# Patient Record
Sex: Male | Born: 1985 | Race: White | Hispanic: No | Marital: Married | Smoking: Current every day smoker
Health system: Southern US, Community
[De-identification: ages and names within clinical notes are randomized; demographics above are authoritative.]

## PROBLEM LIST (undated history)

## (undated) DIAGNOSIS — K219 Gastro-esophageal reflux disease without esophagitis: Secondary | ICD-10-CM

## (undated) DIAGNOSIS — F259 Schizoaffective disorder, unspecified: Secondary | ICD-10-CM

## (undated) DIAGNOSIS — F319 Bipolar disorder, unspecified: Secondary | ICD-10-CM

## (undated) DIAGNOSIS — I10 Essential (primary) hypertension: Secondary | ICD-10-CM

## (undated) DIAGNOSIS — Z950 Presence of cardiac pacemaker: Secondary | ICD-10-CM

## (undated) DIAGNOSIS — R001 Bradycardia, unspecified: Secondary | ICD-10-CM

## (undated) HISTORY — PX: INSERT / REPLACE / REMOVE PACEMAKER: SUR710

## (undated) HISTORY — PX: KNEE ARTHROSCOPY: SUR90

---
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
## 2006-07-08 ENCOUNTER — Emergency Department: Payer: Self-pay | Admitting: General Practice

## 2007-01-21 ENCOUNTER — Emergency Department: Payer: Self-pay | Admitting: Emergency Medicine

## 2007-01-23 ENCOUNTER — Emergency Department: Payer: Self-pay | Admitting: Emergency Medicine

## 2008-02-14 ENCOUNTER — Emergency Department: Payer: Self-pay | Admitting: Emergency Medicine

## 2009-10-30 ENCOUNTER — Emergency Department: Payer: Self-pay | Admitting: Emergency Medicine

## 2010-05-15 ENCOUNTER — Emergency Department: Payer: Self-pay | Admitting: Emergency Medicine

## 2010-07-05 ENCOUNTER — Emergency Department: Payer: Self-pay | Admitting: Emergency Medicine

## 2010-07-31 ENCOUNTER — Emergency Department: Payer: Self-pay | Admitting: Emergency Medicine

## 2010-08-10 ENCOUNTER — Emergency Department: Payer: Self-pay | Admitting: Emergency Medicine

## 2010-08-11 ENCOUNTER — Emergency Department: Payer: Self-pay | Admitting: Emergency Medicine

## 2010-08-14 ENCOUNTER — Observation Stay: Payer: Self-pay | Admitting: Internal Medicine

## 2010-11-11 ENCOUNTER — Ambulatory Visit: Payer: Self-pay | Admitting: Internal Medicine

## 2010-12-18 ENCOUNTER — Emergency Department: Payer: Self-pay | Admitting: Emergency Medicine

## 2010-12-21 ENCOUNTER — Emergency Department: Payer: Self-pay | Admitting: Emergency Medicine

## 2011-02-04 ENCOUNTER — Emergency Department: Payer: Self-pay | Admitting: Emergency Medicine

## 2011-02-24 ENCOUNTER — Emergency Department: Payer: Self-pay | Admitting: Internal Medicine

## 2011-02-26 ENCOUNTER — Emergency Department: Payer: Self-pay | Admitting: Emergency Medicine

## 2011-03-03 ENCOUNTER — Emergency Department: Payer: Self-pay | Admitting: Emergency Medicine

## 2011-06-06 ENCOUNTER — Emergency Department: Payer: Self-pay | Admitting: Emergency Medicine

## 2011-06-12 ENCOUNTER — Emergency Department: Payer: Self-pay | Admitting: Emergency Medicine

## 2011-10-04 ENCOUNTER — Emergency Department: Payer: Self-pay | Admitting: Emergency Medicine

## 2011-10-04 LAB — URINALYSIS, COMPLETE
Bacteria: NONE SEEN
Bilirubin,UR: NEGATIVE
Blood: NEGATIVE
Glucose,UR: NEGATIVE mg/dL (ref 0–75)
Ketone: NEGATIVE
Leukocyte Esterase: NEGATIVE
Nitrite: NEGATIVE
Ph: 6 (ref 4.5–8.0)
Protein: NEGATIVE
RBC,UR: NONE SEEN /HPF (ref 0–5)
Specific Gravity: 1.023 (ref 1.003–1.030)
Squamous Epithelial: NONE SEEN
WBC UR: <1 /HPF (ref 0–5)

## 2011-10-07 ENCOUNTER — Emergency Department: Payer: Self-pay | Admitting: Emergency Medicine

## 2011-10-07 LAB — BASIC METABOLIC PANEL
Anion Gap: 12 (ref 7–16)
Calcium, Total: 8.9 mg/dL (ref 8.5–10.1)
Creatinine: 0.92 mg/dL (ref 0.60–1.30)
EGFR (African American): >60
EGFR (Non-African Amer.): >60
Glucose: 93 mg/dL (ref 65–99)
Potassium: 4 mmol/L (ref 3.5–5.1)
Sodium: 144 mmol/L (ref 136–145)

## 2011-10-07 LAB — CBC
HCT: 45.7 % (ref 40.0–52.0)
HGB: 15.5 g/dL (ref 13.0–18.0)
MCHC: 34 g/dL (ref 32.0–36.0)
Platelet: 214 10*3/uL (ref 150–440)
RBC: 5.07 10*6/uL (ref 4.40–5.90)
RDW: 13.4 % (ref 11.5–14.5)
WBC: 12.6 10*3/uL — ABNORMAL HIGH (ref 3.8–10.6)

## 2012-10-05 ENCOUNTER — Emergency Department: Payer: Self-pay | Admitting: Emergency Medicine

## 2012-10-05 LAB — CBC WITH DIFFERENTIAL/PLATELET
Basophil #: 0.1 10*3/uL (ref 0.0–0.1)
Basophil %: 0.5 %
Eosinophil #: 0.5 10*3/uL (ref 0.0–0.7)
HCT: 44.6 % (ref 40.0–52.0)
Lymphocyte #: 2.2 10*3/uL (ref 1.0–3.6)
Lymphocyte %: 17.9 %
MCH: 30.1 pg (ref 26.0–34.0)
MCHC: 34 g/dL (ref 32.0–36.0)
Monocyte %: 3.6 %
RBC: 5.03 10*6/uL (ref 4.40–5.90)
WBC: 12.3 10*3/uL — ABNORMAL HIGH (ref 3.8–10.6)

## 2012-10-05 LAB — BASIC METABOLIC PANEL
Anion Gap: 6 — ABNORMAL LOW (ref 7–16)
Calcium, Total: 8.8 mg/dL (ref 8.5–10.1)
Co2: 26 mmol/L (ref 21–32)
Creatinine: 0.97 mg/dL (ref 0.60–1.30)
EGFR (African American): >60
EGFR (Non-African Amer.): >60
Glucose: 107 mg/dL — ABNORMAL HIGH (ref 65–99)
Osmolality: 279 (ref 275–301)
Potassium: 3.4 mmol/L — ABNORMAL LOW (ref 3.5–5.1)
Sodium: 139 mmol/L (ref 136–145)

## 2013-04-01 ENCOUNTER — Emergency Department: Payer: Self-pay | Admitting: Emergency Medicine

## 2013-04-01 LAB — BASIC METABOLIC PANEL
Anion Gap: 8 (ref 7–16)
BUN: 11 mg/dL (ref 7–18)
Chloride: 107 mmol/L (ref 98–107)
Co2: 24 mmol/L (ref 21–32)
EGFR (African American): >60
Glucose: 105 mg/dL — ABNORMAL HIGH (ref 65–99)
Osmolality: 277 (ref 275–301)
Potassium: 3.7 mmol/L (ref 3.5–5.1)

## 2013-04-01 LAB — CBC
HGB: 15.1 g/dL (ref 13.0–18.0)
MCH: 30.2 pg (ref 26.0–34.0)
MCHC: 34.4 g/dL (ref 32.0–36.0)
MCV: 88 fL (ref 80–100)
Platelet: 251 10*3/uL (ref 150–440)
RBC: 4.99 10*6/uL (ref 4.40–5.90)
WBC: 11.7 10*3/uL — ABNORMAL HIGH (ref 3.8–10.6)

## 2013-04-01 LAB — TROPONIN I: Troponin-I: <0.02 ng/mL

## 2013-10-09 ENCOUNTER — Emergency Department: Payer: Self-pay | Admitting: Emergency Medicine

## 2014-04-30 ENCOUNTER — Emergency Department: Payer: Self-pay | Admitting: Emergency Medicine

## 2014-07-02 ENCOUNTER — Emergency Department: Payer: Self-pay | Admitting: Emergency Medicine

## 2014-12-29 DIAGNOSIS — H109 Unspecified conjunctivitis: Secondary | ICD-10-CM | POA: Insufficient documentation

## 2014-12-29 DIAGNOSIS — Z880 Allergy status to penicillin: Secondary | ICD-10-CM | POA: Insufficient documentation

## 2014-12-29 NOTE — ED Notes (Signed)
Pt presents to er stating right eye pain, denies injury. Pt has redness and tearing noted, denies blurred vision.

## 2014-12-30 ENCOUNTER — Emergency Department
Admission: EM | Admit: 2014-12-30 | Discharge: 2014-12-30 | Disposition: A | Discharge status: Home / Self Care | Payer: Self-pay | Attending: Emergency Medicine | Admitting: Emergency Medicine

## 2014-12-30 DIAGNOSIS — H109 Unspecified conjunctivitis: Secondary | ICD-10-CM

## 2014-12-30 MED ORDER — IBUPROFEN 800 MG PO TABS
800.0000 mg | ORAL_TABLET | Freq: Once | ORAL | Status: AC
  Administered 2014-12-30: 800 mg via ORAL

## 2014-12-30 MED ORDER — TETRACAINE HCL 0.5 % OP SOLN
OPHTHALMIC | Status: AC
  Administered 2014-12-30: 2 [drp] via OPHTHALMIC
  Filled 2014-12-30: qty 2

## 2014-12-30 MED ORDER — IBUPROFEN 800 MG PO TABS
ORAL_TABLET | ORAL | Status: AC
  Administered 2014-12-30: 800 mg via ORAL
  Filled 2014-12-30: qty 1

## 2014-12-30 MED ORDER — OXYCODONE-ACETAMINOPHEN 5-325 MG PO TABS
ORAL_TABLET | ORAL | Status: AC
  Administered 2014-12-30: 1 via ORAL
  Filled 2014-12-30: qty 1

## 2014-12-30 MED ORDER — FLUORESCEIN SODIUM 1 MG OP STRP
1.0000 | ORAL_STRIP | Freq: Once | OPHTHALMIC | Status: AC
  Administered 2014-12-30: 1 via OPHTHALMIC

## 2014-12-30 MED ORDER — IBUPROFEN 800 MG PO TABS: 800.0000 mg | ORAL_TABLET | Freq: Three times a day (TID) | ORAL | Status: DC | PRN

## 2014-12-30 MED ORDER — TOBRAMYCIN 0.3 % OP SOLN
2.0000 [drp] | OPHTHALMIC | Status: DC
  Administered 2014-12-30: 2 [drp] via OPHTHALMIC
  Filled 2014-12-30: qty 5

## 2014-12-30 MED ORDER — TETRACAINE HCL 0.5 % OP SOLN
2.0000 [drp] | Freq: Once | OPHTHALMIC | Status: AC
  Administered 2014-12-30: 2 [drp] via OPHTHALMIC

## 2014-12-30 MED ORDER — FLUORESCEIN SODIUM 1 MG OP STRP
ORAL_STRIP | OPHTHALMIC | Status: AC
  Administered 2014-12-30: 1 via OPHTHALMIC
  Filled 2014-12-30: qty 2

## 2014-12-30 MED ORDER — OXYCODONE-ACETAMINOPHEN 5-325 MG PO TABS: 1.0000 | ORAL_TABLET | ORAL | Status: DC | PRN

## 2014-12-30 MED ORDER — OXYCODONE-ACETAMINOPHEN 5-325 MG PO TABS
1.0000 | ORAL_TABLET | Freq: Once | ORAL | Status: AC
  Administered 2014-12-30: 1 via ORAL

## 2014-12-30 NOTE — Discharge Instructions (Signed)
1. Apply antibiotic eye drops to right eye 2 drops every 4 hours while awake 7 days. 2. Take pain medicines as needed (Motrin/Percocet). 3. Return to the ER for worsening symptoms, persistent vomiting, fever, increased eye drainage or other concerns.  Conjunctivitis Conjunctivitis is commonly called "pink eye." Conjunctivitis can be caused by bacterial or viral infection, allergies, or injuries. There is usually redness of the lining of the eye, itching, discomfort, and sometimes discharge. There may be deposits of matter along the eyelids. A viral infection usually causes a watery discharge, while a bacterial infection causes a yellowish, thick discharge. Pink eye is very contagious and spreads by direct contact. You may be given antibiotic eyedrops as part of your treatment. Before using your eye medicine, remove all drainage from the eye by washing gently with warm water and cotton balls. Continue to use the medication until you have awakened 2 mornings in a row without discharge from the eye. Do not rub your eye. This increases the irritation and helps spread infection. Use separate towels from other household members. Wash your hands with soap and water before and after touching your eyes. Use cold compresses to reduce pain and sunglasses to relieve irritation from light. Do not wear contact lenses or wear eye makeup until the infection is gone. SEEK MEDICAL CARE IF:   Your symptoms are not better after 3 days of treatment.  You have increased pain or trouble seeing.  The outer eyelids become very red or swollen. Document Released: 08/26/2004 Document Revised: 10/11/2011 Document Reviewed: 07/19/2005 [facility] Patient Information 2015 [place] [employer]. This information is not intended to replace advice given to you by your health care provider. Make sure you discuss any questions you have with your health care provider.  Eye Drops Use eye drops as directed. It may be easier to have someone help  you put the drops in your eye. If you are alone, use the following instructions to help you.  Wash your hands before putting drops in your eyes.  Read the label and look at your medication. Check for any expiration date that may appear on the bottle or tube. Changes of color may be a warning that the medication is old or ineffective. This is especially true if the medication has become brown in color. If you have questions or concerns, call your caregiver. DROPS  Tilt your head back with the affected eye uppermost. Gently pull down on your lower lid. Do not pull up on the upper lid.  Look up. Place the dropper or bottle just over the edge of the lower lid near the white portion at the bottom of the eye. The goal is to have the drop go into the little sac formed by the lower lid and the bottom of the eye itself. Do not release the drop from a height of several inches over the eye. That will only serve to startle the person receiving the medicine when it lands and forces a blink.  Steady your hand in a comfortable manner. An example would be to hold the dropper or bottle between your thumb and index (pointing) finger. Lean your index finger against the brow.  Then, slowly and gently squeeze one drop of medication into your eye.  Once the medication has been applied, place your finger between the lower eyelid and the nose, pressing firmly against the nose for 5-10 seconds. This will slow the process of the eye drop entering the small canal that normally drains tears into the nose, and  therefore increases the exposure of the medicine to the eye for a few extra seconds. OINTMENTS  Look up. Place the tip of the tube just over the edge of the lower lid near the white portion at the bottom of the eye. The goal is to create a line of ointment along the inner surface of the eyelid in the little sac formed by the lower lid and the bottom of the eye itself.  Avoid touching the tube tip to your eyeball or  eyelid. This avoids contamination of the tube or the medicine in the tube.  Once a line of medicine has been created, hold the upper lid up and look down before releasing the upper lid. This will force the ointment to spread over the surface of the eye.  Your vision will be very blurry for a few minutes after applying an ointment properly. This is normal and will clear as you continue to blink. For this reason, it is best to apply ointments just before going to sleep, or at a time when you can rest your eyes for 5-10 minutes after applying the medication. GENERAL  Store your medicine in a cool, dry place after each use.  If you need a second medication, wait at least two minutes. This helps the first medication to be taken up (absorbed) by the eye.  If you have been instructed to use both an eye drop and an eye ointment, always apply the drop first and then the ointment 3-4 minutes afterward. Never put medications into the eye unless the label reads, "For Ophthalmic Use," "For Use In Eyes" or "Eye Drops." If you have questions, call your caregiver. Document Released: 10/25/2000 Document Revised: 12/03/2013 Document Reviewed: 12/31/2008 [facility] Patient Information 2015 [place], [place]. This information is not intended to replace advice given to you by your health care provider. Make sure you discuss any questions you have with your health care provider.

## 2014-12-30 NOTE — ED Provider Notes (Signed)
[facility] Emergency Department Provider Note  ____________________________________________  Time seen: Approximately 5:36 AM  I have reviewed the triage vital signs and the nursing notes.   HISTORY  Chief Complaint Eye Pain    HPI Corey [name] is a 29 y.o. male who presents with a 3 day history of right eye pain. Patient describes 8/10 right eye redness, matting and irritation. Patient denies trauma/injury. Patient is not a Psychologist, occupationalwelder. Patient does not wear corrective lenses. No sick contacts. Nothing makes the pain better or worse.   Past medical history No history of diabetes   There are no active problems to display for this patient.   No past surgical history on file.  Medications None  Allergies Penicillins and Sulfa antibiotics  No family history on file.  Social History History  Substance Use Topics  . Smoking status: Not on file  . Smokeless tobacco: Not on file  . Alcohol Use: Not on file   Smoker  Review of Systems Constitutional: No fever/chills Eyes: Positive for right eye irritation and matting. ENT: No sore throat. Cardiovascular: Denies chest pain. Respiratory: Denies shortness of breath. Gastrointestinal: No abdominal pain.  No nausea, no vomiting.  No diarrhea.  No constipation. Genitourinary: Negative for dysuria. Musculoskeletal: Negative for back pain. Skin: Negative for rash. Neurological: Negative for headaches, focal weakness or numbness.  10-point ROS otherwise negative.  ____________________________________________   PHYSICAL EXAM:  VITAL SIGNS: ED Triage Vitals  Enc Vitals Group     BP 12/29/14 2249 120/74 mmHg     Pulse Rate 12/29/14 2249 83     Resp 12/29/14 2249 18     Temp 12/29/14 2249 98 F (36.7 C)     Temp Source 12/29/14 2249 Oral     SpO2 12/29/14 2249 98 %     Weight 12/29/14 2249 250 lb (113.399 kg)     Height 12/29/14 2249 5\' 11"  (1.803 m)     Head Cir --      Peak Flow --    Pain Score 12/29/14 2254 4     Pain Loc --      Pain Edu? --      Excl. in GC? --     Constitutional: Alert and oriented. Well appearing and in no acute distress. Eyes: Right conjunctiva redness with tearing. PERRL. EOMI. Visual acuity 20/40 both eyes. No globe trauma. Right eyelid everted for exam without foreign body noted. Right cornea examined with fluorescein after tetracaine applied: No abrasion or fluorescein dye uptake noted. Anterior chambers within normal limits. Normal funduscopic exam. Head: Atraumatic. Nose: No congestion/rhinnorhea. Mouth/Throat: Mucous membranes are moist.  Oropharynx non-erythematous. Neck: No stridor.   Cardiovascular: Normal rate, regular rhythm. Grossly normal heart sounds.  Good peripheral circulation. Respiratory: Normal respiratory effort.  No retractions. Lungs CTAB. Gastrointestinal: Soft and nontender. No distention. No abdominal bruits. No CVA tenderness. Musculoskeletal: No lower extremity tenderness nor edema.  No joint effusions. Neurologic:  Normal speech and language. No gross focal neurologic deficits are appreciated. Speech is normal. No gait instability. Skin:  Skin is warm, dry and intact. No rash noted. Psychiatric: Mood and affect are normal. Speech and behavior are normal.  ____________________________________________   LABS (all labs ordered are listed, but only abnormal results are displayed)  Labs Reviewed - No data to display ____________________________________________  EKG  None ____________________________________________  RADIOLOGY  None ____________________________________________   PROCEDURES  Procedure(s) performed: None  Critical Care performed: No  ____________________________________________   INITIAL IMPRESSION / ASSESSMENT AND  PLAN / ED COURSE  Pertinent labs & imaging results that were available during my care of the patient were reviewed by me and considered in my medical decision making (see  chart for details).  29 year old male with right conjunctivitis. Plan for Tobrex antibiotic eyedrops and analgesia. Close follow up with ophthalmology. Strict return precautions given. Patient verbalizes understanding and agrees with plan of care. ____________________________________________   FINAL CLINICAL IMPRESSION(S) / ED DIAGNOSES  Final diagnoses:  Conjunctivitis of right eye      Irean Hong, MD 12/30/14 [phone]

## 2015-04-16 ENCOUNTER — Encounter (INDEPENDENT_AMBULATORY_CARE_PROVIDER_SITE_OTHER): Payer: Self-pay

## 2015-04-16 ENCOUNTER — Ambulatory Visit: Payer: Self-pay

## 2015-04-21 ENCOUNTER — Ambulatory Visit: Payer: Self-pay

## 2015-12-23 ENCOUNTER — Encounter: Payer: Self-pay | Admitting: Emergency Medicine

## 2015-12-23 ENCOUNTER — Emergency Department
Admission: EM | Admit: 2015-12-23 | Discharge: 2015-12-23 | Disposition: A | Discharge status: Home / Self Care | Payer: Self-pay | Attending: Emergency Medicine | Admitting: Emergency Medicine

## 2015-12-23 ENCOUNTER — Emergency Department: Payer: Self-pay

## 2015-12-23 DIAGNOSIS — Y939 Activity, unspecified: Secondary | ICD-10-CM | POA: Insufficient documentation

## 2015-12-23 DIAGNOSIS — Z87891 Personal history of nicotine dependence: Secondary | ICD-10-CM | POA: Insufficient documentation

## 2015-12-23 DIAGNOSIS — Y999 Unspecified external cause status: Secondary | ICD-10-CM | POA: Insufficient documentation

## 2015-12-23 DIAGNOSIS — W010XXA Fall on same level from slipping, tripping and stumbling without subsequent striking against object, initial encounter: Secondary | ICD-10-CM | POA: Insufficient documentation

## 2015-12-23 DIAGNOSIS — Y9289 Other specified places as the place of occurrence of the external cause: Secondary | ICD-10-CM | POA: Insufficient documentation

## 2015-12-23 DIAGNOSIS — F319 Bipolar disorder, unspecified: Secondary | ICD-10-CM | POA: Insufficient documentation

## 2015-12-23 DIAGNOSIS — S300XXA Contusion of lower back and pelvis, initial encounter: Secondary | ICD-10-CM | POA: Insufficient documentation

## 2015-12-23 HISTORY — DX: Bipolar disorder, unspecified: F31.9

## 2015-12-23 NOTE — ED Provider Notes (Signed)
[facility] Emergency Department Provider Note  ____________________________________________  Time seen: Approximately 5:50 AM  I have reviewed the triage vital signs and the nursing notes.   HISTORY  Chief Complaint Tailbone Pain    HPI Corey [name] is a 30 y.o. male with a prior history of a tailbone fracture that occurred about 2 years ago.  He presents by private vehicle to the emergency department for evaluation of tailbone pain after having a mechanical fall tonight on his porch.  He reports that his foot just went out from under him and he landed on his tailbone and is concerned he may have reinjured it.  He describes the pain as moderate at rest and severe with any amount of movement or pressure.  He did not hit his head or sustain any other injuries.The injury occurred acutely.   Past Medical History  Diagnosis Date  . Bipolar 1 disorder (HCC)     There are no active problems to display for this patient.   Past Surgical History  Procedure Laterality Date  . Knee arthroscopy      Current Outpatient Rx  Name  Route  Sig  Dispense  Refill  . ibuprofen (ADVIL,MOTRIN) 800 MG tablet   Oral   Take 1 tablet (800 mg total) by mouth every 8 (eight) hours as needed for moderate pain.   15 tablet   0   . oxyCODONE-acetaminophen (ROXICET) 5-325 MG per tablet   Oral   Take 1 tablet by mouth every 4 (four) hours as needed for severe pain.   10 tablet   0     Allergies Penicillins and Sulfa antibiotics  No family history on file.  Social History Social History  Substance Use Topics  . Smoking status: Former Games developer  . Smokeless tobacco: None  . Alcohol Use: None    Review of Systems Constitutional: No fever/chills Cardiovascular: Denies chest pain. Respiratory: Denies shortness of breath. Gastrointestinal: No abdominal pain.  No nausea, no vomiting.  No diarrhea.  No constipation. Genitourinary: Negative for  dysuria. Musculoskeletal: Pain in tailbone. Skin: Negative for rash. Neurological: Negative for headaches, focal weakness or numbness.  10-point ROS otherwise negative.  ____________________________________________   PHYSICAL EXAM:  VITAL SIGNS: ED Triage Vitals  Enc Vitals Group     BP 12/23/15 0135 173/100 mmHg     Pulse Rate 12/23/15 0135 100     Resp 12/23/15 0135 20     Temp 12/23/15 0135 97.9 F (36.6 C)     Temp Source 12/23/15 0135 Oral     SpO2 12/23/15 0135 98 %     Weight 12/23/15 0135 264 lb (119.75 kg)     Height 12/23/15 0135  (1.803 m)     Head Cir --      Peak Flow --      Pain Score 12/23/15 0134 8     Pain Loc --      Pain Edu? --      Excl. in GC? --     Constitutional: Alert and oriented. Well appearing and in no acute distress. Eyes: Conjunctivae are normal. PERRL. EOMI. Head: Atraumatic. Neck: No stridor.  No meningeal signs.  No cervical spine tenderness to palpation. Cardiovascular: Normal rate, regular rhythm. Good peripheral circulation.  Respiratory: Normal respiratory effort.  No retractions.  Gastrointestinal: Soft and nontender. No distention.  Musculoskeletal: No lower extremity tenderness nor edema. No gross deformities of extremities. Tender to palpation of the sacrum but without obvious deformity  including no hematoma nor ecchymosis Neurologic:  Normal speech and language. No gross focal neurologic deficits are appreciated.  Skin:  Skin is warm, dry and intact. No rash noted. Psychiatric: Mood and affect are normal. Speech and behavior are normal.  ____________________________________________   LABS (all labs ordered are listed, but only abnormal results are displayed)  Labs Reviewed - No data to display ____________________________________________  EKG  None ____________________________________________  RADIOLOGY   Dg Sacrum/coccyx  12/23/2015  CLINICAL DATA:  Acute onset of coccygeal pain after fall. Initial  encounter. EXAM: SACRUM AND COCCYX - 2+ VIEW COMPARISON:  Sacrococcygeal radiographs performed 04/30/2014 FINDINGS: There is no evidence of acute fracture or dislocation. Slight deformity at the coccyx reflects prior injury. There is partial sacralization of vertebral body L5 on the right. The sacroiliac joints are grossly unremarkable. The visualized portion of the hips are within normal limits. IMPRESSION: No evidence of acute fracture or dislocation. Slight deformity of the coccyx reflects prior injury. Electronically Signed   By: Roanna RaiderJeffery  Chang M.D.   On: 12/23/2015 04:02    ____________________________________________   PROCEDURES  Procedure(s) performed: None  Critical Care performed: No ____________________________________________   INITIAL IMPRESSION / ASSESSMENT AND PLAN / ED COURSE  Pertinent labs & imaging results that were available during my care of the patient were reviewed by me and considered in my medical decision making (see chart for details).  Reassuring radiographs, and patient was relieved to learn no acute fracture.  NSAIDs & Tylenol, outpatient f/u.  Patient agrees with plan.   ____________________________________________  FINAL CLINICAL IMPRESSION(S) / ED DIAGNOSES  Final diagnoses:  Contusion of coccyx, initial encounter     MEDICATIONS GIVEN DURING THIS VISIT:  Medications - No data to display   NEW OUTPATIENT MEDICATIONS STARTED DURING THIS VISIT:  New Prescriptions   No medications on file      Note:  This document was prepared using Dragon voice recognition software and may include unintentional dictation errors.   Loleta Roseory Zenaida Tesar, MD 12/23/15 [phone]

## 2015-12-23 NOTE — Discharge Instructions (Signed)
Tailbone Injury °The tailbone (coccyx) is the small bone at the lower end of the spine. A tailbone injury may involve stretched ligaments, bruising, or a broken bone (fracture). Tailbone injuries can be painful, and some may take a long time to heal. °CAUSES °This condition is often caused by falling and landing on the tailbone. Other causes include: °· Repeated strain or friction from actions such as rowing and bicycling. °· Childbirth. °In some cases, the cause may not be known. °RISK FACTORS °This condition is more common in women than in men. °SYMPTOMS °Symptoms of this condition include: °· Pain in the lower back, especially when sitting. °· Pain or difficulty when standing up from a sitting position. °· Bruising in the tailbone area. °· Painful bowel movements. °· In women, pain during intercourse. °DIAGNOSIS °This condition may be diagnosed based on your symptoms and a physical exam. X-rays may be taken if a fracture is suspected. You may also have other tests, such as a CT scan or MRI. °TREATMENT °This condition may be treated with medicines to help relieve your pain. Most tailbone injuries heal on their own in 4-6 weeks. However, recovery time may be longer if the injury involves a fracture. °HOME CARE INSTRUCTIONS °· Take medicines only as directed by your health care provider. °· If directed, apply ice to the injured area: °¨ Put ice in a plastic bag. °¨ Place a towel between your skin and the bag. °¨ Leave the ice on for 20 minutes, 2-3 times per day for the first 1-2 days. °· Sit on a large, rubber or inflated ring or cushion to ease your pain. Lean forward when you are sitting to help decrease discomfort. °· Avoid sitting for long periods of time. °· Increase your activity as the pain allows. Perform any exercises that are recommended by your health care provider or physical therapist. °· If you have pain during bowel movements, use stool softeners as directed by your health care provider. °· Eat a  diet that includes plenty of fiber to help prevent constipation. °· Keep all follow-up visits as directed by your health care provider. This is important. °PREVENTION °Wear appropriate padding and sports gear when bicycling and rowing. This can help to prevent developing an injury that is caused by repeated strain or friction. °SEEK MEDICAL CARE IF: °· Your pain becomes worse. °· Your bowel movements cause a great deal of discomfort. °· You are unable to have a bowel movement. °· You have uncontrolled urine loss (urinary incontinence). °· You have a fever. °  °This information is not intended to replace advice given to you by your health care provider. Make sure you discuss any questions you have with your health care provider. °  °Document Released: 07/16/2000 Document Revised: 12/03/2014 Document Reviewed: 07/15/2014 °[employer] Interactive Patient Education ©2016 [employer] ° °

## 2015-12-23 NOTE — ED Notes (Signed)
Patient ambulatory to triage with steady gait, without difficulty or distress noted; pt reports "slipped" and fell at 1030pm landing on buttocks; st "just want to make sure I didn't rebreak my tailbone"

## 2015-12-23 NOTE — ED Notes (Signed)
Pt states he slipped on his porch and landed on his bottom, wants to see if he broke his tailbone again. Broke it Oct 2015.

## 2017-02-01 ENCOUNTER — Encounter: Payer: Self-pay | Admitting: *Deleted

## 2017-02-01 ENCOUNTER — Emergency Department
Admission: EM | Admit: 2017-02-01 | Discharge: 2017-02-01 | Disposition: A | Discharge status: Home / Self Care | Payer: Self-pay | Attending: Emergency Medicine | Admitting: Emergency Medicine

## 2017-02-01 ENCOUNTER — Emergency Department: Payer: Self-pay

## 2017-02-01 DIAGNOSIS — Z87891 Personal history of nicotine dependence: Secondary | ICD-10-CM | POA: Insufficient documentation

## 2017-02-01 DIAGNOSIS — I889 Nonspecific lymphadenitis, unspecified: Secondary | ICD-10-CM | POA: Insufficient documentation

## 2017-02-01 LAB — COMPREHENSIVE METABOLIC PANEL
ALT: 22 U/L (ref 17–63)
AST: 23 U/L (ref 15–41)
Albumin: 3.9 g/dL (ref 3.5–5.0)
Alkaline Phosphatase: 72 U/L (ref 38–126)
Anion gap: 5 (ref 5–15)
BUN: 12 mg/dL (ref 6–20)
CO2: 26 mmol/L (ref 22–32)
Calcium: 8.6 mg/dL — ABNORMAL LOW (ref 8.9–10.3)
Chloride: 107 mmol/L (ref 101–111)
Creatinine, Ser: 1.02 mg/dL (ref 0.61–1.24)
GFR calc Af Amer: >60 mL/min (ref >60)
GFR calc non Af Amer: >60 mL/min (ref >60)
Glucose, Bld: 101 mg/dL — ABNORMAL HIGH (ref 65–99)
Potassium: 3.4 mmol/L — ABNORMAL LOW (ref 3.5–5.1)
Sodium: 138 mmol/L (ref 135–145)
Total Bilirubin: 0.5 mg/dL (ref 0.3–1.2)
Total Protein: 7.2 g/dL (ref 6.5–8.1)

## 2017-02-01 LAB — CBC WITH DIFFERENTIAL/PLATELET
Basophils Absolute: 0.1 10*3/uL (ref 0–0.1)
Basophils Relative: 1 %
Eosinophils Absolute: 1 10*3/uL — ABNORMAL HIGH (ref 0–0.7)
Eosinophils Relative: 8 %
HCT: 44.1 % (ref 40.0–52.0)
Hemoglobin: 15.1 g/dL (ref 13.0–18.0)
Lymphocytes Relative: 21 %
Lymphs Abs: 2.6 10*3/uL (ref 1.0–3.6)
MCH: 29.8 pg (ref 26.0–34.0)
MCHC: 34.2 g/dL (ref 32.0–36.0)
MCV: 87.1 fL (ref 80.0–100.0)
Monocytes Absolute: 0.6 10*3/uL (ref 0.2–1.0)
Monocytes Relative: 5 %
Neutro Abs: 8 10*3/uL — ABNORMAL HIGH (ref 1.4–6.5)
Neutrophils Relative %: 65 %
Platelets: 261 10*3/uL (ref 150–440)
RBC: 5.06 MIL/uL (ref 4.40–5.90)
RDW: 13.4 % (ref 11.5–14.5)
WBC: 12.3 10*3/uL — ABNORMAL HIGH (ref 3.8–10.6)

## 2017-02-01 MED ORDER — CLINDAMYCIN HCL 300 MG PO CAPS: 300.0000 mg | ORAL_CAPSULE | Freq: Three times a day (TID) | ORAL | 0 refills | Status: AC

## 2017-02-01 MED ORDER — IOPAMIDOL (ISOVUE-300) INJECTION 61%
75.0000 mL | Freq: Once | INTRAVENOUS | Status: AC | PRN
  Administered 2017-02-01: 75 mL via INTRAVENOUS
  Filled 2017-02-01: qty 75

## 2017-02-01 NOTE — ED Provider Notes (Signed)
[facility] Emergency Department Provider Note  ____________________________________________  Time seen: Approximately 8:17 PM  I have reviewed the triage vital signs and the nursing notes.   HISTORY  Chief Complaint Abscess    HPI Corey [name] is a 31 y.o. male presenting to the emergency department with a 1 cm x 1 cm right postauricular mass that patient has noticed gradually enlarging over the past 6 months. Patient denies fever, chills, recent illness, weight loss or weight gain or personal history of malignancy. He has been afebrile. Patient states that mass is nontender to palpation. 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 No alleviating measures have been attempted.    Past Medical History:  Diagnosis Date  . Bipolar 1 disorder (HCC)     There are no active problems to display for this patient.   Past Surgical History:  Procedure Laterality Date  . KNEE ARTHROSCOPY      Prior to Admission medications   Medication Sig Start Date End Date Taking? Authorizing Provider  clindamycin (CLEOCIN) 300 MG capsule Take 1 capsule (300 mg total) by mouth 3 (three) times daily. 02/01/17 02/11/17  Orvil Feil, PA-C  ibuprofen (ADVIL,MOTRIN) 800 MG tablet Take 1 tablet (800 mg total) by mouth every 8 (eight) hours as needed for moderate pain. 12/30/14   Irean Hong, MD  oxyCODONE-acetaminophen (ROXICET) 5-325 MG per tablet Take 1 tablet by mouth every 4 (four) hours as needed for severe pain. 12/30/14   Irean Hong, MD    Allergies Penicillins and Sulfa antibiotics  No family history on file.  Social History Social History  Substance Use Topics  . Smoking status: Former Games developer  . Smokeless tobacco: Current User  . Alcohol use No     Review of Systems  Constitutional: No fever/chills Eyes: No visual changes. No discharge ENT: No upper respiratory complaints. Cardiovascular: no chest pain. Respiratory: no cough. No  SOB. Gastrointestinal: No abdominal pain.  No nausea, no vomiting.  No diarrhea.  No constipation. Musculoskeletal: Negative for musculoskeletal pain. Skin:Patient has a 1cm x 1cm right post auricular cyst.  Neurological: Negative for headaches, focal weakness or numbness.   ____________________________________________   PHYSICAL EXAM:  VITAL SIGNS: ED Triage Vitals [02/01/17 1929]  Enc Vitals Group     BP 135/78     Pulse Rate 89     Resp 20     Temp 98.1 F (36.7 C)     Temp Source Oral     SpO2 97 %     Weight 250 lb (113.4 kg)     Height 5\' 11"  (1.803 m)     Head Circumference      Peak Flow      Pain Score      Pain Loc      Pain Edu?      Excl. in GC?    Constitutional: Alert and oriented. Well appearing and in no acute distress. Eyes: Conjunctivae are normal. PERRL. EOMI. Head: Atraumatic. ENT:      Ears: Tympanic membranes are pearly bilaterally.      Nose: No congestion/rhinnorhea.      Mouth/Throat: Mucous membranes are moist.  Neck: Full range of motion. Cardiovascular: Normal rate, regular rhythm. Normal S1 and S2.  Good peripheral circulation. Respiratory: Normal respiratory effort without tachypnea or retractions. Lungs CTAB. Good air entry to the bases with no decreased or absent breath sounds. Musculoskeletal: Full range of motion to all extremities. No gross deformities appreciated. Neurologic:  Normal speech and language. No gross focal neurologic deficits are appreciated.  Skin: Patient has a 1 cm x 1 cm right postauricular mass that is soft and movable  Psychiatric: Mood and affect are normal. Speech and behavior are normal. Patient exhibits appropriate insight and judgement.   ____________________________________________   LABS (all labs ordered are listed, but only abnormal results are displayed)  Labs Reviewed  CBC WITH DIFFERENTIAL/PLATELET - Abnormal; Notable for the following:       Result Value   WBC 12.3 (*)    Neutro Abs 8.0 (*)     Eosinophils Absolute 1.0 (*)    All other components within normal limits  COMPREHENSIVE METABOLIC PANEL - Abnormal; Notable for the following:    Potassium 3.4 (*)    Glucose, Bld 101 (*)    Calcium 8.6 (*)    All other components within normal limits   ____________________________________________  EKG   ____________________________________________  RADIOLOGY Geraldo Pitter, personally viewed and evaluated these images  as part of my medical decision making, as well as reviewing the written report by the radiologist.    Ct Maxillofacial W Contrast  Result Date: 02/01/2017 CLINICAL DATA:  RIGHT retroauricular mass for 6 months, slowly enlarging. EXAM: CT MAXILLOFACIAL WITH CONTRAST TECHNIQUE: Multidetector CT imaging of the maxillofacial structures was performed. Multiplanar CT image reconstructions were also generated. A small metallic BB was placed on the right temple in order to reliably differentiate right from left. COMPARISON:  MRI of the head August 14, 2010 FINDINGS: OSSEOUS: The mandible is intact, the condyles are located. No acute facial fracture. No destructive bony lesions. ORBITS: Ocular globes and orbital contents are normal. SINUSES: Paranasal sinuses are well aerated. Nasal septum is midline. Included mastoid aircells are well aerated. SOFT TISSUES: 10 x 4 mm reniform lymph node RIGHT posterior auricular superficial subcutaneous fat corresponding to palpable abnormality. Borderline upper cervical reactive lymphadenopathy. LIMITED INTRACRANIAL: Normal. IMPRESSION: 10 x 4 mm benign-appearing RIGHT posterior auricular lymph node corresponding to palpable abnormality. Electronically Signed   By: Awilda Metro M.D.   On: 02/01/2017 21:06    ____________________________________________    PROCEDURES  Procedure(s) performed:    Procedures    Medications  iopamidol (ISOVUE-300) 61 % injection 75 mL (75 mLs Intravenous Contrast Given 02/01/17 2051)      ____________________________________________   INITIAL IMPRESSION / ASSESSMENT AND PLAN / ED COURSE  Pertinent labs & imaging results that were available during my care of the patient were reviewed by me and considered in my medical decision making (see chart for details).  Review of the [place] CSRS was performed in accordance of the [facility] prior to dispensing any controlled drugs.    Assessment and plan: Lymphadenitis Patient presents to the emergency department with a 2 cm x 2 cm right postauricular mass. CT maxillofacial reveals a benign-appearing 10 mm x 4 mm right post auricular lymph node. Patient was discharged with Clindamycin for lymphadenitis. Patient was advised to follow-up with primary care as needed. Vital signs were reassuring prior to discharge. All patient questions were answered. ____________________________________________  FINAL CLINICAL IMPRESSION(S) / ED DIAGNOSES  Final diagnoses:  Lymphadenitis      NEW MEDICATIONS STARTED DURING THIS VISIT:  Discharge Medication List as of 02/01/2017  9:22 PM    START taking these medications   Details  clindamycin (CLEOCIN) 300 MG capsule Take 1 capsule (300 mg total) by mouth 3 (three) times daily., Starting Tue 02/01/2017, Until Fri 02/11/2017, Print  This chart was dictated using voice recognition software/Dragon. Despite best efforts to proofread, errors can occur which can change the meaning. Any change was purely unintentional.    Orvil FeilWoods, Orpah Hausner M, PA-C 02/01/17 2342    Jeanmarie PlantMcShane, James A, MD 02/01/17 [phone]

## 2017-02-01 NOTE — ED Notes (Signed)

## 2017-02-01 NOTE — ED Triage Notes (Signed)
Pt complains of a nontender bump behind right ear for the last 6 months, pt reports area has gotten larger, pt denies fever or any other symptoms

## 2017-11-08 ENCOUNTER — Other Ambulatory Visit: Payer: Self-pay

## 2017-11-08 ENCOUNTER — Emergency Department
Admission: EM | Admit: 2017-11-08 | Discharge: 2017-11-08 | Disposition: A | Discharge status: Home / Self Care | Payer: Self-pay | Attending: Emergency Medicine | Admitting: Emergency Medicine

## 2017-11-08 ENCOUNTER — Encounter: Payer: Self-pay | Admitting: Emergency Medicine

## 2017-11-08 DIAGNOSIS — W540XXA Bitten by dog, initial encounter: Secondary | ICD-10-CM | POA: Insufficient documentation

## 2017-11-08 DIAGNOSIS — Y9389 Activity, other specified: Secondary | ICD-10-CM | POA: Insufficient documentation

## 2017-11-08 DIAGNOSIS — Y999 Unspecified external cause status: Secondary | ICD-10-CM | POA: Insufficient documentation

## 2017-11-08 DIAGNOSIS — Z23 Encounter for immunization: Secondary | ICD-10-CM | POA: Insufficient documentation

## 2017-11-08 DIAGNOSIS — S41152A Open bite of left upper arm, initial encounter: Secondary | ICD-10-CM | POA: Insufficient documentation

## 2017-11-08 DIAGNOSIS — Z87891 Personal history of nicotine dependence: Secondary | ICD-10-CM | POA: Insufficient documentation

## 2017-11-08 DIAGNOSIS — Y929 Unspecified place or not applicable: Secondary | ICD-10-CM | POA: Insufficient documentation

## 2017-11-08 MED ORDER — DOXYCYCLINE HYCLATE 100 MG PO TABS: 100.0000 mg | ORAL_TABLET | Freq: Two times a day (BID) | ORAL | 0 refills | Status: DC

## 2017-11-08 MED ORDER — TETANUS-DIPHTH-ACELL PERTUSSIS 5-2.5-18.5 LF-MCG/0.5 IM SUSP
0.5000 mL | Freq: Once | INTRAMUSCULAR | Status: AC
  Administered 2017-11-08: 0.5 mL via INTRAMUSCULAR
  Filled 2017-11-08: qty 0.5

## 2017-11-08 NOTE — ED Notes (Signed)

## 2017-11-08 NOTE — ED Notes (Signed)
See triage note  Presents with dog bite/scratch to left upper arm  States dog was new to them  Bleeding controlled  [employer] at bedside

## 2017-11-08 NOTE — ED Triage Notes (Addendum)
PT to ED via POV with dog bite noted to left upper arm, no bleeding or broken skin noted. Pt has scratch noted, no teeth marks or indentions . Dog was a bassett hound and new to pt home. PT unaware if animal has received shots.  [employer] awre

## 2017-11-08 NOTE — ED Provider Notes (Signed)
[facility] Emergency Department Provider Note  ____________________________________________  Time seen: Approximately 6:45 PM  I have reviewed the triage vital signs and the nursing notes.   HISTORY  Chief Complaint Animal Bite    HPI Corey [name] is a 32 y.o. male who presents the emergency department status post dog bite to the left upper arm.  Patient reports that he had just gotten the dog today.  He was told that the animal was up to date on shots but did not receive any paperwork.  Patient reports that when he had gone outside, the dog slipped out.  He had to chase him down and when he did the dog bit him until realizing who it was.  Patient reports that he has superficial scratch marks to the left upper arm.  He reports that the dog is acting normal.  He does have some scratches from climbing over a barbed wire fence to get to the animal.  He is unsure of his last tetanus shot.  No other injury or complaint.  Past Medical History:  Diagnosis Date  . Bipolar 1 disorder (HCC)     There are no active problems to display for this patient.   Past Surgical History:  Procedure Laterality Date  . KNEE ARTHROSCOPY      Prior to Admission medications   Medication Sig Start Date End Date Taking? Authorizing Provider  doxycycline (VIBRA-TABS) 100 MG tablet Take 1 tablet (100 mg total) by mouth 2 (two) times daily. 11/08/17   Cuthriell, Delorise RoyalsJonathan D, PA-C  ibuprofen (ADVIL,MOTRIN) 800 MG tablet Take 1 tablet (800 mg total) by mouth every 8 (eight) hours as needed for moderate pain. 12/30/14   Irean HongSung, Jade J, MD  oxyCODONE-acetaminophen (ROXICET) 5-325 MG per tablet Take 1 tablet by mouth every 4 (four) hours as needed for severe pain. 12/30/14   Irean HongSung, Jade J, MD    Allergies Penicillins and Sulfa antibiotics  No family history on file.  Social History Social History   Tobacco Use  . Smoking status: Former Games developermoker  . Smokeless tobacco: Current User  Substance  Use Topics  . Alcohol use: No  . Drug use: Never     Review of Systems  Constitutional: No fever/chills Eyes: No visual changes.  Cardiovascular: no chest pain. Respiratory: no cough. No SOB. Gastrointestinal: No abdominal pain.  No nausea, no vomiting.  Musculoskeletal: Negative for musculoskeletal pain. Skin: Superficial dog bite to the left upper arm, superficial scratches to the lower legs. Neurological: Negative for headaches, focal weakness or numbness. 10-point ROS otherwise negative.  ____________________________________________   PHYSICAL EXAM:  VITAL SIGNS: ED Triage Vitals  Enc Vitals Group     BP 11/08/17 1811 129/81     Pulse Rate 11/08/17 1811 85     Resp 11/08/17 1811 16     Temp 11/08/17 1811 99 F (37.2 C)     Temp Source 11/08/17 1811 Oral     SpO2 11/08/17 1811 97 %     Weight 11/08/17 1811 275 lb (124.7 kg)     Height 11/08/17 1811 5\' 11"  (1.803 m)     Head Circumference --      Peak Flow --      Pain Score 11/08/17 1817 0     Pain Loc --      Pain Edu? --      Excl. in GC? --      Constitutional: Alert and oriented. Well appearing and in no acute distress. Eyes: Conjunctivae  are normal. PERRL. EOMI. Head: Atraumatic. Neck: No stridor.    Cardiovascular: Normal rate, regular rhythm. Normal S1 and S2.  Good peripheral circulation. Respiratory: Normal respiratory effort without tachypnea or retractions. Lungs CTAB. Good air entry to the bases with no decreased or absent breath sounds. Musculoskeletal: Full range of motion to all extremities. No gross deformities appreciated. Neurologic:  Normal speech and language. No gross focal neurologic deficits are appreciated.  Skin:  Skin is warm, dry and intact. No rash noted.  Patient has minor, superficial abrasions to the left biceps region.  No puncture wounds.  No deep lacerations.  No bleeding.  No foreign body.  Superficial scratches to bilateral lower extremities.  No bleeding.  No foreign  body. Psychiatric: Mood and affect are normal. Speech and behavior are normal. Patient exhibits appropriate insight and judgement.   ____________________________________________   LABS (all labs ordered are listed, but only abnormal results are displayed)  Labs Reviewed - No data to display ____________________________________________  EKG   ____________________________________________  RADIOLOGY   No results found.  ____________________________________________    PROCEDURES  Procedure(s) performed:    Procedures    Medications  Tdap (BOOSTRIX) injection 0.5 mL (has no administration in time range)     ____________________________________________   INITIAL IMPRESSION / ASSESSMENT AND PLAN / ED COURSE  Pertinent labs & imaging results that were available during my care of the patient were reviewed by me and considered in my medical decision making (see chart for details).  Review of the [place] CSRS was performed in accordance of the [facility] prior to dispensing any controlled drugs.     Patient's diagnosis is consistent with dog bite.  Patient sustained a superficial dog bite to the left upper arm.  Patient reports that the animal did have rabies vaccinations according to the previous owner but he has not seen paperwork for same.  Animals been acting normal.  After lengthy discussion about rabies, rabies vaccination, monitoring the animal, the patient opts to have the animal monitored for 1 week.  If the animal should develop any signs of rabies he will return for rabies vaccination.  Patient is advised that he may return at any time regardless of the status of the animal to receive the rabies series should he desire.  He verbalizes understanding of same.  Tetanus shot is updated at this time.  Patient will be placed on antibiotics prophylactically.  Patient will follow primary care as needed..  Patient is given ED precautions to return to the ED for any worsening or new  symptoms.     ____________________________________________  FINAL CLINICAL IMPRESSION(S) / ED DIAGNOSES  Final diagnoses:  Dog bite, initial encounter      NEW MEDICATIONS STARTED DURING THIS VISIT:  ED Discharge Orders        Ordered    doxycycline (VIBRA-TABS) 100 MG tablet  2 times daily     11/08/17 1914          This chart was dictated using voice recognition software/Dragon. Despite best efforts to proofread, errors can occur which can change the meaning. Any change was purely unintentional.    Racheal Patches, PA-C 11/08/17 1914    Dionne Bucy, MD 11/08/17 2224

## 2018-01-22 ENCOUNTER — Emergency Department: Payer: BLUE CROSS/BLUE SHIELD

## 2018-01-22 ENCOUNTER — Emergency Department
Admission: EM | Admit: 2018-01-22 | Discharge: 2018-01-22 | Disposition: A | Discharge status: Home / Self Care | Payer: BLUE CROSS/BLUE SHIELD | Attending: Emergency Medicine | Admitting: Emergency Medicine

## 2018-01-22 DIAGNOSIS — I10 Essential (primary) hypertension: Secondary | ICD-10-CM | POA: Insufficient documentation

## 2018-01-22 DIAGNOSIS — R079 Chest pain, unspecified: Secondary | ICD-10-CM

## 2018-01-22 DIAGNOSIS — Z87891 Personal history of nicotine dependence: Secondary | ICD-10-CM | POA: Diagnosis not present

## 2018-01-22 LAB — BASIC METABOLIC PANEL
Anion gap: 7 (ref 5–15)
BUN: 12 mg/dL (ref 6–20)
CHLORIDE: 109 mmol/L (ref 101–111)
CO2: 24 mmol/L (ref 22–32)
CREATININE: 0.83 mg/dL (ref 0.61–1.24)
Calcium: 8.7 mg/dL — ABNORMAL LOW (ref 8.9–10.3)
GFR calc Af Amer: >60 mL/min (ref >60)
GFR calc non Af Amer: >60 mL/min (ref >60)
Glucose, Bld: 104 mg/dL — ABNORMAL HIGH (ref 65–99)
POTASSIUM: 3.8 mmol/L (ref 3.5–5.1)
Sodium: 140 mmol/L (ref 135–145)

## 2018-01-22 LAB — CBC
HCT: 44 % (ref 40.0–52.0)
Hemoglobin: 15.4 g/dL (ref 13.0–18.0)
MCH: 30.2 pg (ref 26.0–34.0)
MCHC: 35.1 g/dL (ref 32.0–36.0)
MCV: 86.1 fL (ref 80.0–100.0)
PLATELETS: 291 10*3/uL (ref 150–440)
RBC: 5.12 MIL/uL (ref 4.40–5.90)
RDW: 13.3 % (ref 11.5–14.5)
WBC: 9.4 10*3/uL (ref 3.8–10.6)

## 2018-01-22 LAB — TROPONIN I: Troponin I: <0.03 ng/mL (ref <0.03)

## 2018-01-22 MED ORDER — HYDROCHLOROTHIAZIDE 25 MG PO TABS: 25.0000 mg | ORAL_TABLET | Freq: Every day | ORAL | 2 refills | Status: DC

## 2018-01-22 NOTE — ED Provider Notes (Signed)
[facility] Emergency Department Provider Note  Time seen: 6:48 PM  I have reviewed the triage vital signs and the nursing notes.   HISTORY  Chief Complaint Hypertension and Chest Pain    HPI Corey [name] is a 32 y.o. male with a past medical history of bipolar, takes no medications, presents to the emergency department for high blood pressure and chest discomfort.  According to the patient for the past 2 to 3 weeks he has been measuring his blood pressure at home and it has been ranging between 140 180 systolic.  He states today it was 180/120.  States he was feeling some chest tightness he became concerned so he came to the emergency department for evaluation.  States approximately 5 to 6 years ago he was placed on a blood pressure medication but stopped it after approximately 1 year.  Does not have a primary care doctor at this time but just got health insurance and was hoping to get a primary care doctor.  Patient states very mild chest tightness currently, denies any "pain."  Denies any nausea, shortness of breath or diaphoresis.   Past Medical History:  Diagnosis Date  . Bipolar 1 disorder (HCC)     There are no active problems to display for this patient.   Past Surgical History:  Procedure Laterality Date  . KNEE ARTHROSCOPY      Prior to Admission medications   Medication Sig Start Date End Date Taking? Authorizing Provider  doxycycline (VIBRA-TABS) 100 MG tablet Take 1 tablet (100 mg total) by mouth 2 (two) times daily. 11/08/17   Cuthriell, Delorise Royals, PA-C  ibuprofen (ADVIL,MOTRIN) 800 MG tablet Take 1 tablet (800 mg total) by mouth every 8 (eight) hours as needed for moderate pain. 12/30/14   Irean Hong, MD  oxyCODONE-acetaminophen (ROXICET) 5-325 MG per tablet Take 1 tablet by mouth every 4 (four) hours as needed for severe pain. 12/30/14   Irean Hong, MD    Allergies  Allergen Reactions  . Penicillins Hives  . Sulfa Antibiotics Hives     No family history on file.  Social History Social History   Tobacco Use  . Smoking status: Former Games developer  . Smokeless tobacco: Current User  Substance Use Topics  . Alcohol use: No  . Drug use: Never    Review of Systems Constitutional: Negative for fever. Cardiovascular: Mild chest tightness Respiratory: Negative for shortness of breath. Gastrointestinal: Negative for abdominal pain, vomiting and diarrhea. Musculoskeletal: States mild intermittent swelling in his feet Skin: Negative for skin complaints  Neurological: Negative for headache All other ROS negative  ____________________________________________   PHYSICAL EXAM:  VITAL SIGNS: ED Triage Vitals  Enc Vitals Group     BP 01/22/18 1748 (!) 147/86     Pulse Rate 01/22/18 1746 85     Resp 01/22/18 1746 18     Temp 01/22/18 1746 99.1 F (37.3 C)     Temp Source 01/22/18 1746 Oral     SpO2 01/22/18 1746 98 %     Weight 01/22/18 1749 275 lb (124.7 kg)     Height --      Head Circumference --      Peak Flow --      Pain Score 01/22/18 1749 6     Pain Loc --      Pain Edu? --      Excl. in GC? --     Constitutional: Alert and oriented. Well appearing and in no distress.  Eyes: Normal exam ENT   Head: Normocephalic and atraumatic.   Mouth/Throat: Mucous membranes are moist. Cardiovascular: Normal rate, regular rhythm. No murmur Respiratory: Normal respiratory effort without tachypnea nor retractions. Breath sounds are clear  Gastrointestinal: Soft and nontender. No distention.   Musculoskeletal: Nontender with normal range of motion in all extremities. No lower extremity tenderness or edema. Neurologic:  Normal speech and language. No gross focal neurologic deficits Skin:  Skin is warm, dry and intact.  Psychiatric: Mood and affect are normal.   ____________________________________________    EKG  EKG reviewed and interpreted by myself shows normal sinus rhythm 84 bpm with a narrow QRS,  normal axis, normal intervals, no concerning ST changes.  ____________________________________________    RADIOLOGY  Chest x-ray negative  ____________________________________________   INITIAL IMPRESSION / ASSESSMENT AND PLAN / ED COURSE  Pertinent labs & imaging results that were available during my care of the patient were reviewed by me and considered in my medical decision making (see chart for details).  Patient presents to the emergency department for intermittent chest tightness and blood pressure 180/120.  Patient has been keeping a log of his blood pressures for the past 3 weeks they have remained elevated over 140.  Reassuringly patient's work-up including labs are normal, troponin negative, chest x-ray and EKG are reassuring.  Given the patient's hypertension over time we will prescribe hydrochlorothiazide have the patient follow-up with primary care doctor for further evaluation.  Patient agreeable to this plan of care.  Provided my normal chest pain return precautions.  ____________________________________________   FINAL CLINICAL IMPRESSION(S) / ED DIAGNOSES  Hypertension Chest pain    Minna AntisPaduchowski, Sherill Mangen, MD 01/22/18 1851

## 2018-01-22 NOTE — ED Notes (Signed)
AAOx3.  Skin warm and dry.  NAD 

## 2018-01-22 NOTE — ED Triage Notes (Signed)
Pt reports having increasingly high blood pressure over past week.  Pt denies taking any medication for this.  Pt states he has been keeping track of BP at home.  Pt is A&Ox4, in NAD.

## 2018-03-20 ENCOUNTER — Emergency Department
Admission: EM | Admit: 2018-03-20 | Discharge: 2018-03-20 | Disposition: A | Discharge status: Home / Self Care | Payer: BLUE CROSS/BLUE SHIELD | Attending: Emergency Medicine | Admitting: Emergency Medicine

## 2018-03-20 ENCOUNTER — Encounter: Payer: Self-pay | Admitting: Emergency Medicine

## 2018-03-20 DIAGNOSIS — I10 Essential (primary) hypertension: Secondary | ICD-10-CM | POA: Insufficient documentation

## 2018-03-20 DIAGNOSIS — K921 Melena: Secondary | ICD-10-CM | POA: Diagnosis present

## 2018-03-20 DIAGNOSIS — Z87891 Personal history of nicotine dependence: Secondary | ICD-10-CM | POA: Insufficient documentation

## 2018-03-20 DIAGNOSIS — Z79899 Other long term (current) drug therapy: Secondary | ICD-10-CM | POA: Insufficient documentation

## 2018-03-20 DIAGNOSIS — F319 Bipolar disorder, unspecified: Secondary | ICD-10-CM | POA: Insufficient documentation

## 2018-03-20 HISTORY — DX: Essential (primary) hypertension: I10

## 2018-03-20 LAB — CBC
HCT: 45.5 % (ref 40.0–52.0)
Hemoglobin: 15.8 g/dL (ref 13.0–18.0)
MCH: 30.1 pg (ref 26.0–34.0)
MCHC: 34.6 g/dL (ref 32.0–36.0)
MCV: 86.9 fL (ref 80.0–100.0)
Platelets: 258 10*3/uL (ref 150–440)
RBC: 5.24 MIL/uL (ref 4.40–5.90)
RDW: 13.5 % (ref 11.5–14.5)
WBC: 10.2 10*3/uL (ref 3.8–10.6)

## 2018-03-20 LAB — COMPREHENSIVE METABOLIC PANEL WITH GFR
ALT: 20 U/L (ref 0–44)
AST: 25 U/L (ref 15–41)
Albumin: 4.1 g/dL (ref 3.5–5.0)
Alkaline Phosphatase: 73 U/L (ref 38–126)
Anion gap: 8 (ref 5–15)
BUN: 10 mg/dL (ref 6–20)
CO2: 24 mmol/L (ref 22–32)
Calcium: 9 mg/dL (ref 8.9–10.3)
Chloride: 106 mmol/L (ref 98–111)
Creatinine, Ser: 1.1 mg/dL (ref 0.61–1.24)
GFR calc Af Amer: >60 mL/min
GFR calc non Af Amer: >60 mL/min
Glucose, Bld: 115 mg/dL — ABNORMAL HIGH (ref 70–99)
Potassium: 3 mmol/L — ABNORMAL LOW (ref 3.5–5.1)
Sodium: 138 mmol/L (ref 135–145)
Total Bilirubin: 0.6 mg/dL (ref 0.3–1.2)
Total Protein: 7.4 g/dL (ref 6.5–8.1)

## 2018-03-20 LAB — TYPE AND SCREEN
ABO/RH(D): B POS
Antibody Screen: NEGATIVE

## 2018-03-20 MED ORDER — SUCRALFATE 1 G PO TABS: 1.0000 g | ORAL_TABLET | Freq: Four times a day (QID) | ORAL | 0 refills | Status: DC

## 2018-03-20 MED ORDER — FAMOTIDINE 40 MG PO TABS: 40.0000 mg | ORAL_TABLET | Freq: Every evening | ORAL | 1 refills | Status: DC

## 2018-03-20 NOTE — ED Triage Notes (Signed)
Patient presents to the ED with black tarry stools x 3 days.  Patient started on several new medications approx. 3 weeks ago including fluoxetine which he read has risk of GI bleeding.  Patient also states, "I take BCs like they are candy."  Patient is in no obvious distress at this time.

## 2018-03-20 NOTE — ED Provider Notes (Signed)
[facility] Emergency Department Provider Note   ____________________________________________   I have reviewed the triage vital signs and the nursing notes.   HISTORY  Chief Complaint Melena and Fatigue   History limited by: Not Limited   HPI Corey [name] is a 32 y.o. male who presents to the emergency department today with concerns for dark stool.  The patient states that the symptoms have been present for 3 days.  He has had some abdominal discomfort with this.  States he is concerned for GI bleed.  He states he takes a lot of aspirin as well as other anti-inflammatory over-the-counter medications.  Furthermore he states that 1 of his new medications fluoxetine it can have a side effect of GI bleed.  Patient denies any significant shortness of breath although states he has had some fatigue which again he thinks could be related to the medication.  Patient denies similar symptoms in the past.   Per medical record review patient has a history of htn, bipolar.  Past Medical History:  Diagnosis Date  . Bipolar 1 disorder (HCC)   . HTN (hypertension)     There are no active problems to display for this patient.   Past Surgical History:  Procedure Laterality Date  . KNEE ARTHROSCOPY      Prior to Admission medications   Medication Sig Start Date End Date Taking? Authorizing Provider  doxycycline (VIBRA-TABS) 100 MG tablet Take 1 tablet (100 mg total) by mouth 2 (two) times daily. 11/08/17   Cuthriell, Delorise RoyalsJonathan D, PA-C  hydrochlorothiazide (HYDRODIURIL) 25 MG tablet Take 1 tablet (25 mg total) by mouth daily. 01/22/18   Minna AntisPaduchowski, Kevin, MD  ibuprofen (ADVIL,MOTRIN) 800 MG tablet Take 1 tablet (800 mg total) by mouth every 8 (eight) hours as needed for moderate pain. 12/30/14   Irean HongSung, Jade J, MD  oxyCODONE-acetaminophen (ROXICET) 5-325 MG per tablet Take 1 tablet by mouth every 4 (four) hours as needed for severe pain. 12/30/14   Irean HongSung, Jade J, MD     Allergies Penicillins and Sulfa antibiotics  No family history on file.  Social History Social History   Tobacco Use  . Smoking status: Former Games developermoker  . Smokeless tobacco: Current User  Substance Use Topics  . Alcohol use: No  . Drug use: Never    Review of Systems Constitutional: No fever/chills Eyes: No visual changes. ENT: No sore throat. Cardiovascular: Denies chest pain. Respiratory: Denies shortness of breath. Gastrointestinal: Positive for abdominal pain, dar stools. Genitourinary: Negative for dysuria. Musculoskeletal: Negative for back pain. Skin: Negative for rash. Neurological: Negative for headaches, focal weakness or numbness.  ____________________________________________   PHYSICAL EXAM:  VITAL SIGNS: ED Triage Vitals [03/20/18 1851]  Enc Vitals Group     BP 136/86     Pulse Rate 66     Resp 18     Temp 99.6 F (37.6 C)     Temp Source Oral     SpO2 98 %     Weight 285 lb (129.3 kg)     Height 5\' 11"  (1.803 m)     Head Circumference      Peak Flow      Pain Score 1   Constitutional: Alert and oriented.  Eyes: Conjunctivae are normal.  ENT      Head: Normocephalic and atraumatic.      Nose: No congestion/rhinnorhea.      Mouth/Throat: Mucous membranes are moist.      Neck: No stridor. Hematological/Lymphatic/Immunilogical: No cervical lymphadenopathy. Cardiovascular:  Normal rate, regular rhythm.  No murmurs, rubs, or gallops.  Respiratory: Normal respiratory effort without tachypnea nor retractions. Breath sounds are clear and equal bilaterally. No wheezes/rales/rhonchi. Gastrointestinal: Soft and non tender. No rebound. No guarding.  Genitourinary: Deferred Musculoskeletal: Normal range of motion in all extremities. No lower extremity edema. Neurologic:  Normal speech and language. No gross focal neurologic deficits are appreciated.  Skin:  Skin is warm, dry and intact. No rash noted. Psychiatric: Mood and affect are normal. Speech  and behavior are normal. Patient exhibits appropriate insight and judgment.  ____________________________________________    LABS (pertinent positives/negatives)  CBC wbc 10.2, hgb 15.8, plt 258 CMP wnl except k 3.0, glu 115  ____________________________________________   EKG  None  ____________________________________________    RADIOLOGY  None  ____________________________________________   PROCEDURES  Procedures  ____________________________________________   INITIAL IMPRESSION / ASSESSMENT AND PLAN / ED COURSE  Pertinent labs & imaging results that were available during my care of the patient were reviewed by me and considered in my medical decision making (see chart for details).   Patient presented to the emergency department today because of dark stools.  This point I think likely upper GI bleed secondary to medication.  Patient is not anemic on blood work.  At this point do feel reasonable to be treated as an outpatient.  Will start on sucralfate and antiacid.  Will give patient dietary information.   ____________________________________________   FINAL CLINICAL IMPRESSION(S) / ED DIAGNOSES  Final diagnoses:  Melena     Note: This dictation was prepared with Dragon dictation. Any transcriptional errors that result from this process are unintentional     Phineas SemenGoodman, [place], MD 03/20/18 2348

## 2018-03-20 NOTE — ED Notes (Signed)
Pt states that he started some new meds about 3 weeks ago. He has noticed black, tarry stools, bloating, and heart burn. Pt noticed that bottle read that stomach bleeding could occur if he takes the meds with tylenol and [facility]. Pt suspects stomach bleeding. Pt also takes an antinflammatory.

## 2018-03-20 NOTE — Discharge Instructions (Addendum)
Please seek medical attention for any high fevers, chest pain, shortness of breath, change in behavior, persistent vomiting, bloody stool or any other new or concerning symptoms.  

## 2018-03-26 ENCOUNTER — Observation Stay
Admission: EM | Admit: 2018-03-26 | Discharge: 2018-03-27 | Disposition: A | Discharge status: Home / Self Care | Payer: BLUE CROSS/BLUE SHIELD | Attending: Specialist | Admitting: Specialist

## 2018-03-26 ENCOUNTER — Emergency Department: Payer: BLUE CROSS/BLUE SHIELD

## 2018-03-26 ENCOUNTER — Other Ambulatory Visit: Payer: Self-pay

## 2018-03-26 DIAGNOSIS — Z79899 Other long term (current) drug therapy: Secondary | ICD-10-CM | POA: Diagnosis not present

## 2018-03-26 DIAGNOSIS — Z882 Allergy status to sulfonamides status: Secondary | ICD-10-CM | POA: Insufficient documentation

## 2018-03-26 DIAGNOSIS — K920 Hematemesis: Secondary | ICD-10-CM | POA: Diagnosis not present

## 2018-03-26 DIAGNOSIS — N200 Calculus of kidney: Secondary | ICD-10-CM

## 2018-03-26 DIAGNOSIS — I10 Essential (primary) hypertension: Secondary | ICD-10-CM | POA: Insufficient documentation

## 2018-03-26 DIAGNOSIS — Z87891 Personal history of nicotine dependence: Secondary | ICD-10-CM | POA: Insufficient documentation

## 2018-03-26 DIAGNOSIS — F319 Bipolar disorder, unspecified: Secondary | ICD-10-CM | POA: Insufficient documentation

## 2018-03-26 DIAGNOSIS — N132 Hydronephrosis with renal and ureteral calculous obstruction: Principal | ICD-10-CM | POA: Insufficient documentation

## 2018-03-26 DIAGNOSIS — Z880 Allergy status to penicillin: Secondary | ICD-10-CM | POA: Diagnosis not present

## 2018-03-26 LAB — CBC WITH DIFFERENTIAL/PLATELET
BAND NEUTROPHILS: 0 %
BASOS PCT: 0 %
Basophils Absolute: 0 10*3/uL (ref 0–0.1)
Blasts: 0 %
EOS PCT: 3 %
Eosinophils Absolute: 0.3 10*3/uL (ref 0–0.7)
HEMATOCRIT: 43.9 % (ref 40.0–52.0)
Hemoglobin: 15.7 g/dL (ref 13.0–18.0)
LYMPHS ABS: 1.6 10*3/uL (ref 1.0–3.6)
LYMPHS PCT: 18 %
MCH: 30.4 pg (ref 26.0–34.0)
MCHC: 35.7 g/dL (ref 32.0–36.0)
MCV: 85.2 fL (ref 80.0–100.0)
MONO ABS: 0.6 10*3/uL (ref 0.2–1.0)
Metamyelocytes Relative: 0 %
Monocytes Relative: 7 %
Myelocytes: 0 %
NEUTROS ABS: 6.4 10*3/uL (ref 1.4–6.5)
NRBC: 0 /100{WBCs}
Neutrophils Relative %: 72 %
OTHER: 0 %
PLATELETS: 274 10*3/uL (ref 150–440)
PROMYELOCYTES RELATIVE: 0 %
RBC: 5.15 MIL/uL (ref 4.40–5.90)
RDW: 13.1 % (ref 11.5–14.5)
WBC: 8.9 10*3/uL (ref 3.8–10.6)

## 2018-03-26 LAB — COMPREHENSIVE METABOLIC PANEL
ALBUMIN: 4.3 g/dL (ref 3.5–5.0)
ALT: 32 U/L (ref 0–44)
ANION GAP: 11 (ref 5–15)
AST: 26 U/L (ref 15–41)
Alkaline Phosphatase: 77 U/L (ref 38–126)
BUN: 13 mg/dL (ref 6–20)
CHLORIDE: 106 mmol/L (ref 98–111)
CO2: 24 mmol/L (ref 22–32)
Calcium: 9.3 mg/dL (ref 8.9–10.3)
Creatinine, Ser: 1.26 mg/dL — ABNORMAL HIGH (ref 0.61–1.24)
GFR calc Af Amer: >60 mL/min (ref >60)
GFR calc non Af Amer: >60 mL/min (ref >60)
GLUCOSE: 118 mg/dL — AB (ref 70–99)
Potassium: 3.5 mmol/L (ref 3.5–5.1)
SODIUM: 141 mmol/L (ref 135–145)
Total Bilirubin: 1.1 mg/dL (ref 0.3–1.2)
Total Protein: 7.6 g/dL (ref 6.5–8.1)

## 2018-03-26 LAB — URINALYSIS, COMPLETE (UACMP) WITH MICROSCOPIC
BACTERIA UA: NONE SEEN
Glucose, UA: NEGATIVE mg/dL
KETONES UR: 5 mg/dL — AB
LEUKOCYTES UA: NEGATIVE
NITRITE: NEGATIVE
PH: 5 (ref 5.0–8.0)
Protein, ur: 100 mg/dL — AB
SQUAMOUS EPITHELIAL / LPF: NONE SEEN (ref 0–5)
Specific Gravity, Urine: 1.033 — ABNORMAL HIGH (ref 1.005–1.030)

## 2018-03-26 LAB — LIPASE, BLOOD: Lipase: 22 U/L (ref 11–51)

## 2018-03-26 LAB — HEMOGLOBIN
HEMOGLOBIN: 13.6 g/dL (ref 13.0–18.0)
Hemoglobin: 15.1 g/dL (ref 13.0–18.0)

## 2018-03-26 MED ORDER — FLUOXETINE HCL 20 MG PO CAPS
40.0000 mg | ORAL_CAPSULE | Freq: Every day | ORAL | Status: DC
  Administered 2018-03-26 – 2018-03-27 (×2): 40 mg via ORAL
  Filled 2018-03-26 (×2): qty 2

## 2018-03-26 MED ORDER — CLONIDINE HCL 0.1 MG PO TABS
0.1000 mg | ORAL_TABLET | Freq: Three times a day (TID) | ORAL | Status: DC
  Administered 2018-03-26: 0.1 mg via ORAL
  Filled 2018-03-26 (×2): qty 1

## 2018-03-26 MED ORDER — ACETAMINOPHEN 325 MG PO TABS: 650.0000 mg | ORAL_TABLET | Freq: Four times a day (QID) | ORAL | Status: DC | PRN

## 2018-03-26 MED ORDER — SODIUM CHLORIDE 0.9 % IV SOLN
8.0000 mg/h | INTRAVENOUS | Status: DC
  Administered 2018-03-26 – 2018-03-27 (×2): 8 mg/h via INTRAVENOUS
  Filled 2018-03-26 (×2): qty 80

## 2018-03-26 MED ORDER — SODIUM CHLORIDE 0.9 % IV SOLN: INTRAVENOUS | Status: DC

## 2018-03-26 MED ORDER — ONDANSETRON HCL 4 MG/2ML IJ SOLN
4.0000 mg | Freq: Once | INTRAMUSCULAR | Status: AC
Start: 2018-03-26 — End: 2018-03-26
  Administered 2018-03-26: 4 mg via INTRAVENOUS
  Filled 2018-03-26: qty 2

## 2018-03-26 MED ORDER — SODIUM CHLORIDE 0.9 % IV BOLUS
1000.0000 mL | Freq: Once | INTRAVENOUS | Status: AC
  Administered 2018-03-26: 1000 mL via INTRAVENOUS

## 2018-03-26 MED ORDER — SODIUM CHLORIDE 0.9 % IV SOLN
INTRAVENOUS | Status: DC
  Administered 2018-03-26 – 2018-03-27 (×2): via INTRAVENOUS

## 2018-03-26 MED ORDER — SUCRALFATE 1 G PO TABS
1.0000 g | ORAL_TABLET | Freq: Four times a day (QID) | ORAL | Status: DC
  Administered 2018-03-26 – 2018-03-27 (×4): 1 g via ORAL
  Filled 2018-03-26 (×4): qty 1

## 2018-03-26 MED ORDER — PANTOPRAZOLE SODIUM 40 MG IV SOLR: 40.0000 mg | Freq: Two times a day (BID) | INTRAVENOUS | Status: DC

## 2018-03-26 MED ORDER — ONDANSETRON HCL 4 MG/2ML IJ SOLN
4.0000 mg | Freq: Four times a day (QID) | INTRAMUSCULAR | Status: DC | PRN
  Administered 2018-03-26: 4 mg via INTRAVENOUS
  Filled 2018-03-26: qty 2

## 2018-03-26 MED ORDER — HYDROCHLOROTHIAZIDE 25 MG PO TABS
25.0000 mg | ORAL_TABLET | Freq: Every day | ORAL | Status: DC
  Administered 2018-03-26: 25 mg via ORAL
  Filled 2018-03-26: qty 1

## 2018-03-26 MED ORDER — CLONAZEPAM 1 MG PO TABS: 1.0000 mg | ORAL_TABLET | Freq: Two times a day (BID) | ORAL | Status: DC | PRN

## 2018-03-26 MED ORDER — KETOROLAC TROMETHAMINE 30 MG/ML IJ SOLN
30.0000 mg | Freq: Once | INTRAMUSCULAR | Status: DC
  Filled 2018-03-26: qty 1

## 2018-03-26 MED ORDER — MORPHINE SULFATE (PF) 4 MG/ML IV SOLN
6.0000 mg | Freq: Once | INTRAVENOUS | Status: AC
  Administered 2018-03-26: 6 mg via INTRAVENOUS
  Filled 2018-03-26: qty 2

## 2018-03-26 MED ORDER — MORPHINE SULFATE (PF) 2 MG/ML IV SOLN
2.0000 mg | INTRAVENOUS | Status: DC | PRN
  Administered 2018-03-26: 2 mg via INTRAVENOUS
  Filled 2018-03-26: qty 1

## 2018-03-26 MED ORDER — OXYCODONE HCL 5 MG PO TABS: 5.0000 mg | ORAL_TABLET | ORAL | Status: DC | PRN

## 2018-03-26 MED ORDER — SODIUM CHLORIDE 0.9 % IV SOLN
80.0000 mg | Freq: Once | INTRAVENOUS | Status: AC
  Administered 2018-03-26: 80 mg via INTRAVENOUS
  Filled 2018-03-26: qty 40

## 2018-03-26 MED ORDER — ONDANSETRON HCL 4 MG PO TABS: 4.0000 mg | ORAL_TABLET | Freq: Four times a day (QID) | ORAL | Status: DC | PRN

## 2018-03-26 MED ORDER — ACETAMINOPHEN 650 MG RE SUPP: 650.0000 mg | Freq: Four times a day (QID) | RECTAL | Status: DC | PRN

## 2018-03-26 NOTE — ED Provider Notes (Signed)
[facility] Emergency Department Provider Note ____________________________________________   First MD Initiated Contact with Patient 03/26/18 1109     (approximate)  I have reviewed the triage vital signs and the nursing notes.   HISTORY  Chief Complaint Flank Pain   HPI Corey [name] is a 32 y.o. male Strid bipolar disorder as well as hypertension and family history of kidney stones was presented to the emergency department the right lower flank pain radiating into his right lower quadrant and testicles.  The patient says the pain is a 9 out of 10 and sharp at this time.  Started at 5 AM and has progressed.  Given 50 mcg of fentanyl in route with EMS as well as IM Zofran without relief.  Patient says that he has vomited and continues to be nauseous.  Also difficulty urinating this morning.   Past Medical History:  Diagnosis Date  . Bipolar 1 disorder (HCC)   . HTN (hypertension)     There are no active problems to display for this patient.   Past Surgical History:  Procedure Laterality Date  . KNEE ARTHROSCOPY      Prior to Admission medications   Medication Sig Start Date End Date Taking? Authorizing Provider  clonazePAM (KLONOPIN) 1 MG tablet Take 1 mg by mouth 2 (two) times daily as needed for anxiety. 02/28/18  Yes [provider]  cloNIDine (CATAPRES) 0.1 MG tablet Take 0.1 mg by mouth 3 (three) times daily. 02/28/18  Yes [provider]  FLUoxetine (PROZAC) 40 MG capsule Take 40 mg by mouth daily. 02/28/18  Yes [provider]  hydrochlorothiazide (HYDRODIURIL) 25 MG tablet Take 1 tablet (25 mg total) by mouth daily. 01/22/18  Yes Minna Antis, MD  oxyCODONE-acetaminophen (ROXICET) 5-325 MG per tablet Take 1 tablet by mouth every 4 (four) hours as needed for severe pain. 12/30/14  Yes Irean Hong, MD  sucralfate (CARAFATE) 1 g tablet Take 1 tablet (1 g total) by mouth 4 (four) times daily. 03/20/18  Yes Phineas Semen, MD  famotidine (PEPCID) 40 MG tablet Take 1 tablet (40 mg total) by mouth every evening. Patient not taking: Reported on 03/26/2018 03/20/18 03/20/19  Phineas Semen, MD  ibuprofen (ADVIL,MOTRIN) 800 MG tablet Take 1 tablet (800 mg total) by mouth every 8 (eight) hours as needed for moderate pain. Patient not taking: Reported on 03/26/2018 12/30/14   Irean Hong, MD    Allergies Penicillins and Sulfa antibiotics  History reviewed. No pertinent family history.  Social History Social History   Tobacco Use  . Smoking status: Former Games developer  . Smokeless tobacco: Current User  Substance Use Topics  . Alcohol use: No  . Drug use: Never    Review of Systems  Constitutional: No fever/chills Eyes: No visual changes. ENT: No sore throat. Cardiovascular: Denies chest pain. Respiratory: Denies shortness of breath. Gastrointestinal:  No diarrhea.  No constipation. Genitourinary: Negative for dysuria. Musculoskeletal: As above Skin: Negative for rash. Neurological: Negative for headaches, focal weakness or numbness.   ____________________________________________   PHYSICAL EXAM:  VITAL SIGNS: ED Triage Vitals [03/26/18 1106]  Enc Vitals Group     BP 129/74     Pulse      Resp      Temp      Temp src      SpO2      Weight      Height      Head Circumference      Peak Flow  Pain Score      Pain Loc      Pain Edu?      Excl. in GC?     Constitutional: Alert and oriented. Well appearing and in no acute distress. Eyes: Conjunctivae are normal.  Head: Atraumatic. Nose: No congestion/rhinnorhea. Mouth/Throat: Mucous membranes are moist.  Neck: No stridor.   Cardiovascular: Normal rate, regular rhythm. Grossly normal heart sounds.   Respiratory: Normal respiratory effort.  No retractions. Lungs CTAB. Gastrointestinal: Soft with moderate right lower quadrant abdominal tenderness to palpation without rebound or guarding. No distention.  Right CVA tenderness to  palpation. Musculoskeletal: No lower extremity tenderness nor edema.  No joint effusions. Neurologic:  Normal speech and language. No gross focal neurologic deficits are appreciated. Skin:  Skin is warm, dry and intact. No rash noted. Psychiatric: Mood and affect are normal. Speech and behavior are normal.  ____________________________________________   LABS (all labs ordered are listed, but only abnormal results are displayed)  Labs Reviewed  COMPREHENSIVE METABOLIC PANEL - Abnormal; Notable for the following components:      Result Value   Glucose, Bld 118 (*)    Creatinine, Ser 1.26 (*)    All other components within normal limits  URINALYSIS, COMPLETE (UACMP) WITH MICROSCOPIC - Abnormal; Notable for the following components:   Color, Urine AMBER (*)    APPearance TURBID (*)    Specific Gravity, Urine 1.033 (*)    Hgb urine dipstick LARGE (*)    Bilirubin Urine SMALL (*)    Ketones, ur 5 (*)    Protein, ur 100 (*)    All other components within normal limits  CBC WITH DIFFERENTIAL/PLATELET  LIPASE, BLOOD   ____________________________________________  EKG   ____________________________________________  RADIOLOGY  Punctate right UVJ stone. ____________________________________________   PROCEDURES  Procedure(s) performed:   Procedures  Critical Care performed:   ____________________________________________   INITIAL IMPRESSION / ASSESSMENT AND PLAN / ED COURSE  Pertinent labs & imaging results that were available during my care of the patient were reviewed by me and considered in my medical decision making (see chart for details).  Differential diagnosis includes, but is not limited to, acute appendicitis, renal colic, testicular torsion, urinary tract infection/pyelonephritis, prostatitis,  epididymitis, diverticulitis, small bowel obstruction or ileus, colitis, abdominal aortic aneurysm, gastroenteritis, hernia, etc. As part of my medical decision making,  I reviewed the following data within the electronic MEDICAL RECORD NUMBER Notes from prior ED visits  ----------------------------------------- 1:43 PM on 03/26/2018 -----------------------------------------  Patient with persistent pain despite fentanyl and morphine.  Now with vomiting coffee grounds.  Toradol withheld.  Patient will be given a Protonix drip and will be admitted to the hospital.  Signed out to Dr. Allena KatzPatel.  Patient understand the diagnosis as well as treatment plan willing to comply. ____________________________________________   FINAL CLINICAL IMPRESSION(S) / ED DIAGNOSES  Kidney stone.  Coffee-ground emesis.  NEW MEDICATIONS STARTED DURING THIS VISIT:  New Prescriptions   No medications on file     Note:  This document was prepared using Dragon voice recognition software and may include unintentional dictation errors.     Myrna BlazerSchaevitz, Kaylaann Mountz Matthew, MD 03/26/18 1344

## 2018-03-26 NOTE — Consult Note (Signed)
Melodie Bouillon, MD [address], [address], [place], [place], [address] [address], [address], [place], [place], [address] Phone: [phone]  Fax: [phone]  Consultation  Referring Provider:     Dr. Allena Katz Primary Care Physician:  Patient, No Pcp Per Reason for Consultation:     Hematemesis  Date of Admission:  03/26/2018 Date of Consultation:  03/26/2018         HPI:   Corey [name] is a 32 y.o. male with history of NSAID use at home, presented with right-sided flank pain started 1 day ago.  GI is being consulted due to coffee-ground emesis in the ER x1.  No hemodynamic instability.  No bright red blood in emesis.  Right-sided flank pain is dull, 8/10, nonradiating.  Improved with pain medications.  Patient also presented to the ER last week due to dark stool and abdominal discomfort, but hemoglobin was stable, and patient was discharged with sucralfate and antacid.  CT on admission shows punctate calculus at the right UVJ, and associated mild right hydroureteronephrosis.  No dysphagia.  No prior EGD or colonoscopy.  Past Medical History:  Diagnosis Date  . Bipolar 1 disorder (HCC)   . HTN (hypertension)     Past Surgical History:  Procedure Laterality Date  . KNEE ARTHROSCOPY      Prior to Admission medications   Medication Sig Start Date End Date Taking? Authorizing Provider  clonazePAM (KLONOPIN) 1 MG tablet Take 1 mg by mouth 2 (two) times daily as needed for anxiety. 02/28/18  Yes [provider]  cloNIDine (CATAPRES) 0.1 MG tablet Take 0.1 mg by mouth 3 (three) times daily. 02/28/18  Yes [provider]  FLUoxetine (PROZAC) 40 MG capsule Take 40 mg by mouth daily. 02/28/18  Yes [provider]  hydrochlorothiazide (HYDRODIURIL) 25 MG tablet Take 1 tablet (25 mg total) by mouth daily. 01/22/18  Yes Minna Antis, MD  oxyCODONE-acetaminophen (ROXICET) 5-325 MG per tablet Take 1 tablet by mouth every 4 (four) hours as needed for  severe pain. 12/30/14  Yes Irean Hong, MD  sucralfate (CARAFATE) 1 g tablet Take 1 tablet (1 g total) by mouth 4 (four) times daily. 03/20/18  Yes Phineas Semen, MD  famotidine (PEPCID) 40 MG tablet Take 1 tablet (40 mg total) by mouth every evening. Patient not taking: Reported on 03/26/2018 03/20/18 03/20/19  Phineas Semen, MD  ibuprofen (ADVIL,MOTRIN) 800 MG tablet Take 1 tablet (800 mg total) by mouth every 8 (eight) hours as needed for moderate pain. Patient not taking: Reported on 03/26/2018 12/30/14   Irean Hong, MD    History reviewed. No pertinent family history.   Social History   Tobacco Use  . Smoking status: Former Games developer  . Smokeless tobacco: Current User  Substance Use Topics  . Alcohol use: No  . Drug use: Never    Allergies as of 03/26/2018 - Review Complete 03/26/2018  Allergen Reaction Noted  . Penicillins Hives 12/29/2014  . Sulfa antibiotics Hives 12/29/2014    Review of Systems:    All systems reviewed and negative except where noted in HPI.   Physical Exam:  Vital signs in last 24 hours: Vitals:   03/26/18 1106 03/26/18 1142 03/26/18 1143  BP: 129/74    Temp:  97.8 F (36.6 C)   TempSrc:  Oral   Weight:   129 kg  Height:   5\' 11"  (1.803 m)     General:   Pleasant, cooperative in NAD Head:  Normocephalic and  atraumatic. Eyes:   No icterus.   Conjunctiva pink. PERRLA. Ears:  Normal auditory acuity. Neck:  Supple; no masses or thyroidomegaly Lungs: Respirations even and unlabored. Lungs clear to auscultation bilaterally.   No wheezes, crackles, or rhonchi.  Abdomen:  Soft, nondistended, nontender. Normal bowel sounds. No appreciable masses or hepatomegaly.  No rebound or guarding.  Neurologic:  Alert and oriented x3;  grossly normal neurologically. Skin:  Intact without significant lesions or rashes. Cervical Nodes:  No significant cervical adenopathy. Psych:  Alert and cooperative. Normal affect.  LAB RESULTS: Recent Labs     03/26/18 1112  WBC 8.9  HGB 15.7  HCT 43.9  PLT 274   BMET Recent Labs    03/26/18 1112  NA 141  K 3.5  CL 106  CO2 24  GLUCOSE 118*  BUN 13  CREATININE 1.26*  CALCIUM 9.3   LFT Recent Labs    03/26/18 1112  PROT 7.6  ALBUMIN 4.3  AST 26  ALT 32  ALKPHOS 77  BILITOT 1.1   PT/INR No results for input(s): LABPROT, INR in the last 72 hours.  STUDIES: Ct Renal Stone Study  Result Date: 03/26/2018 CLINICAL DATA:  Right flank/groin pain, difficulty urinating EXAM: CT ABDOMEN AND PELVIS WITHOUT CONTRAST TECHNIQUE: Multidetector CT imaging of the abdomen and pelvis was performed following the standard protocol without IV contrast. COMPARISON:  None. FINDINGS: Lower chest: Lung bases are clear. Hepatobiliary: Unenhanced liver is unremarkable. Gallbladder is unremarkable. No intrahepatic or extrahepatic ductal dilatation. Pancreas: Within normal limits. Spleen: Within normal limits. Adrenals/Urinary Tract: Adrenal glands are within normal limits. Left kidney is within normal limits. Punctate calculus at the right UVJ (coronal image 88). Associated mild right hydroureteronephrosis. Bladder is underdistended. Stomach/Bowel: Stomach is within normal limits. No evidence of bowel obstruction. Normal appendix (series 2/image 54). Colon is decompressed. Vascular/Lymphatic: No evidence of abdominal aortic aneurysm. No suspicious abdominopelvic lymphadenopathy. Reproductive: Prostate is unremarkable. Other: No abdominopelvic ascites. Musculoskeletal: Visualized osseous structures are within normal limits. IMPRESSION: Punctate calculus at the right UVJ. Associated mild right hydroureteronephrosis. Electronically Signed   By: Charline Bills M.D.   On: 03/26/2018 11:41      Impression / Plan:   Corey [name] is a 32 y.o. y/o male with history of NSAID use at home, presents with right flank pain and diagnosed with right-sided kidney stones with associated hydroureteronephrosis, and GI  consulted for coffee-ground emesis in the ER  Hemoglobin normal at 15 Patient is hemodynamically stable Coffee-ground emesis is not specific for GI bleed Coffee-ground emesis could represent food in the stomach  He is currently hemodynamically stable and there is no indication for emergent EGD at this time Would recommend further urology work-up at this time  Can consider EGD depending on improvement of clinical symptoms  If no signs of active GI bleeding, and hemoglobin stays normal, would recommend conservative management with PPI twice daily  If hemoglobin drops, or patient has signs of active GI bleeding, can consider EGD at that time  PPI IV twice daily today and change to p.o. once daily dosing after today if no signs of active GI bleeding and hemoglobin normal.  Continue serial CBCs and transfuse PRN Avoid NSAIDs Maintain 2 large-bore IV lines Please page GI with any acute hemodynamic changes, or signs of active GI bleeding  Clear liquid diet okay   Thank you for involving me in the care of this patient.      LOS: 0 days   Pasty Spillers, MD  03/26/2018, 2:29 PM

## 2018-03-26 NOTE — H&P (Signed)
[facility] - [place] at [facility]   PATIENT NAME: Corey ClinesDevin [name]    MR#:  [id number]  DATE OF BIRTH:  1986-04-15  DATE OF ADMISSION:  03/26/2018  PRIMARY CARE PHYSICIAN: Patient, No Pcp Per   REQUESTING/REFERRING PHYSICIAN: Myrna BlazerSchaevitz, David Matthew, MD  CHIEF COMPLAINT:   Chief Complaint  Patient presents with  . Flank Pain    HISTORY OF PRESENT ILLNESS: Corey [name]  is a 32 y.o. male with a known history of bipolar 1 disorder and hypertension who is taking bunch NSAIDs presented initially to the emergency room with complaint of right-sided flank pain since earlier today.  Patient was evaluated in the ER and had a CT of per renal protocol which showed a right sided renal stone.  He also had some hydronephrosis.  While in the ER he started also throwing up some blood.  Patient had presented to the ED recently with complaint of dark-colored stools and at that time was discharged home on PPI and Carafate.  Patient's hemoglobin is currently stable.  He is complains of severe pain.  Also some nausea vomiting.  Denies any chest pain or palpitations. PAST MEDICAL HISTORY:   Past Medical History:  Diagnosis Date  . Bipolar 1 disorder (HCC)   . HTN (hypertension)     PAST SURGICAL HISTORY:  Past Surgical History:  Procedure Laterality Date  . KNEE ARTHROSCOPY      SOCIAL HISTORY:  Social History   Tobacco Use  . Smoking status: Former Games developermoker  . Smokeless tobacco: Current User  Substance Use Topics  . Alcohol use: No    FAMILY HISTORY: History reviewed. No pertinent family history.  DRUG ALLERGIES:  Allergies  Allergen Reactions  . Penicillins Hives    Has patient had a PCN reaction causing immediate rash, facial/tongue/throat swelling, SOB or lightheadedness with hypotension: Yes Has patient had a PCN reaction causing severe rash involving mucus membranes or skin necrosis: No Has patient had a PCN reaction that required hospitalization: No Has patient had a PCN  reaction occurring within the last 10 years: No If all of the above answers are "NO", then may proceed with Cephalosporin use.   . Sulfa Antibiotics Hives    REVIEW OF SYSTEMS:   CONSTITUTIONAL: No fever, fatigue or weakness.  EYES: No blurred or double vision.  EARS, NOSE, AND THROAT: No tinnitus or ear pain.  RESPIRATORY: No cough, shortness of breath, wheezing or hemoptysis.  CARDIOVASCULAR: No chest pain, orthopnea, edema.  GASTROINTESTINAL: No nausea, vomiting, diarrhea or positive abdominal pain.  Dark tarry stools GENITOURINARY: No dysuria, hematuria.  ENDOCRINE: No polyuria, nocturia,  HEMATOLOGY: No anemia, easy bruising or bleeding SKIN: No rash or lesion. MUSCULOSKELETAL: No joint pain or arthritis.   NEUROLOGIC: No tingling, numbness, weakness.  PSYCHIATRY: No anxiety or depression.   MEDICATIONS AT HOME:  Prior to Admission medications   Medication Sig Start Date End Date Taking? Authorizing Provider  clonazePAM (KLONOPIN) 1 MG tablet Take 1 mg by mouth 2 (two) times daily as needed for anxiety. 02/28/18  Yes [provider]  cloNIDine (CATAPRES) 0.1 MG tablet Take 0.1 mg by mouth 3 (three) times daily. 02/28/18  Yes [provider]  FLUoxetine (PROZAC) 40 MG capsule Take 40 mg by mouth daily. 02/28/18  Yes [provider]  hydrochlorothiazide (HYDRODIURIL) 25 MG tablet Take 1 tablet (25 mg total) by mouth daily. 01/22/18  Yes Minna AntisPaduchowski, Kevin, MD  oxyCODONE-acetaminophen (ROXICET) 5-325 MG per tablet Take 1 tablet by mouth every 4 (four)  hours as needed for severe pain. 12/30/14  Yes Irean Hong, MD  sucralfate (CARAFATE) 1 g tablet Take 1 tablet (1 g total) by mouth 4 (four) times daily. 03/20/18  Yes Phineas Semen, MD  famotidine (PEPCID) 40 MG tablet Take 1 tablet (40 mg total) by mouth every evening. Patient not taking: Reported on 03/26/2018 03/20/18 03/20/19  Phineas Semen, MD  ibuprofen (ADVIL,MOTRIN) 800 MG tablet Take 1 tablet (800 mg  total) by mouth every 8 (eight) hours as needed for moderate pain. Patient not taking: Reported on 03/26/2018 12/30/14   Irean Hong, MD      PHYSICAL EXAMINATION:   VITAL SIGNS: Blood pressure 129/74, temperature 97.8 F (36.6 C), temperature source Oral, height 5\' 11"  (1.803 m), weight 129 kg.  GENERAL:  32 y.o.-year-old patient lying in the bed with no acute distress.  EYES: Pupils equal, round, reactive to light and accommodation. No scleral icterus. Extraocular muscles intact.  HEENT: Head atraumatic, normocephalic. Oropharynx and nasopharynx clear.  NECK:  Supple, no jugular venous distention. No thyroid enlargement, no tenderness.  LUNGS: Normal breath sounds bilaterally, no wheezing, rales,rhonchi or crepitation. No use of accessory muscles of respiration.  CARDIOVASCULAR: S1, S2 normal. No murmurs, rubs, or gallops.  ABDOMEN: Soft, nontender, nondistended. Bowel sounds present. No organomegaly or mass.  EXTREMITIES: No pedal edema, cyanosis, or clubbing.  NEUROLOGIC: Cranial nerves II through XII are intact. Muscle strength 5/5 in all extremities. Sensation intact. Gait not checked.  PSYCHIATRIC: The patient is alert and oriented x 3.  SKIN: No obvious rash, lesion, or ulcer.   LABORATORY PANEL:   CBC Recent Labs  Lab 03/20/18 1857 03/26/18 1112  WBC 10.2 8.9  HGB 15.8 15.7  HCT 45.5 43.9  PLT 258 274  MCV 86.9 85.2  MCH 30.1 30.4  MCHC 34.6 35.7  RDW 13.5 13.1  LYMPHSABS  --  1.6  MONOABS  --  0.6  EOSABS  --  0.3  BASOSABS  --  0.0   ------------------------------------------------------------------------------------------------------------------  Chemistries  Recent Labs  Lab 03/20/18 1857 03/26/18 1112  NA 138 141  K 3.0* 3.5  CL 106 106  CO2 24 24  GLUCOSE 115* 118*  BUN 10 13  CREATININE 1.10 1.26*  CALCIUM 9.0 9.3  AST 25 26  ALT 20 32  ALKPHOS 73 77  BILITOT 0.6 1.1    ------------------------------------------------------------------------------------------------------------------ estimated creatinine clearance is 117.4 mL/min (A) (by C-G formula based on SCr of 1.26 mg/dL (H)). ------------------------------------------------------------------------------------------------------------------ No results for input(s): TSH, T4TOTAL, T3FREE, THYROIDAB in the last 72 hours.  Invalid input(s): FREET3   Coagulation profile No results for input(s): INR, PROTIME in the last 168 hours. ------------------------------------------------------------------------------------------------------------------- No results for input(s): DDIMER in the last 72 hours. -------------------------------------------------------------------------------------------------------------------  Cardiac Enzymes No results for input(s): CKMB, TROPONINI, MYOGLOBIN in the last 168 hours.  Invalid input(s): CK ------------------------------------------------------------------------------------------------------------------ Invalid input(s): POCBNP  ---------------------------------------------------------------------------------------------------------------  Urinalysis    Component Value Date/Time   COLORURINE AMBER (A) 03/26/2018 1112   APPEARANCEUR TURBID (A) 03/26/2018 1112   APPEARANCEUR Clear 10/04/2011 1553   LABSPEC 1.033 (H) 03/26/2018 1112   LABSPEC 1.023 10/04/2011 1553   PHURINE 5.0 03/26/2018 1112   GLUCOSEU NEGATIVE 03/26/2018 1112   GLUCOSEU Negative 10/04/2011 1553   HGBUR LARGE (A) 03/26/2018 1112   BILIRUBINUR SMALL (A) 03/26/2018 1112   BILIRUBINUR Negative 10/04/2011 1553   KETONESUR 5 (A) 03/26/2018 1112   PROTEINUR 100 (A) 03/26/2018 1112   NITRITE NEGATIVE 03/26/2018 1112   LEUKOCYTESUR NEGATIVE 03/26/2018 1112  LEUKOCYTESUR Negative 10/04/2011 1553     RADIOLOGY: Ct Renal Stone Study  Result Date: 03/26/2018 CLINICAL DATA:  Right flank/groin  pain, difficulty urinating EXAM: CT ABDOMEN AND PELVIS WITHOUT CONTRAST TECHNIQUE: Multidetector CT imaging of the abdomen and pelvis was performed following the standard protocol without IV contrast. COMPARISON:  None. FINDINGS: Lower chest: Lung bases are clear. Hepatobiliary: Unenhanced liver is unremarkable. Gallbladder is unremarkable. No intrahepatic or extrahepatic ductal dilatation. Pancreas: Within normal limits. Spleen: Within normal limits. Adrenals/Urinary Tract: Adrenal glands are within normal limits. Left kidney is within normal limits. Punctate calculus at the right UVJ (coronal image 88). Associated mild right hydroureteronephrosis. Bladder is underdistended. Stomach/Bowel: Stomach is within normal limits. No evidence of bowel obstruction. Normal appendix (series 2/image 54). Colon is decompressed. Vascular/Lymphatic: No evidence of abdominal aortic aneurysm. No suspicious abdominopelvic lymphadenopathy. Reproductive: Prostate is unremarkable. Other: No abdominopelvic ascites. Musculoskeletal: Visualized osseous structures are within normal limits. IMPRESSION: Punctate calculus at the right UVJ. Associated mild right hydroureteronephrosis. Electronically Signed   By: Charline Bills M.D.   On: 03/26/2018 11:41    EKG: Orders placed or performed during the hospital encounter of 01/22/18  . EKG 12-Lead  . EKG 12-Lead  . ED EKG within 10 minutes  . ED EKG within 10 minutes  . EKG    IMPRESSION AND PLAN: Patient is 32 year old presenting with severe right flank pain  1.  Right-sided flank pain due to renal stone We will give aggressive IV fluids.  Pain control Urology evaluation  2.  Possible upper GI bleed continue Protonix drip started in the ED I have spoken to the GI physician who will see the patient To follow hemoglobin transfuse as needed  3.  Bipolar disorder continue his home regimen  4.  Acute kidney injury give IV fluids monitor renal function  5.  SCDs for DVT  prophylaxis    All the records are reviewed and case discussed with ED provider. Management plans discussed with the patient, family and they are in agreement.  CODE STATUS:    TOTAL TIME TAKING CARE OF THIS PATIENT: 55 minutes.    Auburn Bilberry M.D on 03/26/2018 at 1:39 PM  Between 7am to 6pm - Pager - [phone]  After 6pm go to [email or web address] - password [email or web address] [facility]  [facility] Office  [phone]  CC: Primary care physician; Patient, No Pcp Per

## 2018-03-27 DIAGNOSIS — N200 Calculus of kidney: Secondary | ICD-10-CM | POA: Diagnosis not present

## 2018-03-27 LAB — CBC
HEMATOCRIT: 39 % — AB (ref 40.0–52.0)
Hemoglobin: 13.7 g/dL (ref 13.0–18.0)
MCH: 30.4 pg (ref 26.0–34.0)
MCHC: 35.2 g/dL (ref 32.0–36.0)
MCV: 86.1 fL (ref 80.0–100.0)
PLATELETS: 213 10*3/uL (ref 150–440)
RBC: 4.53 MIL/uL (ref 4.40–5.90)
RDW: 13.5 % (ref 11.5–14.5)
WBC: 7.9 10*3/uL (ref 3.8–10.6)

## 2018-03-27 LAB — BASIC METABOLIC PANEL
Anion gap: 4 — ABNORMAL LOW (ref 5–15)
BUN: 9 mg/dL (ref 6–20)
CALCIUM: 7.6 mg/dL — AB (ref 8.9–10.3)
CO2: 27 mmol/L (ref 22–32)
CREATININE: 1.15 mg/dL (ref 0.61–1.24)
Chloride: 109 mmol/L (ref 98–111)
Glucose, Bld: 99 mg/dL (ref 70–99)
Potassium: 3.2 mmol/L — ABNORMAL LOW (ref 3.5–5.1)
Sodium: 140 mmol/L (ref 135–145)

## 2018-03-27 LAB — HEMOGLOBIN: Hemoglobin: 14.4 g/dL (ref 13.0–18.0)

## 2018-03-27 MED ORDER — PANTOPRAZOLE SODIUM 40 MG PO TBEC: 40.0000 mg | DELAYED_RELEASE_TABLET | Freq: Every day | ORAL | 1 refills | Status: DC

## 2018-03-27 MED ORDER — TAMSULOSIN HCL 0.4 MG PO CAPS
0.4000 mg | ORAL_CAPSULE | Freq: Every day | ORAL | Status: DC
  Administered 2018-03-27: 0.4 mg via ORAL
  Filled 2018-03-27: qty 1

## 2018-03-27 MED ORDER — TAMSULOSIN HCL 0.4 MG PO CAPS: 0.4000 mg | ORAL_CAPSULE | Freq: Every day | ORAL | 0 refills | Status: AC

## 2018-03-27 NOTE — Progress Notes (Signed)
03/27/2018 2:26 PM  Corey [name] to be D/C'd Home per MD order.  Discussed prescriptions and follow up appointments with the patient. Prescriptions given to patient, medication list explained in detail. Pt verbalized understanding.  Allergies as of 03/27/2018      Reactions   Penicillins Hives   Has patient had a PCN reaction causing immediate rash, facial/tongue/throat swelling, SOB or lightheadedness with hypotension: Yes Has patient had a PCN reaction causing severe rash involving mucus membranes or skin necrosis: No Has patient had a PCN reaction that required hospitalization: No Has patient had a PCN reaction occurring within the last 10 years: No If all of the above answers are "NO", then may proceed with Cephalosporin use.   Sulfa Antibiotics Hives      Medication List    STOP taking these medications   famotidine 40 MG tablet Commonly known as:  PEPCID   ibuprofen 800 MG tablet Commonly known as:  ADVIL,MOTRIN     TAKE these medications   clonazePAM 1 MG tablet Commonly known as:  KLONOPIN Take 1 mg by mouth 2 (two) times daily as needed for anxiety.   cloNIDine 0.1 MG tablet Commonly known as:  CATAPRES Take 0.1 mg by mouth 3 (three) times daily.   FLUoxetine 40 MG capsule Commonly known as:  PROZAC Take 40 mg by mouth daily.   hydrochlorothiazide 25 MG tablet Commonly known as:  HYDRODIURIL Take 1 tablet (25 mg total) by mouth daily.   oxyCODONE-acetaminophen 5-325 MG tablet Commonly known as:  PERCOCET/ROXICET Take 1 tablet by mouth every 4 (four) hours as needed for severe pain.   pantoprazole 40 MG tablet Commonly known as:  PROTONIX Take 1 tablet (40 mg total) by mouth daily.   sucralfate 1 g tablet Commonly known as:  CARAFATE Take 1 tablet (1 g total) by mouth 4 (four) times daily.   tamsulosin 0.4 MG Caps capsule Commonly known as:  FLOMAX Take 1 capsule (0.4 mg total) by mouth daily.       Vitals:   03/26/18 2113 03/27/18 0436  BP:  102/75 (!) 96/57  Pulse: (!) 54 (!) 48  Resp: 16 16  Temp: (!) 97.5 F (36.4 C) 97.8 F (36.6 C)  SpO2: 99% 96%    Skin clean, dry and intact without evidence of skin break down, no evidence of skin tears noted. IV catheter discontinued intact. Site without signs and symptoms of complications. Dressing and pressure applied. Pt denies pain at this time. No complaints noted.  An After Visit Summary was printed and given to the patient. Patient escorted via WC, and D/C home via private auto.  Corey [name], Corey [name]

## 2018-03-27 NOTE — Discharge Summary (Signed)
[facility] - [place] at [facility]   PATIENT NAME: Corey [name]    MR#:  [id number]  DATE OF BIRTH:  10/23/1985  DATE OF ADMISSION:  03/26/2018 ADMITTING PHYSICIAN: Auburn Bilberry, MD  DATE OF DISCHARGE: 03/27/2018  2:46 PM  PRIMARY CARE PHYSICIAN: Patient, No Pcp Per    ADMISSION DIAGNOSIS:  Kidney stone [N20.0] Coffee ground emesis [K92.0]  DISCHARGE DIAGNOSIS:  Active Problems:   Kidney stone   SECONDARY DIAGNOSIS:   Past Medical History:  Diagnosis Date  . Bipolar 1 disorder (HCC)   . HTN (hypertension)     HOSPITAL COURSE:   32 year old male with past medical history of bipolar disorder, hypertension who presented to the hospital due to abdominal pain and coffee-ground emesis and noted to have nephrolithiasis with possible GI bleed.  1.  Nephrolithiasis-this was a cause of patient's abdominal pain.  Patient was treated supportively with IV fluids, antiemetics, pain control.  A urology consult was obtained.  Patient was seen by Dr. Lonna Cobb who recommended medical management rather than acute intervention.  Patient's renal function is stable, he is urinating well and his abdominal pain has improved. - He is being discharged home with follow-up with urology.  Patient was discharged on some Flomax.  2.  Suspected GI bleed-patient developed an episode of coffee-ground emesis secondary to his ongoing nausea from his nephrolithiasis. - His hemoglobin remained stable.  Patient was seen by gastroenterology and they did not recommend any intervention as he remained stable to had no further evidence of coffee-ground emesis. -Patient was discharged on PPI daily.  3.  Essential hypertension- patient will continue some clonidine and HCTZ.  4.  History of bipolar disorder-patient will continue his Klonopin, Prozac.  DISCHARGE CONDITIONS:   Stable  CONSULTS OBTAINED:  Treatment Team:  Riki Altes, MD  DRUG ALLERGIES:   Allergies  Allergen Reactions  .  Penicillins Hives    Has patient had a PCN reaction causing immediate rash, facial/tongue/throat swelling, SOB or lightheadedness with hypotension: Yes Has patient had a PCN reaction causing severe rash involving mucus membranes or skin necrosis: No Has patient had a PCN reaction that required hospitalization: No Has patient had a PCN reaction occurring within the last 10 years: No If all of the above answers are "NO", then may proceed with Cephalosporin use.   . Sulfa Antibiotics Hives    DISCHARGE MEDICATIONS:   Allergies as of 03/27/2018      Reactions   Penicillins Hives   Has patient had a PCN reaction causing immediate rash, facial/tongue/throat swelling, SOB or lightheadedness with hypotension: Yes Has patient had a PCN reaction causing severe rash involving mucus membranes or skin necrosis: No Has patient had a PCN reaction that required hospitalization: No Has patient had a PCN reaction occurring within the last 10 years: No If all of the above answers are "NO", then may proceed with Cephalosporin use.   Sulfa Antibiotics Hives      Medication List    STOP taking these medications   famotidine 40 MG tablet Commonly known as:  PEPCID   ibuprofen 800 MG tablet Commonly known as:  ADVIL,MOTRIN     TAKE these medications   clonazePAM 1 MG tablet Commonly known as:  KLONOPIN Take 1 mg by mouth 2 (two) times daily as needed for anxiety.   cloNIDine 0.1 MG tablet Commonly known as:  CATAPRES Take 0.1 mg by mouth 3 (three) times daily.   FLUoxetine 40 MG capsule Commonly known as:  PROZAC  Take 40 mg by mouth daily.   hydrochlorothiazide 25 MG tablet Commonly known as:  HYDRODIURIL Take 1 tablet (25 mg total) by mouth daily.   oxyCODONE-acetaminophen 5-325 MG tablet Commonly known as:  PERCOCET/ROXICET Take 1 tablet by mouth every 4 (four) hours as needed for severe pain.   pantoprazole 40 MG tablet Commonly known as:  PROTONIX Take 1 tablet (40 mg total) by  mouth daily.   sucralfate 1 g tablet Commonly known as:  CARAFATE Take 1 tablet (1 g total) by mouth 4 (four) times daily.   tamsulosin 0.4 MG Caps capsule Commonly known as:  FLOMAX Take 1 capsule (0.4 mg total) by mouth daily.         DISCHARGE INSTRUCTIONS:   DIET:  Cardiac diet  DISCHARGE CONDITION:  Stable  ACTIVITY:  Activity as tolerated  OXYGEN:  Home Oxygen: No.   Oxygen Delivery: room air  DISCHARGE LOCATION:  home   If you experience worsening of your admission symptoms, develop shortness of breath, life threatening emergency, suicidal or homicidal thoughts you must seek medical attention immediately by calling 911 or calling your MD immediately  if symptoms less severe.  You Must read complete instructions/literature along with all the possible adverse reactions/side effects for all the Medicines you take and that have been prescribed to you. Take any new Medicines after you have completely understood and accpet all the possible adverse reactions/side effects.   Please note  You were cared for by a hospitalist during your hospital stay. If you have any questions about your discharge medications or the care you received while you were in the hospital after you are discharged, you can call the unit and asked to speak with the hospitalist on call if the hospitalist that took care of you is not available. Once you are discharged, your primary care physician will handle any further medical issues. Please note that NO REFILLS for any discharge medications will be authorized once you are discharged, as it is imperative that you return to your primary care physician (or establish a relationship with a primary care physician if you do not have one) for your aftercare needs so that they can reassess your need for medications and monitor your lab values.     Today   Hg. Stable. No abdominal pain, no further vomiting or Coffee-ground emesis this a.m. Will d/c home later  today if tolerating PO well and pt. Is o.k. With plan.   VITAL SIGNS:  Blood pressure (!) 96/57, pulse (!) 48, temperature 97.8 F (36.6 C), resp. rate 16, height 5\' 11"  (1.803 m), weight 129 kg, SpO2 96 %.  I/O:    Intake/Output Summary (Last 24 hours) at 03/27/2018 1559 Last data filed at 03/27/2018 [id number] Gross per 24 hour  Intake 2188.75 ml  Output 1550 ml  Net 638.75 ml    PHYSICAL EXAMINATION:  GENERAL:  32 y.o.-year-old patient lying in the bed with no acute distress.  EYES: Pupils equal, round, reactive to light and accommodation. No scleral icterus. Extraocular muscles intact.  HEENT: Head atraumatic, normocephalic. Oropharynx and nasopharynx clear.  NECK:  Supple, no jugular venous distention. No thyroid enlargement, no tenderness.  LUNGS: Normal breath sounds bilaterally, no wheezing, rales,rhonchi. No use of accessory muscles of respiration.  CARDIOVASCULAR: S1, S2 normal. No murmurs, rubs, or gallops.  ABDOMEN: Soft, non-tender, non-distended. Bowel sounds present. No organomegaly or mass.  EXTREMITIES: No pedal edema, cyanosis, or clubbing.  NEUROLOGIC: Cranial nerves II through XII are intact. No focal  motor or sensory defecits b/l.  PSYCHIATRIC: The patient is alert and oriented x 3. Good affect.  SKIN: No obvious rash, lesion, or ulcer.   DATA REVIEW:   CBC Recent Labs  Lab 03/27/18 0541 03/27/18 1355  WBC 7.9  --   HGB 13.7 14.4  HCT 39.0*  --   PLT 213  --     Chemistries  Recent Labs  Lab 03/26/18 1112 03/27/18 0541  NA 141 140  K 3.5 3.2*  CL 106 109  CO2 24 27  GLUCOSE 118* 99  BUN 13 9  CREATININE 1.26* 1.15  CALCIUM 9.3 7.6*  AST 26  --   ALT 32  --   ALKPHOS 77  --   BILITOT 1.1  --     Cardiac Enzymes No results for input(s): TROPONINI in the last 168 hours.  Microbiology Results  No results found for this or any previous visit.  RADIOLOGY:  Ct Renal Stone Study  Result Date: 03/26/2018 CLINICAL DATA:  Right flank/groin  pain, difficulty urinating EXAM: CT ABDOMEN AND PELVIS WITHOUT CONTRAST TECHNIQUE: Multidetector CT imaging of the abdomen and pelvis was performed following the standard protocol without IV contrast. COMPARISON:  None. FINDINGS: Lower chest: Lung bases are clear. Hepatobiliary: Unenhanced liver is unremarkable. Gallbladder is unremarkable. No intrahepatic or extrahepatic ductal dilatation. Pancreas: Within normal limits. Spleen: Within normal limits. Adrenals/Urinary Tract: Adrenal glands are within normal limits. Left kidney is within normal limits. Punctate calculus at the right UVJ (coronal image 88). Associated mild right hydroureteronephrosis. Bladder is underdistended. Stomach/Bowel: Stomach is within normal limits. No evidence of bowel obstruction. Normal appendix (series 2/image 54). Colon is decompressed. Vascular/Lymphatic: No evidence of abdominal aortic aneurysm. No suspicious abdominopelvic lymphadenopathy. Reproductive: Prostate is unremarkable. Other: No abdominopelvic ascites. Musculoskeletal: Visualized osseous structures are within normal limits. IMPRESSION: Punctate calculus at the right UVJ. Associated mild right hydroureteronephrosis. Electronically Signed   By: Charline Bills M.D.   On: 03/26/2018 11:41      Management plans discussed with the patient, family and they are in agreement.  CODE STATUS:     Code Status Orders  (From admission, onward)         Start     Ordered   03/26/18 1508  Full code  Continuous     03/26/18 1507        Code Status History    This patient has a current code status but no historical code status.      TOTAL TIME TAKING CARE OF THIS PATIENT: 40 minutes.    Houston Siren M.D on 03/27/2018 at 3:59 PM  Between 7am to 6pm - Pager - [phone]  After 6pm go to [email or web address] - Social research officer, government  [facility] [place] [facility]  Office  [phone]  CC: Primary care physician; Patient, No Pcp Per

## 2018-03-27 NOTE — Consult Note (Signed)
Urology Consult  I have been asked to see the patient by Dr. Allena Katz, for evaluation and management of renal colic.  Chief Complaint: Pain  History of Present Illness: Corey [name] is a 32 y.o. year old male who was transported to the ED yesterday morning by EMS with acute onset of severe right lower quadrant pain radiating to the right hemiscrotum.  The intensity was rated 9/10.  He had associated nausea and vomiting.  He also complained of urinary frequency and sensation of incomplete emptying.  He was given 50 mcg of fentanyl by EMS without relief of his pain.  A stone protocol CT of the abdomen pelvis was performed which showed an ~ 1mm right UVJ stone with mild right hydronephrosis/hydroureter.  He had persistent pain despite parenteral Toradol, fentanyl and morphine.  He was admitted for pain control.  He denies previous history of stone disease or prior urologic problems.  Past Medical History:  Diagnosis Date  . Bipolar 1 disorder (HCC)   . HTN (hypertension)     Past Surgical History:  Procedure Laterality Date  . KNEE ARTHROSCOPY      Home Medications:  Current Meds  Medication Sig  . clonazePAM (KLONOPIN) 1 MG tablet Take 1 mg by mouth 2 (two) times daily as needed for anxiety.  . cloNIDine (CATAPRES) 0.1 MG tablet Take 0.1 mg by mouth 3 (three) times daily.  Marland Kitchen FLUoxetine (PROZAC) 40 MG capsule Take 40 mg by mouth daily.  . hydrochlorothiazide (HYDRODIURIL) 25 MG tablet Take 1 tablet (25 mg total) by mouth daily.  Marland Kitchen oxyCODONE-acetaminophen (ROXICET) 5-325 MG per tablet Take 1 tablet by mouth every 4 (four) hours as needed for severe pain.  Marland Kitchen sucralfate (CARAFATE) 1 g tablet Take 1 tablet (1 g total) by mouth 4 (four) times daily.    Allergies:  Allergies  Allergen Reactions  . Penicillins Hives    Has patient had a PCN reaction causing immediate rash, facial/tongue/throat swelling, SOB or lightheadedness with hypotension: Yes Has patient had a PCN reaction  causing severe rash involving mucus membranes or skin necrosis: No Has patient had a PCN reaction that required hospitalization: No Has patient had a PCN reaction occurring within the last 10 years: No If all of the above answers are "NO", then may proceed with Cephalosporin use.   . Sulfa Antibiotics Hives    History reviewed. No pertinent family history.  Social History:  reports that he has quit smoking. His smokeless tobacco use includes snuff. He reports that he does not drink alcohol or use drugs.  ROS: A complete review of systems was performed.  All systems are negative except for pertinent findings as noted.  Physical Exam:  Vital signs in last 24 hours: Temp:  [97.5 F (36.4 C)-97.8 F (36.6 C)] 97.8 F (36.6 C) (08/26 0436) Pulse Rate:  [48-67] 48 (08/26 0436) Resp:  [16-17] 16 (08/26 0436) BP: (96-140)/(57-75) 96/57 (08/26 0436) SpO2:  [96 %-99 %] 96 % (08/26 0436) Weight:  [161 kg] 129 kg (08/25 1143) Constitutional:  Alert and oriented, No acute distress Neurologic: Grossly intact, no focal deficits, moving all 4 extremities Psychiatric: Normal mood and affect   Laboratory Data:  Recent Labs    03/26/18 1112 03/26/18 1518 03/26/18 2207 03/27/18 0541  WBC 8.9  --   --  7.9  HGB 15.7 15.1 13.6 13.7  HCT 43.9  --   --  39.0*   Recent Labs    03/26/18 1112 03/27/18 0541  NA 141 140  K 3.5 3.2*  CL 106 109  CO2 24 27  GLUCOSE 118* 99  BUN 13 9  CREATININE 1.26* 1.15  CALCIUM 9.3 7.6*    Radiologic Imaging: Ct Renal Stone Study  Result Date: 03/26/2018 CLINICAL DATA:  Right flank/groin pain, difficulty urinating EXAM: CT ABDOMEN AND PELVIS WITHOUT CONTRAST TECHNIQUE: Multidetector CT imaging of the abdomen and pelvis was performed following the standard protocol without IV contrast. COMPARISON:  None. FINDINGS: Lower chest: Lung bases are clear. Hepatobiliary: Unenhanced liver is unremarkable. Gallbladder is unremarkable. No intrahepatic or  extrahepatic ductal dilatation. Pancreas: Within normal limits. Spleen: Within normal limits. Adrenals/Urinary Tract: Adrenal glands are within normal limits. Left kidney is within normal limits. Punctate calculus at the right UVJ (coronal image 88). Associated mild right hydroureteronephrosis. Bladder is underdistended. Stomach/Bowel: Stomach is within normal limits. No evidence of bowel obstruction. Normal appendix (series 2/image 54). Colon is decompressed. Vascular/Lymphatic: No evidence of abdominal aortic aneurysm. No suspicious abdominopelvic lymphadenopathy. Reproductive: Prostate is unremarkable. Other: No abdominopelvic ascites. Musculoskeletal: Visualized osseous structures are within normal limits. IMPRESSION: Punctate calculus at the right UVJ. Associated mild right hydroureteronephrosis. Electronically Signed   By: Charline BillsSriyesh  Krishnan M.D.   On: 03/26/2018 11:41    Impression/Assessment:  32 year old male with right renal colic secondary to a punctate right UVJ calculus.  This stone has a greater than 90% chance of passing spontaneously.  His last pain medication was 1600 yesterday.  Recommendation:  1.  Medical expulsive therapy, add tamsulosin 0.4 mg daily 2.  If pain managed with oral analgesics would discharge and [facility].  03/27/2018, 7:49 AM  Irineo AxonScott Stoioff,  MD

## 2018-03-27 NOTE — Care Management (Signed)
Follow up appointment made for patient with Dr. Lonna CobbStoioff: [facility] [address]  [address]  Appointment 04/10/18 at 10:30AM

## 2018-03-28 LAB — HIV ANTIBODY (ROUTINE TESTING W REFLEX): HIV SCREEN 4TH GENERATION: NONREACTIVE

## 2018-04-01 ENCOUNTER — Emergency Department
Admission: EM | Admit: 2018-04-01 | Discharge: 2018-04-01 | Disposition: A | Discharge status: Home / Self Care | Payer: BLUE CROSS/BLUE SHIELD | Attending: Emergency Medicine | Admitting: Emergency Medicine

## 2018-04-01 ENCOUNTER — Other Ambulatory Visit: Payer: Self-pay

## 2018-04-01 ENCOUNTER — Encounter: Payer: Self-pay | Admitting: Emergency Medicine

## 2018-04-01 DIAGNOSIS — K2970 Gastritis, unspecified, without bleeding: Secondary | ICD-10-CM

## 2018-04-01 DIAGNOSIS — Z79899 Other long term (current) drug therapy: Secondary | ICD-10-CM | POA: Diagnosis not present

## 2018-04-01 DIAGNOSIS — F17220 Nicotine dependence, chewing tobacco, uncomplicated: Secondary | ICD-10-CM | POA: Insufficient documentation

## 2018-04-01 DIAGNOSIS — I10 Essential (primary) hypertension: Secondary | ICD-10-CM | POA: Insufficient documentation

## 2018-04-01 DIAGNOSIS — R1012 Left upper quadrant pain: Secondary | ICD-10-CM | POA: Diagnosis present

## 2018-04-01 DIAGNOSIS — R1010 Upper abdominal pain, unspecified: Secondary | ICD-10-CM

## 2018-04-01 LAB — CBC
HEMATOCRIT: 46.5 % (ref 40.0–52.0)
Hemoglobin: 16.3 g/dL (ref 13.0–18.0)
MCH: 30.3 pg (ref 26.0–34.0)
MCHC: 35 g/dL (ref 32.0–36.0)
MCV: 86.6 fL (ref 80.0–100.0)
PLATELETS: 270 10*3/uL (ref 150–440)
RBC: 5.37 MIL/uL (ref 4.40–5.90)
RDW: 13.3 % (ref 11.5–14.5)
WBC: 7.9 10*3/uL (ref 3.8–10.6)

## 2018-04-01 LAB — COMPREHENSIVE METABOLIC PANEL
ALT: 28 U/L (ref 0–44)
AST: 22 U/L (ref 15–41)
Albumin: 4.1 g/dL (ref 3.5–5.0)
Alkaline Phosphatase: 74 U/L (ref 38–126)
Anion gap: 8 (ref 5–15)
BUN: 18 mg/dL (ref 6–20)
CHLORIDE: 106 mmol/L (ref 98–111)
CO2: 25 mmol/L (ref 22–32)
CREATININE: 1.17 mg/dL (ref 0.61–1.24)
Calcium: 9 mg/dL (ref 8.9–10.3)
GFR calc non Af Amer: >60 mL/min (ref >60)
Glucose, Bld: 119 mg/dL — ABNORMAL HIGH (ref 70–99)
POTASSIUM: 3.5 mmol/L (ref 3.5–5.1)
SODIUM: 139 mmol/L (ref 135–145)
Total Bilirubin: 0.6 mg/dL (ref 0.3–1.2)
Total Protein: 7.6 g/dL (ref 6.5–8.1)

## 2018-04-01 LAB — URINALYSIS, COMPLETE (UACMP) WITH MICROSCOPIC
BILIRUBIN URINE: NEGATIVE
Bacteria, UA: NONE SEEN
Glucose, UA: NEGATIVE mg/dL
HGB URINE DIPSTICK: NEGATIVE
KETONES UR: NEGATIVE mg/dL
Leukocytes, UA: NEGATIVE
Nitrite: NEGATIVE
Protein, ur: 30 mg/dL — AB
SPECIFIC GRAVITY, URINE: 1.03 (ref 1.005–1.030)
pH: 5 (ref 5.0–8.0)

## 2018-04-01 LAB — LIPASE, BLOOD: LIPASE: 30 U/L (ref 11–51)

## 2018-04-01 MED ORDER — SUCRALFATE 1 G PO TABS: 1.0000 g | ORAL_TABLET | Freq: Four times a day (QID) | ORAL | 0 refills | Status: DC

## 2018-04-01 NOTE — ED Provider Notes (Signed)
[facility] Emergency Department Provider Note  ____________________________________________  Time seen: Approximately 4:43 PM  I have reviewed the triage vital signs and the nursing notes.   HISTORY  Chief Complaint Abdominal Pain and Melena    HPI Corey [name] is a 32 y.o. male with a history of bipolar disorder and hypertension who  complains of black stools today.  Reports constant upper abdominal pain is waxing waning without aggravating or alleviating factors.  Not worse with eating, not improved.  Taking all his home medications including Protonix that was prescribed to him 6 days ago.  No nausea or vomiting.  Pain is nonradiating.  He reports 5 black bowel movements today.  During interview he seems unsure of his answers and seems to be guessing  Review of electronic medical record shows that he was in the hospital for monitoring of hematemesis 6 days ago.  He was evaluated by gastroenterology who did not feel that any intervention was warranted other than starting a PPI.  His vital signs and hemoglobin had remained stable at that time and he did not have any signs of bleeding in the hospital.     Past Medical History:  Diagnosis Date  . Bipolar 1 disorder (HCC)   . HTN (hypertension)      Patient Active Problem List   Diagnosis Date Noted  . Kidney stone 03/26/2018     Past Surgical History:  Procedure Laterality Date  . KNEE ARTHROSCOPY       Prior to Admission medications   Medication Sig Start Date End Date Taking? Authorizing Provider  clonazePAM (KLONOPIN) 1 MG tablet Take 1 mg by mouth 2 (two) times daily as needed for anxiety. 02/28/18   [provider]  cloNIDine (CATAPRES) 0.1 MG tablet Take 0.1 mg by mouth 3 (three) times daily. 02/28/18   [provider]  FLUoxetine (PROZAC) 40 MG capsule Take 40 mg by mouth daily. 02/28/18   [provider]  hydrochlorothiazide (HYDRODIURIL) 25 MG tablet Take 1  tablet (25 mg total) by mouth daily. 01/22/18   Minna Antis, MD  oxyCODONE-acetaminophen (ROXICET) 5-325 MG per tablet Take 1 tablet by mouth every 4 (four) hours as needed for severe pain. 12/30/14   Irean Hong, MD  pantoprazole (PROTONIX) 40 MG tablet Take 1 tablet (40 mg total) by mouth daily. 03/27/18 05/26/18  Houston Siren, MD  sucralfate (CARAFATE) 1 g tablet Take 1 tablet (1 g total) by mouth 4 (four) times daily. 04/01/18   Sharman Cheek, MD  tamsulosin (FLOMAX) 0.4 MG CAPS capsule Take 1 capsule (0.4 mg total) by mouth daily. 03/27/18 04/26/18  Houston Siren, MD     Allergies Penicillins and Sulfa antibiotics   No family history on file.  Social History Social History   Tobacco Use  . Smoking status: Former Games developer  . Smokeless tobacco: Current User    Types: Snuff  Substance Use Topics  . Alcohol use: No  . Drug use: Never    Review of Systems  Constitutional:   No fever or chills.  ENT:   No sore throat. No rhinorrhea. Cardiovascular:   No chest pain or syncope. Respiratory:   No dyspnea or cough. Gastrointestinal: Positive as above for abdominal pain without vomiting and diarrhea.  Musculoskeletal:   Negative for focal pain or swelling All other systems reviewed and are negative except as documented above in ROS and HPI.  ____________________________________________   PHYSICAL EXAM:  VITAL SIGNS: ED Triage Vitals  Enc  Vitals Group     BP 04/01/18 1259 119/72     Pulse Rate 04/01/18 1259 68     Resp 04/01/18 1259 18     Temp 04/01/18 1259 98.4 F (36.9 C)     Temp Source 04/01/18 1259 Oral     SpO2 04/01/18 1259 99 %     Weight 04/01/18 1300 280 lb (127 kg)     Height 04/01/18 1300 5\' 11"  (1.803 m)     Head Circumference --      Peak Flow --      Pain Score 04/01/18 1259 8     Pain Loc --      Pain Edu? --      Excl. in GC? --     Vital signs reviewed, nursing assessments reviewed.   Constitutional:   Alert and oriented.  Non-toxic appearance. Eyes:   Conjunctivae are normal. EOMI. PERRL. ENT      Head:   Normocephalic and atraumatic.      Nose:   No congestion/rhinnorhea.       Mouth/Throat:   MMM, no pharyngeal erythema. No peritonsillar mass.       Neck:   No meningismus. Full ROM. Hematological/Lymphatic/Immunilogical:   No cervical lymphadenopathy. Cardiovascular:   RRR. Symmetric bilateral radial and DP pulses.  No murmurs. Cap refill less than 2 seconds. Respiratory:   Normal respiratory effort without tachypnea/retractions. Breath sounds are clear and equal bilaterally. No wheezes/rales/rhonchi. Gastrointestinal:   Soft with left upper quadrant tenderness.  Non distended. There is no CVA tenderness.  No rebound, rigidity, or guarding.  Rectal exam shows no stool, Hemoccult negative Musculoskeletal:   Normal range of motion in all extremities. No joint effusions.  No lower extremity tenderness.  No edema. Neurologic:   Normal speech and language.  Motor grossly intact. No acute focal neurologic deficits are appreciated.  Skin:    Skin is warm, dry and intact. No rash noted.  No petechiae, purpura, or bullae.  ____________________________________________    LABS (pertinent positives/negatives) (all labs ordered are listed, but only abnormal results are displayed) Labs Reviewed  COMPREHENSIVE METABOLIC PANEL - Abnormal; Notable for the following components:      Result Value   Glucose, Bld 119 (*)    All other components within normal limits  URINALYSIS, COMPLETE (UACMP) WITH MICROSCOPIC - Abnormal; Notable for the following components:   Color, Urine AMBER (*)    APPearance HAZY (*)    Protein, ur 30 (*)    All other components within normal limits  LIPASE, BLOOD  CBC   ____________________________________________   EKG    ____________________________________________    RADIOLOGY  No results  found.  ____________________________________________   PROCEDURES Procedures  ____________________________________________    CLINICAL IMPRESSION / ASSESSMENT AND PLAN / ED COURSE  Pertinent labs & imaging results that were available during my care of the patient were reviewed by me and considered in my medical decision making (see chart for details).    Patient not in distress, vital signs unremarkable, complains of seeing black stool earlier today.  No evidence of GI bleed at this time.  Vital signs are unremarkable.  Hemoglobin is stable.  Recommended continued Protonix, add on Carafate, follow-up with gastroenterology in 1 week if symptoms do not resolve.      ____________________________________________   FINAL CLINICAL IMPRESSION(S) / ED DIAGNOSES    Final diagnoses:  Pain of upper abdomen  Gastritis without bleeding, unspecified chronicity, unspecified gastritis type     ED Discharge  Orders         Ordered    sucralfate (CARAFATE) 1 g tablet  4 times daily     04/01/18 1641          Portions of this note were generated with dragon dictation software. Dictation errors may occur despite best attempts at proofreading.    Sharman Cheek, MD 04/01/18 1650

## 2018-04-01 NOTE — ED Triage Notes (Signed)
Began mid abdominal pain and black stools this am.

## 2018-04-01 NOTE — ED Notes (Signed)
Pt reports taking clonidine and states his HR has been low ever since taking this medication. This nurse present during rectal exam with Dr. Scotty CourtStafford.

## 2018-04-01 NOTE — ED Notes (Signed)
Dr. Scotty CourtStafford aware of pts current HR. States to continue with discharge at this time.

## 2018-04-01 NOTE — Discharge Instructions (Signed)
Continue taking all your medications including the pantoprazole. Add sucralfate at least twice a day.  If your symptoms have not resolved within 1 week, follow up with gastroenterology.

## 2018-04-10 ENCOUNTER — Ambulatory Visit: Payer: BLUE CROSS/BLUE SHIELD | Admitting: Urology

## 2018-04-10 ENCOUNTER — Encounter: Payer: Self-pay | Admitting: Urology

## 2018-04-10 VITALS — BP 103/69 | HR 80 | Ht 71.0 in | Wt 268.8 lb

## 2018-04-10 DIAGNOSIS — N200 Calculus of kidney: Secondary | ICD-10-CM

## 2018-04-10 NOTE — Progress Notes (Addendum)
   04/10/2018 10:43 AM   Corey [name] 05/04/1986 [id number]  CC: Right 62mm UVJ stone  HPI: I had the pleasure of seeing Corey [name] in urology clinic today for follow-up of right flank pain.  He is a 32 year old male with bipolar disorder who was admitted to the hospital for pain control on 8/25 with a punctate 1 mm right UVJ ureteral stone and upstream mild hydronephrosis on CT scan.  He was seen by my partner Dr. Lonna Cobb and started on Flomax for medical expulsive therapy.  He denies any flank pain or fever currently and is doing well.  He does report occasional urinary frequency and feeling of incomplete emptying.  He has not been straining his urine.  There are no aggravating or alleviating factors.  Severity is mild.  He denies prior stone events.  There is a positive family history of stone disease in his grandfather.   PMH: Past Medical History:  Diagnosis Date  . Bipolar 1 disorder (HCC)   . HTN (hypertension)     Surgical History: Past Surgical History:  Procedure Laterality Date  . KNEE ARTHROSCOPY      Allergies:  Allergies  Allergen Reactions  . Penicillins Hives    Has patient had a PCN reaction causing immediate rash, facial/tongue/throat swelling, SOB or lightheadedness with hypotension: Yes Has patient had a PCN reaction causing severe rash involving mucus membranes or skin necrosis: No Has patient had a PCN reaction that required hospitalization: No Has patient had a PCN reaction occurring within the last 10 years: No If all of the above answers are "NO", then may proceed with Cephalosporin use.   . Sulfa Antibiotics Hives    Family History: History reviewed. No pertinent family history.  Social History:  reports that he has quit smoking. His smokeless tobacco use includes snuff. He reports that he does not drink alcohol or use drugs.  ROS: Please see flowsheet from today's date for complete review of systems.  Physical Exam: There were no vitals taken  for this visit.   Constitutional:  Alert and oriented, No acute distress. Cardiovascular: No clubbing, cyanosis, or edema. Respiratory: Normal respiratory effort, no increased work of breathing. GI: Abdomen is soft, nontender, nondistended, no abdominal masses GU: No CVA tenderness Lymph: No cervical or inguinal lymphadenopathy. Skin: No rashes, bruises or suspicious lesions. Neurologic: Grossly intact, no focal deficits, moving all 4 extremities. Psychiatric: Normal mood and affect.  Pertinent Imaging: I have personally reviewed the CT stone protocol 03/26/2018: There is a punctate right 1 mm distal ureteral stone with upstream hydronephrosis.  No other urolithiasis.  Assessment & Plan:   In summary, Corey [name] is a 32 year old male with bipolar disorder who was hospitalized on 03/26/2017 with flank pain from a 1 mm right distal ureteral stone.  His flank pain has since resolved, and he has only mild urinary frequency currently.  There are no red cells on urinalysis today.  I suspect he has likely passed this stone.  We discussed general stone prevention strategies including adequate hydration with goal of producing 2.5 L of urine daily, increasing citric acid intake, increasing calcium intake during high oxalate meals, minimizing animal protein, and decreasing salt intake.   Follow up in 4-5 weeks with renal ultrasound prior to confirm resolution of right hydronephrosis  Sondra Come, MD  [facility] [address] [place], [place] [address] [phone]

## 2018-04-11 LAB — URINALYSIS, COMPLETE
BILIRUBIN UA: NEGATIVE
Glucose, UA: NEGATIVE
Leukocytes, UA: NEGATIVE
Nitrite, UA: NEGATIVE
PH UA: 5.5 (ref 5.0–7.5)
RBC, UA: NEGATIVE
Specific Gravity, UA: >1.03 — ABNORMAL HIGH (ref 1.005–1.030)
Urobilinogen, Ur: 1 mg/dL (ref 0.2–1.0)

## 2018-04-11 LAB — MICROSCOPIC EXAMINATION
Epithelial Cells (non renal): NONE SEEN /hpf (ref 0–10)
RBC, UA: NONE SEEN /hpf (ref 0–2)

## 2018-04-17 ENCOUNTER — Encounter: Payer: Self-pay | Admitting: Emergency Medicine

## 2018-04-17 ENCOUNTER — Emergency Department
Admission: EM | Admit: 2018-04-17 | Discharge: 2018-04-17 | Disposition: A | Discharge status: Home / Self Care | Payer: BLUE CROSS/BLUE SHIELD | Attending: Emergency Medicine | Admitting: Emergency Medicine

## 2018-04-17 DIAGNOSIS — K920 Hematemesis: Secondary | ICD-10-CM | POA: Insufficient documentation

## 2018-04-17 DIAGNOSIS — I10 Essential (primary) hypertension: Secondary | ICD-10-CM | POA: Diagnosis not present

## 2018-04-17 DIAGNOSIS — F319 Bipolar disorder, unspecified: Secondary | ICD-10-CM | POA: Diagnosis not present

## 2018-04-17 DIAGNOSIS — Z87891 Personal history of nicotine dependence: Secondary | ICD-10-CM | POA: Insufficient documentation

## 2018-04-17 DIAGNOSIS — R1110 Vomiting, unspecified: Secondary | ICD-10-CM | POA: Diagnosis present

## 2018-04-17 DIAGNOSIS — Z79899 Other long term (current) drug therapy: Secondary | ICD-10-CM | POA: Diagnosis not present

## 2018-04-17 LAB — COMPREHENSIVE METABOLIC PANEL
ALBUMIN: 4.2 g/dL (ref 3.5–5.0)
ALK PHOS: 64 U/L (ref 38–126)
ALT: 28 U/L (ref 0–44)
AST: 24 U/L (ref 15–41)
Anion gap: 9 (ref 5–15)
BILIRUBIN TOTAL: 0.6 mg/dL (ref 0.3–1.2)
BUN: 15 mg/dL (ref 6–20)
CALCIUM: 9.1 mg/dL (ref 8.9–10.3)
CO2: 26 mmol/L (ref 22–32)
CREATININE: 1.18 mg/dL (ref 0.61–1.24)
Chloride: 105 mmol/L (ref 98–111)
GFR calc Af Amer: >60 mL/min (ref >60)
GFR calc non Af Amer: >60 mL/min (ref >60)
Glucose, Bld: 97 mg/dL (ref 70–99)
Potassium: 3 mmol/L — ABNORMAL LOW (ref 3.5–5.1)
Sodium: 140 mmol/L (ref 135–145)
TOTAL PROTEIN: 7.3 g/dL (ref 6.5–8.1)

## 2018-04-17 LAB — CBC
HCT: 45.1 % (ref 40.0–52.0)
Hemoglobin: 15.5 g/dL (ref 13.0–18.0)
MCH: 29.8 pg (ref 26.0–34.0)
MCHC: 34.4 g/dL (ref 32.0–36.0)
MCV: 86.5 fL (ref 80.0–100.0)
Platelets: 232 10*3/uL (ref 150–440)
RBC: 5.21 MIL/uL (ref 4.40–5.90)
RDW: 13.3 % (ref 11.5–14.5)
WBC: 12.6 10*3/uL — ABNORMAL HIGH (ref 3.8–10.6)

## 2018-04-17 LAB — TYPE AND SCREEN
ABO/RH(D): B POS
ANTIBODY SCREEN: NEGATIVE

## 2018-04-17 LAB — CK: Total CK: 154 U/L (ref 49–397)

## 2018-04-17 MED ORDER — SODIUM CHLORIDE 0.9 % IV BOLUS
1000.0000 mL | Freq: Once | INTRAVENOUS | Status: AC
  Administered 2018-04-17: 1000 mL via INTRAVENOUS

## 2018-04-17 NOTE — ED Provider Notes (Signed)
[facility] Emergency Department Provider Note  ____________________________________________   I have reviewed the triage vital signs and the nursing notes. Where available I have reviewed prior notes and, if possible and indicated, outside hospital notes.    HISTORY  Chief Complaint Hematemesis    HPI Corey [name] is a 32 y.o. male   History of bipolar disorder and kidney stones, hypertension, states that he was at work and he got hot and he threw up once or twice, trivial amounts of vomiting, he states he felt like he just felt hot.  He feels better now, this happened early this morning.  He noticed a trace of blood and there want to make sure he was okay as was recently admitted for similar.  Patient had stable hemoglobins during his prior admission, he is to follow-up with GI for this.  He denies any melena or bright red blood per rectum, normal bowel movements.  Denies any chest pain or shortness of breath.  He states he just throwing up sometimes when he gets hot and that happened to him today.  He does not feel that he is in significant dehydration.  He states that his blood work is okay he wants to go home.  Denies any abdominal pain, he states it was just "a little spit up".   L  Past Medical History:  Diagnosis Date  . Bipolar 1 disorder (HCC)   . HTN (hypertension)     Patient Active Problem List   Diagnosis Date Noted  . Kidney stone 03/26/2018    Past Surgical History:  Procedure Laterality Date  . KNEE ARTHROSCOPY      Prior to Admission medications   Medication Sig Start Date End Date Taking? Authorizing Provider  clonazePAM (KLONOPIN) 1 MG tablet Take 1 mg by mouth 2 (two) times daily as needed for anxiety. 02/28/18   [provider]  cloNIDine (CATAPRES) 0.1 MG tablet Take 0.1 mg by mouth 3 (three) times daily. 02/28/18   [provider]  divalproex (DEPAKOTE) 500 MG DR tablet Take 500 mg by mouth 3 (three) times  daily.    [provider]  FLUoxetine (PROZAC) 40 MG capsule Take 40 mg by mouth daily. 02/28/18   [provider]  hydrochlorothiazide (HYDRODIURIL) 25 MG tablet Take 1 tablet (25 mg total) by mouth daily. 01/22/18   Minna AntisPaduchowski, Kevin, MD  pantoprazole (PROTONIX) 40 MG tablet Take 1 tablet (40 mg total) by mouth daily. 03/27/18 05/26/18  Houston SirenSainani, Vivek J, MD  sucralfate (CARAFATE) 1 g tablet Take 1 tablet (1 g total) by mouth 4 (four) times daily. 04/01/18   Sharman CheekStafford, Phillip, MD  Suvorexant (BELSOMRA) 20 MG TABS Take by mouth.    [provider]  tamsulosin (FLOMAX) 0.4 MG CAPS capsule Take 1 capsule (0.4 mg total) by mouth daily. 03/27/18 04/26/18  Houston SirenSainani, Vivek J, MD    Allergies Penicillins and Sulfa antibiotics  Family History  Problem Relation Age of Onset  . Kidney cancer Neg Hx   . Bladder Cancer Neg Hx   . Prostate cancer Neg Hx     Social History Social History   Tobacco Use  . Smoking status: Former Games developermoker  . Smokeless tobacco: Current User    Types: Snuff  Substance Use Topics  . Alcohol use: No  . Drug use: Never    Review of Systems Constitutional: No fever/chills Eyes: No visual changes. ENT: No sore throat. No stiff neck no neck pain Cardiovascular: Denies chest pain. Respiratory:  Denies shortness of breath. Gastrointestinal:  + vomiting.  No diarrhea.  No constipation. Genitourinary: Negative for dysuria. Musculoskeletal: Negative lower extremity swelling Skin: Negative for rash. Neurological: Negative for severe headaches, focal weakness or numbness.   ____________________________________________   PHYSICAL EXAM:  VITAL SIGNS: ED Triage Vitals  Enc Vitals Group     BP 04/17/18 1521 96/66     Pulse Rate 04/17/18 1521 83     Resp 04/17/18 1521 18     Temp 04/17/18 1521 98.7 F (37.1 C)     Temp Source 04/17/18 1521 Oral     SpO2 04/17/18 1521 98 %     Weight 04/17/18 1522 261 lb (118.4 kg)     Height 04/17/18 1522 5'  11" (1.803 m)     Head Circumference --      Peak Flow --      Pain Score 04/17/18 1521 0     Pain Loc --      Pain Edu? --      Excl. in GC? --     Constitutional: Alert and oriented. Well appearing and in no acute distress. Eyes: Conjunctivae are normal Head: Atraumatic HEENT: No congestion/rhinnorhea. Mucous membranes are moist.  Oropharynx non-erythematous Neck:   Nontender with no meningismus, no masses, no stridor Cardiovascular: Normal rate, regular rhythm. Grossly normal heart sounds.  Good peripheral circulation. Respiratory: Normal respiratory effort.  No retractions. Lungs CTAB. Abdominal: Soft and nontender. No distention. No guarding no rebound Back:  There is no focal tenderness or step off.  there is no midline tenderness there are no lesions noted. there is no CVA tenderness To exam, guaiac negative brown stool Musculoskeletal: No lower extremity tenderness, no upper extremity tenderness. No joint effusions, no DVT signs strong distal pulses no edema Neurologic:  Normal speech and language. No gross focal neurologic deficits are appreciated.  Skin:  Skin is warm, dry and intact. No rash noted. Psychiatric: Mood and affect are normal. Speech and behavior are normal.  ____________________________________________   LABS (all labs ordered are listed, but only abnormal results are displayed)  Labs Reviewed  COMPREHENSIVE METABOLIC PANEL - Abnormal; Notable for the following components:      Result Value   Potassium 3.0 (*)    All other components within normal limits  CBC - Abnormal; Notable for the following components:   WBC 12.6 (*)    All other components within normal limits  POC OCCULT BLOOD, ED  TYPE AND SCREEN    Pertinent labs  results that were available during my care of the patient were reviewed by me and considered in my medical decision making (see chart for details). ____________________________________________  EKG  I personally interpreted any  EKGs ordered by me or triage  ____________________________________________  RADIOLOGY  Pertinent labs & imaging results that were available during my care of the patient were reviewed by me and considered in my medical decision making (see chart for details). If possible, patient and/or family made aware of any abnormal findings.  No results found. ____________________________________________    PROCEDURES  Procedure(s) performed: None  Procedures  Critical Care performed: None  ____________________________________________   INITIAL IMPRESSION / ASSESSMENT AND PLAN / ED COURSE  Pertinent labs & imaging results that were available during my care of the patient were reviewed by me and considered in my medical decision making (see chart for details).  Patient felt overheated this morning and vomited, continue to work has had no difficulties ever since.  This happened proximately 10  hours ago.  He has no complaints at this time he was concerned that there was may be a thimble full blood in there.  Certainly no evidence of significant GI bleed.  Abdomen is benign.  He is guaiac-negative from below.  We will check a total CK to make sure he is not in rhabdomyolysis although low suspicion, will give him IV fluids, serial abdominal exams are negative here.  He states this is not an infrequent event for him.  Nonetheless we will give him fluids and reassess.  He states that as long as his hemoglobin is okay he is much preferring to go home he does have outpatient follow-up closely with GI he states    ____________________________________________   FINAL CLINICAL IMPRESSION(S) / ED DIAGNOSES  Final diagnoses:  None      This chart was dictated using voice recognition software.  Despite best efforts to proofread,  errors can occur which can change meaning.      Jeanmarie Plant, MD 04/17/18 1901

## 2018-04-17 NOTE — ED Triage Notes (Signed)
Patient reports 1 episode of vomiting blood and reports he is also feeling tired and lightheaded.  Patient has been having multiple episodes of black stools but can't get an appointment with GI until October 10th.

## 2018-04-19 ENCOUNTER — Emergency Department
Admission: EM | Admit: 2018-04-19 | Discharge: 2018-04-20 | Disposition: A | Discharge status: Home / Self Care | Payer: BLUE CROSS/BLUE SHIELD | Attending: Emergency Medicine | Admitting: Emergency Medicine

## 2018-04-19 ENCOUNTER — Other Ambulatory Visit: Payer: Self-pay

## 2018-04-19 DIAGNOSIS — I10 Essential (primary) hypertension: Secondary | ICD-10-CM | POA: Insufficient documentation

## 2018-04-19 DIAGNOSIS — Z79899 Other long term (current) drug therapy: Secondary | ICD-10-CM | POA: Diagnosis not present

## 2018-04-19 DIAGNOSIS — Z87891 Personal history of nicotine dependence: Secondary | ICD-10-CM | POA: Insufficient documentation

## 2018-04-19 DIAGNOSIS — R55 Syncope and collapse: Secondary | ICD-10-CM | POA: Diagnosis present

## 2018-04-19 DIAGNOSIS — E876 Hypokalemia: Secondary | ICD-10-CM | POA: Insufficient documentation

## 2018-04-19 LAB — CBC
HEMATOCRIT: 39.4 % — AB (ref 40.0–52.0)
Hemoglobin: 13.9 g/dL (ref 13.0–18.0)
MCH: 30 pg (ref 26.0–34.0)
MCHC: 35.2 g/dL (ref 32.0–36.0)
MCV: 85.3 fL (ref 80.0–100.0)
PLATELETS: 191 10*3/uL (ref 150–440)
RBC: 4.62 MIL/uL (ref 4.40–5.90)
RDW: 13.2 % (ref 11.5–14.5)
WBC: 9 10*3/uL (ref 3.8–10.6)

## 2018-04-19 LAB — COMPREHENSIVE METABOLIC PANEL
ALT: 21 U/L (ref 0–44)
ANION GAP: 8 (ref 5–15)
AST: 18 U/L (ref 15–41)
Albumin: 3.7 g/dL (ref 3.5–5.0)
Alkaline Phosphatase: 57 U/L (ref 38–126)
BILIRUBIN TOTAL: 1 mg/dL (ref 0.3–1.2)
BUN: 12 mg/dL (ref 6–20)
CALCIUM: 8.3 mg/dL — AB (ref 8.9–10.3)
CHLORIDE: 104 mmol/L (ref 98–111)
CO2: 27 mmol/L (ref 22–32)
Creatinine, Ser: 1.12 mg/dL (ref 0.61–1.24)
GFR calc Af Amer: >60 mL/min (ref >60)
Glucose, Bld: 105 mg/dL — ABNORMAL HIGH (ref 70–99)
POTASSIUM: 2.9 mmol/L — AB (ref 3.5–5.1)
Sodium: 139 mmol/L (ref 135–145)
Total Protein: 6.6 g/dL (ref 6.5–8.1)

## 2018-04-19 LAB — TROPONIN I: Troponin I: <0.03 ng/mL (ref <0.03)

## 2018-04-19 MED ORDER — POTASSIUM CHLORIDE CRYS ER 20 MEQ PO TBCR
20.0000 meq | EXTENDED_RELEASE_TABLET | Freq: Once | ORAL | Status: AC
  Administered 2018-04-19: 20 meq via ORAL
  Filled 2018-04-19: qty 1

## 2018-04-19 MED ORDER — POTASSIUM CHLORIDE ER 20 MEQ PO TBCR: 20.0000 meq | EXTENDED_RELEASE_TABLET | Freq: Two times a day (BID) | ORAL | 0 refills | Status: DC

## 2018-04-19 MED ORDER — SODIUM CHLORIDE 0.9 % IV BOLUS
1000.0000 mL | Freq: Once | INTRAVENOUS | Status: AC
  Administered 2018-04-19: 1000 mL via INTRAVENOUS

## 2018-04-19 MED ORDER — POTASSIUM CHLORIDE 10 MEQ/100ML IV SOLN
10.0000 meq | Freq: Once | INTRAVENOUS | Status: AC
  Administered 2018-04-19: 10 meq via INTRAVENOUS
  Filled 2018-04-19: qty 100

## 2018-04-19 NOTE — ED Provider Notes (Signed)
[facility] Emergency Department Provider Note ____________________________________________   First MD Initiated Contact with Patient 04/19/18 2020     (approximate)  I have reviewed the triage vital signs and the nursing notes.   HISTORY  Chief Complaint Loss of Consciousness    HPI Corey [name] is a 32 y.o. male with PMH as noted below who presents with syncope today, acute onset this evening while the patient was in his home and was standing, and preceded by lightheadedness and feeling like he was about to pass out.  The patient states he hit his head but came to soon afterwards.  He reports he has had nasal congestion and URI symptoms over the last several days.  He was also recently seen for possible GI bleeding with spitting up blood and dark stool, but he states that these symptoms have not occurred in the last few days.  Past Medical History:  Diagnosis Date  . Bipolar 1 disorder (HCC)   . HTN (hypertension)     Patient Active Problem List   Diagnosis Date Noted  . Kidney stone 03/26/2018    Past Surgical History:  Procedure Laterality Date  . KNEE ARTHROSCOPY      Prior to Admission medications   Medication Sig Start Date End Date Taking? Authorizing Provider  clonazePAM (KLONOPIN) 1 MG tablet Take 1 mg by mouth 2 (two) times daily as needed for anxiety. 02/28/18   [provider]  cloNIDine (CATAPRES) 0.1 MG tablet Take 0.1 mg by mouth 3 (three) times daily. 02/28/18   [provider]  divalproex (DEPAKOTE) 500 MG DR tablet Take 500 mg by mouth 3 (three) times daily.    [provider]  FLUoxetine (PROZAC) 40 MG capsule Take 40 mg by mouth daily. 02/28/18   [provider]  hydrochlorothiazide (HYDRODIURIL) 25 MG tablet Take 1 tablet (25 mg total) by mouth daily. 01/22/18   Minna Antis, MD  pantoprazole (PROTONIX) 40 MG tablet Take 1 tablet (40 mg total) by mouth daily. 03/27/18 05/26/18  Houston Siren, MD  potassium chloride 20 MEQ TBCR Take 20 mEq by mouth 2 (two) times daily for 7 days. 04/19/18 04/26/18  Dionne Bucy, MD  sucralfate (CARAFATE) 1 g tablet Take 1 tablet (1 g total) by mouth 4 (four) times daily. 04/01/18   Sharman Cheek, MD  Suvorexant (BELSOMRA) 20 MG TABS Take by mouth.    [provider]  tamsulosin (FLOMAX) 0.4 MG CAPS capsule Take 1 capsule (0.4 mg total) by mouth daily. 03/27/18 04/26/18  Houston Siren, MD    Allergies Penicillins and Sulfa antibiotics  Family History  Problem Relation Age of Onset  . Kidney cancer Neg Hx   . Bladder Cancer Neg Hx   . Prostate cancer Neg Hx     Social History Social History   Tobacco Use  . Smoking status: Former Games developer  . Smokeless tobacco: Current User    Types: Snuff  Substance Use Topics  . Alcohol use: No  . Drug use: Never    Review of Systems  Constitutional: No fever. Eyes: No redness. ENT: Positive for nasal congestion. Cardiovascular: Denies chest pain. Respiratory: Denies shortness of breath. Gastrointestinal: No vomiting.  Genitourinary: Negative for dysuria or hematuria.  Musculoskeletal: Negative for back pain. Skin: Negative for rash. Neurological: Negative for headache.   ____________________________________________   PHYSICAL EXAM:  VITAL SIGNS: ED Triage Vitals [04/19/18 1949]  Enc Vitals Group     BP (!) 104/59  Pulse Rate 63     Resp 20     Temp 99.1 F (37.3 C)     Temp Source Oral     SpO2 95 %     Weight 260 lb (117.9 kg)     Height 5\' 11"  (1.803 m)     Head Circumference      Peak Flow      Pain Score 0     Pain Loc      Pain Edu?      Excl. in GC?     Constitutional: Alert and oriented. Well appearing and in no acute distress. Eyes: Conjunctivae are normal.  EOMI.  PERRLA. Head: Atraumatic. Nose: No congestion/rhinnorhea. Mouth/Throat: Mucous membranes are somewhat dry.   Neck: Normal range of motion.  Cardiovascular: Normal rate,  regular rhythm. Grossly normal heart sounds.  Good peripheral circulation. Respiratory: Normal respiratory effort.  No retractions. Lungs CTAB. Gastrointestinal: Soft and nontender. No distention.  Genitourinary: No flank tenderness. Musculoskeletal: Extremities warm and well perfused.  Neurologic:  Normal speech and language.  Motor intact in all extremities.  Normal coordination.  No gross focal neurologic deficits are appreciated.  Skin:  Skin is warm and dry. No rash noted. Psychiatric: Mood and affect are normal. Speech and behavior are normal.  ____________________________________________   LABS (all labs ordered are listed, but only abnormal results are displayed)  Labs Reviewed  CBC - Abnormal; Notable for the following components:      Result Value   HCT 39.4 (*)    All other components within normal limits  COMPREHENSIVE METABOLIC PANEL - Abnormal; Notable for the following components:   Potassium 2.9 (*)    Glucose, Bld 105 (*)    Calcium 8.3 (*)    All other components within normal limits  TROPONIN I   ____________________________________________  EKG  ED ECG REPORT I, Dionne Bucy, the attending physician, personally viewed and interpreted this ECG.  Date: 04/19/2018 EKG Time: 1954 Rate: 72 Rhythm: normal sinus rhythm QRS Axis: normal Intervals: normal ST/T Wave abnormalities: normal Narrative Interpretation: no evidence of acute ischemia  ____________________________________________  RADIOLOGY    ____________________________________________   PROCEDURES  Procedure(s) performed: No  Procedures  Critical Care performed: No ____________________________________________   INITIAL IMPRESSION / ASSESSMENT AND PLAN / ED COURSE  Pertinent labs & imaging results that were available during my care of the patient were reviewed by me and considered in my medical decision making (see chart for details).  32 year old male with PMH as noted above  presents with an episode of syncope with vasovagal type prodrome.  He reports URI symptoms in the last few days.  I reviewed the past medical records in epic; the patient has had several ED visits and had an admission in the last month.  He was initially seen 1 month ago with dark stool and was started on sucralfate and an antacid.  He was then seen a few days later with a kidney stone and coffee-ground emesis and was admitted.  He returned at the end of last month with dark stools but stable labs, and was most recently seen in the ED 2 days ago with a small episode of vomiting with possible trace blood in it.  He reports today that since that last visit, he has had no further vomiting, and no blood in the stool or dark stools.  On exam, the patient is well-appearing and his vital signs are normal except for low-grade temperature.  He otherwise has an unremarkable exam.  EKG is normal.  Overall I suspect most likely vasovagal syncope.  The patient appears to have a URI and this would likely predispose him.  I have a low suspicion for syncope related to GI bleeding or acute blood loss as the patient has no current symptoms of GI bleed.  We will obtain labs, give fluids, and reassess.  ----------------------------------------- 9:37 PM on 04/19/2018 -----------------------------------------  Lab work-up reveals normal hemoglobin.  This confirms that the patient's syncope today is unrelated to his recent GI bleeding.  His potassium is somewhat low, so I will replete it.  ----------------------------------------- 11:13 PM on 04/19/2018 -----------------------------------------  Patient is awaiting the IV potassium dose.  He has remained stable in the ED.  He is stable for discharge home once he receives the potassium.  Return precautions given, and he expresses understanding. ____________________________________________   FINAL CLINICAL IMPRESSION(S) / ED DIAGNOSES  Final diagnoses:    Hypokalemia  Vasovagal syncope      NEW MEDICATIONS STARTED DURING THIS VISIT:  New Prescriptions   POTASSIUM CHLORIDE 20 MEQ TBCR    Take 20 mEq by mouth 2 (two) times daily for 7 days.     Note:  This document was prepared using Dragon voice recognition software and may include unintentional dictation errors.    Dionne BucySiadecki, Hadja Harral, MD 04/19/18 [phone]

## 2018-04-19 NOTE — ED Triage Notes (Signed)
Pt brought in by [facility] states was at home and had syncopal episode. STates has been here several times for gi bleed. States has not had dark or bloody stools recently.

## 2018-04-19 NOTE — ED Notes (Signed)
Pt states that he fell unconscious and has been lethargic since he had the syncopal episode. Pt states that he doesn't have any pain but he has just been feeling "bad."

## 2018-04-19 NOTE — ED Notes (Signed)
No CT to be done per Dr. Marisa SeverinSiadecki.

## 2018-04-19 NOTE — Discharge Instructions (Signed)
Take the potassium as prescribed and finish the full course.  Follow-up with the GI doctor as planned and with your regular doctor.  Your potassium level should be rechecked in 1 to 2 weeks to make sure it is back to normal.  Return to the ER for new, worsening, or persistent weakness, lightheadedness, passing out, palpitations, difficulty breathing, blood in the stool or any vomiting blood, or any other new or worsening symptoms that concern you.

## 2018-04-24 ENCOUNTER — Inpatient Hospital Stay
Admit: 2018-04-24 | Discharge: 2018-04-24 | Disposition: A | Discharge status: Home / Self Care | Payer: BLUE CROSS/BLUE SHIELD | Attending: Physician Assistant | Admitting: Physician Assistant

## 2018-04-24 ENCOUNTER — Other Ambulatory Visit: Payer: Self-pay

## 2018-04-24 ENCOUNTER — Inpatient Hospital Stay
Admission: EM | Admit: 2018-04-24 | Discharge: 2018-04-26 | DRG: 312 | Disposition: A | Discharge status: Home / Self Care | Payer: BLUE CROSS/BLUE SHIELD | Attending: Internal Medicine | Admitting: Internal Medicine

## 2018-04-24 DIAGNOSIS — R001 Bradycardia, unspecified: Secondary | ICD-10-CM | POA: Diagnosis present

## 2018-04-24 DIAGNOSIS — K226 Gastro-esophageal laceration-hemorrhage syndrome: Secondary | ICD-10-CM | POA: Diagnosis present

## 2018-04-24 DIAGNOSIS — I10 Essential (primary) hypertension: Secondary | ICD-10-CM | POA: Diagnosis present

## 2018-04-24 DIAGNOSIS — F1210 Cannabis abuse, uncomplicated: Secondary | ICD-10-CM | POA: Diagnosis present

## 2018-04-24 DIAGNOSIS — K920 Hematemesis: Secondary | ICD-10-CM | POA: Diagnosis present

## 2018-04-24 DIAGNOSIS — E860 Dehydration: Secondary | ICD-10-CM | POA: Diagnosis present

## 2018-04-24 DIAGNOSIS — E876 Hypokalemia: Secondary | ICD-10-CM | POA: Diagnosis present

## 2018-04-24 DIAGNOSIS — Z720 Tobacco use: Secondary | ICD-10-CM

## 2018-04-24 DIAGNOSIS — E669 Obesity, unspecified: Secondary | ICD-10-CM | POA: Diagnosis present

## 2018-04-24 DIAGNOSIS — R55 Syncope and collapse: Principal | ICD-10-CM | POA: Diagnosis present

## 2018-04-24 DIAGNOSIS — Z6836 Body mass index (BMI) 36.0-36.9, adult: Secondary | ICD-10-CM | POA: Diagnosis not present

## 2018-04-24 DIAGNOSIS — F319 Bipolar disorder, unspecified: Secondary | ICD-10-CM | POA: Diagnosis present

## 2018-04-24 DIAGNOSIS — N179 Acute kidney failure, unspecified: Secondary | ICD-10-CM | POA: Diagnosis present

## 2018-04-24 DIAGNOSIS — Z882 Allergy status to sulfonamides status: Secondary | ICD-10-CM

## 2018-04-24 DIAGNOSIS — Z23 Encounter for immunization: Secondary | ICD-10-CM

## 2018-04-24 DIAGNOSIS — Z79899 Other long term (current) drug therapy: Secondary | ICD-10-CM | POA: Diagnosis not present

## 2018-04-24 DIAGNOSIS — Z880 Allergy status to penicillin: Secondary | ICD-10-CM

## 2018-04-24 DIAGNOSIS — N400 Enlarged prostate without lower urinary tract symptoms: Secondary | ICD-10-CM | POA: Diagnosis present

## 2018-04-24 LAB — COMPREHENSIVE METABOLIC PANEL
ALBUMIN: 3.6 g/dL (ref 3.5–5.0)
ALK PHOS: 65 U/L (ref 38–126)
ALT: 25 U/L (ref 0–44)
AST: 21 U/L (ref 15–41)
Anion gap: 7 (ref 5–15)
BILIRUBIN TOTAL: 0.4 mg/dL (ref 0.3–1.2)
BUN: 11 mg/dL (ref 6–20)
CALCIUM: 8.2 mg/dL — AB (ref 8.9–10.3)
CO2: 29 mmol/L (ref 22–32)
CREATININE: 1.34 mg/dL — AB (ref 0.61–1.24)
Chloride: 104 mmol/L (ref 98–111)
GFR calc Af Amer: >60 mL/min (ref >60)
GFR calc non Af Amer: >60 mL/min (ref >60)
Glucose, Bld: 105 mg/dL — ABNORMAL HIGH (ref 70–99)
Potassium: 3.2 mmol/L — ABNORMAL LOW (ref 3.5–5.1)
Sodium: 140 mmol/L (ref 135–145)
TOTAL PROTEIN: 6.7 g/dL (ref 6.5–8.1)

## 2018-04-24 LAB — CBC
HEMATOCRIT: 42.3 % (ref 40.0–52.0)
HEMOGLOBIN: 14.7 g/dL (ref 13.0–18.0)
MCH: 29.9 pg (ref 26.0–34.0)
MCHC: 34.7 g/dL (ref 32.0–36.0)
MCV: 86.2 fL (ref 80.0–100.0)
Platelets: 260 10*3/uL (ref 150–440)
RBC: 4.91 MIL/uL (ref 4.40–5.90)
RDW: 13.4 % (ref 11.5–14.5)
WBC: 8.4 10*3/uL (ref 3.8–10.6)

## 2018-04-24 LAB — TROPONIN I: Troponin I: <0.03 ng/mL (ref <0.03)

## 2018-04-24 LAB — URINE DRUG SCREEN, QUALITATIVE (ARMC ONLY)
Amphetamines, Ur Screen: NOT DETECTED
Barbiturates, Ur Screen: NOT DETECTED
Benzodiazepine, Ur Scrn: POSITIVE — AB
Cannabinoid 50 Ng, Ur ~~LOC~~: POSITIVE — AB
Cocaine Metabolite,Ur ~~LOC~~: NOT DETECTED
MDMA (Ecstasy)Ur Screen: NOT DETECTED
Methadone Scn, Ur: NOT DETECTED
Opiate, Ur Screen: NOT DETECTED
Phencyclidine (PCP) Ur S: NOT DETECTED
Tricyclic, Ur Screen: NOT DETECTED

## 2018-04-24 LAB — MAGNESIUM: Magnesium: 2.5 mg/dL — ABNORMAL HIGH (ref 1.7–2.4)

## 2018-04-24 LAB — TSH: TSH: 1.104 u[IU]/mL (ref 0.350–4.500)

## 2018-04-24 MED ORDER — DIVALPROEX SODIUM ER 500 MG PO TB24
500.0000 mg | ORAL_TABLET | Freq: Every day | ORAL | Status: DC
  Administered 2018-04-24 – 2018-04-26 (×3): 500 mg via ORAL
  Filled 2018-04-24 (×3): qty 1

## 2018-04-24 MED ORDER — SODIUM CHLORIDE 0.9 % IV SOLN
INTRAVENOUS | Status: DC
  Administered 2018-04-24 (×2): via INTRAVENOUS

## 2018-04-24 MED ORDER — ACETAMINOPHEN 650 MG RE SUPP: 650.0000 mg | Freq: Four times a day (QID) | RECTAL | Status: DC | PRN

## 2018-04-24 MED ORDER — ONDANSETRON HCL 4 MG/2ML IJ SOLN: 4.0000 mg | Freq: Four times a day (QID) | INTRAMUSCULAR | Status: DC | PRN

## 2018-04-24 MED ORDER — INFLUENZA VAC SPLIT QUAD 0.5 ML IM SUSY
0.5000 mL | PREFILLED_SYRINGE | INTRAMUSCULAR | Status: AC
  Administered 2018-04-26: 0.5 mL via INTRAMUSCULAR
  Filled 2018-04-24: qty 0.5

## 2018-04-24 MED ORDER — ONDANSETRON HCL 4 MG PO TABS: 4.0000 mg | ORAL_TABLET | Freq: Four times a day (QID) | ORAL | Status: DC | PRN

## 2018-04-24 MED ORDER — POTASSIUM CHLORIDE CRYS ER 20 MEQ PO TBCR
40.0000 meq | EXTENDED_RELEASE_TABLET | ORAL | Status: AC
  Administered 2018-04-24: 40 meq via ORAL
  Filled 2018-04-24: qty 2

## 2018-04-24 MED ORDER — SUCRALFATE 1 G PO TABS
1.0000 g | ORAL_TABLET | Freq: Four times a day (QID) | ORAL | Status: DC
  Administered 2018-04-24 – 2018-04-26 (×9): 1 g via ORAL
  Filled 2018-04-24 (×9): qty 1

## 2018-04-24 MED ORDER — ACETAMINOPHEN 325 MG PO TABS: 650.0000 mg | ORAL_TABLET | Freq: Four times a day (QID) | ORAL | Status: DC | PRN

## 2018-04-24 MED ORDER — ATROPINE SULFATE 1 MG/10ML IJ SOSY
0.4000 mg | PREFILLED_SYRINGE | Freq: Once | INTRAMUSCULAR | Status: AC
  Administered 2018-04-24: 0.4 mg via INTRAVENOUS
  Filled 2018-04-24: qty 10

## 2018-04-24 MED ORDER — HYDROCHLOROTHIAZIDE 25 MG PO TABS
25.0000 mg | ORAL_TABLET | Freq: Every day | ORAL | Status: DC
  Administered 2018-04-24 – 2018-04-26 (×2): 25 mg via ORAL
  Filled 2018-04-24 (×3): qty 1

## 2018-04-24 MED ORDER — PANTOPRAZOLE SODIUM 40 MG PO TBEC
40.0000 mg | DELAYED_RELEASE_TABLET | Freq: Every day | ORAL | Status: DC
  Administered 2018-04-24 – 2018-04-26 (×3): 40 mg via ORAL
  Filled 2018-04-24 (×3): qty 1

## 2018-04-24 MED ORDER — DOCUSATE SODIUM 100 MG PO CAPS
100.0000 mg | ORAL_CAPSULE | Freq: Two times a day (BID) | ORAL | Status: DC
  Administered 2018-04-24 – 2018-04-25 (×2): 100 mg via ORAL
  Filled 2018-04-24 (×4): qty 1

## 2018-04-24 MED ORDER — ENOXAPARIN SODIUM 40 MG/0.4ML ~~LOC~~ SOLN
40.0000 mg | SUBCUTANEOUS | Status: DC
  Administered 2018-04-24 – 2018-04-25 (×2): 40 mg via SUBCUTANEOUS
  Filled 2018-04-24 (×2): qty 0.4

## 2018-04-24 MED ORDER — POTASSIUM CHLORIDE CRYS ER 20 MEQ PO TBCR
20.0000 meq | EXTENDED_RELEASE_TABLET | Freq: Two times a day (BID) | ORAL | Status: DC
  Administered 2018-04-24 – 2018-04-25 (×4): 20 meq via ORAL
  Filled 2018-04-24 (×4): qty 1

## 2018-04-24 MED ORDER — FLUOXETINE HCL 20 MG PO CAPS
40.0000 mg | ORAL_CAPSULE | Freq: Every day | ORAL | Status: DC
  Administered 2018-04-24 – 2018-04-26 (×3): 40 mg via ORAL
  Filled 2018-04-24 (×3): qty 2

## 2018-04-24 MED ORDER — TAMSULOSIN HCL 0.4 MG PO CAPS
0.4000 mg | ORAL_CAPSULE | Freq: Every day | ORAL | Status: DC
  Administered 2018-04-24 – 2018-04-26 (×3): 0.4 mg via ORAL
  Filled 2018-04-24 (×3): qty 1

## 2018-04-24 MED ORDER — OXYCODONE-ACETAMINOPHEN 5-325 MG PO TABS
1.0000 | ORAL_TABLET | Freq: Four times a day (QID) | ORAL | Status: DC | PRN
  Administered 2018-04-24: 1 via ORAL
  Filled 2018-04-24: qty 1

## 2018-04-24 MED ORDER — SODIUM CHLORIDE 0.9 % IV BOLUS
1000.0000 mL | Freq: Once | INTRAVENOUS | Status: AC
  Administered 2018-04-24: 1000 mL via INTRAVENOUS

## 2018-04-24 MED ORDER — ENSURE ENLIVE PO LIQD
237.0000 mL | Freq: Three times a day (TID) | ORAL | Status: DC
  Administered 2018-04-24 – 2018-04-26 (×6): 237 mL via ORAL

## 2018-04-24 NOTE — Progress Notes (Signed)
Initial Nutrition Assessment  DOCUMENTATION CODES:   Not applicable  INTERVENTION:  Ensure Enlive TID to provide 350 kcal 20g protein   NUTRITION DIAGNOSIS:   Inadequate oral intake related to decreased appetite, acute illness as evidenced by per patient/family report, energy intake < 75% for > or equal to 1 month.   GOAL:   Patient will meet greater than or equal to 90% of their needs   MONITOR:   PO intake, Supplement acceptance  REASON FOR ASSESSMENT:   Consult Assessment of nutrition requirement/status  ASSESSMENT:   Pt admitted after synocopal episode and bradycardia. MD note reports pt had consumed double of prescribed clonazePam. PMH: GI bleed 73month ago, gallstones, Bipolar, HTN   Pt had lunch at bedside at time of visit and had consumed 10% of grilled chicken breast and mashed pots. Pt reports decreased appetite x2 weeks, but can drink and stated that he is consistently thirsty d/t medications he takes.  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 Pt reports drinking a lot of energy drinks and caffinated beverages on days he has to work early shifts.   Pt reports feeling dizzy while out with his girlfriend and their children last weekend and had to remain in the car while they shopped. Pt reports having vomiting a small amount of blood last Monday is following up with outpt GI on 10/3 for a previous bleed last month.   Pt reports UBW of 287lbs approximately 91mo. ago  Medications Reviewed and Include: docusate sodium, prozac, protonix, potassium chloride  Labs reviewed and Include: K 3.2 (L) being replaced, Glucose 105 (H), Ca 8.2 (L) Albumin 3.6 (WNL),   NUTRITION - FOCUSED PHYSICAL EXAM:    Most Recent Value  Orbital Region  Mild depletion  Upper Arm Region  No depletion  Thoracic and Lumbar Region  No depletion  Buccal Region  No depletion  Temple Region  Mild depletion  Clavicle Bone Region  No depletion  Clavicle and  Acromion Bone Region  No depletion  Scapular Bone Region  No depletion  Dorsal Hand  No depletion  Patellar Region  No depletion  Anterior Thigh Region  No depletion  Posterior Calf Region  No depletion  Edema (RD Assessment)  None  Hair  Reviewed  Eyes  Reviewed  Mouth  Reviewed  Skin  Reviewed  Nails  Reviewed       Diet Order:  10% of lunch consumed at visit Diet Order            Diet Heart Room service appropriate? Yes; Fluid consistency: Thin  Diet effective now              EDUCATION NEEDS:   Education needs have been addressed  Skin:  Skin Assessment: Reviewed RN Assessment  Last BM:  Reviewed RN note; non recorded  Height:   Ht Readings from Last 1 Encounters:  04/24/18 5' 10.98" (1.803 m)    Weight:   Wt Readings from Last 1 Encounters:  04/24/18 119.8 kg    Ideal Body Weight:  78.2 kg  BMI:  Body mass index is 36.86 kg/m.  Estimated Nutritional Needs:   Kcal:  2400-2800 (30-35kcal/kg)(Needs based on IBW 78.2kg)  Protein:  90-110grams (1.2-1.4)  Fluid:  2.8L    Lars MassonSuzanne Cire Clute, RD, LDN  After Hours/Weekend Pager: [phone]

## 2018-04-24 NOTE — Progress Notes (Signed)
  This is a 32 year old male admitted for symptomatic bradycardia. Patient has no complaints.   Vital signs reviewed, stable bradycardia with heart rate at 30-40. Physical examination done. 1.  Symptomatically persistent bradycardia: Given atropine in the emergency department.  No pathologic rhythms noted on EKG.  The patient takes multiple medications that can lower heart rate, suspected polypharmacy, so the patient's clonidine, Belsomra and Klonopin on hold.  Echocardiograph per cardiology consult. 2.  AKI: Hydrate with vitreous fluid.  Avoid nephrotoxic agents. 3.  Hypokalemia: Repleted potassium 4.  Obesity: BMI 36.2; encouraged healthy diet and exercise. 5. Hematemesis at home.  PPI and EGD after cardiac clearance per Dr. Tobi BastosAnna.  Discussed with patient and RN. Time spent about 26 minutes.

## 2018-04-24 NOTE — H&P (Addendum)
Corey [name] is an 32 y.o. male.   Chief Complaint: Loss of consciousness HPI: The patient with past medical history of hypertension presents emergency department complaining of fainting.  The patient denies chest pain, as of breath, dizziness or hitting his head.  Work-up in the emergency department was mostly unremarkable except for persistent bradycardia.  Due to the patient's syncopal episode as well as persistently low heart rate the emergency department staff called the hospitalist service for further evaluation.  Past Medical History:  Diagnosis Date  . Bipolar 1 disorder ([place])   . HTN (hypertension)     Past Surgical History:  Procedure Laterality Date  . KNEE ARTHROSCOPY      Family History  Problem Relation Age of Onset  . Kidney cancer Neg Hx   . Bladder Cancer Neg Hx   . Prostate cancer Neg Hx    Social History:  reports that he has quit smoking. His smokeless tobacco use includes snuff. He reports that he does not drink alcohol or use drugs.  Allergies:  Allergies  Allergen Reactions  . Penicillins Hives    Has patient had a PCN reaction causing immediate rash, facial/tongue/throat swelling, SOB or lightheadedness with hypotension: Yes Has patient had a PCN reaction causing severe rash involving mucus membranes or skin necrosis: No Has patient had a PCN reaction that required hospitalization: No Has patient had a PCN reaction occurring within the last 10 years: No If all of the above answers are "NO", then may proceed with Cephalosporin use.   . Sulfa Antibiotics Hives    Prior to Admission medications   Medication Sig Start Date End Date Taking? Authorizing Provider  clonazePAM (KLONOPIN) 1 MG tablet Take 1 mg by mouth 2 (two) times daily as needed for anxiety. 02/28/18  Yes [provider]  cloNIDine (CATAPRES) 0.1 MG tablet Take 0.1 mg by mouth 3 (three) times daily. 02/28/18  Yes [provider]  divalproex (DEPAKOTE ER) 500 MG 24 hr tablet  Take 500 mg by mouth daily. 04/24/18  Yes [provider]  FLUoxetine (PROZAC) 40 MG capsule Take 40 mg by mouth daily. 02/28/18  Yes [provider]  hydrochlorothiazide (HYDRODIURIL) 25 MG tablet Take 1 tablet (25 mg total) by mouth daily. 01/22/18  Yes Harvest Dark, MD  pantoprazole (PROTONIX) 40 MG tablet Take 1 tablet (40 mg total) by mouth daily. 03/27/18 05/26/18 Yes Sainani, Belia Heman, MD  potassium chloride 20 MEQ TBCR Take 20 mEq by mouth 2 (two) times daily for 7 days. 04/19/18 04/26/18 Yes Arta Silence, MD  sucralfate (CARAFATE) 1 g tablet Take 1 tablet (1 g total) by mouth 4 (four) times daily. 04/01/18  Yes Carrie Mew, MD  Suvorexant (BELSOMRA) 20 MG TABS Take 20 mg by mouth at bedtime as needed (sleep).    Yes [provider]  tamsulosin (FLOMAX) 0.4 MG CAPS capsule Take 1 capsule (0.4 mg total) by mouth daily. 03/27/18 04/26/18 Yes Henreitta Leber, MD     Results for orders placed or performed during the hospital encounter of 04/24/18 (from the past 48 hour(s))  CBC     Status: None   Collection Time: 04/24/18  2:41 AM  Result Value Ref Range   WBC 8.4 3.8 - 10.6 K/uL   RBC 4.91 4.40 - 5.90 MIL/uL   Hemoglobin 14.7 13.0 - 18.0 g/dL   HCT 42.3 40.0 - 52.0 %   MCV 86.2 80.0 - 100.0 fL   MCH 29.9 26.0 - 34.0 pg  MCHC 34.7 32.0 - 36.0 g/dL   RDW 13.4 11.5 - 14.5 %   Platelets 260 150 - 440 K/uL    Comment: Performed at [facility], [address] [place], [place] [address]  Comprehensive metabolic panel     Status: Abnormal   Collection Time: 04/24/18  2:41 AM  Result Value Ref Range   Sodium 140 135 - 145 mmol/L   Potassium 3.2 (L) 3.5 - 5.1 mmol/L   Chloride 104 98 - 111 mmol/L   CO2 29 22 - 32 mmol/L   Glucose, Bld 105 (H) 70 - 99 mg/dL   BUN 11 6 - 20 mg/dL   Creatinine, Ser 1.34 (H) 0.61 - 1.24 mg/dL   Calcium 8.2 (L) 8.9 - 10.3 mg/dL   Total Protein 6.7 6.5 - 8.1 g/dL   Albumin 3.6 3.5 - 5.0 g/dL   AST 21 15 -  41 U/L   ALT 25 0 - 44 U/L   Alkaline Phosphatase 65 38 - 126 U/L   Total Bilirubin 0.4 0.3 - 1.2 mg/dL   GFR calc non Af Amer >60 >60 mL/min   GFR calc Af Amer >60 >60 mL/min    Comment: (NOTE) The eGFR has been calculated using the CKD EPI equation. This calculation has not been validated in all clinical situations. eGFR's persistently <60 mL/min signify possible Chronic Kidney Disease.    Anion gap 7 5 - 15    Comment: Performed at [facility], [address] [place], [place] [address]  Troponin I     Status: None   Collection Time: 04/24/18  2:41 AM  Result Value Ref Range   Troponin I <0.03 <0.03 ng/mL    Comment: Performed at [facility], [address] [place], [place] [address]   No results found.  Review of Systems  Unable to perform ROS: Mental status change    Blood pressure 132/82, pulse (!) 36, temperature 98 F (36.7 C), temperature source Oral, resp. rate 16, height '5\' 11"'  (1.803 m), weight 117.9 kg, SpO2 98 %. Physical Exam  Vitals reviewed. Constitutional: He is oriented to person, place, and time. He appears well-developed and well-nourished. No distress.  HENT:  Head: Normocephalic and atraumatic.  Mouth/Throat: Oropharynx is clear and moist.  Eyes: Pupils are equal, round, and reactive to light. Conjunctivae and EOM are normal. No scleral icterus.  Neck: Normal range of motion. Neck supple. No JVD present. No tracheal deviation present. No thyromegaly present.  Cardiovascular: Normal rate and regular rhythm. Exam reveals no gallop and no friction rub.  No murmur heard. Respiratory: Effort normal and breath sounds normal. No respiratory distress.  GI: Soft. Bowel sounds are normal. He exhibits no distension. There is no tenderness.  Genitourinary:  Genitourinary Comments: Deferred  Musculoskeletal: Normal range of motion. He exhibits no edema.  Lymphadenopathy:    He has no cervical adenopathy.  Neurological: He is oriented to  person, place, and time. No cranial nerve deficit.  Very somnolent  Skin: Skin is warm and dry. No rash noted. No erythema.  Psychiatric: He has a normal mood and affect. His behavior is normal. Judgment and thought content normal.     Assessment/Plan This is a 32 year old male admitted for symptomatic bradycardia. 1.  Symptomatically bradycardia: Given atropine in the emergency department.  No pathologic rhythms noted on EKG.  The patient takes multiple medications that can lower heart rate.  I suspect polypharmacy may be the etiology of his cardia.  I have held  the patient's clonidine, Belsomra and Klonopin.  Monitor telemetry.  Consult cardiology. 2.  AKI: Hydrate with vitreous fluid.  Avoid nephrotoxic agents. 3.  Hypokalemia: Replete potassium 4.  Obesity: BMI 36.2; encouraged healthy diet and exercise 5.  DVT prophylaxis: Lovenox 6.  GI prophylaxis: None The patient is a full code.  Time spent on admission orders and patient care approximately 45 minutes  Harrie Foreman, MD 04/24/2018, 6:17 AM

## 2018-04-24 NOTE — ED Provider Notes (Addendum)
[facility] Emergency Department Provider Note   First MD Initiated Contact with Patient 04/24/18 0303     (approximate)  I have reviewed the triage vital signs and the nursing notes.   HISTORY  Chief Complaint Loss of Consciousness    HPI Corey [name] is a 32 y.o. male below list of chronic medical conditions presents to the emergency department via EMS following Near-syncopal episode prior to arrival.  Patient states that he felt really "lightheaded and dizzy and caught himself before he actually hit the floor.  Patient denies any chest pain no shortness of breath.  Patient denies any nausea or vomiting.  Patient does admit to black stools.  Patient denies any fever.  Patient denies any headache or visual changes.  Past Medical History:  Diagnosis Date  . Bipolar 1 disorder ([facility])   . HTN (hypertension)     Patient Active Problem List   Diagnosis Date Noted  . Kidney stone 03/26/2018    Past Surgical History:  Procedure Laterality Date  . KNEE ARTHROSCOPY      Prior to Admission medications   Medication Sig Start Date End Date Taking? Authorizing Provider  clonazePAM (KLONOPIN) 1 MG tablet Take 1 mg by mouth 2 (two) times daily as needed for anxiety. 02/28/18  Yes [provider]  cloNIDine (CATAPRES) 0.1 MG tablet Take 0.1 mg by mouth 3 (three) times daily. 02/28/18  Yes [provider]  divalproex (DEPAKOTE ER) 500 MG 24 hr tablet Take 500 mg by mouth daily. 04/24/18  Yes [provider]  FLUoxetine (PROZAC) 40 MG capsule Take 40 mg by mouth daily. 02/28/18  Yes [provider]  hydrochlorothiazide (HYDRODIURIL) 25 MG tablet Take 1 tablet (25 mg total) by mouth daily. 01/22/18  Yes Minna AntisPaduchowski, Kevin, MD  pantoprazole (PROTONIX) 40 MG tablet Take 1 tablet (40 mg total) by mouth daily. 03/27/18 05/26/18 Yes Sainani, Rolly PancakeVivek J, MD  potassium chloride 20 MEQ TBCR Take 20 mEq by mouth 2 (two) times daily for 7 days.  04/19/18 04/26/18 Yes Dionne BucySiadecki, Sebastian, MD  sucralfate (CARAFATE) 1 g tablet Take 1 tablet (1 g total) by mouth 4 (four) times daily. 04/01/18  Yes Sharman CheekStafford, Phillip, MD  Suvorexant (BELSOMRA) 20 MG TABS Take 20 mg by mouth at bedtime as needed (sleep).    Yes [provider]  tamsulosin (FLOMAX) 0.4 MG CAPS capsule Take 1 capsule (0.4 mg total) by mouth daily. 03/27/18 04/26/18 Yes Houston SirenSainani, Vivek J, MD    Allergies Penicillins and Sulfa antibiotics  Family History  Problem Relation Age of Onset  . Kidney cancer Neg Hx   . Bladder Cancer Neg Hx   . Prostate cancer Neg Hx     Social History Social History   Tobacco Use  . Smoking status: Former Games developermoker  . Smokeless tobacco: Current User    Types: Snuff  Substance Use Topics  . Alcohol use: No  . Drug use: Never    Review of Systems Constitutional: No fever/chills Eyes: No visual changes. ENT: No sore throat. Cardiovascular: Denies chest pain. Respiratory: Denies shortness of breath. Gastrointestinal: No abdominal pain.  No nausea, no vomiting.  No diarrhea.  No constipation. Genitourinary: Negative for dysuria. Musculoskeletal: Negative for neck pain.  Negative for back pain. Integumentary: Negative for rash. Neurological: Negative for headaches, focal weakness or numbness.  Positive for lightheadedness and dizziness.   ____________________________________________   PHYSICAL EXAM:  VITAL SIGNS: ED Triage Vitals  Enc Vitals Group     BP  04/24/18 0239 109/65     Pulse Rate 04/24/18 0239 (!) 43     Resp 04/24/18 0239 17     Temp 04/24/18 0239 98 F (36.7 C)     Temp Source 04/24/18 0239 Oral     SpO2 04/24/18 0237 96 %     Weight 04/24/18 0244 117.9 kg (260 lb)     Height 04/24/18 0244 1.803 m (5\' 11" )     Head Circumference --      Peak Flow --      Pain Score 04/24/18 0244 0     Pain Loc --      Pain Edu? --      Excl. in GC? --     Constitutional: Alert and oriented.  Appears somnolent  eyes:  Conjunctivae are normal. PERRL. EOMI. Head: Atraumatic. Mouth/Throat: Mucous membranes are moist.  Oropharynx non-erythematous. Neck: No stridor.   Cardiovascular: Bradycardia, regular rhythm. Good peripheral circulation. Grossly normal heart sounds. Respiratory: Normal respiratory effort.  No retractions. Lungs CTAB. Gastrointestinal: Soft and nontender. No distention.  Musculoskeletal: No lower extremity tenderness nor edema. No gross deformities of extremities. Neurologic:  Normal speech and language. No gross focal neurologic deficits are appreciated.  Skin:  Skin is warm, dry and intact. No rash noted. Psychiatric: Mood and affect are normal. Speech and behavior are normal.  ____________________________________________   LABS (all labs ordered are listed, but only abnormal results are displayed)  Labs Reviewed  COMPREHENSIVE METABOLIC PANEL - Abnormal; Notable for the following components:      Result Value   Potassium 3.2 (*)    Glucose, Bld 105 (*)    Creatinine, Ser 1.34 (*)    Calcium 8.2 (*)    All other components within normal limits  CBC  TROPONIN I  URINE DRUG SCREEN, QUALITATIVE ([facility] ONLY)   ____________________________________________  EKG  ED ECG REPORT I, [place] BROWN, the attending physician, personally viewed and interpreted this ECG.   Date: 04/24/2018  EKG Time: 2:48 AM  Rate: 35  Rhythm: Sinus Bradycardia  Axis: Normal  Intervals: Normal  ST&T Change: None    Procedures   ____________________________________________   INITIAL IMPRESSION / ASSESSMENT AND PLAN / ED COURSE  As part of my medical decision making, I reviewed the following data within the electronic MEDICAL RECORD NUMBER   32 year old male presenting with above-stated history and physical exam secondary to dizziness with near syncopal episode.  Patient noted to be markedly bradycardic with heart rate in the 30s.  On further questioning patient states that he took more than his  prescribed dose of clonazepam today stating that he took 2 tablets twice today for a total of 4 tablets.  Possibility that the patient's bradycardia may be secondary to benzodiazepine however patient remains persistently bradycardic.  Patient discussed with Dr. Sheryle Hail for hospital admission further evaluation and management of symptomatic bradycardia  ____________________________________________  FINAL CLINICAL IMPRESSION(S) / ED DIAGNOSES  Final diagnoses:  Syncope, unspecified syncope type  Bradycardia     MEDICATIONS GIVEN DURING THIS VISIT:  Medications  atropine injection 0.4 mg (has no administration in time range)  sodium chloride 0.9 % bolus 1,000 mL (0 mLs Intravenous Stopped 04/24/18 0350)     ED Discharge Orders    None       Note:  This document was prepared using Dragon voice recognition software and may include unintentional dictation errors.    Darci Current, MD 04/24/18 1610    Darci Current, MD 04/24/18 [phone]

## 2018-04-24 NOTE — Consult Note (Signed)
Wyline MoodKiran Indiyah Paone , MD [address], [address], [place], [place], [id number] [address], [place], [place], [address] Phone: [phone]  Fax: [phone]  Consultation  Referring Provider:  Dr Imogene Burnhen  Primary Care Physician:  Patient, No Pcp Per Primary Gastroenterologist:  None         Reason for Consultation:     Hematemesis  Date of Admission:  04/24/2018 Date of Consultation:  04/24/2018         HPI:   Corey [name] is a 32 y.o. male presented to the hospital today with LOC. Admitted with syncope. H/o black stools per ER note. Being evaluated for symptomatic bradycardia by cardiology . Taken clonazepam , clonidine and suvorexant. Atropine given in ER .   ER visit on 04/17/18 for hematemesis. , coffee ground emesis 03/26/18 -discharged on PPI He states that on Monday he had an episode of vomiting and so a few drops of blood in it and has not recurred since.  His bowel movements have been normal that is brown in color.  He feels that in the past he has had black bowel movements.  He mentions that until about 2 months back he was taking NSAIDs on a regular basis which he has since stopped.  At that point of time he did have some black-colored stools.  He denies any abdominal pain.  He has had episodes of vomiting in the past which have been clear followed with presence of some blood in it and at times has had blood along with his vomitus mixed in the past.  No further episodes of vomiting after coming into the hospital.  He suffers from heartburn and states that that is been an issue for a long time.    CBC Latest Ref Rng & Units 04/24/2018 04/19/2018 04/17/2018  WBC 3.8 - 10.6 K/uL 8.4 9.0 12.6(H)  Hemoglobin 13.0 - 18.0 g/dL 52.814.7 41.313.9 24.415.5  Hematocrit 40.0 - 52.0 % 42.3 39.4(L) 45.1  Platelets 150 - 440 K/uL 260 191 232       Past Medical History:  Diagnosis Date  . Bipolar 1 disorder ([facility])   . HTN (hypertension)     Past Surgical History:  Procedure Laterality Date  .  KNEE ARTHROSCOPY      Prior to Admission medications   Medication Sig Start Date End Date Taking? Authorizing Provider  clonazePAM (KLONOPIN) 1 MG tablet Take 1 mg by mouth 2 (two) times daily as needed for anxiety. 02/28/18  Yes [provider]  cloNIDine (CATAPRES) 0.1 MG tablet Take 0.1 mg by mouth 3 (three) times daily. 02/28/18  Yes [provider]  divalproex (DEPAKOTE ER) 500 MG 24 hr tablet Take 500 mg by mouth daily. 04/24/18  Yes [provider]  FLUoxetine (PROZAC) 40 MG capsule Take 40 mg by mouth daily. 02/28/18  Yes [provider]  hydrochlorothiazide (HYDRODIURIL) 25 MG tablet Take 1 tablet (25 mg total) by mouth daily. 01/22/18  Yes Minna AntisPaduchowski, Kevin, MD  pantoprazole (PROTONIX) 40 MG tablet Take 1 tablet (40 mg total) by mouth daily. 03/27/18 05/26/18 Yes Sainani, Rolly PancakeVivek J, MD  potassium chloride 20 MEQ TBCR Take 20 mEq by mouth 2 (two) times daily for 7 days. 04/19/18 04/26/18 Yes Dionne BucySiadecki, Sebastian, MD  sucralfate (CARAFATE) 1 g tablet Take 1 tablet (1 g total) by mouth 4 (four) times daily. 04/01/18  Yes Sharman CheekStafford, Phillip, MD  Suvorexant (BELSOMRA) 20 MG TABS Take 20 mg by mouth at bedtime as needed (sleep).  Yes [provider]  tamsulosin (FLOMAX) 0.4 MG CAPS capsule Take 1 capsule (0.4 mg total) by mouth daily. 03/27/18 04/26/18 Yes Houston Siren, MD    Family History  Problem Relation Age of Onset  . Kidney cancer Neg Hx   . Bladder Cancer Neg Hx   . Prostate cancer Neg Hx      Social History   Tobacco Use  . Smoking status: Former Games developer  . Smokeless tobacco: Current User    Types: Snuff  Substance Use Topics  . Alcohol use: No  . Drug use: Never    Allergies as of 04/24/2018 - Review Complete 04/24/2018  Allergen Reaction Noted  . Penicillins Hives 12/29/2014  . Sulfa antibiotics Hives 12/29/2014    Review of Systems:    All systems reviewed and negative except where noted in HPI.   Physical Exam:  Vital  signs in last 24 hours: Temp:  [97.5 F (36.4 C)-98 F (36.7 C)] 97.8 F (36.6 C) (09/23 0753) Pulse Rate:  [34-53] 39 (09/23 0753) Resp:  [16-19] 18 (09/23 0753) BP: (108-135)/(65-88) 129/80 (09/23 0753) SpO2:  [95 %-99 %] 97 % (09/23 0753) Weight:  [117.9 kg-119.8 kg] 119.8 kg (09/23 0655)   General:   Pleasant, cooperative in NAD Head:  Normocephalic and atraumatic. Eyes:   No icterus.   Conjunctiva pink. PERRLA. Ears:  Normal auditory acuity. Neck:  Supple; no masses or thyroidomegaly Lungs: Respirations even and unlabored. Lungs clear to auscultation bilaterally.   No wheezes, crackles, or rhonchi.  Heart:  Regular rate and rhythm; pulses regular but slow.  Without murmur, clicks, rubs or gallops Abdomen:  Soft, nondistended, nontender. Normal bowel sounds. No appreciable masses or hepatomegaly.  No rebound or guarding.  Neurologic:  Alert and oriented x3;  grossly normal neurologically. Skin: Tattoos noted Cervical Nodes:  No significant cervical adenopathy. Psych:  Alert and cooperative. Normal affect.  LAB RESULTS: Recent Labs    04/24/18 0241  WBC 8.4  HGB 14.7  HCT 42.3  PLT 260   BMET Recent Labs    04/24/18 0241  NA 140  K 3.2*  CL 104  CO2 29  GLUCOSE 105*  BUN 11  CREATININE 1.34*  CALCIUM 8.2*   LFT Recent Labs    04/24/18 0241  PROT 6.7  ALBUMIN 3.6  AST 21  ALT 25  ALKPHOS 65  BILITOT 0.4   PT/INR No results for input(s): LABPROT, INR in the last 72 hours.  STUDIES: No results found.    Impression / Plan:   Corey [name] is a 32 y.o. y/o male admitted with symptomatic bradycardia, syncope.  He is being evaluated by cardiology.  Has had an echocardiogram performed and awaiting evaluation.  I have been consulted to see him for hematemesis.  Hemoglobin has been stable.  History suggestive of acid reflux.  It is very likely possible that the episode of hematemesis is secondary to either a Mallory-Weiss tear or  esophagitis.  Since this  has been a recurrent issue I think it would warrant evaluation.  Plan  1. PPI 2. NO NSAID's 3. EGD once he has cardiac clearance probably Wednesday or Thursday if cardiac work-up is negative.  I have discussed the procedure with the patient.  Thank you for involving me in the care of this patient.      LOS: 0 days   Wyline Mood, MD  04/24/2018, 10:11 AM

## 2018-04-24 NOTE — Consult Note (Signed)
[facility] Cardiology  CARDIOLOGY CONSULT NOTE  Patient ID: Corey [name] MRN: [id number] DOB/AGE: [birth year] 32 y.o.  Admit date: 04/24/2018 Referring Physician Imogene Burn Primary Physician None per patient Primary Cardiologist None per patient Reason for Consultation Symptomatic bradycardia, syncope  HPI: 32 year old male referred for evaluation of symptomatic bradycardia and syncope. The patient has a history of hypertension, obesity, former tobacco use, and bipolar disorder. The patient reports being in his usual state of health until 04/22/18 when he had a syncopal episode with prodromal lightheadedness and dizziness. He cannot recall what he was doing at the time, but reports that he recovered rather quickly without residual symptoms. Yesterday he reports that he was doing some light shopping with his family and felt progressive lightheadedness and dizziness with ambulation. He had to leave a store and sit in the car. When he got home, he walked to the bathroom, and continued to feel lightheaded, with loss of vision. He girlfriend called EMS and he was transported to [facility] ER. In the ER he was noted to be markedly bradycardic with heart rates in the 30s. The patient reports that he unintentionally took more than his prescribed dose of clonazepam for a total of 4 tablets. ECG revealed sinus bradycardia at a rate of 35 bpm without acute ST-T wave abnormalities. CMP notable for hypokalemia, K 3.2, and creatinine 1.34. The patient denies chest pain, shortness of breath, palpitations, or recurrent lightheadedness. He denies known cardiac history.  Review of systems complete and found to be negative unless listed above     Past Medical History:  Diagnosis Date  . Bipolar 1 disorder (HCC)   . HTN (hypertension)     Past Surgical History:  Procedure Laterality Date  . KNEE ARTHROSCOPY      Medications Prior to Admission  Medication Sig Dispense Refill Last Dose  . clonazePAM (KLONOPIN) 1 MG tablet Take 1  mg by mouth 2 (two) times daily as needed for anxiety.  0 prn at prn  . cloNIDine (CATAPRES) 0.1 MG tablet Take 0.1 mg by mouth 3 (three) times daily.  0 04/23/2018 at Unknown time  . divalproex (DEPAKOTE ER) 500 MG 24 hr tablet Take 500 mg by mouth daily.  1 04/23/2018 at Unknown time  . FLUoxetine (PROZAC) 40 MG capsule Take 40 mg by mouth daily.  0 04/23/2018 at Unknown time  . hydrochlorothiazide (HYDRODIURIL) 25 MG tablet Take 1 tablet (25 mg total) by mouth daily. 30 tablet 2 04/23/2018 at Unknown time  . pantoprazole (PROTONIX) 40 MG tablet Take 1 tablet (40 mg total) by mouth daily. 30 tablet 1 04/23/2018 at Unknown time  . potassium chloride 20 MEQ TBCR Take 20 mEq by mouth 2 (two) times daily for 7 days. 14 tablet 0 04/23/2018 at Unknown time  . sucralfate (CARAFATE) 1 g tablet Take 1 tablet (1 g total) by mouth 4 (four) times daily. 60 tablet 0 04/23/2018 at Unknown time  . Suvorexant (BELSOMRA) 20 MG TABS Take 20 mg by mouth at bedtime as needed (sleep).    prn at prn  . tamsulosin (FLOMAX) 0.4 MG CAPS capsule Take 1 capsule (0.4 mg total) by mouth daily. 30 capsule 0 04/23/2018 at Unknown time   Social History   Socioeconomic History  . Marital status: Legally Separated    Spouse name: Not on file  . Number of children: 3  . Years of education: Not on file  . Highest education level: Not on file  Occupational History  . Not on file  Social Needs  . Financial resource strain: Not hard at all  . Food insecurity:    Worry: Never true    Inability: Never true  . Transportation needs:    Medical: No    Non-medical: No  Tobacco Use  . Smoking status: Former Games developer  . Smokeless tobacco: Current User    Types: Snuff  Substance and Sexual Activity  . Alcohol use: No  . Drug use: Never  . Sexual activity: Not on file  Lifestyle  . Physical activity:    Days per week: 7 days    Minutes per session: 60 min  . Stress: Not at all  Relationships  . Social connections:    Talks on  phone: More than three times a week    Gets together: More than three times a week    Attends religious service: Never    Active member of club or organization: No    Attends meetings of clubs or organizations: Never    Relationship status: Separated  . Intimate partner violence:    Fear of current or ex partner: No    Emotionally abused: No    Physically abused: No    Forced sexual activity: No  Other Topics Concern  . Not on file  Social History Narrative  . Not on file    Family History  Problem Relation Age of Onset  . Kidney cancer Neg Hx   . Bladder Cancer Neg Hx   . Prostate cancer Neg Hx       Review of systems complete and found to be negative unless listed above      PHYSICAL EXAM  General: Well developed, well nourished, in no acute distress HEENT:  Normocephalic and atramatic Neck:  No JVD.  Lungs: Normal effort of breathing on room air Heart: Bradycardic Abdomen: nondistended Msk:  Back normal. No obvious deformity Extremities: No clubbing, cyanosis or edema.   Neuro: Alert and oriented X 3. Psych:  Somnolent, responds appropriately  Labs:   Lab Results  Component Value Date   WBC 8.4 04/24/2018   HGB 14.7 04/24/2018   HCT 42.3 04/24/2018   MCV 86.2 04/24/2018   PLT 260 04/24/2018    Recent Labs  Lab 04/24/18 0241  NA 140  K 3.2*  CL 104  CO2 29  BUN 11  CREATININE 1.34*  CALCIUM 8.2*  PROT 6.7  BILITOT 0.4  ALKPHOS 65  ALT 25  AST 21  GLUCOSE 105*   Lab Results  Component Value Date   CKTOTAL 154 04/17/2018   TROPONINI <0.03 04/24/2018   No results found for: CHOL No results found for: HDL No results found for: LDLCALC No results found for: TRIG No results found for: CHOLHDL No results found for: LDLDIRECT    Radiology: Ct Renal Stone Study  Result Date: 03/26/2018 CLINICAL DATA:  Right flank/groin pain, difficulty urinating EXAM: CT ABDOMEN AND PELVIS WITHOUT CONTRAST TECHNIQUE: Multidetector CT imaging of the abdomen  and pelvis was performed following the standard protocol without IV contrast. COMPARISON:  None. FINDINGS: Lower chest: Lung bases are clear. Hepatobiliary: Unenhanced liver is unremarkable. Gallbladder is unremarkable. No intrahepatic or extrahepatic ductal dilatation. Pancreas: Within normal limits. Spleen: Within normal limits. Adrenals/Urinary Tract: Adrenal glands are within normal limits. Left kidney is within normal limits. Punctate calculus at the right UVJ (coronal image 88). Associated mild right hydroureteronephrosis. Bladder is underdistended. Stomach/Bowel: Stomach is within normal limits. No evidence of bowel obstruction. Normal appendix (series 2/image 54). Colon is decompressed.  Vascular/Lymphatic: No evidence of abdominal aortic aneurysm. No suspicious abdominopelvic lymphadenopathy. Reproductive: Prostate is unremarkable. Other: No abdominopelvic ascites. Musculoskeletal: Visualized osseous structures are within normal limits. IMPRESSION: Punctate calculus at the right UVJ. Associated mild right hydroureteronephrosis. Electronically Signed   By: Charline BillsSriyesh  Krishnan M.D.   On: 03/26/2018 11:41    EKG: Sinus bradycardia, 39 bpm  ASSESSMENT AND PLAN:  1. Symptomatic bradycardia with syncope, likely secondary to polypharmacy, as the patient took more than his prescribed clonazepam yesterday, and also takes clonidine and suvorexant. Atropine given in ER. The patient in persistent bradycardia, but is currently stable. 2. Essential hypertension, well controlled today on hydrochlorothiazide 3. Acute kidney injury, received IV fluids 4. Hypokalemia, received PO potassium  Recommendations: 1. Continue to hold clonazepam, clonidine, and suvorexant 2. Continue to monitor telemetry 3. Defer temporary pacemaker at this time 4. 2D echocardiogram  Signed: Leanora Ivanoffnna Dao Memmott PA-C 04/24/2018, 8:11 AM

## 2018-04-24 NOTE — ED Triage Notes (Signed)
Pt arrives to ED via [facility] from home with c/o syncopal episode PTA. EMS states pt did not hit the ground when he fell, but was caught by his s/o. Pt with h/x of GI bleed. Dr Manson PasseyBrown at bedside at this time.

## 2018-04-24 NOTE — Progress Notes (Signed)
Spoke with patient's wife, Corey [name], who stated patient has been having occasional bloody stool and hematemesis over the past month. Patient has a history of long time aspirin use, per wife, and was diagnosed with an ulcer within the past year. Wife also stated that patient has lost approximately 25 lbs in the past month without a known cause. Spoke with Dr. Imogene Burnhen about same.

## 2018-04-24 NOTE — Progress Notes (Signed)
*  PRELIMINARY RESULTS* Echocardiogram 2D Echocardiogram has been performed.  Joanette GulaJoan M Jadriel Saxer 04/24/2018, 12:20 PM

## 2018-04-25 LAB — HEMOGLOBIN: Hemoglobin: 13.7 g/dL (ref 13.0–18.0)

## 2018-04-25 LAB — BASIC METABOLIC PANEL
ANION GAP: 5 (ref 5–15)
BUN: 12 mg/dL (ref 6–20)
CALCIUM: 8.4 mg/dL — AB (ref 8.9–10.3)
CO2: 28 mmol/L (ref 22–32)
Chloride: 106 mmol/L (ref 98–111)
Creatinine, Ser: 1.11 mg/dL (ref 0.61–1.24)
GFR calc Af Amer: >60 mL/min (ref >60)
GFR calc non Af Amer: >60 mL/min (ref >60)
GLUCOSE: 96 mg/dL (ref 70–99)
Potassium: 3.3 mmol/L — ABNORMAL LOW (ref 3.5–5.1)
Sodium: 139 mmol/L (ref 135–145)

## 2018-04-25 LAB — ECHOCARDIOGRAM COMPLETE
HEIGHTINCHES: 70.984 in
WEIGHTICAEL: 4227.19 [oz_av]

## 2018-04-25 MED ORDER — POTASSIUM CHLORIDE CRYS ER 20 MEQ PO TBCR
20.0000 meq | EXTENDED_RELEASE_TABLET | Freq: Once | ORAL | Status: AC
  Administered 2018-04-25: 20 meq via ORAL
  Filled 2018-04-25: qty 1

## 2018-04-25 MED ORDER — SODIUM CHLORIDE 0.9 % IV SOLN
INTRAVENOUS | Status: DC
  Administered 2018-04-25 – 2018-04-26 (×2): via INTRAVENOUS

## 2018-04-25 NOTE — Progress Notes (Signed)
[facility]  2D echocardigram reveals normal left ventricular function. Patient bradycardic with heart rates in the high 30s-40s, felt to be secondary to polypharmacy. Patient is considered to be a low risk for serious cardiovascular complications when undergoing EGD. Proceed with EGD for evaluation of hematemesis if deemed necessary.   Leanora IvanoffAnna Bostyn Kunkler, PA-C

## 2018-04-25 NOTE — Plan of Care (Signed)
  Problem: Activity: Goal: Risk for activity intolerance will decrease Outcome: Progressing Note:  Up in room, gave self a bath, tolerated well   Problem: Coping: Goal: Level of anxiety will decrease Outcome: Progressing   Problem: Elimination: Goal: Will not experience complications related to urinary retention Outcome: Progressing   Problem: Pain Managment: Goal: General experience of comfort will improve Outcome: Progressing Note:  Treated for abdominal pain once, with percocet, which gave relief   Problem: Safety: Goal: Ability to remain free from injury will improve Outcome: Progressing   Problem: Skin Integrity: Goal: Risk for impaired skin integrity will decrease Outcome: Progressing   Problem: Education: Goal: Knowledge of General Education information will improve Description Including pain rating scale, medication(s)/side effects and non-pharmacologic comfort measures Outcome: Completed/Met   Problem: Nutrition: Goal: Adequate nutrition will be maintained Outcome: Completed/Met   Problem: Clinical Measurements: Goal: Will remain free from infection Outcome: Not Applicable Goal: Respiratory complications will improve Outcome: Not Applicable

## 2018-04-25 NOTE — Progress Notes (Signed)
[facility] Cardiology  SUBJECTIVE: The patient reports feeling well at rest, denying chest pain, shortness of breath, or lightheadedness. He ambulated to the sink yesterday evening to bathe and had some lightheadedness after prolonged standing.   Vitals:   04/24/18 1617 04/24/18 1929 04/25/18 0342 04/25/18 0835  BP: (!) 104/59 113/74 (!) 96/56 103/67  Pulse: (!) 51 (!) 47 (!) 41 (!) 40  Resp: 18 18 18 18   Temp: 97.6 F (36.4 C) 97.7 F (36.5 C) 98.2 F (36.8 C)   TempSrc: Oral Oral    SpO2: 97% 98% 96% 93%  Weight:   120.4 kg   Height:         Intake/Output Summary (Last 24 hours) at 04/25/2018 0855 Last data filed at 04/25/2018 0500 Gross per 24 hour  Intake 1141.01 ml  Output 950 ml  Net 191.01 ml      PHYSICAL EXAM  General: Well developed, well nourished, in no acute distress HEENT:  Normocephalic and atramatic Neck:  No JVD.  Lungs: Clear bilaterally to auscultation and percussion. Heart: Normal rhythm, bradycardic . Normal S1 and S2 without gallops or murmurs.  Abdomen: Abdomen nondistended Msk:  Back normal, gait not assessed. Able to sit upright in bed without assistance. Normal strength and tone for age. Extremities: No clubbing, cyanosis or edema.   Neuro: Alert and oriented X 3. Psych:  Good affect, responds appropriately   LABS: Basic Metabolic Panel: Recent Labs    04/24/18 0241 04/24/18 0730 04/25/18 0408  NA 140  --  139  K 3.2*  --  3.3*  CL 104  --  106  CO2 29  --  28  GLUCOSE 105*  --  96  BUN 11  --  12  CREATININE 1.34*  --  1.11  CALCIUM 8.2*  --  8.4*  MG  --  2.5*  --    Liver Function Tests: Recent Labs    04/24/18 0241  AST 21  ALT 25  ALKPHOS 65  BILITOT 0.4  PROT 6.7  ALBUMIN 3.6   No results for input(s): LIPASE, AMYLASE in the last 72 hours. CBC: Recent Labs    04/24/18 0241 04/25/18 0408  WBC 8.4  --   HGB 14.7 13.7  HCT 42.3  --   MCV 86.2  --   PLT 260  --    Cardiac Enzymes: Recent Labs    04/24/18 0241   TROPONINI <0.03   BNP: Invalid input(s): POCBNP D-Dimer: No results for input(s): DDIMER in the last 72 hours. Hemoglobin A1C: No results for input(s): HGBA1C in the last 72 hours. Fasting Lipid Panel: No results for input(s): CHOL, HDL, LDLCALC, TRIG, CHOLHDL, LDLDIRECT in the last 72 hours. Thyroid Function Tests: Recent Labs    04/24/18 0241  TSH 1.104   Anemia Panel: No results for input(s): VITAMINB12, FOLATE, FERRITIN, TIBC, IRON, RETICCTPCT in the last 72 hours.  No results found.   Echo Pending  TELEMETRY: Sinus bradycardia, 39 bpm  ASSESSMENT AND PLAN:  Active Problems:   Bradycardia   Symptomatic bradycardia    1. Symptomatic bradycardia with syncope, felt to be secondary to polypharmacy as the patient took additional Klonopin, and also takes clonidine and Belsomra, which are all currently being held. Heart rate continues to be high 30s-50 bpm. Patient reports lightheadedness with ambulation. Echocardiogram pending. Sinus bradycardia noted on telemetry. 2. Acute kidney injury 3. Hematemesis, being followed by GI who recommends ECG after cardiac clearance. Echocardiogram pending. 4. Hypokalemia, repleted, still low at 3.3  Recommendations: 1. Review 2D echocardiogram. 2. Cardiac clearance to follow pending results of echocardiogram and patient's clinical course 3. Continue to hold Klonopin, clonidine and Belsomra 4. Continue to replete K  Leanora Ivanoffnna Tyller Bowlby, PA-C 04/25/2018 8:55 AM

## 2018-04-25 NOTE — Care Management (Signed)
Cleared by Cardiology.  Will have EGD 9-25.

## 2018-04-25 NOTE — Progress Notes (Signed)
   Wyline MoodKiran Ginette Bradway , MD [address], [address], [place], [place], [address] [address], [place], [place], [address] Phone: [phone]  Fax: [phone]   Corey [name] is being followed for hematemesis  Day 1 of follow up   Subjective: Feels well , no further episodes of hematemesis   Objective: Vital signs in last 24 hours: Vitals:   04/24/18 1617 04/24/18 1929 04/25/18 0342 04/25/18 0835  BP: (!) 104/59 113/74 (!) 96/56 103/67  Pulse: (!) 51 (!) 47 (!) 41 (!) 40  Resp: 18 18 18 18   Temp: 97.6 F (36.4 C) 97.7 F (36.5 C) 98.2 F (36.8 C)   TempSrc: Oral Oral    SpO2: 97% 98% 96% 93%  Weight:   120.4 kg   Height:       Weight change: 1.905 kg  Intake/Output Summary (Last 24 hours) at 04/25/2018 1445 Last data filed at 04/25/2018 0500 Gross per 24 hour  Intake 1141.01 ml  Output 650 ml  Net 491.01 ml     Exam: Heart:: Regular rate and rhythm, S1S2 present or without murmur or extra heart sounds Lungs: normal, clear to auscultation and clear to auscultation and percussion Abdomen: soft, nontender, normal bowel sounds   Lab Results: @LABTEST2 @ Micro Results: No results found for this or any previous visit (from the past 240 hour(s)). Studies/Results: No results found. Medications: I have reviewed the patient's current medications. Scheduled Meds: . divalproex  500 mg Oral Daily  . docusate sodium  100 mg Oral BID  . enoxaparin (LOVENOX) injection  40 mg Subcutaneous Q24H  . feeding supplement (ENSURE ENLIVE)  237 mL Oral TID BM  . FLUoxetine  40 mg Oral Daily  . hydrochlorothiazide  25 mg Oral Daily  . Influenza vac split quadrivalent PF  0.5 mL Intramuscular Tomorrow-1000  . pantoprazole  40 mg Oral Daily  . potassium chloride SA  20 mEq Oral BID  . sucralfate  1 g Oral QID  . tamsulosin  0.4 mg Oral Daily   Continuous Infusions: PRN Meds:.acetaminophen **OR** acetaminophen, ondansetron **OR** ondansetron (ZOFRAN) IV,  oxyCODONE-acetaminophen   Assessment: Active Problems:   Bradycardia   Symptomatic bradycardia  Corey BickersDevin D Delgrande is a 32 y.o. y/o male admitted with symptomatic bradycardia, syncope.  He is being evaluated by cardiology.  Has had an echocardiogram performed and awaiting evaluation.  I have been consulted to see him for hematemesis.  Hemoglobin has been stable.  History suggestive of acid reflux.  It is very likely possible that the episode of hematemesis is secondary to either a Mallory-Weiss tear or  esophagitis.  Since this has been a recurrent issue I think it would warrant evaluation.  Plan  1. PPI 2. NO NSAID's 3. EGD tomorrow. Cardiology has cleared him for the procedure as low risk  I have discussed alternative options, risks & benefits,  which include, but are not limited to, bleeding, infection, perforation,respiratory complication & drug reaction.  The patient agrees with this plan & written consent will be obtained.  \   LOS: 1 day   Wyline MoodKiran Coletta Lockner, MD 04/25/2018, 2:45 PM

## 2018-04-25 NOTE — Progress Notes (Signed)
[facility]   PATIENT NAME: Corey [name]    MR#:  [id number]  DATE OF BIRTH:  05-15-1986  SUBJECTIVE:  CHIEF COMPLAINT:   Chief Complaint  Patient presents with  . Loss of Consciousness   Patient has no complaints.    Heart rate is still at 40s. REVIEW OF SYSTEMS:  Review of Systems  Constitutional: Negative for chills, fever and malaise/fatigue.  HENT: Negative for sore throat.   Eyes: Negative for blurred vision and double vision.  Respiratory: Negative for cough, hemoptysis, shortness of breath, wheezing and stridor.   Cardiovascular: Negative for chest pain, palpitations, orthopnea and leg swelling.  Gastrointestinal: Negative for abdominal pain, blood in stool, diarrhea, melena, nausea and vomiting.  Genitourinary: Negative for dysuria, flank pain and hematuria.  Musculoskeletal: Negative for back pain and joint pain.  Skin: Negative for rash.  Neurological: Negative for dizziness, sensory change, focal weakness, seizures, loss of consciousness, weakness and headaches.  Endo/Heme/Allergies: Negative for polydipsia.  Psychiatric/Behavioral: Negative for depression. The patient is not nervous/anxious.     DRUG ALLERGIES:   Allergies  Allergen Reactions  . Penicillins Hives    Has patient had a PCN reaction causing immediate rash, facial/tongue/throat swelling, SOB or lightheadedness with hypotension: Yes Has patient had a PCN reaction causing severe rash involving mucus membranes or skin necrosis: No Has patient had a PCN reaction that required hospitalization: No Has patient had a PCN reaction occurring within the last 10 years: No If all of the above answers are "NO", then may proceed with Cephalosporin use.   . Sulfa Antibiotics Hives   VITALS:  Blood pressure 103/67, pulse (!) 40, temperature 98.2 F (36.8 C), resp. rate 18, height 5' 10.98" (1.803 m), weight 120.4 kg, SpO2 93 %. PHYSICAL EXAMINATION:  Physical Exam    Constitutional: He is oriented to person, place, and time. He appears well-developed. No distress.  Obesity  HENT:  Head: Normocephalic.  Mouth/Throat: Oropharynx is clear and moist.  Eyes: Pupils are equal, round, and reactive to light. Conjunctivae and EOM are normal. No scleral icterus.  Neck: Normal range of motion. Neck supple. No JVD present. No tracheal deviation present.  Cardiovascular: Normal rate, regular rhythm and normal heart sounds. Exam reveals no gallop.  No murmur heard. Pulmonary/Chest: Effort normal and breath sounds normal. No respiratory distress. He has no wheezes. He has no rales.  Abdominal: Soft. Bowel sounds are normal. He exhibits no distension. There is no tenderness. There is no rebound.  Musculoskeletal: Normal range of motion. He exhibits no edema or tenderness.  Neurological: He is alert and oriented to person, place, and time. No cranial nerve deficit.  Skin: No rash noted. No erythema.  Psychiatric: He has a normal mood and affect.   LABORATORY PANEL:  Male CBC Recent Labs  Lab 04/24/18 0241 04/25/18 0408  WBC 8.4  --   HGB 14.7 13.7  HCT 42.3  --   PLT 260  --    ------------------------------------------------------------------------------------------------------------------ Chemistries  Recent Labs  Lab 04/24/18 0241 04/24/18 0730 04/25/18 0408  NA 140  --  139  K 3.2*  --  3.3*  CL 104  --  106  CO2 29  --  28  GLUCOSE 105*  --  96  BUN 11  --  12  CREATININE 1.34*  --  1.11  CALCIUM 8.2*  --  8.4*  MG  --  2.5*  --   AST 21  --   --  ALT 25  --   --   ALKPHOS 65  --   --   BILITOT 0.4  --   --    RADIOLOGY:  No results found. ASSESSMENT AND PLAN:   This is a 32 year old male admitted for symptomatic bradycardia.  1. Symptomatically persistent bradycardia: Given atropine in the emergency department. No pathologic rhythms noted on EKG. The patient takes multiple medications that can lower heart rate, suspected  polypharmacy, so the patient's clonidine, Belsomra and Klonopin on hold.  Echocardiograph: Is unremarkable. Cardiologist cleared for EGD.  2. AKI: Improved with with vitreous fluid. Avoid nephrotoxic agents. 3. Hypokalemia: Repleted potassium. 4. Obesity: BMI 36.2; encouraged healthy diet and exercise. 5. Hematemesis at home.  PPI and EGD after cardiac clearance per Dr. Tobi BastosAnna.  Discussed with cardiology PA. All the records are reviewed and case discussed with Care Management/Social Worker. Management plans discussed with the patient, family and they are in agreement.  CODE STATUS: Full Code  TOTAL TIME TAKING CARE OF THIS PATIENT: 32 minutes.   More than 50% of the time was spent in counseling/coordination of care: YES  POSSIBLE D/C IN 1-2 DAYS, DEPENDING ON CLINICAL CONDITION.   Shaune PollackQing Karell Tukes M.D on 04/25/2018 at 2:44 PM  Between 7am to 6pm - Pager - [phone]  After 6pm go to [email or web address] - Therapist, nutritionalpassword [email or web address]  [facility]

## 2018-04-25 NOTE — Progress Notes (Signed)
Pt complaining of epigastric pain that is stabbing, 7 out of 10. MD paged, Dr. Allena KatzPatel gave orders of percocet q6hr PRN. Will give and continue to monitor. Shirley FriarAlexis Miller, RN, BSN

## 2018-04-25 NOTE — Plan of Care (Signed)
  Problem: Pain Managment: Goal: General experience of comfort will improve Outcome: Progressing   Problem: Safety: Goal: Ability to remain free from injury will improve Outcome: Progressing   Problem: Clinical Measurements: Goal: Ability to maintain clinical measurements within normal limits will improve Outcome: Not Progressing  HR never reached 60 BPM, pt has been bradycardic all shift

## 2018-04-26 ENCOUNTER — Encounter: Payer: Self-pay | Admitting: *Deleted

## 2018-04-26 ENCOUNTER — Inpatient Hospital Stay: Payer: BLUE CROSS/BLUE SHIELD | Admitting: Anesthesiology

## 2018-04-26 ENCOUNTER — Encounter
Admission: EM | Disposition: A | Discharge status: Home / Self Care | Payer: Self-pay | Source: Home / Self Care | Attending: Internal Medicine

## 2018-04-26 HISTORY — PX: ESOPHAGOGASTRODUODENOSCOPY (EGD) WITH PROPOFOL: SHX5813

## 2018-04-26 LAB — POTASSIUM: Potassium: 3.9 mmol/L (ref 3.5–5.1)

## 2018-04-26 SURGERY — ESOPHAGOGASTRODUODENOSCOPY (EGD) WITH PROPOFOL
Anesthesia: General

## 2018-04-26 MED ORDER — PROPOFOL 500 MG/50ML IV EMUL
INTRAVENOUS | Status: DC | PRN
  Administered 2018-04-26: 160 ug/kg/min via INTRAVENOUS

## 2018-04-26 MED ORDER — LIDOCAINE HCL (PF) 2 % IJ SOLN
INTRAMUSCULAR | Status: AC
  Filled 2018-04-26: qty 10

## 2018-04-26 MED ORDER — GLYCOPYRROLATE 0.2 MG/ML IJ SOLN
INTRAMUSCULAR | Status: AC
  Filled 2018-04-26: qty 1

## 2018-04-26 MED ORDER — PROPOFOL 10 MG/ML IV BOLUS
INTRAVENOUS | Status: DC | PRN
  Administered 2018-04-26: 50 mg via INTRAVENOUS

## 2018-04-26 MED ORDER — GLYCOPYRROLATE 0.2 MG/ML IJ SOLN
INTRAMUSCULAR | Status: DC | PRN
  Administered 2018-04-26: 0.2 mg via INTRAVENOUS

## 2018-04-26 MED ORDER — LIDOCAINE HCL (CARDIAC) PF 100 MG/5ML IV SOSY
PREFILLED_SYRINGE | INTRAVENOUS | Status: DC | PRN
  Administered 2018-04-26: 60 mg via INTRAVENOUS

## 2018-04-26 MED ORDER — PROPOFOL 500 MG/50ML IV EMUL
INTRAVENOUS | Status: AC
  Filled 2018-04-26: qty 50

## 2018-04-26 NOTE — Progress Notes (Signed)
Pt discharged home with family, HR still sinus brady 40-50 bpm. Otherwise pt VSS. IV removed, and pt educated on medication regimen and follow up appointments.

## 2018-04-26 NOTE — Anesthesia Preprocedure Evaluation (Signed)
Anesthesia Evaluation  Patient identified by MRN, date of birth, ID band Patient awake    Reviewed: Allergy & Precautions, H&P , NPO status , reviewed documented beta blocker date and time   Airway Mallampati: II  TM Distance: >3 FB Neck ROM: full    Dental  (+) Poor Dentition, Chipped   Pulmonary former smoker,    Pulmonary exam normal        Cardiovascular hypertension, Normal cardiovascular exam  ECHO 04/24/18 Study Conclusions  - Left ventricle: The cavity size was normal. Wall thickness was   normal. Systolic function was normal. The estimated ejection   fraction was in the range of 55% to 65%. - Aortic valve: Valve area (VTI): 3.2 cm^2. Valve area (Vmax): 3   cm^2. Valve area (Vmean): 3.05 cm^2. - Mitral valve: Valve area by pressure half-time: 1.79 cm^2. Valve   area by continuity equation (using LVOT flow): 1.99 cm^2.    Neuro/Psych PSYCHIATRIC DISORDERS Bipolar Disorder    GI/Hepatic GERD  Medicated and Controlled,  Endo/Other    Renal/GU Renal disease     Musculoskeletal   Abdominal   Peds  Hematology   Anesthesia Other Findings Past Medical History: No date: Bipolar 1 disorder (HCC) No date: HTN (hypertension)  Past Surgical History: No date: KNEE ARTHROSCOPY  BMI    Body Mass Index:  36.96 kg/m      Reproductive/Obstetrics                             Anesthesia Physical Anesthesia Plan  ASA: III  Anesthesia Plan: General   Post-op Pain Management:    Induction: Intravenous  PONV Risk Score and Plan: 2 and Treatment may vary due to age or medical condition and TIVA  Airway Management Planned: Nasal Cannula and Natural Airway  Additional Equipment:   Intra-op Plan:   Post-operative Plan:   Informed Consent: I have reviewed the patients History and Physical, chart, labs and discussed the procedure including the risks, benefits and alternatives for the  proposed anesthesia with the patient or authorized representative who has indicated his/her understanding and acceptance.   Dental Advisory Given  Plan Discussed with: CRNA  Anesthesia Plan Comments:         Anesthesia Quick Evaluation

## 2018-04-26 NOTE — Plan of Care (Signed)
  Problem: Health Behavior/Discharge Planning: Goal: Ability to manage health-related needs will improve Outcome: Adequate for Discharge   Problem: Clinical Measurements: Goal: Ability to maintain clinical measurements within normal limits will improve Outcome: Adequate for Discharge Goal: Diagnostic test results will improve Outcome: Adequate for Discharge Goal: Cardiovascular complication will be avoided Outcome: Adequate for Discharge   Problem: Activity: Goal: Risk for activity intolerance will decrease Outcome: Adequate for Discharge   Problem: Coping: Goal: Level of anxiety will decrease Outcome: Adequate for Discharge   Problem: Elimination: Goal: Will not experience complications related to bowel motility Outcome: Adequate for Discharge Goal: Will not experience complications related to urinary retention Outcome: Adequate for Discharge   Problem: Pain Managment: Goal: General experience of comfort will improve Outcome: Adequate for Discharge   Problem: Safety: Goal: Ability to remain free from injury will improve Outcome: Adequate for Discharge   Problem: Skin Integrity: Goal: Risk for impaired skin integrity will decrease Outcome: Adequate for Discharge

## 2018-04-26 NOTE — Discharge Summary (Signed)
[facility] [place] at [facility]   PATIENT NAME: Corey [name]    MR#:  [id number]  DATE OF BIRTH:  [birth year]  DATE OF ADMISSION:  04/24/2018   ADMITTING PHYSICIAN: Arnaldo Natal, MD  DATE OF DISCHARGE: 04/26/2018 PRIMARY CARE PHYSICIAN: Patient, No Pcp Per   ADMISSION DIAGNOSIS:  Bradycardia [R00.1] Syncope, unspecified syncope type [R55] Symptomatic bradycardia [R00.1] DISCHARGE DIAGNOSIS:  Active Problems:   Bradycardia   Symptomatic bradycardia  SECONDARY DIAGNOSIS:   Past Medical History:  Diagnosis Date  . Bipolar 1 disorder (HCC)   . HTN (hypertension)    HOSPITAL COURSE:  This is a 32 year old male admitted for symptomatic bradycardia.  1. Symptomaticallypersistentbradycardia: Given atropine in the emergency department. No pathologic rhythms noted on EKG. The patient takes multiple medications that can lower heart rate,suspectedpolypharmacy, sothe patient's clonidine, Belsomra and Klonopinon hold.Echocardiograph: Is unremarkable. Cardiologist cleared for EGD. Still bradycardia but echocardiogram is normal, follow-up as outpatient per Dr. Darrold Junker.  2. AKI: Improved with with vitreous fluid. Avoid nephrotoxic agents. 3. Hypokalemia: Repletedpotassium and improved. 4. Obesity: BMI 36.2; encouraged healthy diet and exercise. 5.Hematemesisat home. The patient is on Carafate andPPI at home. EGD is normal per Dr. Tobi Bastos.  Discussed with  Dr. Darrold Junker and Dr. Tobi Bastos.  DISCHARGE CONDITIONS:  Stable, discharge to home today. CONSULTS OBTAINED:  Treatment Team:  Marcina Millard, MD Shaune Pollack, MD DRUG ALLERGIES:   Allergies  Allergen Reactions  . Penicillins Hives    Has patient had a PCN reaction causing immediate rash, facial/tongue/throat swelling, SOB or lightheadedness with hypotension: Yes Has patient had a PCN reaction causing severe rash involving mucus membranes or skin necrosis: No Has patient had a PCN  reaction that required hospitalization: No Has patient had a PCN reaction occurring within the last 10 years: No If all of the above answers are "NO", then may proceed with Cephalosporin use.   . Sulfa Antibiotics Hives   DISCHARGE MEDICATIONS:   Allergies as of 04/26/2018      Reactions   Penicillins Hives   Has patient had a PCN reaction causing immediate rash, facial/tongue/throat swelling, SOB or lightheadedness with hypotension: Yes Has patient had a PCN reaction causing severe rash involving mucus membranes or skin necrosis: No Has patient had a PCN reaction that required hospitalization: No Has patient had a PCN reaction occurring within the last 10 years: No If all of the above answers are "NO", then may proceed with Cephalosporin use.   Sulfa Antibiotics Hives      Medication List    STOP taking these medications   BELSOMRA 20 MG Tabs Generic drug:  Suvorexant   clonazePAM 1 MG tablet Commonly known as:  KLONOPIN   cloNIDine 0.1 MG tablet Commonly known as:  CATAPRES   Potassium Chloride ER 20 MEQ Tbcr     TAKE these medications   divalproex 500 MG 24 hr tablet Commonly known as:  DEPAKOTE ER Take 500 mg by mouth daily.   FLUoxetine 40 MG capsule Commonly known as:  PROZAC Take 40 mg by mouth daily.   hydrochlorothiazide 25 MG tablet Commonly known as:  HYDRODIURIL Take 1 tablet (25 mg total) by mouth daily.   pantoprazole 40 MG tablet Commonly known as:  PROTONIX Take 1 tablet (40 mg total) by mouth daily.   sucralfate 1 g tablet Commonly known as:  CARAFATE Take 1 tablet (1 g total) by mouth 4 (four) times daily.   tamsulosin 0.4 MG Caps capsule Commonly known as:  FLOMAX  Take 1 capsule (0.4 mg total) by mouth daily.        DISCHARGE INSTRUCTIONS:  See AVS.  If you experience worsening of your admission symptoms, develop shortness of breath, life threatening emergency, suicidal or homicidal thoughts you must seek medical attention  immediately by calling 911 or calling your MD immediately  if symptoms less severe.  You Must read complete instructions/literature along with all the possible adverse reactions/side effects for all the Medicines you take and that have been prescribed to you. Take any new Medicines after you have completely understood and accpet all the possible adverse reactions/side effects.   Please note  You were cared for by a hospitalist during your hospital stay. If you have any questions about your discharge medications or the care you received while you were in the hospital after you are discharged, you can call the unit and asked to speak with the hospitalist on call if the hospitalist that took care of you is not available. Once you are discharged, your primary care physician will handle any further medical issues. Please note that NO REFILLS for any discharge medications will be authorized once you are discharged, as it is imperative that you return to your primary care physician (or establish a relationship with a primary care physician if you do not have one) for your aftercare needs so that they can reassess your need for medications and monitor your lab values.    On the day of Discharge:  VITAL SIGNS:  Blood pressure 116/83, pulse (!) 38, temperature 97.6 F (36.4 C), temperature source Oral, resp. rate 18, height 5\' 11"  (1.803 m), weight 120.2 kg, SpO2 99 %. PHYSICAL EXAMINATION:  GENERAL:  32 y.o.-year-old patient lying in the bed with no acute distress.  EYES: Pupils equal, round, reactive to light and accommodation. No scleral icterus. Extraocular muscles intact.  HEENT: Head atraumatic, normocephalic. Oropharynx and nasopharynx clear.  NECK:  Supple, no jugular venous distention. No thyroid enlargement, no tenderness.  LUNGS: Normal breath sounds bilaterally, no wheezing, rales,rhonchi or crepitation. No use of accessory muscles of respiration.  CARDIOVASCULAR: S1, S2 normal. No murmurs,  rubs, or gallops.  ABDOMEN: Soft, non-tender, non-distended. Bowel sounds present. No organomegaly or mass.  EXTREMITIES: No pedal edema, cyanosis, or clubbing.  NEUROLOGIC: Cranial nerves II through XII are intact. Muscle strength 5/5 in all extremities. Sensation intact. Gait not checked.  PSYCHIATRIC: The patient is alert and oriented x 3.  SKIN: No obvious rash, lesion, or ulcer.  DATA REVIEW:   CBC Recent Labs  Lab 04/24/18 0241 04/25/18 0408  WBC 8.4  --   HGB 14.7 13.7  HCT 42.3  --   PLT 260  --     Chemistries  Recent Labs  Lab 04/24/18 0241 04/24/18 0730 04/25/18 0408 04/26/18 0434  NA 140  --  139  --   K 3.2*  --  3.3* 3.9  CL 104  --  106  --   CO2 29  --  28  --   GLUCOSE 105*  --  96  --   BUN 11  --  12  --   CREATININE 1.34*  --  1.11  --   CALCIUM 8.2*  --  8.4*  --   MG  --  2.5*  --   --   AST 21  --   --   --   ALT 25  --   --   --   ALKPHOS 65  --   --   --  BILITOT 0.4  --   --   --      Microbiology Results  Results for orders placed or performed in visit on 04/10/18  Microscopic Examination     Status: Abnormal   Collection Time: 04/10/18 10:43 AM  Result Value Ref Range Status   WBC, UA 6-10 (A) 0 - 5 /hpf Final   RBC, UA None seen 0 - 2 /hpf Final   Epithelial Cells (non renal) None seen 0 - 10 /hpf Final   Casts Present (A) None seen /lpf Final   Cast Type Hyaline casts N/A Final   Mucus, UA Present (A) Not Estab. Final   Bacteria, UA Many (A) None seen/Few Final    RADIOLOGY:  No results found.   Management plans discussed with the patient, family and they are in agreement.  CODE STATUS: Full Code   TOTAL TIME TAKING CARE OF THIS PATIENT: 33 minutes.    Shaune Pollack M.D on 04/26/2018 at 3:39 PM  Between 7am to 6pm - Pager - [phone]  After 6pm go to [email or web address] - Social research officer, government  [facility]  Office  [phone]  CC: Primary care physician; Patient, No Pcp Per   Note: This  dictation was prepared with Dragon dictation along with smaller phrase technology. Any transcriptional errors that result from this process are unintentional.

## 2018-04-26 NOTE — Plan of Care (Signed)
  Problem: Clinical Measurements: Goal: Diagnostic test results will improve Outcome: Progressing Note:  Potassium now back to normal at 3.9   Problem: Activity: Goal: Risk for activity intolerance will decrease Outcome: Progressing   Problem: Coping: Goal: Level of anxiety will decrease Outcome: Progressing   Problem: Elimination: Goal: Will not experience complications related to urinary retention Outcome: Progressing   Problem: Pain Managment: Goal: General experience of comfort will improve Outcome: Progressing   Problem: Safety: Goal: Ability to remain free from injury will improve Outcome: Progressing   Problem: Skin Integrity: Goal: Risk for impaired skin integrity will decrease Outcome: Progressing

## 2018-04-26 NOTE — Anesthesia Post-op Follow-up Note (Signed)
Anesthesia QCDR form completed.        

## 2018-04-26 NOTE — H&P (Signed)
Wyline Mood, MD [address], [address], [place], [place], [address] [address], [place], [place], [address] Phone: [phone]  Fax: [phone]  Primary Care Physician:  Patient, No Pcp Per   Pre-Procedure History & Physical: HPI:  MICHAEL WALRATH is a 32 y.o. male is here for an endoscopy    Past Medical History:  Diagnosis Date  . Bipolar 1 disorder (HCC)   . HTN (hypertension)     Past Surgical History:  Procedure Laterality Date  . KNEE ARTHROSCOPY      Prior to Admission medications   Medication Sig Start Date End Date Taking? Authorizing Provider  clonazePAM (KLONOPIN) 1 MG tablet Take 1 mg by mouth 2 (two) times daily as needed for anxiety. 02/28/18  Yes [provider]  cloNIDine (CATAPRES) 0.1 MG tablet Take 0.1 mg by mouth 3 (three) times daily. 02/28/18  Yes [provider]  divalproex (DEPAKOTE ER) 500 MG 24 hr tablet Take 500 mg by mouth daily. 04/24/18  Yes [provider]  FLUoxetine (PROZAC) 40 MG capsule Take 40 mg by mouth daily. 02/28/18  Yes [provider]  hydrochlorothiazide (HYDRODIURIL) 25 MG tablet Take 1 tablet (25 mg total) by mouth daily. 01/22/18  Yes Minna Antis, MD  pantoprazole (PROTONIX) 40 MG tablet Take 1 tablet (40 mg total) by mouth daily. 03/27/18 05/26/18 Yes Sainani, Rolly Pancake, MD  potassium chloride 20 MEQ TBCR Take 20 mEq by mouth 2 (two) times daily for 7 days. 04/19/18 04/26/18 Yes Dionne Bucy, MD  sucralfate (CARAFATE) 1 g tablet Take 1 tablet (1 g total) by mouth 4 (four) times daily. 04/01/18  Yes Sharman Cheek, MD  Suvorexant (BELSOMRA) 20 MG TABS Take 20 mg by mouth at bedtime as needed (sleep).    Yes [provider]  tamsulosin (FLOMAX) 0.4 MG CAPS capsule Take 1 capsule (0.4 mg total) by mouth daily. 03/27/18 04/26/18 Yes Houston Siren, MD    Allergies as of 04/24/2018 - Review Complete 04/24/2018  Allergen Reaction Noted  . Penicillins Hives  12/29/2014  . Sulfa antibiotics Hives 12/29/2014    Family History  Problem Relation Age of Onset  . Kidney cancer Neg Hx   . Bladder Cancer Neg Hx   . Prostate cancer Neg Hx     Social History   Socioeconomic History  . Marital status: Legally Separated    Spouse name: Not on file  . Number of children: 3  . Years of education: Not on file  . Highest education level: Not on file  Occupational History  . Not on file  Social Needs  . Financial resource strain: Not hard at all  . Food insecurity:    Worry: Never true    Inability: Never true  . Transportation needs:    Medical: No    Non-medical: No  Tobacco Use  . Smoking status: Former Games developer  . Smokeless tobacco: Current User    Types: Snuff  Substance and Sexual Activity  . Alcohol use: No  . Drug use: Never  . Sexual activity: Not on file  Lifestyle  . Physical activity:    Days per week: 7 days    Minutes per session: 60 min  . Stress: Not at all  Relationships  . Social connections:    Talks on phone: More than three times a week    Gets together: More than three times a week    Attends religious service: Never  Active member of club or organization: No    Attends meetings of clubs or organizations: Never    Relationship status: Separated  . Intimate partner violence:    Fear of current or ex partner: No    Emotionally abused: No    Physically abused: No    Forced sexual activity: No  Other Topics Concern  . Not on file  Social History Narrative  . Not on file    Review of Systems: See HPI, otherwise negative ROS  Physical Exam: BP 111/64   Pulse (!) 42   Temp (!) 97.5 F (36.4 C)   Resp 18   Ht 5\' 11"  (1.803 m)   Wt 120.2 kg   SpO2 99%   BMI 36.96 kg/m  General:   Alert,  pleasant and cooperative in NAD Head:  Normocephalic and atraumatic. Neck:  Supple; no masses or thyromegaly. Lungs:  Clear throughout to auscultation, normal respiratory effort.    Heart:  +S1, +S2, Regular rate  and rhythm, No edema. Abdomen:  Soft, nontender and nondistended. Normal bowel sounds, without guarding, and without rebound.   Neurologic:  Alert and  oriented x4;  grossly normal neurologically.  Impression/Plan: CAEDIN MOGAN is here for an endoscopy  to be performed for  evaluation of GI bleed    Risks, benefits, limitations, and alternatives regarding endoscopy have been reviewed with the patient.  Questions have been answered.  All parties agreeable.   Wyline Mood, MD  04/26/2018, 12:07 PM

## 2018-04-26 NOTE — Transfer of Care (Signed)
Immediate Anesthesia Transfer of Care Note  Patient: Corey [name]  Procedure(s) Performed: ESOPHAGOGASTRODUODENOSCOPY (EGD) WITH PROPOFOL (N/A )  Patient Location: PACU  Anesthesia Type:General  Level of Consciousness: awake and patient cooperative  Airway & Oxygen Therapy: Patient Spontanous Breathing and Patient connected to nasal cannula oxygen  Post-op Assessment: Report given to RN and Post -op Vital signs reviewed and stable  Post vital signs: Reviewed and stable  Last Vitals:  Vitals Value Taken Time  BP    Temp    Pulse    Resp    SpO2      Last Pain:  Vitals:   04/26/18 1202  TempSrc: Tympanic  PainSc: 0-No pain      Patients Stated Pain Goal: 3 (04/24/18 1959)  Complications: No apparent anesthesia complications

## 2018-04-26 NOTE — Progress Notes (Signed)
[facility] [place] at [facility] [place] was admitted to the Hospital on 04/24/2018 and Discharged  04/26/2018 and should be excused from work/school until October 7 , may return to work/school without any restrictions depending on cardiologist's recommendation.  Shaune Pollack M.D on 04/26/2018,at 4:53 PM  [facility] [place] at [facility]  [phone]

## 2018-04-26 NOTE — Op Note (Signed)
[facility]astroenterology Patient Name: Corey [name] Procedure Date: 04/26/2018 12:10 PM MRN: [id number] Account #: [id number] Date of Birth: August 21, 1985 Admit Type: Outpatient Age: 32 Room: [facility] ENDO ROOM 2 Gender: Male Note Status: Finalized Procedure:            Upper GI endoscopy Indications:          Hematemesis Providers:            Wyline Mood MD, MD Referring MD:         No Local Md, MD (Referring MD) Medicines:            Monitored Anesthesia Care Complications:        No immediate complications. Procedure:            Pre-Anesthesia Assessment:                       - Prior to the procedure, a History and Physical was                        performed, and patient medications, allergies and                        sensitivities were reviewed. The patient's tolerance of                        previous anesthesia was reviewed.                       - The risks and benefits of the procedure and the                        sedation options and risks were discussed with the                        patient. All questions were answered and informed                        consent was obtained.                       - ASA Grade Assessment: II - A patient with mild                        systemic disease.                       After obtaining informed consent, the endoscope was                        passed under direct vision. Throughout the procedure,                        the patient's blood pressure, pulse, and oxygen                        saturations were monitored continuously. The Endoscope                        was introduced through the mouth, and advanced to the  third part of duodenum. The upper GI endoscopy was                        accomplished with ease. The patient tolerated the                        procedure well. Findings:      The esophagus was normal.      The stomach was normal.      The examined duodenum was  normal. Impression:           - Normal esophagus.                       - Normal stomach.                       - Normal examined duodenum.                       - No specimens collected. Recommendation:       - Return patient to hospital ward for ongoing care.                       - Advance diet as tolerated.                       - Continue present medications.                       - Likely hematemesis secondary to [facility] weiss tear.                        Vomiting may be related to use of cannabis.Suggest to                        stop. Procedure Code(s):    --- Professional ---                       [phone], Esophagogastroduodenoscopy, flexible, transoral;                        diagnostic, including collection of specimen(s) by                        brushing or washing, when performed (separate procedure) Diagnosis Code(s):    --- Professional ---                       K92.0, Hematemesis CPT copyright 2017 American Medical Association. All rights reserved. The codes documented in this report are preliminary and upon coder review may  be revised to meet current compliance requirements. Wyline Mood, MD Wyline Mood MD, MD 04/26/2018 12:28:14 PM This report has been signed electronically. Number of Addenda: 0 Note Initiated On: 04/26/2018 12:10 PM      [facility]

## 2018-04-26 NOTE — OR Nursing (Signed)
REPORT CALLED TO ASHLEY ON 2A. REPORTED HEART RATE IN 40S AND RECEIVING ROBINAL DURING PROCEDURE. TOLERATING CLEAR LIQUIDS AFTER EGD. WILL BE TRANSPORTED BACK TO FLOOR

## 2018-04-27 ENCOUNTER — Encounter: Payer: Self-pay | Admitting: Gastroenterology

## 2018-04-27 NOTE — Anesthesia Postprocedure Evaluation (Signed)
Anesthesia Post Note  Patient: Corey [name]  Procedure(s) Performed: ESOPHAGOGASTRODUODENOSCOPY (EGD) WITH PROPOFOL (N/A )  Patient location during evaluation: Endoscopy Anesthesia Type: General Level of consciousness: awake and alert Pain management: pain level controlled Vital Signs Assessment: post-procedure vital signs reviewed and stable Respiratory status: spontaneous breathing, nonlabored ventilation and respiratory function stable Cardiovascular status: blood pressure returned to baseline and stable Postop Assessment: no apparent nausea or vomiting Anesthetic complications: no     Last Vitals:  Vitals:   04/26/18 1235 04/26/18 1348  BP: (!) 115/58 116/83  Pulse:  (!) 38  Resp: 14 18  Temp: (!) 36.1 C 36.4 C  SpO2:  99%    Last Pain:  Vitals:   04/26/18 1348  TempSrc: Oral  PainSc:                  Christia Reading

## 2018-04-29 ENCOUNTER — Inpatient Hospital Stay
Admission: EM | Admit: 2018-04-29 | Discharge: 2018-05-02 | Disposition: A | Discharge status: Home / Self Care | Payer: BLUE CROSS/BLUE SHIELD | Source: Home / Self Care | Attending: Internal Medicine | Admitting: Internal Medicine

## 2018-04-29 ENCOUNTER — Encounter: Payer: Self-pay | Admitting: Emergency Medicine

## 2018-04-29 ENCOUNTER — Emergency Department: Payer: BLUE CROSS/BLUE SHIELD

## 2018-04-29 ENCOUNTER — Other Ambulatory Visit: Payer: Self-pay

## 2018-04-29 DIAGNOSIS — I959 Hypotension, unspecified: Secondary | ICD-10-CM | POA: Diagnosis present

## 2018-04-29 DIAGNOSIS — R55 Syncope and collapse: Secondary | ICD-10-CM | POA: Diagnosis present

## 2018-04-29 DIAGNOSIS — R001 Bradycardia, unspecified: Secondary | ICD-10-CM

## 2018-04-29 DIAGNOSIS — I951 Orthostatic hypotension: Secondary | ICD-10-CM

## 2018-04-29 LAB — COMPREHENSIVE METABOLIC PANEL
ALBUMIN: 3.9 g/dL (ref 3.5–5.0)
ALT: 41 U/L (ref 0–44)
AST: 34 U/L (ref 15–41)
Alkaline Phosphatase: 68 U/L (ref 38–126)
Anion gap: 7 (ref 5–15)
BILIRUBIN TOTAL: 0.7 mg/dL (ref 0.3–1.2)
BUN: 16 mg/dL (ref 6–20)
CHLORIDE: 109 mmol/L (ref 98–111)
CO2: 24 mmol/L (ref 22–32)
CREATININE: 1.28 mg/dL — AB (ref 0.61–1.24)
Calcium: 8.8 mg/dL — ABNORMAL LOW (ref 8.9–10.3)
GFR calc Af Amer: >60 mL/min (ref >60)
Glucose, Bld: 125 mg/dL — ABNORMAL HIGH (ref 70–99)
POTASSIUM: 3.8 mmol/L (ref 3.5–5.1)
Sodium: 140 mmol/L (ref 135–145)
Total Protein: 7 g/dL (ref 6.5–8.1)

## 2018-04-29 LAB — GLUCOSE, CAPILLARY
Glucose-Capillary: 86 mg/dL (ref 70–99)
Glucose-Capillary: 112 mg/dL — ABNORMAL HIGH (ref 70–99)
Glucose-Capillary: 83 mg/dL (ref 70–99)

## 2018-04-29 LAB — CBC
HCT: 44.9 % (ref 40.0–52.0)
Hemoglobin: 15.6 g/dL (ref 13.0–18.0)
MCH: 30.2 pg (ref 26.0–34.0)
MCHC: 34.7 g/dL (ref 32.0–36.0)
MCV: 87 fL (ref 80.0–100.0)
Platelets: 291 10*3/uL (ref 150–440)
RBC: 5.16 MIL/uL (ref 4.40–5.90)
RDW: 13.6 % (ref 11.5–14.5)
WBC: 8.1 10*3/uL (ref 3.8–10.6)

## 2018-04-29 LAB — MAGNESIUM: Magnesium: 2.4 mg/dL (ref 1.7–2.4)

## 2018-04-29 LAB — ETHANOL

## 2018-04-29 LAB — SALICYLATE LEVEL: Salicylate Lvl: <7 mg/dL (ref 2.8–30.0)

## 2018-04-29 LAB — T4, FREE: FREE T4: 1.01 ng/dL (ref 0.82–1.77)

## 2018-04-29 LAB — TROPONIN I: Troponin I: <0.03 ng/mL (ref <0.03)

## 2018-04-29 LAB — VALPROIC ACID LEVEL: Valproic Acid Lvl: <10 ug/mL — ABNORMAL LOW (ref 50.0–100.0)

## 2018-04-29 LAB — ACETAMINOPHEN LEVEL: Acetaminophen (Tylenol), Serum: <10 ug/mL — ABNORMAL LOW (ref 10–30)

## 2018-04-29 LAB — PHOSPHORUS: PHOSPHORUS: 4 mg/dL (ref 2.5–4.6)

## 2018-04-29 LAB — TSH: TSH: 0.907 u[IU]/mL (ref 0.350–4.500)

## 2018-04-29 MED ORDER — ONDANSETRON HCL 4 MG/2ML IJ SOLN: 4.0000 mg | Freq: Four times a day (QID) | INTRAMUSCULAR | Status: DC | PRN

## 2018-04-29 MED ORDER — ONDANSETRON HCL 4 MG PO TABS: 4.0000 mg | ORAL_TABLET | Freq: Four times a day (QID) | ORAL | Status: DC | PRN

## 2018-04-29 MED ORDER — ENOXAPARIN SODIUM 40 MG/0.4ML ~~LOC~~ SOLN
40.0000 mg | SUBCUTANEOUS | Status: DC
  Administered 2018-04-30 – 2018-05-01 (×2): 40 mg via SUBCUTANEOUS
  Filled 2018-04-29 (×2): qty 0.4

## 2018-04-29 MED ORDER — DIVALPROEX SODIUM ER 500 MG PO TB24
500.0000 mg | ORAL_TABLET | Freq: Every day | ORAL | Status: DC
  Administered 2018-04-29 – 2018-05-02 (×4): 500 mg via ORAL
  Filled 2018-04-29 (×4): qty 1

## 2018-04-29 MED ORDER — SODIUM CHLORIDE 0.9 % IV BOLUS
500.0000 mL | Freq: Once | INTRAVENOUS | Status: AC
  Administered 2018-04-29: 500 mL via INTRAVENOUS

## 2018-04-29 MED ORDER — SUCRALFATE 1 G PO TABS
1.0000 g | ORAL_TABLET | Freq: Four times a day (QID) | ORAL | Status: DC
  Administered 2018-04-29 – 2018-05-02 (×12): 1 g via ORAL
  Filled 2018-04-29 (×12): qty 1

## 2018-04-29 MED ORDER — SENNOSIDES-DOCUSATE SODIUM 8.6-50 MG PO TABS: 1.0000 | ORAL_TABLET | Freq: Every evening | ORAL | Status: DC | PRN

## 2018-04-29 MED ORDER — PANTOPRAZOLE SODIUM 40 MG PO TBEC
40.0000 mg | DELAYED_RELEASE_TABLET | Freq: Every day | ORAL | Status: DC
  Administered 2018-04-29 – 2018-05-02 (×4): 40 mg via ORAL
  Filled 2018-04-29 (×4): qty 1

## 2018-04-29 MED ORDER — ACETAMINOPHEN 650 MG RE SUPP: 650.0000 mg | Freq: Four times a day (QID) | RECTAL | Status: DC | PRN

## 2018-04-29 MED ORDER — ACETAMINOPHEN 325 MG PO TABS
650.0000 mg | ORAL_TABLET | Freq: Four times a day (QID) | ORAL | Status: DC | PRN
  Administered 2018-05-01: 650 mg via ORAL
  Filled 2018-04-29: qty 2

## 2018-04-29 MED ORDER — BISACODYL 5 MG PO TBEC: 5.0000 mg | DELAYED_RELEASE_TABLET | Freq: Every day | ORAL | Status: DC | PRN

## 2018-04-29 MED ORDER — SODIUM CHLORIDE 0.9% FLUSH
3.0000 mL | Freq: Two times a day (BID) | INTRAVENOUS | Status: DC
  Administered 2018-04-29 – 2018-05-02 (×6): 3 mL via INTRAVENOUS

## 2018-04-29 MED ORDER — ENOXAPARIN SODIUM 40 MG/0.4ML ~~LOC~~ SOLN
40.0000 mg | Freq: Two times a day (BID) | SUBCUTANEOUS | Status: DC
  Administered 2018-04-29: 40 mg via SUBCUTANEOUS
  Filled 2018-04-29: qty 0.4

## 2018-04-29 MED ORDER — FLUOXETINE HCL 20 MG PO CAPS
40.0000 mg | ORAL_CAPSULE | Freq: Every day | ORAL | Status: DC
  Administered 2018-04-29 – 2018-05-02 (×4): 40 mg via ORAL
  Filled 2018-04-29 (×4): qty 2

## 2018-04-29 MED ORDER — LACTATED RINGERS IV BOLUS
2000.0000 mL | Freq: Once | INTRAVENOUS | Status: AC
  Administered 2018-04-29: 2000 mL via INTRAVENOUS

## 2018-04-29 MED ORDER — DEXTROSE-NACL 5-0.45 % IV SOLN
INTRAVENOUS | Status: DC
  Administered 2018-04-29: 01:00:00 via INTRAVENOUS

## 2018-04-29 NOTE — ED Triage Notes (Signed)
Pt arrived via EMS from home where pt was witnessed stumbling and reported dizziness. Pt orthostatic on scene with EMS, 119/66 (60,) 53/40 (100,) 104/52 (60.) Pt is A&O x4 on arrival, pale in color. Pt denies chest pain and SOB. Pt has cardiologist appointment October 3. Pt scene numerous times for same and told he is in need of pacemaker.

## 2018-04-29 NOTE — Consult Note (Signed)
[facility] Cardiology Consultation Note  Patient ID: Corey [name], MRN: [id number], DOB/AGE: 1986/06/07 32 y.o. Admit date: 04/29/2018   Date of Consult: 04/29/2018 Primary Physician: Patient, No Pcp Per Primary Cardiologist: None  Chief Complaint:  Chief Complaint  Patient presents with  . Near Syncope   Reason for Consult: Could be  HPI: 32 y.o. male with known apparent hypertension and bipolar disorder for which the patient has been on appropriate previous medication management.  About 2 months ago he had a significant issue with kidney stones for which she was placed on other medication management including Flomax.  After that kidney stone incident the patient started to have significantly more episodes of dizziness weakness fatigue as well as syncope.  He had multiple episodes of syncopal episodes for which appear to be somewhat orthostatic based on information from his last episode with this admission.  Patient has had some bradycardia as well and possible poor chronotropic incompetence.  He also recently has had some hematemesis for which the patient had an upper endoscopy showing some ulceration and had stopped medication management for joint pains including BC powders for which is healed his at this time with full resolution.  Vision but has not had any myocardial infarction or chest pain or congestive heart failure.  Currently he is lying flat with no evidence of significant symptoms today  Past Medical History:  Diagnosis Date  . Bipolar 1 disorder (HCC)   . HTN (hypertension)       Surgical History:  Past Surgical History:  Procedure Laterality Date  . ESOPHAGOGASTRODUODENOSCOPY (EGD) WITH PROPOFOL N/A 04/26/2018   Procedure: ESOPHAGOGASTRODUODENOSCOPY (EGD) WITH PROPOFOL;  Surgeon: Wyline Mood, MD;  Location: [facility] ENDOSCOPY;  Service: Gastroenterology;  Laterality: N/A;  . KNEE ARTHROSCOPY       Home Meds: Prior to Admission medications   Medication Sig Start Date End  Date Taking? Authorizing Provider  divalproex (DEPAKOTE ER) 500 MG 24 hr tablet Take 500 mg by mouth daily. 04/24/18  Yes [provider]  FLUoxetine (PROZAC) 40 MG capsule Take 40 mg by mouth daily. 02/28/18  Yes [provider]  hydrochlorothiazide (HYDRODIURIL) 25 MG tablet Take 1 tablet (25 mg total) by mouth daily. 01/22/18  Yes Minna Antis, MD  pantoprazole (PROTONIX) 40 MG tablet Take 1 tablet (40 mg total) by mouth daily. 03/27/18 05/26/18 Yes Sainani, Rolly Pancake, MD  sucralfate (CARAFATE) 1 g tablet Take 1 tablet (1 g total) by mouth 4 (four) times daily. 04/01/18  Yes Sharman Cheek, MD  tamsulosin (FLOMAX) 0.4 MG CAPS capsule Take 0.4 mg by mouth daily.   Yes [provider]    Inpatient Medications:  . divalproex  500 mg Oral Daily  . [START ON 04/30/2018] enoxaparin (LOVENOX) injection  40 mg Subcutaneous Q24H  . FLUoxetine  40 mg Oral Daily  . pantoprazole  40 mg Oral Daily  . sodium chloride flush  3 mL Intravenous Q12H  . sucralfate  1 g Oral QID     Allergies:  Allergies  Allergen Reactions  . Penicillins Hives    Has patient had a PCN reaction causing immediate rash, facial/tongue/throat swelling, SOB or lightheadedness with hypotension: Yes Has patient had a PCN reaction causing severe rash involving mucus membranes or skin necrosis: No Has patient had a PCN reaction that required hospitalization: No Has patient had a PCN reaction occurring within the last 10 years: No If all of the above answers are "NO", then may proceed with Cephalosporin use.   Marland Kitchen  Sulfa Antibiotics Hives    Social History   Socioeconomic History  . Marital status: Legally Separated    Spouse name: Not on file  . Number of children: 3  . Years of education: Not on file  . Highest education level: Not on file  Occupational History  . Not on file  Social Needs  . Financial resource strain: Not hard at all  . Food insecurity:    Worry: Never true     Inability: Never true  . Transportation needs:    Medical: No    Non-medical: No  Tobacco Use  . Smoking status: Former Games developer  . Smokeless tobacco: Current User    Types: Snuff  Substance and Sexual Activity  . Alcohol use: No  . Drug use: Never  . Sexual activity: Not on file  Lifestyle  . Physical activity:    Days per week: 7 days    Minutes per session: 60 min  . Stress: Not at all  Relationships  . Social connections:    Talks on phone: More than three times a week    Gets together: More than three times a week    Attends religious service: Never    Active member of club or organization: No    Attends meetings of clubs or organizations: Never    Relationship status: Separated  . Intimate partner violence:    Fear of current or ex partner: No    Emotionally abused: No    Physically abused: No    Forced sexual activity: No  Other Topics Concern  . Not on file  Social History Narrative  . Not on file     Family History  Problem Relation Age of Onset  . Kidney cancer Neg Hx   . Bladder Cancer Neg Hx   . Prostate cancer Neg Hx      Review of Systems Positive for syncope dizziness Negative for: General:  chills, fever, night sweats or weight changes.  Cardiovascular: PND orthopnea positive for syncope dizziness  Dermatological skin lesions rashes Respiratory: Cough congestion Urologic: Frequent urination urination at night and hematuria Abdominal: negative for nausea, vomiting, diarrhea, bright red blood per rectum, melena, or hematemesis Neurologic: negative for visual changes, and/or hearing changes  All other systems reviewed and are otherwise negative except as noted above.  Labs: Recent Labs    04/29/18 0121  TROPONINI <0.03   Lab Results  Component Value Date   WBC 8.1 04/29/2018   HGB 15.6 04/29/2018   HCT 44.9 04/29/2018   MCV 87.0 04/29/2018   PLT 291 04/29/2018    Recent Labs  Lab 04/29/18 0113  NA 140  K 3.8  CL 109  CO2 24  BUN  16  CREATININE 1.28*  CALCIUM 8.8*  PROT 7.0  BILITOT 0.7  ALKPHOS 68  ALT 41  AST 34  GLUCOSE 125*   No results found for: CHOL, HDL, LDLCALC, TRIG No results found for: DDIMER  Radiology/Studies:  Ct Head Wo Contrast  Result Date: 04/29/2018 CLINICAL DATA:  32 year old male with near syncope. EXAM: CT HEAD WITHOUT CONTRAST TECHNIQUE: Contiguous axial images were obtained from the base of the skull through the vertex without intravenous contrast. COMPARISON:  Head CT dated 08/10/2010 FINDINGS: Brain: No evidence of acute infarction, hemorrhage, hydrocephalus, extra-axial collection or mass lesion/mass effect. Vascular: No hyperdense vessel or unexpected calcification. Skull: Normal. Negative for fracture or focal lesion. Sinuses/Orbits: Diffuse mucoperiosteal thickening of paranasal sinuses. No air-fluid levels. The mastoid air cells are clear.  Other: None IMPRESSION: 1. Normal noncontrast CT of the brain. 2. Mild paranasal sinus disease. Electronically Signed   By: Elgie Collard M.D.   On: 04/29/2018 02:51    EKG: Normal sinus rhythm  Weights: Filed Weights   04/29/18 1004  Weight: 120.2 kg     Physical Exam: Blood pressure 111/71, pulse (!) 42, temperature 98 F (36.7 C), temperature source Oral, resp. rate 18, height 5\' 11"  (1.803 m), weight 120.2 kg, SpO2 99 %. Body mass index is 36.97 kg/m. General: Well developed, well nourished, in no acute distress. Head eyes ears nose throat: Normocephalic, atraumatic, sclera non-icteric, no xanthomas, nares are without discharge. No apparent thyromegaly and/or mass  Lungs: Normal respiratory effort.  no wheezes, no rales, no rhonchi.  Heart: RRR with normal S1 S2. no murmur gallop, no rub, PMI is normal size and placement, carotid upstroke normal without bruit, jugular venous pressure is normal Abdomen: Soft, non-tender, non-distended with normoactive bowel sounds. No hepatomegaly. No rebound/guarding. No obvious abdominal masses.  Abdominal aorta is normal size without bruit Extremities: No edema. no cyanosis, no clubbing, no ulcers  Peripheral : 2+ bilateral upper extremity pulses, 2+ bilateral femoral pulses, 2+ bilateral dorsal pedal pulse Neuro: Alert and oriented. No facial asymmetry. No focal deficit. Moves all extremities spontaneously. Musculoskeletal: Normal muscle tone without kyphosis Psych:  Responds to questions appropriately with a normal affect.    Assessment: 32 year old male with bipolar disorder and hypertension now with orthostatic hypotension and syncope as well as bradycardia and possible poor chronotropic competence under these conditions without evidence of myocardial infarction or congestive heart failure  Plan: 1.  Continue observation over the next 24 hours for telemetry changes rhythm disturbances and/or heart block or symptomatic bradycardia 2.  Abstain from antianxiety medication management and or Flomax which may contribute to orthostatic hypotension and dizziness as well as syncope 3.  In ambulation and follow for improvements of symptoms and further diagnostics as necessary 4.  Currently at low risk for major cardiovascular concerns  Signed, Lamar Blinks M.D. [facility] 04/29/2018, 3:49 PM

## 2018-04-29 NOTE — Progress Notes (Signed)
Pharmacist - Prescriber Communication  Enoxaparin dose modified from 40 mg subcutaneously twice daily to 40 mg subcutaneously once daily due to BMI less than 40.  Alivia Cimino A. [place], [place].D., BCPS Clinical Pharmacist 04/29/18 15:02

## 2018-04-29 NOTE — Progress Notes (Signed)
Patient still has hypotension when the patient is transferred to the floor.  He feels generalized weakness and dizziness. Blood pressure 81/57, pulse 44. The patient looks lethargic and pale, obese obesity, other physical examination is unremarkable.  Given another normal saline 500 bolus.  Blood pressure is up to 111/71.  Still with bradycardia at 40's. Continue current treatment.  Follow-up cardiology consult.  Discussed with the patient and RN. Time spent about 32 minutes.

## 2018-04-29 NOTE — ED Provider Notes (Signed)
[facility] Emergency Department Provider Note   ____________________________________________   First MD Initiated Contact with Patient 04/29/18 0112     (approximate)  I have reviewed the triage vital signs and the nursing notes.   HISTORY  Chief Complaint Near Syncope    HPI Corey [name] is a 32 y.o. male brought to the ED from home via EMS with a chief complaint of near syncope.  Patient has had numerous ED visits as well as hospitalizations for hematemesis, syncope, bradycardia since August of this year.  Recently hospitalized at this facility 9/23-9/25 for syncope secondary to symptomatic bradycardia.  He was taken off several medications and has a cardiology appointment in early October for possible pacemaker placement.  EGD done during the recent hospitalization was unremarkable and hematemesis thought secondary to Mallory-Weiss tear.  Patient was asleep tonight and jumped up suddenly when his mother came to check on him.  He was orthostatic with EMS at the scene.  Denies fever, chills, chest pain, shortness of breath, abdominal pain, nausea or vomiting.  Just feels fatigued.  Denies recent travel or trauma.   Past Medical History:  Diagnosis Date  . Bipolar 1 disorder (HCC)   . HTN (hypertension)     Patient Active Problem List   Diagnosis Date Noted  . Bradycardia 04/24/2018  . 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 Kidney stone 03/26/2018    Past Surgical History:  Procedure Laterality Date  . ESOPHAGOGASTRODUODENOSCOPY (EGD) WITH PROPOFOL N/A 04/26/2018   Procedure: ESOPHAGOGASTRODUODENOSCOPY (EGD) WITH PROPOFOL;  Surgeon: Wyline Mood, MD;  Location: [facility] ENDOSCOPY;  Service: Gastroenterology;  Laterality: N/A;  . KNEE ARTHROSCOPY      Prior to Admission medications   Medication Sig Start Date End Date Taking? Authorizing Provider  divalproex (DEPAKOTE ER) 500 MG 24 hr tablet Take 500 mg by mouth daily. 04/24/18  Yes [provider]  FLUoxetine (PROZAC) 40 MG capsule Take 40 mg by mouth daily. 02/28/18  Yes [provider]  hydrochlorothiazide (HYDRODIURIL) 25 MG tablet Take 1 tablet (25 mg total) by mouth daily. 01/22/18  Yes Minna Antis, MD  pantoprazole (PROTONIX) 40 MG tablet Take 1 tablet (40 mg total) by mouth daily. 03/27/18 05/26/18 Yes Sainani, Rolly Pancake, MD  sucralfate (CARAFATE) 1 g tablet Take 1 tablet (1 g total) by mouth 4 (four) times daily. 04/01/18  Yes Sharman Cheek, MD  tamsulosin (FLOMAX) 0.4 MG CAPS capsule Take 0.4 mg by mouth daily.   Yes [provider]    Allergies Penicillins and Sulfa antibiotics  Family History  Problem Relation Age of Onset  . Kidney cancer Neg Hx   . Bladder Cancer Neg Hx   . Prostate cancer Neg Hx     Social History Social History   Tobacco Use  . Smoking status: Former Games developer  . Smokeless tobacco: Current User    Types: Snuff  Substance Use Topics  . Alcohol use: No  . Drug use: Never    Review of Systems  Constitutional: Positive for generalized weakness.  No fever/chills Eyes: No visual changes. ENT: No sore throat. Cardiovascular: Denies chest pain. Respiratory: Denies shortness of breath. Gastrointestinal: No abdominal pain.  No nausea, no vomiting.  No diarrhea.  No constipation. Genitourinary: Negative for dysuria. Musculoskeletal: Negative for back pain. Skin: Negative for rash. Neurological: Negative for headaches, focal weakness or numbness.   ____________________________________________   PHYSICAL EXAM:  VITAL SIGNS: ED Triage Vitals  Enc Vitals Group     BP  Pulse      Resp      Temp      Temp src      SpO2      Weight      Height      Head Circumference      Peak Flow      Pain Score      Pain Loc      Pain Edu?      Excl. in GC?     Constitutional: Alert and oriented.  Fatigued appearing and in no acute distress. Eyes: Conjunctivae are normal. PERRL. EOMI. Head: Atraumatic. Nose: No  congestion/rhinnorhea. Mouth/Throat: Mucous membranes are moist.  Oropharynx non-erythematous. Neck: No stridor.   Cardiovascular: Bradycardic rate, regular rhythm. Grossly normal heart sounds.  Good peripheral circulation. Respiratory: Normal respiratory effort.  No retractions. Lungs CTAB. Gastrointestinal: Soft and nontender. No distention. No abdominal bruits. No CVA tenderness. Musculoskeletal: No lower extremity tenderness nor edema.  No joint effusions. Neurologic:  Normal speech and language. No gross focal neurologic deficits are appreciated. No gait instability. Skin:  Skin is warm, dry and intact. No rash noted. Psychiatric: Mood and affect are normal. Speech and behavior are normal.  ____________________________________________   LABS (all labs ordered are listed, but only abnormal results are displayed)  Labs Reviewed  COMPREHENSIVE METABOLIC PANEL - Abnormal; Notable for the following components:      Result Value   Glucose, Bld 125 (*)    Creatinine, Ser 1.28 (*)    Calcium 8.8 (*)    All other components within normal limits  ACETAMINOPHEN LEVEL - Abnormal; Notable for the following components:   Acetaminophen (Tylenol), Serum <10 (*)    All other components within normal limits  VALPROIC ACID LEVEL - Abnormal; Notable for the following components:   Valproic Acid Lvl <10 (*)    All other components within normal limits  CBC  TROPONIN I  ETHANOL  SALICYLATE LEVEL  T4, FREE  TSH  MAGNESIUM  URINE DRUG SCREEN, QUALITATIVE ([facility] ONLY)   ____________________________________________  EKG  ED ECG REPORT I, SUNG,JADE J, the attending physician, personally viewed and interpreted this ECG.   Date: 04/29/2018  EKG Time: 0111  Rate: 61  Rhythm: normal EKG, normal sinus rhythm  Axis: LAD  Intervals:none  ST&T Change: Nonspecific  ____________________________________________  RADIOLOGY  ED MD interpretation: No ICH  Official radiology report(s): Ct Head  Wo Contrast  Result Date: 04/29/2018 CLINICAL DATA:  32 year old male with near syncope. EXAM: CT HEAD WITHOUT CONTRAST TECHNIQUE: Contiguous axial images were obtained from the base of the skull through the vertex without intravenous contrast. COMPARISON:  Head CT dated 08/10/2010 FINDINGS: Brain: No evidence of acute infarction, hemorrhage, hydrocephalus, extra-axial collection or mass lesion/mass effect. Vascular: No hyperdense vessel or unexpected calcification. Skull: Normal. Negative for fracture or focal lesion. Sinuses/Orbits: Diffuse mucoperiosteal thickening of paranasal sinuses. No air-fluid levels. The mastoid air cells are clear. Other: None IMPRESSION: 1. Normal noncontrast CT of the brain. 2. Mild paranasal sinus disease. Electronically Signed   By: Elgie Collard M.D.   On: 04/29/2018 02:51    ____________________________________________   PROCEDURES  Procedure(s) performed: None  Procedures  Critical Care performed: No  ____________________________________________   INITIAL IMPRESSION / ASSESSMENT AND PLAN / ED COURSE  As part of my medical decision making, I reviewed the following data within the electronic MEDICAL RECORD NUMBER History obtained from family, Nursing notes reviewed and incorporated, Labs reviewed, EKG interpreted, Old EKG reviewed, Old chart reviewed, Radiograph  reviewed and Notes from prior ED visits   32 year old male who returns to the ED for recurrent near syncope.  Differential diagnosis includes but is not limited to CAD, orthostatic syncope, dehydration, electrolyte abnormality, infection, ICH, etc.  Patient and mother are unsure whether he had a full syncopal episode and/or struck his head.  Will obtain CT head to evaluate for intracranial hemorrhage.  Will add thyroid panel, magnesium, check Depakote level.  IV fluids infusing.  Will reassess.  Clinical Course as of Apr 29 418  Sat Apr 29, 2018  0417 After 1 L IV fluids, orthostatics  reattempted.  Just going from lying to sitting, patient dropped his blood pressure from high 90s to the 80s accompanied by dizziness.  Discussed with hospitalist to evaluate patient in the emergency department for admission.   [JS]    Clinical Course User Index [JS] Irean Hong, MD     ____________________________________________   FINAL CLINICAL IMPRESSION(S) / ED DIAGNOSES  Final diagnoses:  Syncope and collapse  Bradycardia  Orthostatic hypotension     ED Discharge Orders    None       Note:  This document was prepared using Dragon voice recognition software and may include unintentional dictation errors.    Irean Hong, MD 04/29/18 [phone]

## 2018-04-29 NOTE — H&P (Signed)
[facility] [place] [facility]   PATIENT NAME: Corey [name]    MR#:  [id number]  DATE OF BIRTH:  [birth year]  DATE OF ADMISSION:  04/29/2018  PRIMARY CARE PHYSICIAN: Patient, No Pcp Per   REQUESTING/REFERRING PHYSICIAN: Irean Hong, MD  CHIEF COMPLAINT:   Chief Complaint  Patient presents with  . Near Syncope    HISTORY OF PRESENT ILLNESS:  Corey [name]  is a 32 y.o. male with a known history of HTN, BPH p/w syncope, hypotension, orthostasis, bradycardia. He has had multiple ED visits for lightheadedness/near-syncope and LOC/syncope. He was recently admitted from 09/23-09/25 for syncope workup and hematemesis/UGIB workup. 04/24/2018 Echo normal. 04/26/2018 EGD was normal. Pt states that he has had continued lightheadedness at home. He states he did light work throughout the day (Friday 09/27) and "took it easy". He went to bed @~2200PM. Pt's son was staying w/ pt's mother, but pt's son apparently wanted to go home. Pt's mother tried to call pt to drop his son back home, but he was fast asleep and didn't pick up the phone. She brought pt's son to pt's home after midnight, and was apparently banging on the door rather loudly in an attempt to wake the pt. He states he was startled by the noise, and leapt out of bed in alarm. He states he got lightheaded immediately, but went to the door. He states he got progressively more lightheaded, w/ blurred vision, and managed to get the door open, losing consciousness almost immediately after. EMS was called. Orthostatics (+) in ED. Sinus bradycardia w/ HR 35bpm on EKG. HR improved to 60s at the time of my assessment, and pt is comfortable, well-appearing and in no acute distress. I have since been informed that pt's HR drops into 30s-40s when pt is asleep. He is not on nodal blocking agents at home.  PAST MEDICAL HISTORY:   Past Medical History:  Diagnosis Date  . Bipolar 1 disorder (HCC)   . HTN (hypertension)     PAST  SURGICAL HISTORY:   Past Surgical History:  Procedure Laterality Date  . ESOPHAGOGASTRODUODENOSCOPY (EGD) WITH PROPOFOL N/A 04/26/2018   Procedure: ESOPHAGOGASTRODUODENOSCOPY (EGD) WITH PROPOFOL;  Surgeon: Wyline Mood, MD;  Location: [facility] ENDOSCOPY;  Service: Gastroenterology;  Laterality: N/A;  . KNEE ARTHROSCOPY      SOCIAL HISTORY:   Social History   Tobacco Use  . Smoking status: Former Games developer  . Smokeless tobacco: Current User    Types: Snuff  Substance Use Topics  . Alcohol use: No    FAMILY HISTORY:   Family History  Problem Relation Age of Onset  . Kidney cancer Neg Hx   . Bladder Cancer Neg Hx   . Prostate cancer Neg Hx     DRUG ALLERGIES:   Allergies  Allergen Reactions  . Penicillins Hives    Has patient had a PCN reaction causing immediate rash, facial/tongue/throat swelling, SOB or lightheadedness with hypotension: Yes Has patient had a PCN reaction causing severe rash involving mucus membranes or skin necrosis: No Has patient had a PCN reaction that required hospitalization: No Has patient had a PCN reaction occurring within the last 10 years: No If all of the above answers are "NO", then may proceed with Cephalosporin use.   . Sulfa Antibiotics Hives    REVIEW OF SYSTEMS:   Review of Systems  Constitutional: Positive for malaise/fatigue. Negative for chills, diaphoresis, fever and weight loss.  HENT: Negative for congestion, ear pain, hearing loss, nosebleeds, sinus  pain, sore throat and tinnitus.   Eyes: Positive for blurred vision. Negative for double vision and photophobia.  Respiratory: Negative for cough, hemoptysis, sputum production, shortness of breath and wheezing.   Cardiovascular: Negative for chest pain, palpitations, orthopnea, claudication, leg swelling and PND.  Gastrointestinal: Negative for abdominal pain, blood in stool, constipation, diarrhea, heartburn, melena, nausea and vomiting.  Genitourinary: Negative for dysuria, frequency,  hematuria and urgency.  Musculoskeletal: Negative for back pain, joint pain, myalgias and neck pain.  Skin: Negative for itching and rash.  Neurological: Positive for dizziness, loss of consciousness and weakness. Negative for tingling, tremors, sensory change, speech change, focal weakness, seizures and headaches.  Psychiatric/Behavioral: Negative for memory loss. The patient does not have insomnia.    MEDICATIONS AT HOME:   Prior to Admission medications   Medication Sig Start Date End Date Taking? Authorizing Provider  divalproex (DEPAKOTE ER) 500 MG 24 hr tablet Take 500 mg by mouth daily. 04/24/18  Yes [provider]  FLUoxetine (PROZAC) 40 MG capsule Take 40 mg by mouth daily. 02/28/18  Yes [provider]  hydrochlorothiazide (HYDRODIURIL) 25 MG tablet Take 1 tablet (25 mg total) by mouth daily. 01/22/18  Yes Minna Antis, MD  pantoprazole (PROTONIX) 40 MG tablet Take 1 tablet (40 mg total) by mouth daily. 03/27/18 05/26/18 Yes Sainani, Rolly Pancake, MD  sucralfate (CARAFATE) 1 g tablet Take 1 tablet (1 g total) by mouth 4 (four) times daily. 04/01/18  Yes Sharman Cheek, MD  tamsulosin (FLOMAX) 0.4 MG CAPS capsule Take 0.4 mg by mouth daily.   Yes [provider]      VITAL SIGNS:  Blood pressure 93/63, pulse (!) 41, temperature 98 F (36.7 C), temperature source Oral, resp. rate 15, SpO2 98 %.  PHYSICAL EXAMINATION:  Physical Exam  Constitutional: He is oriented to person, place, and time. He appears well-developed and well-nourished. He is active and cooperative.  Non-toxic appearance. He does not have a sickly appearance. He does not appear ill. No distress. He is not intubated.  HENT:  Head: Normocephalic and atraumatic.  Mouth/Throat: Oropharynx is clear and moist. No oropharyngeal exudate.  Eyes: Conjunctivae, EOM and lids are normal. No scleral icterus.  Neck: Neck supple. No JVD present. No thyromegaly present.  Cardiovascular: Regular rhythm,  S1 normal, S2 normal and normal heart sounds.  No extrasystoles are present. Bradycardia present. Exam reveals no gallop, no S3, no S4, no distant heart sounds and no friction rub.  No murmur heard. Pulmonary/Chest: Effort normal and breath sounds normal. No accessory muscle usage or stridor. No apnea, no tachypnea and no bradypnea. He is not intubated. No respiratory distress. He has no decreased breath sounds. He has no wheezes. He has no rhonchi. He has no rales.  Abdominal: Soft. Bowel sounds are normal. He exhibits no distension. There is no tenderness. There is no rigidity, no rebound and no guarding.  Musculoskeletal: Normal range of motion. He exhibits no edema or tenderness.  Lymphadenopathy:    He has no cervical adenopathy.  Neurological: He is alert and oriented to person, place, and time. He is not disoriented.  Skin: Skin is warm, dry and intact. No rash noted. He is not diaphoretic. No erythema.  Psychiatric: He has a normal mood and affect. His speech is normal and behavior is normal. Judgment and thought content normal. Cognition and memory are normal.   LABORATORY PANEL:   CBC Recent Labs  Lab 04/29/18 0121  WBC 8.1  HGB 15.6  HCT 44.9  PLT 291   ------------------------------------------------------------------------------------------------------------------  Chemistries  Recent Labs  Lab 04/29/18 0113  NA 140  K 3.8  CL 109  CO2 24  GLUCOSE 125*  BUN 16  CREATININE 1.28*  CALCIUM 8.8*  MG 2.4  AST 34  ALT 41  ALKPHOS 68  BILITOT 0.7   ------------------------------------------------------------------------------------------------------------------  Cardiac Enzymes Recent Labs  Lab 04/29/18 0121  TROPONINI <0.03   ------------------------------------------------------------------------------------------------------------------  RADIOLOGY:  Ct Head Wo Contrast  Result Date: 04/29/2018 CLINICAL DATA:  32 year old male with near syncope. EXAM: CT  HEAD WITHOUT CONTRAST TECHNIQUE: Contiguous axial images were obtained from the base of the skull through the vertex without intravenous contrast. COMPARISON:  Head CT dated 08/10/2010 FINDINGS: Brain: No evidence of acute infarction, hemorrhage, hydrocephalus, extra-axial collection or mass lesion/mass effect. Vascular: No hyperdense vessel or unexpected calcification. Skull: Normal. Negative for fracture or focal lesion. Sinuses/Orbits: Diffuse mucoperiosteal thickening of paranasal sinuses. No air-fluid levels. The mastoid air cells are clear. Other: None IMPRESSION: 1. Normal noncontrast CT of the brain. 2. Mild paranasal sinus disease. Electronically Signed   By: Elgie Collard M.D.   On: 04/29/2018 02:51   IMPRESSION AND PLAN:   A/P: 56M p/w syncope, hypotension, orthostasis, bradycardia. Recent hematemesis/UGIB. Hyperglycemia, Cr elevation, hypocalcemia. -Syncope, hypotension, orthostasis, bradycardia: Recent admission for syncope w/ complete workup. Was to see Cardiology (Dr. Darrold Junker) outpt. Recurrent syncope. Hypotensive (83/47) and orthostatic on ED arrival. Received hypotonic IVF in ED, endorses some minimal improvement but orthostatics were (+) on repeat. Resting BP has improved. Holding home PO antihypertensives. Flomax also held, alpha blockers notorious for causing orthostasis. Repeat orthostatics later. Neuro checks q4h x24hrs, FSG qHS/AC, fall precautions. Had normal Echo on 09/23. HR initially 35 on ED arrival, EKG sinus bradycardia. HR improved to 60s by the time of my assessment, but still drops to 30s-40s when pt asleep. Not on nodal blocking agents. Clonidine, Belsomra and Klonopin D/Ced on prior admission for syncope (09/23). Cardiology consult, may need Holter/event/loop vs. EP. -Recent hematemesis/UGIB: 09/25 EGD normal. Suspected Mallory-Weiss tears 2/2 N/V, c/w Protonix + Carafate. Hgb WNL. -Cr elevation: Cr 1.28 on present admission. Baseline 1.1-1.2. IVF. Monitor BMP, avoid  nephrotoxins. -Hypocalcemia: Ionized calcium. -c/w other home meds. -FEN/GI: Cardiac diet. -DVT PPx: Lovenox. -Code status: Full code. -Disposition: Observation, < 2 midnights.   All the records are reviewed and case discussed with ED provider. Management plans discussed with the patient, family and they are in agreement.  CODE STATUS: Full code.  TOTAL TIME TAKING CARE OF THIS PATIENT: 75 minutes.    Barbaraann Rondo M.D on 04/29/2018 at 4:39 AM  Between 7am to 6pm - Pager - [phone]  After 6pm go to [email or web address] - Social research officer, government  [facility] [place] [address] [facility]  Office  [phone]  CC: Primary care physician; Patient, No Pcp Per   Note: This dictation was prepared with Dragon dictation along with smaller phrase technology. Any transcriptional errors that result from this process are unintentional.

## 2018-04-30 ENCOUNTER — Encounter: Payer: Self-pay | Admitting: *Deleted

## 2018-04-30 LAB — URINE DRUG SCREEN, QUALITATIVE (ARMC ONLY)
AMPHETAMINES, UR SCREEN: NOT DETECTED
BENZODIAZEPINE, UR SCRN: NOT DETECTED
Barbiturates, Ur Screen: NOT DETECTED
CANNABINOID 50 NG, UR ~~LOC~~: POSITIVE — AB
Cocaine Metabolite,Ur ~~LOC~~: NOT DETECTED
MDMA (Ecstasy)Ur Screen: NOT DETECTED
Methadone Scn, Ur: NOT DETECTED
Opiate, Ur Screen: NOT DETECTED
PHENCYCLIDINE (PCP) UR S: NOT DETECTED
TRICYCLIC, UR SCREEN: NOT DETECTED

## 2018-04-30 LAB — BASIC METABOLIC PANEL
Anion gap: 8 (ref 5–15)
BUN: 14 mg/dL (ref 6–20)
CALCIUM: 8.7 mg/dL — AB (ref 8.9–10.3)
CO2: 26 mmol/L (ref 22–32)
Chloride: 106 mmol/L (ref 98–111)
Creatinine, Ser: 0.99 mg/dL (ref 0.61–1.24)
GFR calc Af Amer: >60 mL/min (ref >60)
Glucose, Bld: 103 mg/dL — ABNORMAL HIGH (ref 70–99)
POTASSIUM: 3.6 mmol/L (ref 3.5–5.1)
SODIUM: 140 mmol/L (ref 135–145)

## 2018-04-30 LAB — GLUCOSE, CAPILLARY
GLUCOSE-CAPILLARY: 78 mg/dL (ref 70–99)
GLUCOSE-CAPILLARY: 86 mg/dL (ref 70–99)
Glucose-Capillary: 88 mg/dL (ref 70–99)
Glucose-Capillary: 83 mg/dL (ref 70–99)
Glucose-Capillary: 92 mg/dL (ref 70–99)

## 2018-04-30 NOTE — Progress Notes (Signed)
[facility] -  at [facility]   PATIENT NAME: Corey [name]    MR#:  [id number]  DATE OF BIRTH:  [birth year]  SUBJECTIVE:  CHIEF COMPLAINT:   Chief Complaint  Patient presents with  . Near Syncope   The patient has no complaints. REVIEW OF SYSTEMS:  Review of Systems  Constitutional: Negative for chills, fever and malaise/fatigue.  HENT: Negative for sore throat.   Eyes: Negative for blurred vision and double vision.  Respiratory: Negative for cough, hemoptysis, shortness of breath, wheezing and stridor.   Cardiovascular: Negative for chest pain, palpitations, orthopnea and leg swelling.  Gastrointestinal: Negative for abdominal pain, blood in stool, diarrhea, melena, nausea and vomiting.  Genitourinary: Negative for dysuria, flank pain and hematuria.  Musculoskeletal: Negative for back pain and joint pain.  Neurological: Negative for dizziness, sensory change, focal weakness, seizures, loss of consciousness, weakness and headaches.  Endo/Heme/Allergies: Negative for polydipsia.  Psychiatric/Behavioral: Negative for depression. The patient is not nervous/anxious.     DRUG ALLERGIES:   Allergies  Allergen Reactions  . Penicillins Hives    Has patient had a PCN reaction causing immediate rash, facial/tongue/throat swelling, SOB or lightheadedness with hypotension: Yes Has patient had a PCN reaction causing severe rash involving mucus membranes or skin necrosis: No Has patient had a PCN reaction that required hospitalization: No Has patient had a PCN reaction occurring within the last 10 years: No If all of the above answers are "NO", then may proceed with Cephalosporin use.   . Sulfa Antibiotics Hives   VITALS:  Blood pressure 129/86, pulse (!) 53, temperature 97.7 F (36.5 C), resp. rate 18, height 5\' 11"  (1.803 m), weight 118.7 kg, SpO2 98 %. PHYSICAL EXAMINATION:  Physical Exam  Constitutional: He is oriented to person, place, and time.    Obesity.  HENT:  Head: Normocephalic.  Mouth/Throat: Oropharynx is clear and moist.  Eyes: Pupils are equal, round, and reactive to light. Conjunctivae and EOM are normal. No scleral icterus.  Neck: Normal range of motion. Neck supple. No JVD present. No tracheal deviation present.  Cardiovascular: Normal rate, regular rhythm and normal heart sounds. Exam reveals no gallop.  No murmur heard. Pulmonary/Chest: Effort normal and breath sounds normal. No respiratory distress. He has no wheezes. He has no rales.  Abdominal: Soft. Bowel sounds are normal. He exhibits no distension. There is no tenderness. There is no rebound.  Musculoskeletal: Normal range of motion. He exhibits no edema or tenderness.  Neurological: He is alert and oriented to person, place, and time. No cranial nerve deficit.  Skin: No rash noted. No erythema.   LABORATORY PANEL:  Male CBC Recent Labs  Lab 04/29/18 0121  WBC 8.1  HGB 15.6  HCT 44.9  PLT 291   ------------------------------------------------------------------------------------------------------------------ Chemistries  Recent Labs  Lab 04/29/18 0113 04/30/18 1012  NA 140 140  K 3.8 3.6  CL 109 106  CO2 24 26  GLUCOSE 125* 103*  BUN 16 14  CREATININE 1.28* 0.99  CALCIUM 8.8* 8.7*  MG 2.4  --   AST 34  --   ALT 41  --   ALKPHOS 68  --   BILITOT 0.7  --    RADIOLOGY:  No results found. ASSESSMENT AND PLAN:   A/P: 58M p/w syncope, hypotension, orthostasis, bradycardia. Recent hematemesis/UGIB. Hyperglycemia, Cr elevation, hypocalcemia.  Recurrent syncope, hypotension, orthostasis, bradycardia.  Patient was treated with multiple normal saline bolus yesterday.   Hypotension improved.   Bradycardia at 40s. Normal Echo on  09/23.  Not on nodal blocking agents. Clonidine, Belsomra and Klonopin D/Ced on prior admission for syncope (09/23). HCTZ and Flomax were discontinued. Continue telemetry monitor, follow-up vital signs.  Acute renal  failure due to dehydration.  Improved with IV fluid.  Marijuana abuse.  The patient was counseled.  Since the patient has recurrent admissions, he need to stay in the hospital to continue monitor. All the records are reviewed and case discussed with Care Management/Social Worker. Management plans discussed with the patient, family and they are in agreement.  CODE STATUS: Full Code  TOTAL TIME TAKING CARE OF THIS PATIENT: 26 minutes.   More than 50% of the time was spent in counseling/coordination of care: YES  POSSIBLE D/C IN 1-2 DAYS, DEPENDING ON CLINICAL CONDITION.   Shaune Pollack M.D on 04/30/2018 at 1:47 PM  Between 7am to 6pm - Pager - [phone]  After 6pm go to [email or web address] - Therapist, nutritional Hospitalists

## 2018-04-30 NOTE — Progress Notes (Addendum)
Patient ambulated around nurses station twice with standby assistance, reported feeling lightheaded and felt very fatigued afterwards and experienced some shortness of breath. HR in 70's-80's with ambulation, as soon as placed back in bed HR back down to 50's. Patient educated to call for assistance when getting OOB.

## 2018-05-01 LAB — GLUCOSE, CAPILLARY
GLUCOSE-CAPILLARY: 83 mg/dL (ref 70–99)
GLUCOSE-CAPILLARY: 98 mg/dL (ref 70–99)

## 2018-05-01 LAB — CALCIUM, IONIZED: CALCIUM, IONIZED, SERUM: 4.8 mg/dL (ref 4.5–5.6)

## 2018-05-01 MED ORDER — SODIUM CHLORIDE 0.9 % IV BOLUS
1000.0000 mL | Freq: Once | INTRAVENOUS | Status: AC
  Administered 2018-05-01: 1000 mL via INTRAVENOUS

## 2018-05-01 MED ORDER — SODIUM CHLORIDE 0.9 % IV SOLN
INTRAVENOUS | Status: DC
  Administered 2018-05-01 – 2018-05-02 (×2): via INTRAVENOUS

## 2018-05-01 NOTE — Progress Notes (Signed)
Patient Name: Corey [name] Date of Encounter: 05/01/2018  Hospital Problem List     Active Problems:   Syncope and collapse   Hypotension    Patient Profile     32 year old male with history of hypertension and bipolar disorder admitted with weakness, fatigue and apparent syncope.  He has a history of dizziness.  Previous symptoms were felt to be due to polypharmacy.  He was taken off of clonidine and Klonopin.  He states he has been off of those drugs for approximately a week.  He was readmitted with recurrent dizziness.   Subjective   Weakness and fatigue as well as dizziness  Inpatient Medications    . divalproex  500 mg Oral Daily  . enoxaparin (LOVENOX) injection  40 mg Subcutaneous Q24H  . FLUoxetine  40 mg Oral Daily  . pantoprazole  40 mg Oral Daily  . sodium chloride flush  3 mL Intravenous Q12H  . sucralfate  1 g Oral QID    Vital Signs    Vitals:   04/30/18 1629 04/30/18 1955 05/01/18 0424 05/01/18 0758  BP: 125/82 119/75 103/68 119/79  Pulse: (!) 56 (!) 52 (!) 39 (!) 45  Resp:  18 18   Temp:  98.7 F (37.1 C) 98.4 F (36.9 C) 97.9 F (36.6 C)  TempSrc:  Oral Oral Oral  SpO2:  100% 99% 97%  Weight:   118.2 kg   Height:        Intake/Output Summary (Last 24 hours) at 05/01/2018 1317 Last data filed at 05/01/2018 1024 Gross per 24 hour  Intake 363 ml  Output -  Net 363 ml   Filed Weights   04/29/18 1004 04/30/18 0412 05/01/18 0424  Weight: 120.2 kg 118.7 kg 118.2 kg    Physical Exam    GEN: Well nourished, well developed, in no acute distress.  HEENT: normal.  Neck: Supple, no JVD, carotid bruits, or masses. Cardiac: RRR, no murmurs, rubs, or gallops. No clubbing, cyanosis, edema.  Radials/DP/PT 2+ and equal bilaterally.  Respiratory:  Respirations regular and unlabored, clear to auscultation bilaterally. GI: Soft, nontender, nondistended, BS + x 4. MS: no deformity or atrophy. Skin: warm and dry, no rash. Neuro:  Strength and sensation  are intact. Psych: Normal affect.  Labs    CBC Recent Labs    04/29/18 0121  WBC 8.1  HGB 15.6  HCT 44.9  MCV 87.0  PLT 291   Basic Metabolic Panel Recent Labs    16/10/96 0113 04/29/18 1018 04/30/18 1012  NA 140  --  140  K 3.8  --  3.6  CL 109  --  106  CO2 24  --  26  GLUCOSE 125*  --  103*  BUN 16  --  14  CREATININE 1.28*  --  0.99  CALCIUM 8.8*  --  8.7*  MG 2.4  --   --   PHOS  --  4.0  --    Liver Function Tests Recent Labs    04/29/18 0113  AST 34  ALT 41  ALKPHOS 68  BILITOT 0.7  PROT 7.0  ALBUMIN 3.9   No results for input(s): LIPASE, AMYLASE in the last 72 hours. Cardiac Enzymes Recent Labs    04/29/18 0121  TROPONINI <0.03   BNP No results for input(s): BNP in the last 72 hours. D-Dimer No results for input(s): DDIMER in the last 72 hours. Hemoglobin A1C No results for input(s): HGBA1C in the last 72 hours. Fasting Lipid  Panel No results for input(s): CHOL, HDL, LDLCALC, TRIG, CHOLHDL, LDLDIRECT in the last 72 hours. Thyroid Function Tests Recent Labs    04/29/18 0113  TSH 0.907    Telemetry    Sinus bradycardia with intermittent sinus rhythm.  No pauses.  No high-grade heart block.  ECG    Sinus bradycardia  Radiology    Ct Head Wo Contrast  Result Date: 04/29/2018 CLINICAL DATA:  32 year old male with near syncope. EXAM: CT HEAD WITHOUT CONTRAST TECHNIQUE: Contiguous axial images were obtained from the base of the skull through the vertex without intravenous contrast. COMPARISON:  Head CT dated 08/10/2010 FINDINGS: Brain: No evidence of acute infarction, hemorrhage, hydrocephalus, extra-axial collection or mass lesion/mass effect. Vascular: No hyperdense vessel or unexpected calcification. Skull: Normal. Negative for fracture or focal lesion. Sinuses/Orbits: Diffuse mucoperiosteal thickening of paranasal sinuses. No air-fluid levels. The mastoid air cells are clear. Other: None IMPRESSION: 1. Normal noncontrast CT of the  brain. 2. Mild paranasal sinus disease. Electronically Signed   By: Elgie Collard M.D.   On: 04/29/2018 02:51    Assessment & Plan    32 year old male with dizziness and resting bradycardia.  Ambulation on telemetry showed increased heart rate into the 80s and 90s with ambulation with normotension.  Patient had symptoms.  He has not some weakness and fatigue at rest but no syncope.  Does not appear to require a pacemaker despite resting bradycardia as his symptoms occur when he is up and active and has a heart rate in the 80s to 90s it is unclear that a pacemaker would benefit him.  We will need to aggressively hydrate and free of sodium intake.  Consideration for outpatient Holter or tilt table test could be raised.  Signed, Darlin Priestly Hoyt Leanos MD 05/01/2018, 1:17 PM  Pager: [phone]

## 2018-05-01 NOTE — Progress Notes (Signed)
[employer] [facility] - [place] at [facility]   PATIENT NAME: Corey [name]    MR#:  [id number]  DATE OF BIRTH:  1986/02/11  SUBJECTIVE:   Still feeling dizzy and lightheaded when he walks about. Feeling very weak. No chest pain.  REVIEW OF SYSTEMS:  Review of Systems  Constitutional: Negative for chills, fever and malaise/fatigue.  HENT: Negative for sore throat.   Eyes: Negative for blurred vision and double vision.  Respiratory: Negative for cough, hemoptysis, shortness of breath, wheezing and stridor.   Cardiovascular: Negative for chest pain, palpitations, orthopnea and leg swelling.  Gastrointestinal: Negative for abdominal pain, blood in stool, diarrhea, melena, nausea and vomiting.  Genitourinary: Negative for dysuria, flank pain and hematuria.  Musculoskeletal: Negative for back pain and joint pain.  Neurological: Positive for weakness. Negative for dizziness, sensory change, focal weakness, seizures, loss of consciousness and headaches.  Endo/Heme/Allergies: Negative for polydipsia.  Psychiatric/Behavioral: Negative for depression. The patient is not nervous/anxious.    DRUG ALLERGIES:   Allergies  Allergen Reactions  . Penicillins Hives    Has patient had a PCN reaction causing immediate rash, facial/tongue/throat swelling, SOB or lightheadedness with hypotension: Yes Has patient had a PCN reaction causing severe rash involving mucus membranes or skin necrosis: No Has patient had a PCN reaction that required hospitalization: No Has patient had a PCN reaction occurring within the last 10 years: No If all of the above answers are "NO", then may proceed with Cephalosporin use.   . Sulfa Antibiotics Hives   VITALS:  Blood pressure 126/83, pulse (!) 50, temperature 97.9 F (36.6 C), temperature source Oral, resp. rate 18, height 5\' 11"  (1.803 m), weight 118.2 kg, SpO2 97 %. PHYSICAL EXAMINATION:  Physical Exam  Constitutional: He is oriented to person, place,  and time.  Obesity.  HENT:  Head: Normocephalic.  Mouth/Throat: Oropharynx is clear and moist.  Eyes: Pupils are equal, round, and reactive to light. Conjunctivae and EOM are normal. No scleral icterus.  Neck: Normal range of motion. Neck supple. No JVD present. No tracheal deviation present.  Cardiovascular: Regular rhythm and normal heart sounds. Exam reveals no gallop.  No murmur heard. bradycardic  Pulmonary/Chest: Effort normal and breath sounds normal. No respiratory distress. He has no wheezes. He has no rales.  Abdominal: Soft. Bowel sounds are normal. He exhibits no distension. There is no tenderness. There is no rebound.  Musculoskeletal: Normal range of motion. He exhibits no edema or tenderness.  Neurological: He is alert and oriented to person, place, and time. No cranial nerve deficit.  Skin: No rash noted. No erythema.   LABORATORY PANEL:  Male CBC Recent Labs  Lab 04/29/18 0121  WBC 8.1  HGB 15.6  HCT 44.9  PLT 291   ------------------------------------------------------------------------------------------------------------------ Chemistries  Recent Labs  Lab 04/29/18 0113 04/30/18 1012  NA 140 140  K 3.8 3.6  CL 109 106  CO2 24 26  GLUCOSE 125* 103*  BUN 16 14  CREATININE 1.28* 0.99  CALCIUM 8.8* 8.7*  MG 2.4  --   AST 34  --   ALT 41  --   ALKPHOS 68  --   BILITOT 0.7  --    RADIOLOGY:  No results found. ASSESSMENT AND PLAN:   Recurrent syncope/hypotension/orthostasis/bradycardia- has been admitted 3 times in the last month for this. Hypotension improved s/p IVFs. Bradycardia still in the 40s. Recent normal ECHO. - clonidine, belsomra, and klonopin discontinued at last admission - hctz and flomax discontinued this admission -  cardiology following- do not think he needs a pacemaker, recommend aggressive hydration, consider outpatient holter or tilt table test - will give bolus and start on MIVFs - cardiac monitoring  Acute renal failure-  likely due to dehydration, resolved after IVFs - continue to monitor  Marijuana abuse - counseling provided  Since the patient has recurrent admissions, he need to stay in the hospital to continue monitor. All the records are reviewed and case discussed with Care Management/Social Worker. Management plans discussed with the patient, family and they are in agreement.  CODE STATUS: Full Code  TOTAL TIME TAKING CARE OF THIS PATIENT: 32 minutes.   More than 50% of the time was spent in counseling/coordination of care: YES  POSSIBLE D/C IN 1-2 DAYS, DEPENDING ON CLINICAL CONDITION.   Jinny Blossom Askia Hazelip M.D on 05/01/2018 at 4:04 PM  Between 7am to 6pm - Pager - [phone]  After 6pm go to [email or web address] - Therapist, nutritional Hospitalists

## 2018-05-01 NOTE — Plan of Care (Signed)
  Problem: Activity: Goal: Risk for activity intolerance will decrease Outcome: Progressing   Problem: Safety: Goal: Ability to remain free from injury will improve Outcome: Progressing   Problem: Education: Goal: Knowledge of General Education information will improve Description: Including pain rating scale, medication(s)/side effects and non-pharmacologic comfort measures Outcome: Completed/Met   

## 2018-05-02 LAB — BASIC METABOLIC PANEL
Anion gap: 5 (ref 5–15)
BUN: 14 mg/dL (ref 6–20)
CALCIUM: 8.4 mg/dL — AB (ref 8.9–10.3)
CO2: 25 mmol/L (ref 22–32)
Chloride: 109 mmol/L (ref 98–111)
Creatinine, Ser: 1.04 mg/dL (ref 0.61–1.24)
GFR calc Af Amer: >60 mL/min (ref >60)
GLUCOSE: 90 mg/dL (ref 70–99)
Potassium: 3.8 mmol/L (ref 3.5–5.1)
Sodium: 139 mmol/L (ref 135–145)

## 2018-05-02 LAB — GLUCOSE, CAPILLARY
Glucose-Capillary: 79 mg/dL (ref 70–99)
Glucose-Capillary: 79 mg/dL (ref 70–99)

## 2018-05-02 LAB — CBC
HCT: 39.9 % — ABNORMAL LOW (ref 40.0–52.0)
Hemoglobin: 14 g/dL (ref 13.0–18.0)
MCH: 30.6 pg (ref 26.0–34.0)
MCHC: 35.2 g/dL (ref 32.0–36.0)
MCV: 86.8 fL (ref 80.0–100.0)
PLATELETS: 197 10*3/uL (ref 150–440)
RBC: 4.59 MIL/uL (ref 4.40–5.90)
RDW: 13.4 % (ref 11.5–14.5)
WBC: 8.7 10*3/uL (ref 3.8–10.6)

## 2018-05-02 MED ORDER — MIDODRINE HCL 5 MG PO TABS
2.5000 mg | ORAL_TABLET | Freq: Two times a day (BID) | ORAL | Status: DC
  Administered 2018-05-02: 2.5 mg via ORAL
  Filled 2018-05-02: qty 0.5

## 2018-05-02 MED ORDER — MIDODRINE HCL 2.5 MG PO TABS: 2.5000 mg | ORAL_TABLET | Freq: Two times a day (BID) | ORAL | 0 refills | Status: DC

## 2018-05-02 NOTE — Discharge Summary (Signed)
[employer] [facility] - [place] at [facility]   PATIENT NAME: Corey [name]    MR#:  [id number]  DATE OF BIRTH:  1985-12-08  DATE OF ADMISSION:  04/29/2018   ADMITTING PHYSICIAN: Barbaraann Rondo, MD  DATE OF DISCHARGE: 05/02/2018  2:33 PM  PRIMARY CARE PHYSICIAN: Patient, No Pcp Per   ADMISSION DIAGNOSIS:  Orthostatic hypotension [I95.1] Syncope and collapse [R55] Bradycardia [R00.1] DISCHARGE DIAGNOSIS:  Active Problems:   Syncope and collapse   Hypotension  SECONDARY DIAGNOSIS:   Past Medical History:  Diagnosis Date  . Bipolar 1 disorder (HCC)   . HTN (hypertension)    HOSPITAL COURSE:   Corey [name] is a 32 year old male who presented to the ED with syncope, hypotension, orthostasis, and bradycardia. He has been admitted 3 times in the last month with the same symptoms.   Recurrent syncope/hypotension/orthostasis/bradycardia- stable - initially hypotensive, but this improved with fluids - recent normal ECHO at last admission - clonidine, belsomra, and klonopin discontinued at last admission - hctz and flomax discontinued this admission - cardiology consulted- recommended outpatient holter, which patient had placed in clinic on the day of discharge - midodrine 2.5mg  bid started  Acute renal failure- resolved after IVFs - recheck Cr as an outpatient  Marijuana abuse - counseling provided  DISCHARGE CONDITIONS:  Recurrent syncope Bradycardia ARF- resolved Marijuana abuse CONSULTS OBTAINED:  Treatment Team:  Barbaraann Rondo, MD Lamar Blinks, MD Dalia Heading, MD DRUG ALLERGIES:   Allergies  Allergen Reactions  . Penicillins Hives    Has patient had a PCN reaction causing immediate rash, facial/tongue/throat swelling, SOB or lightheadedness with hypotension: Yes Has patient had a PCN reaction causing severe rash involving mucus membranes or skin necrosis: No Has patient had a PCN reaction that required hospitalization: No Has patient  had a PCN reaction occurring within the last 10 years: No If all of the above answers are "NO", then may proceed with Cephalosporin use.   . Sulfa Antibiotics Hives   DISCHARGE MEDICATIONS:   Allergies as of 05/02/2018      Reactions   Penicillins Hives   Has patient had a PCN reaction causing immediate rash, facial/tongue/throat swelling, SOB or lightheadedness with hypotension: Yes Has patient had a PCN reaction causing severe rash involving mucus membranes or skin necrosis: No Has patient had a PCN reaction that required hospitalization: No Has patient had a PCN reaction occurring within the last 10 years: No If all of the above answers are "NO", then may proceed with Cephalosporin use.   Sulfa Antibiotics Hives      Medication List    STOP taking these medications   hydrochlorothiazide 25 MG tablet Commonly known as:  HYDRODIURIL   tamsulosin 0.4 MG Caps capsule Commonly known as:  FLOMAX     TAKE these medications   divalproex 500 MG 24 hr tablet Commonly known as:  DEPAKOTE ER Take 500 mg by mouth daily.   FLUoxetine 40 MG capsule Commonly known as:  PROZAC Take 40 mg by mouth daily.   midodrine 2.5 MG tablet Commonly known as:  PROAMATINE Take 1 tablet (2.5 mg total) by mouth 2 (two) times daily with a meal.   pantoprazole 40 MG tablet Commonly known as:  PROTONIX Take 1 tablet (40 mg total) by mouth daily.   sucralfate 1 g tablet Commonly known as:  CARAFATE Take 1 tablet (1 g total) by mouth 4 (four) times daily.        DISCHARGE INSTRUCTIONS:  1. F/u  with PCP in 1-2 weeks 2. F/u in cardiology clinic today to have outpatient holter placed 3. Started on midodrine DIET:  Regular diet DISCHARGE CONDITION:  Stable ACTIVITY:  Activity as tolerated OXYGEN:  Home Oxygen: No.  Oxygen Delivery: room air DISCHARGE LOCATION:  home   If you experience worsening of your admission symptoms, develop shortness of breath, life threatening emergency,  suicidal or homicidal thoughts you must seek medical attention immediately by calling 911 or calling your MD immediately  if symptoms less severe.  You Must read complete instructions/literature along with all the possible adverse reactions/side effects for all the Medicines you take and that have been prescribed to you. Take any new Medicines after you have completely understood and accpet all the possible adverse reactions/side effects.   Please note  You were cared for by a hospitalist during your hospital stay. If you have any questions about your discharge medications or the care you received while you were in the hospital after you are discharged, you can call the unit and asked to speak with the hospitalist on call if the hospitalist that took care of you is not available. Once you are discharged, your primary care physician will handle any further medical issues. Please note that NO REFILLS for any discharge medications will be authorized once you are discharged, as it is imperative that you return to your primary care physician (or establish a relationship with a primary care physician if you do not have one) for your aftercare needs so that they can reassess your need for medications and monitor your lab values.    On the day of Discharge:  VITAL SIGNS:  Blood pressure 116/66, pulse (!) 37, temperature 98.1 F (36.7 C), temperature source Oral, resp. rate 18, height 5\' 11"  (1.803 m), weight 120 kg, SpO2 98 %. PHYSICAL EXAMINATION:  GENERAL:  32 y.o.-year-old patient lying in the bed with no acute distress.  EYES: Pupils equal, round, reactive to light and accommodation. No scleral icterus. Extraocular muscles intact.  HEENT: Head atraumatic, normocephalic. Oropharynx and nasopharynx clear.  NECK:  Supple, no jugular venous distention. No thyroid enlargement, no tenderness.  LUNGS: Normal breath sounds bilaterally, no wheezing, rales,rhonchi or crepitation. No use of accessory muscles of  respiration.  CARDIOVASCULAR: bradycardic, regular rhythm, S1, S2 normal. No murmurs, rubs, or gallops.  ABDOMEN: Soft, non-tender, non-distended. Bowel sounds present. No organomegaly or mass.  EXTREMITIES: No pedal edema, cyanosis, or clubbing.  NEUROLOGIC: Cranial nerves II through XII are intact. Muscle strength 5/5 in all extremities. Sensation intact. Gait not checked.  PSYCHIATRIC: The patient is alert and oriented x 3.  SKIN: No obvious rash, lesion, or ulcer.  DATA REVIEW:   CBC Recent Labs  Lab 05/02/18 0413  WBC 8.7  HGB 14.0  HCT 39.9*  PLT 197    Chemistries  Recent Labs  Lab 04/29/18 0113  05/02/18 0413  NA 140   < > 139  K 3.8   < > 3.8  CL 109   < > 109  CO2 24   < > 25  GLUCOSE 125*   < > 90  BUN 16   < > 14  CREATININE 1.28*   < > 1.04  CALCIUM 8.8*   < > 8.4*  MG 2.4  --   --   AST 34  --   --   ALT 41  --   --   ALKPHOS 68  --   --   BILITOT 0.7  --   --    < > =  values in this interval not displayed.     Microbiology Results  Results for orders placed or performed in visit on 04/10/18  Microscopic Examination     Status: Abnormal   Collection Time: 04/10/18 10:43 AM  Result Value Ref Range Status   WBC, UA 6-10 (A) 0 - 5 /hpf Final   RBC, UA None seen 0 - 2 /hpf Final   Epithelial Cells (non renal) None seen 0 - 10 /hpf Final   Casts Present (A) None seen /lpf Final   Cast Type Hyaline casts N/A Final   Mucus, UA Present (A) Not Estab. Final   Bacteria, UA Many (A) None seen/Few Final    RADIOLOGY:  No results found.   Management plans discussed with the patient, family and they are in agreement.  CODE STATUS: Full Code   TOTAL TIME TAKING CARE OF THIS PATIENT: 33 minutes.    Jinny Blossom Jaxxen Voong M.D on 05/02/2018 at 6:53 PM  Between 7am to 6pm - Pager - [phone]  After 6pm go to [email or web address] - Social research officer, government  [facility] [place] [facility]  Office  [phone]  CC: Primary care physician; Patient, No Pcp  Per   Note: This dictation was prepared with Dragon dictation along with smaller phrase technology. Any transcriptional errors that result from this process are unintentional.

## 2018-05-02 NOTE — Plan of Care (Signed)
  Problem: Activity: Goal: Risk for activity intolerance will decrease Outcome: Progressing   Problem: Safety: Goal: Ability to remain free from injury will improve Outcome: Progressing   Pt receiving IV fluids, no complaints of pain

## 2018-05-02 NOTE — Care Management Note (Signed)
Case Management Note  Patient Details  Name: Corey [name] MRN: [id number] Date of Birth: 1985-12-31  Subjective/Objective:      Independent in all adls, denies issues accessing medical care, obtaining medications or with transportation.  Does not have PCP.  He States he will call insurance company and they will email him a list of in-network providers.  No discharge needs identified at present by care manager or members of care team.  He is discharging and going straight to see cardiology for halter monitor.                Action/Plan:   Expected Discharge Date:  05/02/18               Expected Discharge Plan:  Home/Self Care  In-House Referral:     Discharge planning Services     Post Acute Care Choice:    Choice offered to:     DME Arranged:    DME Agency:     HH Arranged:    HH Agency:     Status of Service:  Completed, signed off  If discussed at [employer] of Stay Meetings, dates discussed:    Additional Comments:  Corey Kerns, RN 05/02/2018, 1:25 PM

## 2018-05-02 NOTE — Progress Notes (Signed)
Patient Name: Corey [name] Date of Encounter: 05/02/2018  Hospital Problem List     Active Problems:   Syncope and collapse   Hypotension    Patient Profile     32 year old male with resting bradycardia and recurrent admissions for dizziness.  Had been on clonidine, Klonopin.  These have been discontinued.  Remains bradycardic at rest with increased heart rate with activity.  Remains relatively hypotensive.  Had been hydrated and is approximately 2 L plus since admission.  Will discontinue IV fluids and placed on ProAmatine at 2.5 mg twice daily to see if this will improve his orthostatic hypotension.  Subjective   Still short of breath with weakness and dizziness.  Inpatient Medications    . divalproex  500 mg Oral Daily  . enoxaparin (LOVENOX) injection  40 mg Subcutaneous Q24H  . FLUoxetine  40 mg Oral Daily  . pantoprazole  40 mg Oral Daily  . sodium chloride flush  3 mL Intravenous Q12H  . sucralfate  1 g Oral QID    Vital Signs    Vitals:   05/01/18 1937 05/01/18 2055 05/02/18 0408 05/02/18 0456  BP: 130/86  116/66   Pulse: (!) 45 (!) 44 (!) 37   Resp: 18  18   Temp: 98.7 F (37.1 C)  98.1 F (36.7 C)   TempSrc:   Oral   SpO2: 99% 99% 98%   Weight:    120 kg  Height:        Intake/Output Summary (Last 24 hours) at 05/02/2018 0758 Last data filed at 05/02/2018 0356 Gross per 24 hour  Intake 2802.98 ml  Output 775 ml  Net 2027.98 ml   Filed Weights   04/30/18 0412 05/01/18 0424 05/02/18 0456  Weight: 118.7 kg 118.2 kg 120 kg    Physical Exam    GEN: Well nourished, well developed, in no acute distress.  HEENT: normal.  Neck: Supple, no JVD, carotid bruits, or masses. Cardiac:  bradycardia, no murmurs, rubs, or gallops. No clubbing, cyanosis, edema.  Radials/DP/PT 2+ and equal bilaterally.  Respiratory:  Respirations regular and unlabored, clear to auscultation bilaterally. GI: Soft, nontender, nondistended, BS + x 4. MS: no deformity or  atrophy. Skin: warm and dry, no rash. Neuro:  Strength and sensation are intact. Psych: Normal affect.  Labs    CBC Recent Labs    05/02/18 0413  WBC 8.7  HGB 14.0  HCT 39.9*  MCV 86.8  PLT 197   Basic Metabolic Panel Recent Labs    72/53/66 1018 04/30/18 1012 05/02/18 0413  NA  --  140 139  K  --  3.6 3.8  CL  --  106 109  CO2  --  26 25  GLUCOSE  --  103* 90  BUN  --  14 14  CREATININE  --  0.99 1.04  CALCIUM  --  8.7* 8.4*  PHOS 4.0  --   --    Liver Function Tests No results for input(s): AST, ALT, ALKPHOS, BILITOT, PROT, ALBUMIN in the last 72 hours. No results for input(s): LIPASE, AMYLASE in the last 72 hours. Cardiac Enzymes No results for input(s): CKTOTAL, CKMB, CKMBINDEX, TROPONINI in the last 72 hours. BNP No results for input(s): BNP in the last 72 hours. D-Dimer No results for input(s): DDIMER in the last 72 hours. Hemoglobin A1C No results for input(s): HGBA1C in the last 72 hours. Fasting Lipid Panel No results for input(s): CHOL, HDL, LDLCALC, TRIG, CHOLHDL, LDLDIRECT in the last  72 hours. Thyroid Function Tests No results for input(s): TSH, T4TOTAL, T3FREE, THYROIDAB in the last 72 hours.  Invalid input(s): FREET3  Telemetry    Sinus bradycardia at rest with no heart block.  ECG    Sinus bradycardia  Radiology    Ct Head Wo Contrast  Result Date: 04/29/2018 CLINICAL DATA:  32 year old male with near syncope. EXAM: CT HEAD WITHOUT CONTRAST TECHNIQUE: Contiguous axial images were obtained from the base of the skull through the vertex without intravenous contrast. COMPARISON:  Head CT dated 08/10/2010 FINDINGS: Brain: No evidence of acute infarction, hemorrhage, hydrocephalus, extra-axial collection or mass lesion/mass effect. Vascular: No hyperdense vessel or unexpected calcification. Skull: Normal. Negative for fracture or focal lesion. Sinuses/Orbits: Diffuse mucoperiosteal thickening of paranasal sinuses. No air-fluid levels. The  mastoid air cells are clear. Other: None IMPRESSION: 1. Normal noncontrast CT of the brain. 2. Mild paranasal sinus disease. Electronically Signed   By: Elgie Collard M.D.   On: 04/29/2018 02:51    Assessment & Plan    Bradycardia-not currently on any AV nodal blocking agents.  Has resting bradycardia however heart rate increases with activity.  Has no high-grade heart block.  Does not appear to be a candidate for permanent pacemaker.  Would continue to avoid AV nodal effective agents.  Follow resting and rate with activity.  Hypertension-has been hydrated well.  Will discontinue IV fluids and try midodrine at 2.5 mg twice daily and follow symptoms.  Ambulate today and if stable consider discharge with outpatient follow-up with Dr. Darrold Junker.  At time of discharge would recommend patient present to [facility] clinic and have a Holter monitor placed.  Signed, Darlin Priestly Lumi Winslett MD 05/02/2018, 7:58 AM  Pager: [phone]

## 2018-05-02 NOTE — Discharge Instructions (Signed)
It was so nice to meet you during this hospitalization!  You came into the hospital because you were having low blood pressures and a low heart rate. The cardiologist's think you need to wear a heart monitor. Please go directly to the cardiologist's office to have the heart monitor placed.  The cardiologists have also started a medication called midodrine to help keep your blood pressure up. Please take 1 tablet twice a day.  Please STOP taking the flomax and the hydrochlorothiazide because these may be contributing to your symptoms.  -Dr. Nancy Marus

## 2018-05-02 NOTE — Progress Notes (Signed)
Pt complaining of some shortness of breath, pt has IV fluids infusing at 226ml/hr. MD paged, Dr. Caryn Bee wants to decrease rate to 180ml/hr. Lungs clear currently, site not infiltrated, will continue to monitor. Shirley Friar, RN, BSN

## 2018-05-04 ENCOUNTER — Other Ambulatory Visit: Payer: Self-pay

## 2018-05-04 ENCOUNTER — Inpatient Hospital Stay
Admission: EM | Admit: 2018-05-04 | Discharge: 2018-05-06 | DRG: 316 | Disposition: A | Discharge status: Home / Self Care | Payer: BLUE CROSS/BLUE SHIELD | Attending: Internal Medicine | Admitting: Internal Medicine

## 2018-05-04 DIAGNOSIS — F315 Bipolar disorder, current episode depressed, severe, with psychotic features: Secondary | ICD-10-CM | POA: Diagnosis present

## 2018-05-04 DIAGNOSIS — Z882 Allergy status to sulfonamides status: Secondary | ICD-10-CM

## 2018-05-04 DIAGNOSIS — R42 Dizziness and giddiness: Secondary | ICD-10-CM

## 2018-05-04 DIAGNOSIS — Z79899 Other long term (current) drug therapy: Secondary | ICD-10-CM

## 2018-05-04 DIAGNOSIS — I9589 Other hypotension: Secondary | ICD-10-CM | POA: Diagnosis not present

## 2018-05-04 DIAGNOSIS — R001 Bradycardia, unspecified: Secondary | ICD-10-CM | POA: Diagnosis present

## 2018-05-04 DIAGNOSIS — Z880 Allergy status to penicillin: Secondary | ICD-10-CM

## 2018-05-04 DIAGNOSIS — I10 Essential (primary) hypertension: Secondary | ICD-10-CM | POA: Diagnosis present

## 2018-05-04 DIAGNOSIS — F419 Anxiety disorder, unspecified: Secondary | ICD-10-CM | POA: Diagnosis present

## 2018-05-04 DIAGNOSIS — Z87891 Personal history of nicotine dependence: Secondary | ICD-10-CM

## 2018-05-04 DIAGNOSIS — F329 Major depressive disorder, single episode, unspecified: Secondary | ICD-10-CM | POA: Diagnosis present

## 2018-05-04 DIAGNOSIS — E669 Obesity, unspecified: Secondary | ICD-10-CM | POA: Diagnosis present

## 2018-05-04 DIAGNOSIS — R55 Syncope and collapse: Secondary | ICD-10-CM

## 2018-05-04 LAB — CBC
HCT: 44.3 % (ref 40.0–52.0)
Hemoglobin: 15.6 g/dL (ref 13.0–18.0)
MCH: 30.2 pg (ref 26.0–34.0)
MCHC: 35.3 g/dL (ref 32.0–36.0)
MCV: 85.6 fL (ref 80.0–100.0)
Platelets: 241 10*3/uL (ref 150–440)
RBC: 5.18 MIL/uL (ref 4.40–5.90)
RDW: 13.5 % (ref 11.5–14.5)
WBC: 7.3 10*3/uL (ref 3.8–10.6)

## 2018-05-04 LAB — BASIC METABOLIC PANEL
Anion gap: 9 (ref 5–15)
BUN: 16 mg/dL (ref 6–20)
CHLORIDE: 110 mmol/L (ref 98–111)
CO2: 22 mmol/L (ref 22–32)
Calcium: 9 mg/dL (ref 8.9–10.3)
Creatinine, Ser: 1 mg/dL (ref 0.61–1.24)
GFR calc non Af Amer: >60 mL/min (ref >60)
Glucose, Bld: 94 mg/dL (ref 70–99)
POTASSIUM: 3.8 mmol/L (ref 3.5–5.1)
SODIUM: 141 mmol/L (ref 135–145)

## 2018-05-04 LAB — TROPONIN I: Troponin I: <0.03 ng/mL (ref <0.03)

## 2018-05-04 MED ORDER — SUCRALFATE 1 G PO TABS
1.0000 g | ORAL_TABLET | Freq: Four times a day (QID) | ORAL | Status: DC
  Administered 2018-05-05 (×4): 1 g via ORAL
  Filled 2018-05-04 (×5): qty 1

## 2018-05-04 MED ORDER — FLUOXETINE HCL 20 MG PO CAPS
40.0000 mg | ORAL_CAPSULE | Freq: Every day | ORAL | Status: DC
  Administered 2018-05-05 – 2018-05-06 (×2): 40 mg via ORAL
  Filled 2018-05-04 (×2): qty 2

## 2018-05-04 MED ORDER — DIVALPROEX SODIUM ER 250 MG PO TB24
500.0000 mg | ORAL_TABLET | Freq: Every day | ORAL | Status: DC
  Administered 2018-05-05 – 2018-05-06 (×2): 500 mg via ORAL
  Filled 2018-05-04 (×2): qty 2

## 2018-05-04 MED ORDER — SODIUM CHLORIDE 0.9 % IV BOLUS
1000.0000 mL | Freq: Once | INTRAVENOUS | Status: AC
  Administered 2018-05-04: 1000 mL via INTRAVENOUS

## 2018-05-04 MED ORDER — ENOXAPARIN SODIUM 40 MG/0.4ML ~~LOC~~ SOLN: 40.0000 mg | SUBCUTANEOUS | Status: DC

## 2018-05-04 MED ORDER — SODIUM CHLORIDE 0.9% FLUSH
3.0000 mL | Freq: Two times a day (BID) | INTRAVENOUS | Status: DC
  Administered 2018-05-05 (×2): 3 mL via INTRAVENOUS

## 2018-05-04 MED ORDER — MIDODRINE HCL 5 MG PO TABS
2.5000 mg | ORAL_TABLET | Freq: Two times a day (BID) | ORAL | Status: DC
  Administered 2018-05-05 – 2018-05-06 (×3): 2.5 mg via ORAL
  Filled 2018-05-04 (×3): qty 1

## 2018-05-04 MED ORDER — MECLIZINE HCL 25 MG PO TABS
25.0000 mg | ORAL_TABLET | Freq: Once | ORAL | Status: AC
  Administered 2018-05-04: 25 mg via ORAL
  Filled 2018-05-04: qty 1

## 2018-05-04 MED ORDER — SODIUM CHLORIDE 0.9 % IV SOLN
INTRAVENOUS | Status: DC
  Administered 2018-05-05 – 2018-05-06 (×3): via INTRAVENOUS

## 2018-05-04 MED ORDER — PANTOPRAZOLE SODIUM 40 MG PO TBEC
40.0000 mg | DELAYED_RELEASE_TABLET | Freq: Every day | ORAL | Status: DC
  Administered 2018-05-05 – 2018-05-06 (×2): 40 mg via ORAL
  Filled 2018-05-04 (×2): qty 1

## 2018-05-04 NOTE — ED Triage Notes (Addendum)
Pt came to Ed via EMS from home. Pt has cardiac history. 2 days ago was switched medications. Pt started taking Midodrine and was given Holter monitor. Last dose of new med was 11am. Shortly after pt started feeling weak and having uncontrollable shaking. Pt reports started on med due to HR being in 30s-40s. Pt HR 60 upon arrival, bp stable.

## 2018-05-04 NOTE — ED Provider Notes (Signed)
[facility] Emergency Department Provider Note   ____________________________________________   I have reviewed the triage vital signs and the nursing notes.   HISTORY  Chief Complaint Weakness   History limited by: Not Limited   HPI Corey [name] is a 32 y.o. male who presents to the emergency department today after an episode of feeling weak and near syncopal.  The patient has been seen and admitted to the hospital for similar symptoms recently.  Had been diagnosed bradycardia.  States that today he was going to the bathroom and felt weak.  He did fall into some furniture.  He denied any chest pain at this time.  Had been wearing a Holter monitor at this time.  States he has started the mid and Dron which was ordered on his last admission.  The patient denies any fevers.    Per medical record review patient has a history of recent admission for similar symptoms.   Past Medical History:  Diagnosis Date  . Bipolar 1 disorder (HCC)   . HTN (hypertension)     Patient Active Problem List   Diagnosis Date Noted  . Syncope and collapse 04/29/2018  . Hypotension 04/29/2018  . Bradycardia 04/24/2018  . 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 Kidney stone 03/26/2018    Past Surgical History:  Procedure Laterality Date  . ESOPHAGOGASTRODUODENOSCOPY (EGD) WITH PROPOFOL N/A 04/26/2018   Procedure: ESOPHAGOGASTRODUODENOSCOPY (EGD) WITH PROPOFOL;  Surgeon: Wyline Mood, MD;  Location: [facility] ENDOSCOPY;  Service: Gastroenterology;  Laterality: N/A;  . KNEE ARTHROSCOPY      Prior to Admission medications   Medication Sig Start Date End Date Taking? Authorizing Provider  divalproex (DEPAKOTE ER) 500 MG 24 hr tablet Take 500 mg by mouth daily. 04/24/18   [provider]  FLUoxetine (PROZAC) 40 MG capsule Take 40 mg by mouth daily. 02/28/18   [provider]  midodrine (PROAMATINE) 2.5 MG tablet Take 1 tablet (2.5 mg total) by mouth 2 (two) times daily  with a meal. 05/02/18   Mayo, Allyn Kenner, MD  pantoprazole (PROTONIX) 40 MG tablet Take 1 tablet (40 mg total) by mouth daily. 03/27/18 05/26/18  Houston Siren, MD  sucralfate (CARAFATE) 1 g tablet Take 1 tablet (1 g total) by mouth 4 (four) times daily. 04/01/18   Sharman Cheek, MD    Allergies Penicillins and Sulfa antibiotics  Family History  Problem Relation Age of Onset  . Kidney cancer Neg Hx   . Bladder Cancer Neg Hx   . Prostate cancer Neg Hx     Social History Social History   Tobacco Use  . Smoking status: Former Games developer  . Smokeless tobacco: Current User    Types: Snuff  Substance Use Topics  . Alcohol use: No  . Drug use: Never    Review of Systems Constitutional: No fever/chills Eyes: No visual changes. ENT: No sore throat. Cardiovascular: Denies chest pain. Respiratory: Denies shortness of breath. Gastrointestinal: No abdominal pain.  No nausea, no vomiting.  No diarrhea.   Genitourinary: Negative for dysuria. Musculoskeletal: Negative for back pain. Skin: Negative for rash. Neurological: Positive for dizziness.   ____________________________________________   PHYSICAL EXAM:  VITAL SIGNS: ED Triage Vitals [05/04/18 1454]  Enc Vitals Group     BP 124/75     Pulse Rate 63     Resp 16     Temp 99 F (37.2 C)     Temp Source Oral     SpO2 95 %     Weight  262 lb 5.6 oz (119 kg)     Height 5\' 11"  (1.803 m)     Head Circumference      Peak Flow      Pain Score 0    Constitutional: Alert and oriented.  Eyes: Conjunctivae are normal.  ENT      Head: Normocephalic and atraumatic.      Nose: No congestion/rhinnorhea.      Mouth/Throat: Mucous membranes are moist.      Neck: No stridor. Hematological/Lymphatic/Immunilogical: No cervical lymphadenopathy. Cardiovascular: Sinus bradycardia, regular rhythm.  No murmurs, rubs, or gallops.  Respiratory: Normal respiratory effort without tachypnea nor retractions. Breath sounds are clear and equal  bilaterally. No wheezes/rales/rhonchi. Gastrointestinal: Soft and non tender. No rebound. No guarding.  Genitourinary: Deferred Musculoskeletal: Normal range of motion in all extremities. No lower extremity edema. Neurologic:  Normal speech and language. No gross focal neurologic deficits are appreciated.  Skin:  Skin is warm, dry and intact. No rash noted. Psychiatric: Mood and affect are normal. Speech and behavior are normal. Patient exhibits appropriate insight and judgment.  ____________________________________________    LABS (pertinent positives/negatives)  Trop <0.03 CBC wbc 7.3, hgb 15.6, plt 241 BMP wnl  ____________________________________________   EKG  I, Phineas Semen, attending physician, personally viewed and interpreted this EKG  EKG Time: 1448 Rate: 60 Rhythm: sinus rhythm Axis: left axis deviation Intervals: qtc 431 QRS: narrow ST changes: no st elevation Impression: abnormal ekg   ____________________________________________    RADIOLOGY  None  ____________________________________________   PROCEDURES  Procedures  ____________________________________________   INITIAL IMPRESSION / ASSESSMENT AND PLAN / ED COURSE  Pertinent labs & imaging results that were available during my care of the patient were reviewed by me and considered in my medical decision making (see chart for details).   Patient presented to the emergency department today because of concerns for continued episodes of dizziness.  Had a recent admission for the same.  It was sent home with a Holter monitor.  Patient's blood work here without any obvious anemia or electrolyte abnormality.  Patient's heart rate was noted to become frequently bradycardic on the monitor.  Patient was given IV fluids however continued to have significant increase in falls upon standing.  Given continued symptoms will plan on readmission.  ____________________________________________   FINAL  CLINICAL IMPRESSION(S) / ED DIAGNOSES  Final diagnoses:  Near syncope  Dizziness     Note: This dictation was prepared with Dragon dictation. Any transcriptional errors that result from this process are unintentional     Phineas Semen, MD 05/04/18 2059

## 2018-05-04 NOTE — H&P (Signed)
[facility] - [place] at [facility]   PATIENT NAME: Corey [name]    MR#:  [id number]  DATE OF BIRTH:  [birth year]  DATE OF ADMISSION:  05/04/2018  PRIMARY CARE PHYSICIAN: Patient, No Pcp Per   REQUESTING/REFERRING PHYSICIAN: Dr. Sharma Covert  CHIEF COMPLAINT:   Chief Complaint  Patient presents with  . Weakness    HISTORY OF PRESENT ILLNESS:  Corey [name]  is a 32 y.o. male who presents with chief complaint as above.  Patient states this afternoon he got out of bed to use the bathroom and on the way back from the bathroom he, "felt weak all over" became dizzy and fell down.  Patient denies loss of consciousness, blurred vision, chest pain and shortness of breath.  Patient complains of an ongoing headache since this morning.  Patient also complains of tremors last 2 days, starting first in hands bilaterally, then more generalized during this most recent syncopal episode.  Patient denies experiencing an aura, and was able to converse during this episode even though talking was described as difficult due to mouth feeling "dry". Patient does describe additional dizzy spells during periods of increased activity with corresponding shortness of breath over the course of the last 2 days.  These episodes were resolved once the patient stopped the activity and rested for 5-10 minutes.  Patient has had a number of recent admissions related to syncopal episodes and is being followed by [facility] with work up continuing from most recent discharge.  Patient discharged 05/02/18 with Holter monitor.  Patient confirms he was wearing the Holter monitor at the time of the episode, denies any feelings of heart palpitations or fluttering in his chest.  Hospitalist was contacted for admission.  PAST MEDICAL HISTORY:   Past Medical History:  Diagnosis Date  . Bipolar 1 disorder (HCC)   . HTN (hypertension)      PAST SURGICAL HISTORY:   Past Surgical History:  Procedure Laterality Date   . ESOPHAGOGASTRODUODENOSCOPY (EGD) WITH PROPOFOL N/A 04/26/2018   Procedure: ESOPHAGOGASTRODUODENOSCOPY (EGD) WITH PROPOFOL;  Surgeon: Wyline Mood, MD;  Location: [facility] ENDOSCOPY;  Service: Gastroenterology;  Laterality: N/A;  . KNEE ARTHROSCOPY       SOCIAL HISTORY:   Social History   Tobacco Use  . Smoking status: Former Games developer  . Smokeless tobacco: Current User    Types: Snuff  Substance Use Topics  . Alcohol use: No     FAMILY HISTORY:   Family History  Problem Relation Age of Onset  . Kidney cancer Neg Hx   . Bladder Cancer Neg Hx   . Prostate cancer Neg Hx      DRUG ALLERGIES:   Allergies  Allergen Reactions  . Penicillins Hives    Has patient had a PCN reaction causing immediate rash, facial/tongue/throat swelling, SOB or lightheadedness with hypotension: Yes Has patient had a PCN reaction causing severe rash involving mucus membranes or skin necrosis: No Has patient had a PCN reaction that required hospitalization: No Has patient had a PCN reaction occurring within the last 10 years: No If all of the above answers are "NO", then may proceed with Cephalosporin use.   Gaetana Michaelis Antibiotics Hives    MEDICATIONS AT HOME:   Prior to Admission medications   Medication Sig Start Date End Date Taking? Authorizing Provider  clonazePAM (KLONOPIN) 1 MG tablet Take 1 mg by mouth as needed for anxiety.  05/02/18  Yes [provider]  divalproex (DEPAKOTE ER) 500 MG 24 hr tablet Take  500 mg by mouth daily. 04/24/18  Yes [provider]  FLUoxetine (PROZAC) 40 MG capsule Take 40 mg by mouth daily. 02/28/18  Yes [provider]  midodrine (PROAMATINE) 2.5 MG tablet Take 1 tablet (2.5 mg total) by mouth 2 (two) times daily with a meal. 05/02/18  Yes Mayo, Allyn Kenner, MD  pantoprazole (PROTONIX) 40 MG tablet Take 1 tablet (40 mg total) by mouth daily. 03/27/18 05/26/18 Yes Sainani, Rolly Pancake, MD  sucralfate (CARAFATE) 1 g tablet Take 1 tablet (1 g total) by  mouth 4 (four) times daily. 04/01/18  Yes Sharman Cheek, MD    REVIEW OF SYSTEMS:  Review of Systems  Constitutional: Negative for chills, fever, malaise/fatigue and weight loss.  HENT: Negative for ear pain, hearing loss and tinnitus.   Eyes: Negative for blurred vision, double vision, pain and redness.  Respiratory: Positive for shortness of breath. Negative for cough and hemoptysis.        Reported during syncopal episode. Pt. Not currently SOB  Cardiovascular: Negative for chest pain, palpitations, orthopnea and leg swelling.  Gastrointestinal: Negative for abdominal pain, constipation, diarrhea, nausea and vomiting.  Genitourinary: Negative for dysuria, frequency and hematuria.  Musculoskeletal: Negative for back pain, joint pain and neck pain.  Skin:       No acne, rash, or lesions  Neurological: Positive for dizziness, tremors, weakness and headaches. Negative for focal weakness.       Reported during syncopal episode  Endo/Heme/Allergies: Negative for polydipsia. Does not bruise/bleed easily.  Psychiatric/Behavioral: Negative for depression. The patient is not nervous/anxious and does not have insomnia.      VITAL SIGNS:   Vitals:   05/04/18 1845 05/04/18 1900 05/04/18 1915 05/04/18 1930  BP:  120/78  124/82  Pulse: (!) 46 60 63 (!) 55  Resp: 15 16 13 13   Temp:      TempSrc:      SpO2: 98% 97% 96% 97%  Weight:      Height:       Wt Readings from Last 3 Encounters:  05/04/18 119 kg  05/02/18 120 kg  04/26/18 120.2 kg    PHYSICAL EXAMINATION:  Physical Exam  Vitals reviewed. Constitutional: He is oriented to person, place, and time. He appears well-developed and well-nourished. No distress.  HENT:  Head: Normocephalic and atraumatic.  Mouth/Throat: Oropharynx is clear and moist.  Eyes: Pupils are equal, round, and reactive to light. Conjunctivae and EOM are normal. No scleral icterus.  Neck: Normal range of motion. Neck supple. No JVD present. No thyromegaly  present.  Cardiovascular: Regular rhythm and intact distal pulses. Exam reveals no gallop and no friction rub.  No murmur heard. SB  Respiratory: Effort normal and breath sounds normal. No respiratory distress. He has no wheezes. He has no rales.  GI: Soft. Bowel sounds are normal. He exhibits no distension. There is no tenderness.  Musculoskeletal: Normal range of motion. He exhibits no edema.  No arthritis, no gout  Lymphadenopathy:    He has no cervical adenopathy.  Neurological: He is alert and oriented to person, place, and time. No cranial nerve deficit.  No dysarthria, no aphasia  Skin: Skin is warm and dry. No rash noted. No erythema.  Psychiatric: He has a normal mood and affect. His behavior is normal. Judgment and thought content normal.    LABORATORY PANEL:   CBC Recent Labs  Lab 05/04/18 1459  WBC 7.3  HGB 15.6  HCT 44.3  PLT 241   ------------------------------------------------------------------------------------------------------------------  Chemistries  Recent Labs  Lab 04/29/18 0113  05/04/18 1459  NA 140   < > 141  K 3.8   < > 3.8  CL 109   < > 110  CO2 24   < > 22  GLUCOSE 125*   < > 94  BUN 16   < > 16  CREATININE 1.28*   < > 1.00  CALCIUM 8.8*   < > 9.0  MG 2.4  --   --   AST 34  --   --   ALT 41  --   --   ALKPHOS 68  --   --   BILITOT 0.7  --   --    < > = values in this interval not displayed.   ------------------------------------------------------------------------------------------------------------------  Cardiac Enzymes Recent Labs  Lab 05/04/18 1459  TROPONINI <0.03   ------------------------------------------------------------------------------------------------------------------  RADIOLOGY:  No results found.  EKG:   Orders placed or performed during the hospital encounter of 05/04/18  . EKG 12-Lead  . EKG 12-Lead  . ED EKG within 10 minutes  . ED EKG within 10 minutes    IMPRESSION AND PLAN:  Principal  Problem:   Syncope and collapse- Patient brought Holter monitor to be sent off for interrogation.  Patient admitted to Telemetry unit with continuous cardiac monitoring.  Cardiology consult ordered, midodrine medication continued.  Active Problems:   Bradycardia- BP remains stable on midodrine medication, continuous cardiac monitoring and cardiology consult ordered to monitor.   Bipolar 1 disorder (HCC)- home medications continued.   Chart review performed and case discussed with ED provider. Labs, imaging and/or ECG reviewed by provider and discussed with patient/family. Management plans discussed with the patient and/or family.  DVT PROPHYLAXIS: SubQ lovenox   GI PROPHYLAXIS:  PPI- continue home protonix prescription  ADMISSION STATUS: Inpatient     CODE STATUS:  Code Status History    Date Active Date Inactive Code Status Order ID Comments User Context   04/29/2018 0954 05/02/2018 1907 Full Code [id number]  Barbaraann Rondo, MD Inpatient   04/24/2018 0646 04/26/2018 2146 Full Code [id number]  Arnaldo Natal, MD Inpatient   03/26/2018 1507 03/27/2018 1748 Full Code [id number]  Auburn Bilberry, MD Inpatient      TOTAL TIME TAKING CARE OF THIS PATIENT: 40 minutes.   Jonahtan Manseau FIELDING 05/04/2018, 10:52 PM  [facility]  Office  [phone]  CC: Primary care physician; Patient, No Pcp Per  Note:  This document was prepared using Dragon voice recognition software and may include unintentional dictation errors.

## 2018-05-05 LAB — BASIC METABOLIC PANEL
ANION GAP: 6 (ref 5–15)
BUN: 14 mg/dL (ref 6–20)
CALCIUM: 8.3 mg/dL — AB (ref 8.9–10.3)
CO2: 25 mmol/L (ref 22–32)
Chloride: 111 mmol/L (ref 98–111)
Creatinine, Ser: 0.96 mg/dL (ref 0.61–1.24)
GFR calc Af Amer: >60 mL/min (ref >60)
Glucose, Bld: 92 mg/dL (ref 70–99)
POTASSIUM: 3.6 mmol/L (ref 3.5–5.1)
Sodium: 142 mmol/L (ref 135–145)

## 2018-05-05 LAB — CBC
HEMATOCRIT: 41 % (ref 40.0–52.0)
HEMOGLOBIN: 14.5 g/dL (ref 13.0–18.0)
MCH: 31 pg (ref 26.0–34.0)
MCHC: 35.3 g/dL (ref 32.0–36.0)
MCV: 87.7 fL (ref 80.0–100.0)
Platelets: 208 10*3/uL (ref 150–440)
RBC: 4.68 MIL/uL (ref 4.40–5.90)
RDW: 13.8 % (ref 11.5–14.5)
WBC: 6.8 10*3/uL (ref 3.8–10.6)

## 2018-05-05 NOTE — Progress Notes (Signed)
[facility] - [place] at [facility]   PATIENT NAME: Corey [name]    MR#:  [id number]  DATE OF BIRTH:  05/12/1986  SUBJECTIVE: Admitted for syncope, bradycardia.  Admitted multiple times the same problem recently.lab work is unremarkable.  CHIEF COMPLAINT:   Chief Complaint  Patient presents with  . Weakness    REVIEW OF SYSTEMS:    Review of Systems  Constitutional: Negative for chills and fever.  HENT: Negative for hearing loss.   Eyes: Negative for blurred vision, double vision and photophobia.  Respiratory: Negative for cough, hemoptysis and shortness of breath.   Cardiovascular: Negative for palpitations, orthopnea and leg swelling.  Gastrointestinal: Negative for abdominal pain, diarrhea and vomiting.  Genitourinary: Negative for dysuria and urgency.  Musculoskeletal: Negative for myalgias and neck pain.  Skin: Negative for rash.  Neurological: Negative for dizziness, focal weakness, seizures, weakness and headaches.  Psychiatric/Behavioral: Negative for memory loss. The patient does not have insomnia.     Nutrition:  Tolerating Diet: Tolerating PT:      DRUG ALLERGIES:   Allergies  Allergen Reactions  . Penicillins Hives    Has patient had a PCN reaction causing immediate rash, facial/tongue/throat swelling, SOB or lightheadedness with hypotension: Yes Has patient had a PCN reaction causing severe rash involving mucus membranes or skin necrosis: No Has patient had a PCN reaction that required hospitalization: No Has patient had a PCN reaction occurring within the last 10 years: No If all of the above answers are "NO", then may proceed with Cephalosporin use.   . Sulfa Antibiotics Hives    VITALS:  Blood pressure 114/71, pulse (!) 56, temperature 98.7 F (37.1 C), temperature source Oral, resp. rate 16, height 5\' 11"  (1.803 m), weight 116.5 kg, SpO2 95 %.  PHYSICAL EXAMINATION:   Physical Exam  GENERAL:  32 y.o.-year-old patient  lying in the bed with no acute distress.  EYES: Pupils equal, round, reactive to light and accommodation. No scleral icterus. Extraocular muscles intact.  HEENT: Head atraumatic, normocephalic. Oropharynx and nasopharynx clear.  NECK:  Supple, no jugular venous distention. No thyroid enlargement, no tenderness.  LUNGS: Normal breath sounds bilaterally, no wheezing, rales,rhonchi or crepitation. No use of accessory muscles of respiration.  CARDIOVASCULAR: S1, S2 normal. No murmurs, rubs, or gallops.  ABDOMEN: Soft, nontender, nondistended. Bowel sounds present. No organomegaly or mass.  EXTREMITIES: No pedal edema, cyanosis, or clubbing.  NEUROLOGIC: Cranial nerves II through XII are intact. Muscle strength 5/5 in all extremities. Sensation intact. Gait not checked.  PSYCHIATRIC: The patient is alert and oriented x 3.  SKIN: No obvious rash, lesion, or ulcer.    LABORATORY PANEL:   CBC Recent Labs  Lab 05/05/18 0551  WBC 6.8  HGB 14.5  HCT 41.0  PLT 208   ------------------------------------------------------------------------------------------------------------------  Chemistries  Recent Labs  Lab 04/29/18 0113  05/05/18 0551  NA 140   < > 142  K 3.8   < > 3.6  CL 109   < > 111  CO2 24   < > 25  GLUCOSE 125*   < > 92  BUN 16   < > 14  CREATININE 1.28*   < > 0.96  CALCIUM 8.8*   < > 8.3*  MG 2.4  --   --   AST 34  --   --   ALT 41  --   --   ALKPHOS 68  --   --   BILITOT 0.7  --   --    < > =  values in this interval not displayed.   ------------------------------------------------------------------------------------------------------------------  Cardiac Enzymes Recent Labs  Lab 05/04/18 1459  TROPONINI <0.03   ------------------------------------------------------------------------------------------------------------------  RADIOLOGY:  No results found.   ASSESSMENT AND PLAN:   Principal Problem:   Syncope and collapse Active Problems:   Bradycardia    Bipolar 1 disorder (HCC)  #1 ,recurrent syncope, admitted multiple times recently,had 48 hr holter,waitingfor results.seen by cardiology,Dr.calwood said he will call us with result. likley d/c am H/o depression on Prozac,depakote  Chronic hypotension and recurrent admissions for syncope;on midodine Still has bradycardia with HR as low as 44,monitor on tele,follow with cardio regarding Holter,possible d/c am     All the records are reviewed and case discussed with Care Management/Social Workerr. Management plans discussed with the patient, family and they are in agreement.  CODE STATUS:full  TOTAL TIME TAKING CARE OF THIS PATIENT:31 minutes.   POSSIBLE D/C IN 1 DAY, DEPENDING ON CLINICAL CONDITION.   Katha Hamming M.D on 05/05/2018 at 8:47 PM  Between 7am to 6pm - Pager - [phone]  After 6pm go to [email or web address] - password [email or web address]  [facility]  Office  [phone]  CC: Primary care physician; Patient, No Pcp Per

## 2018-05-05 NOTE — Plan of Care (Signed)
  Problem: Pain Managment: Goal: General experience of comfort will improve Outcome: Progressing Note:  No complaints of pain this shift   Problem: Safety: Goal: Ability to remain free from injury will improve Outcome: Progressing Note:  Up independently in room, tolerating well   Problem: Skin Integrity: Goal: Risk for impaired skin integrity will decrease Outcome: Progressing   Problem: Education: Goal: Knowledge of General Education information will improve Description Including pain rating scale, medication(s)/side effects and non-pharmacologic comfort measures Outcome: Completed/Met

## 2018-05-05 NOTE — Care Management Note (Signed)
Case Management Note  Patient Details  Name: Corey [name] MRN: [id number] Date of Birth: 08/16/1985  Subjective/Objective:         Patient from home with syncopal episode.  Multiple admissions in the last few weeks.  Bradycardiac in the 30's.  Dr. Juliann Pares to consult.  From home, independent.  Has had a halter monitor since earlier this week.  Was recently discharged on 10-1.        Action/Plan:   Expected Discharge Date:                  Expected Discharge Plan:     In-House Referral:     Discharge planning Services     Post Acute Care Choice:    Choice offered to:     DME Arranged:    DME Agency:     HH Arranged:    HH Agency:     Status of Service:  In process, will continue to follow  If discussed at Long Length of Stay Meetings, dates discussed:    Additional Comments:  Sherren Kerns, RN 05/05/2018, 12:31 PM

## 2018-05-05 NOTE — Consult Note (Signed)
Reason for Consult: Vertigo bradycardia near syncope Referring Physician: [facility] hospitalist Dr. Saralyn Pilar cardiologist  Corey [name] is an 32 y.o. male.  HPI: Patient is a 32 year old male history of bipolar on multiple psychiatric medications including medications for anxiety.  Patient has had multiple bouts of near syncope vertigo lightheaded dizziness as well as bradycardia he has had multiple medical contacts now he is in hospital on telemetry because of potential symptomatic bradycardia.  No significant chest pain mostly near syncope and vertigo now here for follow-up assessment evaluation.  Patient is wearing a 48-hour Holter at the time of his last.  Episode but patient feels better now now here for further cardiac assessment  Past Medical History:  Diagnosis Date  . Bipolar 1 disorder ([place])   . HTN (hypertension)     Past Surgical History:  Procedure Laterality Date  . ESOPHAGOGASTRODUODENOSCOPY (EGD) WITH PROPOFOL N/A 04/26/2018   Procedure: ESOPHAGOGASTRODUODENOSCOPY (EGD) WITH PROPOFOL;  Surgeon: Jonathon Bellows, MD;  Location: [facility] ENDOSCOPY;  Service: Gastroenterology;  Laterality: N/A;  . KNEE ARTHROSCOPY      Family History  Problem Relation Age of Onset  . Kidney cancer Neg Hx   . Bladder Cancer Neg Hx   . Prostate cancer Neg Hx     Social History:  reports that he has quit smoking. His smokeless tobacco use includes snuff. He reports that he does not drink alcohol or use drugs.  Allergies:  Allergies  Allergen Reactions  . Penicillins Hives    Has patient had a PCN reaction causing immediate rash, facial/tongue/throat swelling, SOB or lightheadedness with hypotension: Yes Has patient had a PCN reaction causing severe rash involving mucus membranes or skin necrosis: No Has patient had a PCN reaction that required hospitalization: No Has patient had a PCN reaction occurring within the last 10 years: No If all of the above answers are "NO", then may proceed with  Cephalosporin use.   . Sulfa Antibiotics Hives    Medications: I have reviewed the patient's current medications.  Results for orders placed or performed during the hospital encounter of 05/04/18 (from the past 48 hour(s))  Basic metabolic panel     Status: None   Collection Time: 05/04/18  2:59 PM  Result Value Ref Range   Sodium 141 135 - 145 mmol/L   Potassium 3.8 3.5 - 5.1 mmol/L   Chloride 110 98 - 111 mmol/L   CO2 22 22 - 32 mmol/L   Glucose, Bld 94 70 - 99 mg/dL   BUN 16 6 - 20 mg/dL   Creatinine, Ser 1.00 0.61 - 1.24 mg/dL   Calcium 9.0 8.9 - 10.3 mg/dL   GFR calc non Af Amer >60 >60 mL/min   GFR calc Af Amer >60 >60 mL/min    Comment: (NOTE) The eGFR has been calculated using the CKD EPI equation. This calculation has not been validated in all clinical situations. eGFR's persistently <60 mL/min signify possible Chronic Kidney Disease.    Anion gap 9 5 - 15    Comment: Performed at [facility], [address] [place], [place] [address]  CBC     Status: None   Collection Time: 05/04/18  2:59 PM  Result Value Ref Range   WBC 7.3 3.8 - 10.6 K/uL   RBC 5.18 4.40 - 5.90 MIL/uL   Hemoglobin 15.6 13.0 - 18.0 g/dL   HCT 44.3 40.0 - 52.0 %   MCV 85.6 80.0 - 100.0 fL   MCH 30.2 26.0 - 34.0 pg  MCHC 35.3 32.0 - 36.0 g/dL   RDW 13.5 11.5 - 14.5 %   Platelets 241 150 - 440 K/uL    Comment: Performed at [facility], [address] [place], [place] [address]  Troponin I     Status: None   Collection Time: 05/04/18  2:59 PM  Result Value Ref Range   Troponin I <0.03 <0.03 ng/mL    Comment: Performed at [facility] [address] [place], [place] [address]  Basic metabolic panel     Status: Abnormal   Collection Time: 05/05/18  5:51 AM  Result Value Ref Range   Sodium 142 135 - 145 mmol/L   Potassium 3.6 3.5 - 5.1 mmol/L   Chloride 111 98 - 111 mmol/L   CO2 25 22 - 32 mmol/L   Glucose, Bld 92 70 - 99 mg/dL   BUN 14 6 - 20 mg/dL    Creatinine, Ser 0.96 0.61 - 1.24 mg/dL   Calcium 8.3 (L) 8.9 - 10.3 mg/dL   GFR calc non Af Amer >60 >60 mL/min   GFR calc Af Amer >60 >60 mL/min    Comment: (NOTE) The eGFR has been calculated using the CKD EPI equation. This calculation has not been validated in all clinical situations. eGFR's persistently <60 mL/min signify possible Chronic Kidney Disease.    Anion gap 6 5 - 15    Comment: Performed at [facility], [address] [place], [place] [address]  CBC     Status: None   Collection Time: 05/05/18  5:51 AM  Result Value Ref Range   WBC 6.8 3.8 - 10.6 K/uL   RBC 4.68 4.40 - 5.90 MIL/uL   Hemoglobin 14.5 13.0 - 18.0 g/dL   HCT 41.0 40.0 - 52.0 %   MCV 87.7 80.0 - 100.0 fL   MCH 31.0 26.0 - 34.0 pg   MCHC 35.3 32.0 - 36.0 g/dL   RDW 13.8 11.5 - 14.5 %   Platelets 208 150 - 440 K/uL    Comment: Performed at [facility], [address] [place], [place] [address]    No results found.  Review of Systems  Constitutional: Positive for diaphoresis and malaise/fatigue.  HENT: Positive for congestion.   Eyes: Negative.   Respiratory: Positive for shortness of breath.   Cardiovascular: Positive for palpitations.  Gastrointestinal: Negative.   Genitourinary: Negative.   Musculoskeletal: Negative.   Skin: Negative.   Neurological: Positive for dizziness and loss of consciousness.  Endo/Heme/Allergies: Negative.   Psychiatric/Behavioral: The patient is nervous/anxious.    Blood pressure 120/88, pulse (!) 50, temperature 97.7 F (36.5 C), temperature source Oral, resp. rate 18, height '5\' 11"'  (1.803 m), weight 116.5 kg, SpO2 99 %. Physical Exam  Nursing note and vitals reviewed. Constitutional: He is oriented to person, place, and time. He appears well-developed and well-nourished.  HENT:  Head: Atraumatic.  Eyes: Pupils are equal, round, and reactive to light. Conjunctivae and EOM are normal.  Neck: Normal range of motion. Neck supple.   Cardiovascular: Normal rate, regular rhythm and normal heart sounds.  Respiratory: Effort normal and breath sounds normal.  GI: Soft. Bowel sounds are normal.  Musculoskeletal: Normal range of motion.  Neurological: He is alert and oriented to person, place, and time. He has normal reflexes.  Skin: Skin is warm and dry.  Psychiatric: He has a normal mood and affect.    Assessment/Plan: Syncope Bradycardia Bipolar Vertigo . Plan Agree with telemetry Review Holter monitor Indication for permanent  pacemaker Remain conservative medical therapy Recommend outpatient functional study and echocardiogram Patient follow-up with cardiology Monday or Tuesday as outpatient Able to discharge home today  Dwayne D Callwood 05/05/2018, 4:01 PM

## 2018-05-06 NOTE — Progress Notes (Signed)
Discharge instructions explained to pt/ verbalized an understanding/ iv and tele removed/ f/u with cardio on Monday/ transported off unit via wheelchair.

## 2018-05-06 NOTE — Discharge Instructions (Signed)
Follow up Dr. Darrold Junker

## 2018-05-06 NOTE — Discharge Summary (Signed)
[facility] [place] [facility]   PATIENT NAME: Corey [name]    MR#:  [id number]  DATE OF BIRTH:  03-Jun-1986  DATE OF ADMISSION:  05/04/2018   ADMITTING PHYSICIAN: Arnaldo Natal, MD  DATE OF DISCHARGE: 05/06/2018 PRIMARY CARE PHYSICIAN: Patient, No Pcp Per   ADMISSION DIAGNOSIS:  Dizziness [R42] Near syncope [R55] DISCHARGE DIAGNOSIS:  Principal Problem:   Syncope and collapse Active Problems:   Bradycardia   Bipolar 1 disorder (HCC)  SECONDARY DIAGNOSIS:   Past Medical History:  Diagnosis Date  . Bipolar 1 disorder (HCC)   . HTN (hypertension)    HOSPITAL COURSE:   #1 ,recurrent syncope, admitted multiple times recently,had 48 hr holter. Recommend outpatient functional study and echocardiogram and follow-up with Dr. Darrold Junker on Monday, able to discharge home today per [facility]   H/o depression on Prozac,depakote  Chronic hypotension and recurrent admissions for syncope;on midodine Still has bradycardia at 40's.  Obesity. I discussed with Dr. Juliann Pares. DISCHARGE CONDITIONS:  Stable, discharge to home today. CONSULTS OBTAINED:  Treatment Team:  Alwyn Pea, MD DRUG ALLERGIES:   Allergies  Allergen Reactions  . Penicillins Hives    Has patient had a PCN reaction causing immediate rash, facial/tongue/throat swelling, SOB or lightheadedness with hypotension: Yes Has patient had a PCN reaction causing severe rash involving mucus membranes or skin necrosis: No Has patient had a PCN reaction that required hospitalization: No Has patient had a PCN reaction occurring within the last 10 years: No If all of the above answers are "NO", then may proceed with Cephalosporin use.   . Sulfa Antibiotics Hives   DISCHARGE MEDICATIONS:   Allergies as of 05/06/2018      Reactions   Penicillins Hives   Has patient had a PCN reaction causing immediate rash, facial/tongue/throat swelling, SOB or lightheadedness with hypotension: Yes Has  patient had a PCN reaction causing severe rash involving mucus membranes or skin necrosis: No Has patient had a PCN reaction that required hospitalization: No Has patient had a PCN reaction occurring within the last 10 years: No If all of the above answers are "NO", then may proceed with Cephalosporin use.   Sulfa Antibiotics Hives      Medication List    STOP taking these medications   clonazePAM 1 MG tablet Commonly known as:  KLONOPIN     TAKE these medications   divalproex 500 MG 24 hr tablet Commonly known as:  DEPAKOTE ER Take 500 mg by mouth daily.   FLUoxetine 40 MG capsule Commonly known as:  PROZAC Take 40 mg by mouth daily.   midodrine 2.5 MG tablet Commonly known as:  PROAMATINE Take 1 tablet (2.5 mg total) by mouth 2 (two) times daily with a meal.   pantoprazole 40 MG tablet Commonly known as:  PROTONIX Take 1 tablet (40 mg total) by mouth daily.   sucralfate 1 g tablet Commonly known as:  CARAFATE Take 1 tablet (1 g total) by mouth 4 (four) times daily.        DISCHARGE INSTRUCTIONS:  See AVS.  If you experience worsening of your admission symptoms, develop shortness of breath, life threatening emergency, suicidal or homicidal thoughts you must seek medical attention immediately by calling 911 or calling your MD immediately  if symptoms less severe.  You Must read complete instructions/literature along with all the possible adverse reactions/side effects for all the Medicines you take and that have been prescribed to you. Take any new Medicines after you have  completely understood and accpet all the possible adverse reactions/side effects.   Please note  You were cared for by a hospitalist during your hospital stay. If you have any questions about your discharge medications or the care you received while you were in the hospital after you are discharged, you can call the unit and asked to speak with the hospitalist on call if the hospitalist that took  care of you is not available. Once you are discharged, your primary care physician will handle any further medical issues. Please note that NO REFILLS for any discharge medications will be authorized once you are discharged, as it is imperative that you return to your primary care physician (or establish a relationship with a primary care physician if you do not have one) for your aftercare needs so that they can reassess your need for medications and monitor your lab values.    On the day of Discharge:  VITAL SIGNS:  Blood pressure 103/60, pulse (!) 48, temperature 97.7 F (36.5 C), temperature source Oral, resp. rate 16, height 5\' 11"  (1.803 m), weight 116.5 kg, SpO2 99 %. PHYSICAL EXAMINATION:  GENERAL:  32 y.o.-year-old patient lying in the bed with no acute distress.  Obesity. EYES: Pupils equal, round, reactive to light and accommodation. No scleral icterus. Extraocular muscles intact.  HEENT: Head atraumatic, normocephalic. Oropharynx and nasopharynx clear.  NECK:  Supple, no jugular venous distention. No thyroid enlargement, no tenderness.  LUNGS: Normal breath sounds bilaterally, no wheezing, rales,rhonchi or crepitation. No use of accessory muscles of respiration.  CARDIOVASCULAR: S1, S2 normal. No murmurs, rubs, or gallops.  ABDOMEN: Soft, non-tender, non-distended. Bowel sounds present. No organomegaly or mass.  EXTREMITIES: No pedal edema, cyanosis, or clubbing.  NEUROLOGIC: Cranial nerves II through XII are intact. Muscle strength 5/5 in all extremities. Sensation intact. Gait not checked.  PSYCHIATRIC: The patient is alert and oriented x 3.  SKIN: No obvious rash, lesion, or ulcer.  DATA REVIEW:   CBC Recent Labs  Lab 05/05/18 0551  WBC 6.8  HGB 14.5  HCT 41.0  PLT 208    Chemistries  Recent Labs  Lab 05/05/18 0551  NA 142  K 3.6  CL 111  CO2 25  GLUCOSE 92  BUN 14  CREATININE 0.96  CALCIUM 8.3*     Microbiology Results  Results for orders placed or  performed in visit on 04/10/18  Microscopic Examination     Status: Abnormal   Collection Time: 04/10/18 10:43 AM  Result Value Ref Range Status   WBC, UA 6-10 (A) 0 - 5 /hpf Final   RBC, UA None seen 0 - 2 /hpf Final   Epithelial Cells (non renal) None seen 0 - 10 /hpf Final   Casts Present (A) None seen /lpf Final   Cast Type Hyaline casts N/A Final   Mucus, UA Present (A) Not Estab. Final   Bacteria, UA Many (A) None seen/Few Final    RADIOLOGY:  No results found.   Management plans discussed with the patient, family and they are in agreement.  CODE STATUS: Full Code   TOTAL TIME TAKING CARE OF THIS PATIENT: 32 minutes.    Shaune Pollack M.D on 05/06/2018 at 3:13 PM  Between 7am to 6pm - Pager - [phone]  After 6pm go to [email or web address] - Social research officer, government  [facility]  Office  [phone]  CC: Primary care physician; Patient, No Pcp Per   Note: This dictation was prepared with Dragon dictation along with smaller  Company secretary. Any transcriptional errors that result from this process are unintentional.

## 2018-05-10 DIAGNOSIS — K920 Hematemesis: Secondary | ICD-10-CM

## 2018-05-11 ENCOUNTER — Encounter: Payer: Self-pay | Admitting: Gastroenterology

## 2018-05-11 ENCOUNTER — Encounter

## 2018-05-11 ENCOUNTER — Ambulatory Visit: Payer: BLUE CROSS/BLUE SHIELD | Admitting: Gastroenterology

## 2018-05-12 ENCOUNTER — Other Ambulatory Visit: Payer: Self-pay

## 2018-05-12 ENCOUNTER — Telehealth: Payer: Self-pay | Admitting: General Practice

## 2018-05-12 ENCOUNTER — Encounter
Admission: RE | Admit: 2018-05-12 | Discharge: 2018-05-12 | Disposition: A | Discharge status: Home / Self Care | Payer: BLUE CROSS/BLUE SHIELD | Source: Ambulatory Visit | Attending: Cardiology | Admitting: Cardiology

## 2018-05-12 DIAGNOSIS — I495 Sick sinus syndrome: Secondary | ICD-10-CM | POA: Diagnosis not present

## 2018-05-12 DIAGNOSIS — R55 Syncope and collapse: Secondary | ICD-10-CM | POA: Diagnosis not present

## 2018-05-12 HISTORY — DX: Gastro-esophageal reflux disease without esophagitis: K21.9

## 2018-05-12 LAB — SURGICAL PCR SCREEN
MRSA, PCR: NEGATIVE
Staphylococcus aureus: NEGATIVE

## 2018-05-12 LAB — PROTIME-INR
INR: 0.97
Prothrombin Time: 12.8 seconds (ref 11.4–15.2)

## 2018-05-12 LAB — APTT: aPTT: 28 seconds (ref 24–36)

## 2018-05-12 NOTE — Telephone Encounter (Signed)
CSW Attempted to follow up with patient to discuss lost interest in things and feelings of sadness and hopelessness.  CSW was unable to get through to patient, recorder said phone number unable to be completed at this time.  Corey [name]. Zamiyah Resendes, MSW, Theresia Majors [phone]  05/12/2018 5:42 PM

## 2018-05-12 NOTE — Patient Instructions (Signed)
  Your procedure is scheduled on: Wednesday May 17, 2018 Report to Same Day Surgery 2nd floor [facility] Entrance-take elevator on left to 2nd floor.  Check in with surgery information desk.) To find out your arrival time, call [phone] 1:00-3:00 PM on Tuesday May 16, 2018  Remember: Instructions that are not followed completely may result in serious medical risk, up to and including death, or upon the discretion of your surgeon and anesthesiologist your surgery may need to be rescheduled.    __x__ 1. Do not eat food (including mints, candies, chewing gum) after midnight the night before your procedure. You may drink clear liquids up to 2 hours before you are scheduled to arrive at the hospital for your procedure.  Do not drink anything within 2 hours of your scheduled arrival to the hospital.  Approved clear liquids:  --Water or Apple juice without pulp  --Clear carbohydrate beverage such as Gatorade or Powerade  --Black Coffee or Clear Tea (No milk, no creamers, do not add anything to the coffee or tea)    __x__ 2. No Alcohol for 24 hours before or after surgery.   __x__ 3. No Smoking or e-cigarettes for 24 hours before surgery.  Do not use any chewable tobacco products for at least 6 hours before surgery.   __x__ 4. Notify your doctor if there is any change in your medical condition (cold, fever, infections).   __x__ 5. On the morning of surgery brush your teeth with toothpaste and water.  You may rinse your mouth with mouthwash if you wish.  Do not swallow any toothpaste or mouthwash.  Please read over the following fact sheets that you were given:   [facility] Preparing for Surgery and/or MRSA Information    __x__ Use CHG Soap or Sage wipes as directed on instruction sheet   Do not wear jewelry on the day of surgery.  Do not wear lotions, powders, deodorant, or perfumes.   Do not shave below the face/neck 48 hours prior to surgery.   Do not bring  valuables to the hospital.    [facility] is not responsible for any belongings or valuables.               Contacts, dentures or bridgework may not be worn into surgery.  Leave your suitcase in the car. After surgery it may be brought to your room.  For patients admitted to the hospital, discharge time is determined by your treatment team.  __x__ Take anti-hypertensive listed below, cardiac, seizure, asthma, anti-reflux and psychiatric medicines. These include:  1. Divalproex (Depakote) the night before surgery  2. Fluoxetine (Prozac)  3. Midodrine (Proamatine)  4. Pantoprazole (Protonix)  __x__ Follow recommendations from Cardiologist, Pulmonologist or PCP regarding stopping Aspirin, Coumadin, Plavix, Eliquis, Effient, Pradaxa, and Pletal.  __x__ Stop Anti-inflammatories such as Advil, Ibuprofen, Motrin, Aleve, Naproxen, Naprosyn, BC/Goodies powders or aspirin products. You may continue to take Tylenol and Celebrex.   __x__  Stop supplements until after surgery. You may continue to take Vitamin D, Vitamin B, and multivitamin.

## 2018-05-13 ENCOUNTER — Other Ambulatory Visit: Payer: Self-pay

## 2018-05-13 ENCOUNTER — Emergency Department: Payer: BLUE CROSS/BLUE SHIELD

## 2018-05-13 ENCOUNTER — Inpatient Hospital Stay
Admission: EM | Admit: 2018-05-13 | Discharge: 2018-05-16 | DRG: 244 | Disposition: A | Discharge status: Home / Self Care | Payer: BLUE CROSS/BLUE SHIELD | Attending: Internal Medicine | Admitting: Internal Medicine

## 2018-05-13 DIAGNOSIS — Z882 Allergy status to sulfonamides status: Secondary | ICD-10-CM

## 2018-05-13 DIAGNOSIS — Z79899 Other long term (current) drug therapy: Secondary | ICD-10-CM | POA: Diagnosis not present

## 2018-05-13 DIAGNOSIS — Z880 Allergy status to penicillin: Secondary | ICD-10-CM

## 2018-05-13 DIAGNOSIS — R001 Bradycardia, unspecified: Secondary | ICD-10-CM

## 2018-05-13 DIAGNOSIS — I951 Orthostatic hypotension: Secondary | ICD-10-CM | POA: Diagnosis present

## 2018-05-13 DIAGNOSIS — R55 Syncope and collapse: Secondary | ICD-10-CM | POA: Diagnosis present

## 2018-05-13 DIAGNOSIS — R296 Repeated falls: Secondary | ICD-10-CM | POA: Diagnosis present

## 2018-05-13 DIAGNOSIS — F319 Bipolar disorder, unspecified: Secondary | ICD-10-CM | POA: Diagnosis present

## 2018-05-13 DIAGNOSIS — K219 Gastro-esophageal reflux disease without esophagitis: Secondary | ICD-10-CM | POA: Diagnosis present

## 2018-05-13 DIAGNOSIS — Z950 Presence of cardiac pacemaker: Secondary | ICD-10-CM

## 2018-05-13 DIAGNOSIS — Z87442 Personal history of urinary calculi: Secondary | ICD-10-CM

## 2018-05-13 DIAGNOSIS — Z87891 Personal history of nicotine dependence: Secondary | ICD-10-CM

## 2018-05-13 DIAGNOSIS — I10 Essential (primary) hypertension: Secondary | ICD-10-CM | POA: Diagnosis present

## 2018-05-13 DIAGNOSIS — I495 Sick sinus syndrome: Secondary | ICD-10-CM | POA: Diagnosis present

## 2018-05-13 DIAGNOSIS — N400 Enlarged prostate without lower urinary tract symptoms: Secondary | ICD-10-CM | POA: Diagnosis present

## 2018-05-13 LAB — BASIC METABOLIC PANEL
Anion gap: 8 (ref 5–15)
BUN: 13 mg/dL (ref 6–20)
CALCIUM: 9 mg/dL (ref 8.9–10.3)
CO2: 26 mmol/L (ref 22–32)
CREATININE: 1.04 mg/dL (ref 0.61–1.24)
Chloride: 107 mmol/L (ref 98–111)
Glucose, Bld: 101 mg/dL — ABNORMAL HIGH (ref 70–99)
Potassium: 4 mmol/L (ref 3.5–5.1)
SODIUM: 141 mmol/L (ref 135–145)

## 2018-05-13 LAB — GLUCOSE, CAPILLARY: Glucose-Capillary: 92 mg/dL (ref 70–99)

## 2018-05-13 LAB — CBC
HCT: 45.2 % (ref 39.0–52.0)
Hemoglobin: 15.2 g/dL (ref 13.0–17.0)
MCH: 29.2 pg (ref 26.0–34.0)
MCHC: 33.6 g/dL (ref 30.0–36.0)
MCV: 86.8 fL (ref 80.0–100.0)
NRBC: 0 % (ref 0.0–0.2)
PLATELETS: 272 10*3/uL (ref 150–400)
RBC: 5.21 MIL/uL (ref 4.22–5.81)
RDW: 12.9 % (ref 11.5–15.5)
WBC: 7.1 10*3/uL (ref 4.0–10.5)

## 2018-05-13 LAB — TROPONIN I

## 2018-05-13 MED ORDER — ACETAMINOPHEN 325 MG PO TABS
650.0000 mg | ORAL_TABLET | Freq: Four times a day (QID) | ORAL | Status: DC | PRN
  Administered 2018-05-15: 650 mg via ORAL
  Filled 2018-05-13: qty 2

## 2018-05-13 MED ORDER — ATROPINE SULFATE 1 MG/10ML IJ SOSY: 1.0000 mg | PREFILLED_SYRINGE | INTRAMUSCULAR | Status: DC | PRN

## 2018-05-13 MED ORDER — DIVALPROEX SODIUM ER 500 MG PO TB24
500.0000 mg | ORAL_TABLET | Freq: Every day | ORAL | Status: DC
  Administered 2018-05-13 – 2018-05-15 (×3): 500 mg via ORAL
  Filled 2018-05-13 (×4): qty 1

## 2018-05-13 MED ORDER — PANTOPRAZOLE SODIUM 40 MG PO TBEC
40.0000 mg | DELAYED_RELEASE_TABLET | Freq: Every day | ORAL | Status: DC
  Administered 2018-05-13 – 2018-05-16 (×4): 40 mg via ORAL
  Filled 2018-05-13 (×4): qty 1

## 2018-05-13 MED ORDER — ONDANSETRON HCL 4 MG PO TABS: 4.0000 mg | ORAL_TABLET | Freq: Four times a day (QID) | ORAL | Status: DC | PRN

## 2018-05-13 MED ORDER — MIDODRINE HCL 5 MG PO TABS
2.5000 mg | ORAL_TABLET | Freq: Two times a day (BID) | ORAL | Status: DC
  Administered 2018-05-13 – 2018-05-15 (×5): 2.5 mg via ORAL
  Filled 2018-05-13 (×7): qty 1

## 2018-05-13 MED ORDER — ONDANSETRON HCL 4 MG/2ML IJ SOLN: 4.0000 mg | Freq: Four times a day (QID) | INTRAMUSCULAR | Status: DC | PRN

## 2018-05-13 MED ORDER — SODIUM CHLORIDE 0.9% FLUSH
3.0000 mL | Freq: Two times a day (BID) | INTRAVENOUS | Status: DC
  Administered 2018-05-13 – 2018-05-15 (×6): 3 mL via INTRAVENOUS

## 2018-05-13 MED ORDER — SENNOSIDES-DOCUSATE SODIUM 8.6-50 MG PO TABS: 1.0000 | ORAL_TABLET | Freq: Every evening | ORAL | Status: DC | PRN

## 2018-05-13 MED ORDER — BISACODYL 5 MG PO TBEC: 5.0000 mg | DELAYED_RELEASE_TABLET | Freq: Every day | ORAL | Status: DC | PRN

## 2018-05-13 MED ORDER — SODIUM CHLORIDE 0.9% FLUSH
3.0000 mL | Freq: Two times a day (BID) | INTRAVENOUS | Status: DC
  Administered 2018-05-13 – 2018-05-15 (×4): 3 mL via INTRAVENOUS

## 2018-05-13 MED ORDER — SUCRALFATE 1 G PO TABS
1.0000 g | ORAL_TABLET | Freq: Four times a day (QID) | ORAL | Status: DC
  Administered 2018-05-13 – 2018-05-16 (×12): 1 g via ORAL
  Filled 2018-05-13 (×12): qty 1

## 2018-05-13 MED ORDER — ENOXAPARIN SODIUM 40 MG/0.4ML ~~LOC~~ SOLN: 40.0000 mg | SUBCUTANEOUS | Status: DC

## 2018-05-13 MED ORDER — FLUOXETINE HCL 20 MG PO CAPS
40.0000 mg | ORAL_CAPSULE | Freq: Every day | ORAL | Status: DC
  Administered 2018-05-13 – 2018-05-16 (×4): 40 mg via ORAL
  Filled 2018-05-13 (×4): qty 2

## 2018-05-13 MED ORDER — ENOXAPARIN SODIUM 40 MG/0.4ML ~~LOC~~ SOLN: 40.0000 mg | Freq: Every day | SUBCUTANEOUS | Status: DC

## 2018-05-13 MED ORDER — ACETAMINOPHEN 650 MG RE SUPP: 650.0000 mg | Freq: Four times a day (QID) | RECTAL | Status: DC | PRN

## 2018-05-13 NOTE — ED Notes (Signed)
ED Provider at bedside. 

## 2018-05-13 NOTE — ED Triage Notes (Signed)
Patient coming [facility] from home for bradycardia and syncope. Patient's heart rate ranged from 28-42 for EMS. Patient's BP 142/86. Patient in tub during syncopal episode. Patient denies injury/pain.

## 2018-05-13 NOTE — Consult Note (Signed)
[facility] Cardiology  CARDIOLOGY CONSULT NOTE  Patient ID: Corey [name] MRN: [id number] DOB/AGE: 1986-08-02 32 y.o.  Admit date: 05/13/2018 Referring Physician Allena Katz Primary Physician  Primary Cardiologist Jovoni Borkenhagen Reason for Consultation sinus bradycardia  HPI: 32 year old gentleman referred for evaluation of sinus bradycardia.  Admitted after syncopal episode while in the bathroom.  Patient noted to be markedly bradycardic and [facility] ED.  G revealed sinus bradycardia at 37 bpm.  Admitted to telemetry where there has revealed predominant bradycardia in the 30s.  Has had 3 recent hospitalizations 04/23/2018, 9/28 2019, 05/04/2018 for recurrent syncope, presyncope, dizziness and marked bradycardia rate not being on AV nodal blocking agents.  48-hour Holter monitor as outpatient revealed predominant sinus bradycardia with mean heart rate of 58 bpm with minimum heart rate of 34 bpm.  2D echocardiogram on 04/24/2018 revealed normal left ventricular function, with LVEF of 55 to 65%.  In outpatient, the patient has had persistent as of dizziness static lightheadedness that has prevented him to return to work.  Patient is scheduled for dual-chamber pacemaker as an outpatient on 05/17/2018.  Review of systems complete and found to be negative unless listed above     Past Medical History:  Diagnosis Date  . Bipolar 1 disorder (HCC)   . GERD (gastroesophageal reflux disease)   . HTN (hypertension)     Past Surgical History:  Procedure Laterality Date  . ESOPHAGOGASTRODUODENOSCOPY (EGD) WITH PROPOFOL N/A 04/26/2018   Procedure: ESOPHAGOGASTRODUODENOSCOPY (EGD) WITH PROPOFOL;  Surgeon: Wyline Mood, MD;  Location: [facility] ENDOSCOPY;  Service: Gastroenterology;  Laterality: N/A;  . KNEE ARTHROSCOPY      Medications Prior to Admission  Medication Sig Dispense Refill Last Dose  . divalproex (DEPAKOTE ER) 500 MG 24 hr tablet Take 500 mg by mouth at bedtime.   1 Past Week at Unknown time  . FLUoxetine (PROZAC) 40  MG capsule Take 40 mg by mouth daily.  0 05/12/2018 at 1000  . midodrine (PROAMATINE) 2.5 MG tablet Take 1 tablet (2.5 mg total) by mouth 2 (two) times daily with a meal. 60 tablet 0 05/12/2018 at 1830  . pantoprazole (PROTONIX) 40 MG tablet Take 1 tablet (40 mg total) by mouth daily. 30 tablet 1 05/12/2018 at 1000  . sucralfate (CARAFATE) 1 g tablet Take 1 tablet (1 g total) by mouth 4 (four) times daily. 60 tablet 0 05/12/2018 at 1200   Social History   Socioeconomic History  . Marital status: Legally Separated    Spouse name: Not on file  . Number of children: 3  . Years of education: Not on file  . Highest education level: Not on file  Occupational History  . Not on file  Social Needs  . Financial resource strain: Not hard at all  . Food insecurity:    Worry: Never true    Inability: Never true  . Transportation needs:    Medical: No    Non-medical: No  Tobacco Use  . Smoking status: Former Games developer  . Smokeless tobacco: Current User    Types: Snuff  Substance and Sexual Activity  . Alcohol use: No  . Drug use: Not Currently    Types: Marijuana  . Sexual activity: Not on file  Lifestyle  . Physical activity:    Days per week: 7 days    Minutes per session: 60 min  . Stress: Not at all  Relationships  . Social connections:    Talks on phone: More than three times a week    Gets together: More  than three times a week    Attends religious service: Never    Active member of club or organization: No    Attends meetings of clubs or organizations: Never    Relationship status: Separated  . Intimate partner violence:    Fear of current or ex partner: No    Emotionally abused: No    Physically abused: No    Forced sexual activity: No  Other Topics Concern  . Not on file  Social History Narrative  . Not on file    Family History  Problem Relation Age of Onset  . Kidney cancer Neg Hx   . Bladder Cancer Neg Hx   . Prostate cancer Neg Hx       Review of systems  complete and found to be negative unless listed above      PHYSICAL EXAM  General: Well developed, well nourished, in no acute distress HEENT:  Normocephalic and atramatic Neck:  No JVD.  Lungs: Clear bilaterally to auscultation and percussion. Heart: HRRR . Normal S1 and S2 without gallops or murmurs.  Abdomen: Bowel sounds are positive, abdomen soft and non-tender  Msk:  Back normal, normal gait. Normal strength and tone for age. Extremities: No clubbing, cyanosis or edema.   Neuro: Alert and oriented X 3. Psych:  Good affect, responds appropriately  Labs:   Lab Results  Component Value Date   WBC 7.1 05/13/2018   HGB 15.2 05/13/2018   HCT 45.2 05/13/2018   MCV 86.8 05/13/2018   PLT 272 05/13/2018    Recent Labs  Lab 05/13/18 0137  NA 141  K 4.0  CL 107  CO2 26  BUN 13  CREATININE 1.04  CALCIUM 9.0  GLUCOSE 101*   Lab Results  Component Value Date   CKTOTAL 154 04/17/2018   TROPONINI <0.03 05/13/2018   No results found for: CHOL No results found for: HDL No results found for: LDLCALC No results found for: TRIG No results found for: CHOLHDL No results found for: LDLDIRECT    Radiology: Ct Head Wo Contrast  Result Date: 04/29/2018 CLINICAL DATA:  32 year old male with near syncope. EXAM: CT HEAD WITHOUT CONTRAST TECHNIQUE: Contiguous axial images were obtained from the base of the skull through the vertex without intravenous contrast. COMPARISON:  Head CT dated 08/10/2010 FINDINGS: Brain: No evidence of acute infarction, hemorrhage, hydrocephalus, extra-axial collection or mass lesion/mass effect. Vascular: No hyperdense vessel or unexpected calcification. Skull: Normal. Negative for fracture or focal lesion. Sinuses/Orbits: Diffuse mucoperiosteal thickening of paranasal sinuses. No air-fluid levels. The mastoid air cells are clear. Other: None IMPRESSION: 1. Normal noncontrast CT of the brain. 2. Mild paranasal sinus disease. Electronically Signed   By: Elgie Collard M.D.   On: 04/29/2018 02:51   Dg Chest Portable 1 View  Result Date: 05/13/2018 CLINICAL DATA:  Bradycardia and syncope EXAM: PORTABLE CHEST 1 VIEW COMPARISON:  01/22/2018 FINDINGS: The heart size and mediastinal contours are within normal limits. Both lungs are clear. The visualized skeletal structures are unremarkable. IMPRESSION: No active disease. Electronically Signed   By: Burman Nieves M.D.   On: 05/13/2018 02:17    EKG: Marked sinus bradycardia  ASSESSMENT AND PLAN:   1.  Marked sinus bradycardia, symptomatic, with presyncope, syncope, orthostatic lightheadedness  Recommendations  1.  Proceed with dual-chamber pacemaker implantation on 05/19/2018.  The risks, benefits alternatives of permanent pacemaker implantation were explained to the patient and informed written consent was obtained.  Signed: Marcina Millard MD,PhD, [facility] 05/13/2018, 9:35 AM

## 2018-05-13 NOTE — ED Notes (Signed)
Admitting MD at bedside.

## 2018-05-13 NOTE — ED Notes (Addendum)
Code cart at bedside. Pads placed on patient.

## 2018-05-13 NOTE — ED Provider Notes (Signed)
[facility] Emergency Department Provider Note   ____________________________________________   First MD Initiated Contact with Patient 05/13/18 0142     (approximate)  I have reviewed the triage vital signs and the nursing notes.   HISTORY  Chief Complaint Bradycardia and Loss of Consciousness    HPI Corey [name] is a 32 y.o. male brought to the ED from home via EMS for recurrent syncope and bradycardia.  Patient has had almost weekly hospitalizations since the summertime for the above symptoms.  Scheduled for pacemaker placement on 10/16.  Reports he fell twice yesterday secondary to feeling lightheaded.  Tonight he had a syncopal episode when he grabbed his cell phone in the bathroom.  Awoke in the empty bathtub.  Voices no complaints of injury or pain.  States he has not seen any results since being placed on the Mitodrine 2 hospitalizations ago.  Reports his heart rate keeps getting lower and lower.  EMS reports heart rate ranging from 28-42.  Complains of feeling generally weak and fatigued.  Denies headache, vision changes, neck pain, chest pain, shortness of breath, abdominal pain, nausea or vomiting.  Denies recent travel.   Past Medical History:  Diagnosis Date  . Bipolar 1 disorder (HCC)   . GERD (gastroesophageal reflux disease)   . HTN (hypertension)     Patient Active Problem List   Diagnosis Date Noted  . Bipolar 1 disorder (HCC) 05/04/2018  . Syncope and collapse 04/29/2018  . Hypotension 04/29/2018  . Bradycardia 04/24/2018  . 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 Kidney stone 03/26/2018    Past Surgical History:  Procedure Laterality Date  . ESOPHAGOGASTRODUODENOSCOPY (EGD) WITH PROPOFOL N/A 04/26/2018   Procedure: ESOPHAGOGASTRODUODENOSCOPY (EGD) WITH PROPOFOL;  Surgeon: Wyline Mood, MD;  Location: [facility] ENDOSCOPY;  Service: Gastroenterology;  Laterality: N/A;  . KNEE ARTHROSCOPY      Prior to Admission medications     Medication Sig Start Date End Date Taking? Authorizing Provider  divalproex (DEPAKOTE ER) 500 MG 24 hr tablet Take 500 mg by mouth at bedtime.  04/24/18  Yes [provider]  FLUoxetine (PROZAC) 40 MG capsule Take 40 mg by mouth daily. 02/28/18  Yes [provider]  midodrine (PROAMATINE) 2.5 MG tablet Take 1 tablet (2.5 mg total) by mouth 2 (two) times daily with a meal. 05/02/18  Yes Mayo, Allyn Kenner, MD  pantoprazole (PROTONIX) 40 MG tablet Take 1 tablet (40 mg total) by mouth daily. 03/27/18 05/26/18 Yes Sainani, Rolly Pancake, MD  sucralfate (CARAFATE) 1 g tablet Take 1 tablet (1 g total) by mouth 4 (four) times daily. 04/01/18  Yes Sharman Cheek, MD    Allergies Penicillins and Sulfa antibiotics  Family History  Problem Relation Age of Onset  . Kidney cancer Neg Hx   . Bladder Cancer Neg Hx   . Prostate cancer Neg Hx     Social History Social History   Tobacco Use  . Smoking status: Former Games developer  . Smokeless tobacco: Current User    Types: Snuff  Substance Use Topics  . Alcohol use: No  . Drug use: Not Currently    Types: Marijuana    Review of Systems  Constitutional: No fever/chills Eyes: No visual changes. ENT: No sore throat. Cardiovascular: Positive for bradycardia.  Denies chest pain. Respiratory: Denies shortness of breath. Gastrointestinal: No abdominal pain.  No nausea, no vomiting.  No diarrhea.  No constipation. Genitourinary: Negative for dysuria. Musculoskeletal: Negative for back pain. Skin: Negative for rash. Neurological: Positive for recurrent  syncope.  Negative for headaches, focal weakness or numbness.   ____________________________________________   PHYSICAL EXAM:  VITAL SIGNS: ED Triage Vitals  Enc Vitals Group     BP 05/13/18 0133 135/86     Pulse Rate 05/13/18 0133 (!) 36     Resp 05/13/18 0133 17     Temp 05/13/18 0133 98 F (36.7 C)     Temp src --      SpO2 --      Weight 05/13/18 0134 259 lb (117.5 kg)      Height 05/13/18 0134 5\' 11"  (1.803 m)     Head Circumference --      Peak Flow --      Pain Score 05/13/18 0134 0     Pain Loc --      Pain Edu? --      Excl. in GC? --     Constitutional: Alert and oriented.  Ill appearing and in moderate acute distress. Eyes: Conjunctivae are normal. PERRL. EOMI. Head: Atraumatic. Nose: No congestion/rhinnorhea. Mouth/Throat: Mucous membranes are moist.  Oropharynx non-erythematous. Neck: No stridor.  No cervical spine tenderness to palpation. Cardiovascular: Bradycardic rate, regular rhythm. Grossly normal heart sounds.  Good peripheral circulation. Respiratory: Normal respiratory effort.  No retractions. Lungs CTAB. Gastrointestinal: Soft and nontender. No distention. No abdominal bruits. No CVA tenderness. Musculoskeletal: No lower extremity tenderness nor edema.  No joint effusions. Neurologic:  Normal speech and language. No gross focal neurologic deficits are appreciated.  Skin:  Skin is warm, dry and intact. No rash noted. Psychiatric: Mood and affect are normal. Speech and behavior are normal.  ____________________________________________   LABS (all labs ordered are listed, but only abnormal results are displayed)  Labs Reviewed  BASIC METABOLIC PANEL - Abnormal; Notable for the following components:      Result Value   Glucose, Bld 101 (*)    All other components within normal limits  CBC  TROPONIN I   ____________________________________________  EKG  ED ECG REPORT I, Issaic Welliver J, the attending physician, personally viewed and interpreted this ECG.   Date: 05/13/2018  EKG Time: 0135  Rate: 37  Rhythm: sinus bradycardia  Axis: LAD  Intervals: QTC 492  ST&T Change: Nonspecific Prolonged QTC compared to EKGs dated 9/28, 10/2 and 10/3 ____________________________________________  RADIOLOGY  ED MD interpretation: No acute cardiopulmonary process  Official radiology report(s): Dg Chest Portable 1 View  Result Date:  05/13/2018 CLINICAL DATA:  Bradycardia and syncope EXAM: PORTABLE CHEST 1 VIEW COMPARISON:  01/22/2018 FINDINGS: The heart size and mediastinal contours are within normal limits. Both lungs are clear. The visualized skeletal structures are unremarkable. IMPRESSION: No active disease. Electronically Signed   By: Burman Nieves M.D.   On: 05/13/2018 02:17    ____________________________________________   PROCEDURES  Procedure(s) performed: None  Procedures  Critical Care performed: Yes, see critical care note(s)   CRITICAL CARE Performed by: Irean Hong   Total critical care time: 30 minutes  Critical care time was exclusive of separately billable procedures and treating other patients.  Critical care was necessary to treat or prevent imminent or life-threatening deterioration.  Critical care was time spent personally by me on the following activities: development of treatment plan with patient and/or surrogate as well as nursing, discussions with consultants, evaluation of patient's response to treatment, examination of patient, obtaining history from patient or surrogate, ordering and performing treatments and interventions, ordering and review of laboratory studies, ordering and review of radiographic studies, pulse oximetry and re-evaluation of patient's  condition.  ____________________________________________   INITIAL IMPRESSION / ASSESSMENT AND PLAN / ED COURSE  As part of my medical decision making, I reviewed the following data within the electronic MEDICAL RECORD NUMBER History obtained from family, Nursing notes reviewed and incorporated, Labs reviewed, EKG interpreted, Old EKG reviewed, Old chart reviewed, Radiograph reviewed, Discussed with admitting physician and Notes from prior ED visits   32 year old male who presents with recurrent syncope and bradycardia; scheduled for pacemaker placement in 4 days.  Differential diagnosis includes but is not limited to CAD,  symptomatic bradycardia, ICH, infectious, metabolic etiologies, etc.  Will obtain cardiac labs, orthostatic vital signs, initiate IV fluid resuscitation.  Anticipate hospitalization.  Placed on pacer pads empirically.   Clinical Course as of May 13 550  Sat May 13, 2018  0248 Positive orthostatics, dropping almost 60 points of blood pressure and increased over 30 points for heart rate.  Discussed with patient and his mother.  Will discuss with hospitalist to evaluate patient in emergency department for admission.  Hopefully patient may get his pacemaker on Monday instead of Wednesday.   [JS]    Clinical Course User Index [JS] Irean Hong, MD     ____________________________________________   FINAL CLINICAL IMPRESSION(S) / ED DIAGNOSES  Final diagnoses:  Syncope and collapse  Bradycardia  Orthostasis     ED Discharge Orders    None       Note:  This document was prepared using Dragon voice recognition software and may include unintentional dictation errors.    Irean Hong, MD 05/13/18 [phone]

## 2018-05-13 NOTE — Progress Notes (Signed)
[facility] - [place] [facility]   PATIENT NAME: Corey [name]    MR#:  [id number]  DATE OF BIRTH:  1986/01/29  SUBJECTIVE:  patient came in with recurrent syncopal episode. Found to have significant bradycardia. Heart rate still remains in the 40s.  REVIEW OF SYSTEMS:   Review of Systems  Constitutional: Negative for chills, fever and weight loss.  HENT: Negative for ear discharge, ear pain and nosebleeds.   Eyes: Negative for blurred vision, pain and discharge.  Respiratory: Negative for sputum production, shortness of breath, wheezing and stridor.   Cardiovascular: Negative for chest pain, palpitations, orthopnea and PND.  Gastrointestinal: Negative for abdominal pain, diarrhea, nausea and vomiting.  Genitourinary: Negative for frequency and urgency.  Musculoskeletal: Negative for back pain and joint pain.  Neurological: Positive for weakness. Negative for sensory change, speech change and focal weakness.  Psychiatric/Behavioral: Negative for depression and hallucinations. The patient is not nervous/anxious.    Tolerating Diet:yes Tolerating PT: not needed  DRUG ALLERGIES:   Allergies  Allergen Reactions  . Penicillins Hives    Has patient had a PCN reaction causing immediate rash, facial/tongue/throat swelling, SOB or lightheadedness with hypotension: Yes Has patient had a PCN reaction causing severe rash involving mucus membranes or skin necrosis: No Has patient had a PCN reaction that required hospitalization: No Has patient had a PCN reaction occurring within the last 10 years: No If all of the above answers are "NO", then may proceed with Cephalosporin use.   . Sulfa Antibiotics Hives    VITALS:  Blood pressure 121/71, pulse (!) 43, temperature 97.8 F (36.6 C), temperature source Oral, resp. rate 19, height 5\' 11"  (1.803 m), weight 115.3 kg, SpO2 99 %.  PHYSICAL EXAMINATION:   Physical Exam  GENERAL:  32 y.o.-year-old patient lying in  the bed with no acute distress. Obese EYES: Pupils equal, round, reactive to light and accommodation. No scleral icterus. Extraocular muscles intact.  HEENT: Head atraumatic, normocephalic. Oropharynx and nasopharynx clear.  NECK:  Supple, no jugular venous distention. No thyroid enlargement, no tenderness.  LUNGS: Normal breath sounds bilaterally, no wheezing, rales, rhonchi. No use of accessory muscles of respiration.  CARDIOVASCULAR: S1, S2 normal. No murmurs, rubs, or gallops.  ABDOMEN: Soft, nontender, nondistended. Bowel sounds present. No organomegaly or mass.  EXTREMITIES: No cyanosis, clubbing or edema b/l.    NEUROLOGIC: Cranial nerves II through XII are intact. No focal Motor or sensory deficits b/l.   PSYCHIATRIC:  patient is alert and oriented x 3.  SKIN: No obvious rash, lesion, or ulcer.   LABORATORY PANEL:  CBC Recent Labs  Lab 05/13/18 0137  WBC 7.1  HGB 15.2  HCT 45.2  PLT 272    Chemistries  Recent Labs  Lab 05/13/18 0137  NA 141  K 4.0  CL 107  CO2 26  GLUCOSE 101*  BUN 13  CREATININE 1.04  CALCIUM 9.0   Cardiac Enzymes Recent Labs  Lab 05/13/18 0137  TROPONINI <0.03   RADIOLOGY:  Dg Chest Portable 1 View  Result Date: 05/13/2018 CLINICAL DATA:  Bradycardia and syncope EXAM: PORTABLE CHEST 1 VIEW COMPARISON:  01/22/2018 FINDINGS: The heart size and mediastinal contours are within normal limits. Both lungs are clear. The visualized skeletal structures are unremarkable. IMPRESSION: No active disease. Electronically Signed   By: Burman Nieves M.D.   On: 05/13/2018 02:17   ASSESSMENT AND PLAN:  Corey [name] is a 32 y.o. male brought to the ED from home via EMS  for recurrent syncope and bradycardia.  Patient has had almost weekly hospitalizations since the summertime for the above symptoms.  Scheduled for pacemaker placement on 10/16.  Reports he fell twice yesterday secondary to feeling lightheaded.  1. symptomatic bradycardia recurrent causing  lightheadedness and dizziness recurrent falls and several admissions for the same -also had Holter monitoring as outpatient which showed bradycardia. -Seen by cardiology Dr. Darrold Junker. Scheduled for pacemaker placement on 05/15/2018 -patient not on any rate blocking agent  2. bipolar disorder -continue Prozac and Depakote  3. DVT prophylaxis SCD, pt is ambulatory  D/w dr Cassie Freer  Case discussed with Care Management/Social Worker. Management plans discussed with the patient, family and they are in agreement.  CODE STATUS: full  DVT Prophylaxis: scd  TOTAL TIME TAKING CARE OF THIS PATIENT: *30* minutes.  >50% time spent on counselling and coordination of care  POSSIBLE D/C IN *1-2* DAYS, DEPENDING ON CLINICAL CONDITION.  Note: This dictation was prepared with Dragon dictation along with smaller phrase technology. Any transcriptional errors that result from this process are unintentional.  Enedina Finner M.D on 05/13/2018 at 10:21 AM  Between 7am to 6pm - Pager - [phone]  After 6pm go to [email or web address] - password [email or web address]  [facility]  Office  [phone]  CC: Primary care physician; Patient, No Pcp PerPatient ID: Corey [name], male   DOB: [birth year], 32 y.o.   MRN: [id number]

## 2018-05-13 NOTE — H&P (Signed)
[facility] [place] at [facility]   PATIENT NAME: Corey [name]    MR#:  [id number]  DATE OF BIRTH:  [birth year]  DATE OF ADMISSION:  05/13/2018  PRIMARY CARE PHYSICIAN: Patient, No Pcp Per   REQUESTING/REFERRING PHYSICIAN: Irean Hong, MD  CHIEF COMPLAINT:   Chief Complaint  Patient presents with  . Bradycardia  . Loss of Consciousness    HISTORY OF PRESENT ILLNESS:  Corey [name]  is a rather unfortunate 32 y.o. young man with a known history of HTN, BPH, who p/w recurrent episodes of lightheadedness/near-syncope, syncope, orthostatic hypotension and symptomatic bradycardia. This poor man has had something to the effect of six ED visits and multiple hospitalizations for the aforementioned issues: 09/11: [facility] ED 09/18: [facility] ED 09/19: [facility] ED 09/23: [facility] ED, admitted, D/Ced 9/25 09/28: [facility] ED, admitted (by yours truly), D/Ced 10/01 10/03: [facility] ED, admitted, D/Ced 10/05  Pt had 48hr Holter monitoring after his 05/02/2018 D/C. He also had outpt Echo on 04/24/2018. He had a procedure visit w/ Dr. Darrold Junker on 05/08/2018, and per my understanding, is scheduled to have PPM placement on Tuesday 05/16/2018.  On present admission, pt endorses lightheadedness/near-syncope and fall x2 on Friday (05/12/2018) evening. He states he hit his anterior forehead and developed a small bump on the head, but denies laceration/bleeding or significant head injury resulting in HA, blurred vision, LOC or neurological symptomatology. He states that early in the morning on Saturday (05/13/2018), after his child had gone to bed, he was sitting in the living room relaxing. He states he walked to the bathroom, and then went back to the living room to sit down and play XBox. He states he realized he forgot his phone in the bathroom, got up and went to get it. While he was in the bathroom, he experienced sudden LOC, and awoke a short time later to find himself lying on his side in the bathtub. EMS  was called. He denies significant injury/pain at the time of my assessment. He appears absolutely weak, tired and miserable. His complexion is greyish, he appears mildly diaphoretic, and his HR is in the 30s. He is nonetheless mentating well, AAOx3 and is able to answer all of my questions w/o difficulty/issue. His BP is stable, but his orthostatics were (+) in the ED.  PAST MEDICAL HISTORY:   Past Medical History:  Diagnosis Date  . Bipolar 1 disorder (HCC)   . GERD (gastroesophageal reflux disease)   . HTN (hypertension)     PAST SURGICAL HISTORY:   Past Surgical History:  Procedure Laterality Date  . ESOPHAGOGASTRODUODENOSCOPY (EGD) WITH PROPOFOL N/A 04/26/2018   Procedure: ESOPHAGOGASTRODUODENOSCOPY (EGD) WITH PROPOFOL;  Surgeon: Wyline Mood, MD;  Location: [facility] ENDOSCOPY;  Service: Gastroenterology;  Laterality: N/A;  . KNEE ARTHROSCOPY      SOCIAL HISTORY:   Social History   Tobacco Use  . Smoking status: Former Games developer  . Smokeless tobacco: Current User    Types: Snuff  Substance Use Topics  . Alcohol use: No    FAMILY HISTORY:   Family History  Problem Relation Age of Onset  . Kidney cancer Neg Hx   . Bladder Cancer Neg Hx   . Prostate cancer Neg Hx     DRUG ALLERGIES:   Allergies  Allergen Reactions  . Penicillins Hives    Has patient had a PCN reaction causing immediate rash, facial/tongue/throat swelling, SOB or lightheadedness with hypotension: Yes Has patient had a PCN reaction causing severe rash involving mucus membranes  or skin necrosis: No Has patient had a PCN reaction that required hospitalization: No Has patient had a PCN reaction occurring within the last 10 years: No If all of the above answers are "NO", then may proceed with Cephalosporin use.   . Sulfa Antibiotics Hives    REVIEW OF SYSTEMS:   Review of Systems  Constitutional: Positive for malaise/fatigue. Negative for chills, diaphoresis, fever and weight loss.  HENT: Negative for  congestion, ear pain, hearing loss, nosebleeds, sinus pain, sore throat and tinnitus.   Eyes: Negative for blurred vision, double vision, photophobia, pain, discharge and redness.  Respiratory: Negative for cough, hemoptysis, sputum production, shortness of breath and wheezing.   Cardiovascular: Negative for chest pain, palpitations, orthopnea, claudication, leg swelling and PND.  Gastrointestinal: Negative for abdominal pain, blood in stool, constipation, diarrhea, heartburn, melena, nausea and vomiting.  Genitourinary: Negative for dysuria, frequency, hematuria and urgency.  Musculoskeletal: Positive for falls. Negative for back pain, joint pain, myalgias and neck pain.  Skin: Negative for itching and rash.  Neurological: Positive for dizziness, loss of consciousness and weakness. Negative for tingling, tremors, sensory change, speech change, focal weakness, seizures and headaches.  Psychiatric/Behavioral: Negative for memory loss. The patient does not have insomnia.    MEDICATIONS AT HOME:   Prior to Admission medications   Medication Sig Start Date End Date Taking? Authorizing Provider  divalproex (DEPAKOTE ER) 500 MG 24 hr tablet Take 500 mg by mouth at bedtime.  04/24/18  Yes [provider]  FLUoxetine (PROZAC) 40 MG capsule Take 40 mg by mouth daily. 02/28/18  Yes [provider]  midodrine (PROAMATINE) 2.5 MG tablet Take 1 tablet (2.5 mg total) by mouth 2 (two) times daily with a meal. 05/02/18  Yes Mayo, Allyn Kenner, MD  pantoprazole (PROTONIX) 40 MG tablet Take 1 tablet (40 mg total) by mouth daily. 03/27/18 05/26/18 Yes Sainani, Rolly Pancake, MD  sucralfate (CARAFATE) 1 g tablet Take 1 tablet (1 g total) by mouth 4 (four) times daily. 04/01/18  Yes Sharman Cheek, MD      VITAL SIGNS:  Blood pressure 113/67, pulse (!) 37, temperature 97.9 F (36.6 C), temperature source Oral, resp. rate 16, height 5\' 11"  (1.803 m), weight 115.3 kg, SpO2 99 %.  PHYSICAL EXAMINATION:    Physical Exam  Constitutional: He is oriented to person, place, and time. He appears well-developed and well-nourished. He is active and cooperative.  Non-toxic appearance. He does not have a sickly appearance. He appears ill. No distress. He is not intubated.  HENT:  Head: Normocephalic and atraumatic.  Mouth/Throat: Oropharynx is clear and moist. No oropharyngeal exudate.  Eyes: Conjunctivae, EOM and lids are normal. No scleral icterus.  Neck: Neck supple. No JVD present. No thyromegaly present.  Cardiovascular: Regular rhythm, S1 normal, S2 normal and normal heart sounds.  No extrasystoles are present. Bradycardia present. Exam reveals no gallop, no S3, no S4, no distant heart sounds and no friction rub.  No murmur heard. Pulmonary/Chest: Effort normal and breath sounds normal. No accessory muscle usage or stridor. No apnea, no tachypnea and no bradypnea. He is not intubated. No respiratory distress. He has no decreased breath sounds. He has no wheezes. He has no rhonchi. He has no rales.  Abdominal: Soft. Bowel sounds are normal. He exhibits no distension. There is no tenderness. There is no rigidity, no rebound and no guarding.  Musculoskeletal: Normal range of motion. He exhibits no edema or tenderness.  Lymphadenopathy:    He has no cervical adenopathy.  Neurological: He is alert and oriented to person, place, and time. He is not disoriented.  Skin: Skin is warm and intact. He is diaphoretic. No erythema. No pallor.  Psychiatric: He has a normal mood and affect. His speech is normal and behavior is normal. Judgment and thought content normal. Cognition and memory are normal.   LABORATORY PANEL:   CBC Recent Labs  Lab 05/13/18 0137  WBC 7.1  HGB 15.2  HCT 45.2  PLT 272   ------------------------------------------------------------------------------------------------------------------  Chemistries  Recent Labs  Lab 05/13/18 0137  NA 141  K 4.0  CL 107  CO2 26  GLUCOSE  101*  BUN 13  CREATININE 1.04  CALCIUM 9.0   ------------------------------------------------------------------------------------------------------------------  Cardiac Enzymes Recent Labs  Lab 05/13/18 0137  TROPONINI <0.03   ------------------------------------------------------------------------------------------------------------------  RADIOLOGY:  Dg Chest Portable 1 View  Result Date: 05/13/2018 CLINICAL DATA:  Bradycardia and syncope EXAM: PORTABLE CHEST 1 VIEW COMPARISON:  01/22/2018 FINDINGS: The heart size and mediastinal contours are within normal limits. Both lungs are clear. The visualized skeletal structures are unremarkable. IMPRESSION: No active disease. Electronically Signed   By: Burman Nieves M.D.   On: 05/13/2018 02:17   IMPRESSION AND PLAN:   A/P: 89M p/w recurrent episodes of lightheadedness/near-syncope, syncope, orthostatic hypotension and symptomatic bradycardia. Mild hyperglycemia. -Recurrent LH/near-syncope, syncope, orthostatic hypotension, symptomatic bradycardia: Multiple ED visits, hospitalizations and outpt workup of cardiac issues. Most recently, pt saw Dr. Darrold Junker on 05/08/2018, and to my understanding, is to have a PPM placed on Tuesday 05/16/2018. He presents in the interrim w/ recurrent symptoms. HR 30s. He is obviously not on any nodal blocking agents. Pt appears greyish and diaphoretic. He appears ill and weak and miserable, but is doing his best to stay in good spirits. I am of the hope that he can have his pacemaker placed sooner rather than later. Will consult Cardiology. Orthostatics (+), c/w IVF and Midodrine. Tele, cardiac monitoring. Pt presently ordered for diet and DVT PPx, both of which will need to be held if pt is to have procedure. Atropine PRN. -c/w other home meds/formulary subs. -FEN/GI: Regular diet. -DVT PPx: Lovenox. -Code status: Full code. -Disposition: Admission, > 2 midnights.   All the records are reviewed and case  discussed with ED provider. Management plans discussed with the patient, family and they are in agreement.  CODE STATUS: Full code.  TOTAL TIME TAKING CARE OF THIS PATIENT: 75 minutes.    Barbaraann Rondo M.D on 05/13/2018 at 7:46 AM  Between 7am to 6pm - Pager - [phone]  After 6pm go to [email or web address] - Social research officer, government  [facility] [place] [facility]  Office  [phone]  CC: Primary care physician; Patient, No Pcp Per   Note: This dictation was prepared with Dragon dictation along with smaller phrase technology. Any transcriptional errors that result from this process are unintentional.

## 2018-05-13 NOTE — Progress Notes (Signed)
MRSA swab on 05/12/18 negative Consent for pacemaker signed   Patient is symptomatically orthostatic. Education given to patient to call nursing staff if he needs to get out of the bed at all.

## 2018-05-13 NOTE — ED Notes (Signed)
Patient reports he fell twice yesterday and struck his head. Patient reports first fall he struck his head on dog crate, second time on the door frame. Patient is tender to palpation of right forehead.

## 2018-05-13 NOTE — ED Notes (Signed)
Registration at bedside.

## 2018-05-14 NOTE — Progress Notes (Signed)
[facility]  SUBJECTIVE: Patient laying in bed, denies chest pain, shortness of breath, palpitations, presyncope or syncope   Vitals:   05/13/18 1941 05/13/18 2043 05/14/18 0448 05/14/18 0915  BP: (!) 93/35 (!) 96/57 129/79 (!) 103/52  Pulse: (!) 57 (!) 50 (!) 46 (!) 53  Resp: 16  18 18   Temp: 98.2 F (36.8 C)  98.7 F (37.1 C) 98.2 F (36.8 C)  TempSrc: Oral  Oral Oral  SpO2: 98%  99% 98%  Weight:      Height:         Intake/Output Summary (Last 24 hours) at 05/14/2018 1406 Last data filed at 05/14/2018 1100 Gross per 24 hour  Intake 960 ml  Output 1150 ml  Net -190 ml      PHYSICAL EXAM  General: Well developed, well nourished, in no acute distress HEENT:  Normocephalic and atramatic Neck:  No JVD.  Lungs: Clear bilaterally to auscultation and percussion. Heart: Bradycardia. Normal S1 and S2 without gallops or murmurs.  Abdomen: Bowel sounds are positive, abdomen soft and non-tender  Msk:  Back normal, normal gait. Normal strength and tone for age. Extremities: No clubbing, cyanosis or edema.   Neuro: Alert and oriented X 3. Psych:  Good affect, responds appropriately   LABS: Basic Metabolic Panel: Recent Labs    05/13/18 0137  NA 141  K 4.0  CL 107  CO2 26  GLUCOSE 101*  BUN 13  CREATININE 1.04  CALCIUM 9.0   Liver Function Tests: No results for input(s): AST, ALT, ALKPHOS, BILITOT, PROT, ALBUMIN in the last 72 hours. No results for input(s): LIPASE, AMYLASE in the last 72 hours. CBC: Recent Labs    05/13/18 0137  WBC 7.1  HGB 15.2  HCT 45.2  MCV 86.8  PLT 272   Cardiac Enzymes: Recent Labs    05/13/18 0137  TROPONINI <0.03   BNP: Invalid input(s): POCBNP D-Dimer: No results for input(s): DDIMER in the last 72 hours. Hemoglobin A1C: No results for input(s): HGBA1C in the last 72 hours. Fasting Lipid Panel: No results for input(s): CHOL, HDL, LDLCALC, TRIG, CHOLHDL, LDLDIRECT in the last 72 hours. Thyroid Function Tests: No  results for input(s): TSH, T4TOTAL, T3FREE, THYROIDAB in the last 72 hours.  Invalid input(s): FREET3 Anemia Panel: No results for input(s): VITAMINB12, FOLATE, FERRITIN, TIBC, IRON, RETICCTPCT in the last 72 hours.  Dg Chest Portable 1 View  Result Date: 05/13/2018 CLINICAL DATA:  Bradycardia and syncope EXAM: PORTABLE CHEST 1 VIEW COMPARISON:  01/22/2018 FINDINGS: The heart size and mediastinal contours are within normal limits. Both lungs are clear. The visualized skeletal structures are unremarkable. IMPRESSION: No active disease. Electronically Signed   By: Burman Nieves M.D.   On: 05/13/2018 02:17     Echo V EF 55 to 65%  TELEMETRY: Sinus bradycardia 43 bpm:  ASSESSMENT AND PLAN:  Active Problems:   Symptomatic bradycardia    1.  Symptomatic, marked sinus bradycardia with recent multiple admissions for presyncope, syncope and orthostatic lightheadedness  Recommendations  1.  Pacemaker implantation scheduled for 05/15/2018.  The risks, benefits, and alternatives of permanent pacemaker implantation were explained to the patient and informed consent was obtained.  Marcina Millard, MD, PhD, [facility] 05/14/2018 2:06 PM

## 2018-05-14 NOTE — Plan of Care (Signed)
  Problem: Education: Goal: Knowledge of General Education information will improve Description Including pain rating scale, medication(s)/side effects and non-pharmacologic comfort measures Outcome: Progressing   Problem: Clinical Measurements: Goal: Ability to maintain clinical measurements within normal limits will improve Outcome: Progressing   Problem: Activity: Goal: Risk for activity intolerance will decrease Outcome: Progressing   Problem: Safety: Goal: Ability to remain free from injury will improve Outcome: Progressing   Problem: Education: Goal: Knowledge of cardiac device and self-care will improve Outcome: Progressing Goal: Ability to safely manage health related needs after discharge will improve Outcome: Progressing Goal: Individualized Educational Video(s) Outcome: Progressing   Problem: Cardiac: Goal: Ability to achieve and maintain adequate cardiopulmonary perfusion will improve Outcome: Progressing

## 2018-05-14 NOTE — Plan of Care (Signed)
  Problem: Education: Goal: Knowledge of General Education information will improve Description: Including pain rating scale, medication(s)/side effects and non-pharmacologic comfort measures Outcome: Progressing   Problem: Clinical Measurements: Goal: Ability to maintain clinical measurements within normal limits will improve Outcome: Progressing   Problem: Safety: Goal: Ability to remain free from injury will improve Outcome: Progressing   

## 2018-05-14 NOTE — Progress Notes (Signed)
[facility] - [place] at [facility]   PATIENT NAME: Corey [name]    MR#:  [id number]  DATE OF BIRTH:  06-11-1986  SUBJECTIVE:  patient came in with recurrent syncopal episode. Found to have significant bradycardia. Heart rate still remains in the 50-60 no new symptoms at present  REVIEW OF SYSTEMS:   Review of Systems  Constitutional: Negative for chills, fever and weight loss.  HENT: Negative for ear discharge, ear pain and nosebleeds.   Eyes: Negative for blurred vision, pain and discharge.  Respiratory: Negative for sputum production, shortness of breath, wheezing and stridor.   Cardiovascular: Negative for chest pain, palpitations, orthopnea and PND.  Gastrointestinal: Negative for abdominal pain, diarrhea, nausea and vomiting.  Genitourinary: Negative for frequency and urgency.  Musculoskeletal: Negative for back pain and joint pain.  Neurological: Positive for weakness. Negative for sensory change, speech change and focal weakness.  Psychiatric/Behavioral: Negative for depression and hallucinations. The patient is not nervous/anxious.    Tolerating Diet:yes Tolerating PT: not needed  DRUG ALLERGIES:   Allergies  Allergen Reactions  . Penicillins Hives    Has patient had a PCN reaction causing immediate rash, facial/tongue/throat swelling, SOB or lightheadedness with hypotension: Yes Has patient had a PCN reaction causing severe rash involving mucus membranes or skin necrosis: No Has patient had a PCN reaction that required hospitalization: No Has patient had a PCN reaction occurring within the last 10 years: No If all of the above answers are "NO", then may proceed with Cephalosporin use.   . Sulfa Antibiotics Hives    VITALS:  Blood pressure (!) 103/52, pulse (!) 53, temperature 98.2 F (36.8 C), temperature source Oral, resp. rate 18, height 5\' 11"  (1.803 m), weight 115.3 kg, SpO2 98 %.  PHYSICAL EXAMINATION:   Physical Exam  GENERAL:   32 y.o.-year-old patient lying in the bed with no acute distress. Obese EYES: Pupils equal, round, reactive to light and accommodation. No scleral icterus. Extraocular muscles intact.  HEENT: Head atraumatic, normocephalic. Oropharynx and nasopharynx clear.  NECK:  Supple, no jugular venous distention. No thyroid enlargement, no tenderness.  LUNGS: Normal breath sounds bilaterally, no wheezing, rales, rhonchi. No use of accessory muscles of respiration.  CARDIOVASCULAR: S1, S2 normal. No murmurs, rubs, or gallops.  ABDOMEN: Soft, nontender, nondistended. Bowel sounds present. No organomegaly or mass.  EXTREMITIES: No cyanosis, clubbing or edema b/l.    NEUROLOGIC: Cranial nerves II through XII are intact. No focal Motor or sensory deficits b/l.   PSYCHIATRIC:  patient is alert and oriented x 3.  SKIN: No obvious rash, lesion, or ulcer.   LABORATORY PANEL:  CBC Recent Labs  Lab 05/13/18 0137  WBC 7.1  HGB 15.2  HCT 45.2  PLT 272    Chemistries  Recent Labs  Lab 05/13/18 0137  NA 141  K 4.0  CL 107  CO2 26  GLUCOSE 101*  BUN 13  CREATININE 1.04  CALCIUM 9.0   Cardiac Enzymes Recent Labs  Lab 05/13/18 0137  TROPONINI <0.03   RADIOLOGY:  Dg Chest Portable 1 View  Result Date: 05/13/2018 CLINICAL DATA:  Bradycardia and syncope EXAM: PORTABLE CHEST 1 VIEW COMPARISON:  01/22/2018 FINDINGS: The heart size and mediastinal contours are within normal limits. Both lungs are clear. The visualized skeletal structures are unremarkable. IMPRESSION: No active disease. Electronically Signed   By: Burman Nieves M.D.   On: 05/13/2018 02:17   ASSESSMENT AND PLAN:  Corey [name] is a 31 y.o. male brought to  the ED from home via EMS for recurrent syncope and bradycardia.  Patient has had almost weekly hospitalizations since the summertime for the above symptoms.  Scheduled for pacemaker placement on 10/16.  Reports he fell twice yesterday secondary to feeling lightheaded.  1.  symptomatic bradycardia recurrent causing lightheadedness and dizziness recurrent falls and several admissions for the same -also had Holter monitoring as outpatient which showed bradycardia. -Seen by cardiology Dr. Darrold Junker. Scheduled for pacemaker placement on 05/15/2018 -patient not on any rate blocking agent  2. bipolar disorder -continue Prozac and Depakote  3. DVT prophylaxis SCD, pt is ambulatory  D/w dr Cassie Freer  Case discussed with Care Management/Social Worker. Management plans discussed with the patient, family and they are in agreement.  CODE STATUS: full  DVT Prophylaxis: scd  TOTAL TIME TAKING CARE OF THIS PATIENT: *30* minutes.  >50% time spent on counselling and coordination of care  POSSIBLE D/C IN *1-2* DAYS, DEPENDING ON CLINICAL CONDITION.  Note: This dictation was prepared with Dragon dictation along with smaller phrase technology. Any transcriptional errors that result from this process are unintentional.  Enedina Finner M.D on 05/14/2018 at 12:22 PM  Between 7am to 6pm - Pager - [phone]  After 6pm go to [email or web address] - password [email or web address]  [facility] [place] [facility]  Office  [phone]  CC: Primary care physician; Patient, No Pcp PerPatient ID: Corey [name], male   DOB: [birth year], 32 y.o.   MRN: [id number]

## 2018-05-15 ENCOUNTER — Inpatient Hospital Stay: Payer: BLUE CROSS/BLUE SHIELD

## 2018-05-15 ENCOUNTER — Inpatient Hospital Stay: Payer: BLUE CROSS/BLUE SHIELD | Admitting: Anesthesiology

## 2018-05-15 ENCOUNTER — Encounter
Admission: EM | Disposition: A | Discharge status: Home / Self Care | Payer: Self-pay | Source: Home / Self Care | Attending: Internal Medicine

## 2018-05-15 ENCOUNTER — Encounter: Payer: Self-pay | Admitting: *Deleted

## 2018-05-15 DIAGNOSIS — R001 Bradycardia, unspecified: Secondary | ICD-10-CM

## 2018-05-15 HISTORY — PX: PACEMAKER INSERTION: SHX728

## 2018-05-15 HISTORY — DX: Bradycardia, unspecified: R00.1

## 2018-05-15 LAB — URINE DRUG SCREEN, QUALITATIVE (ARMC ONLY)
AMPHETAMINES, UR SCREEN: NOT DETECTED
BARBITURATES, UR SCREEN: NOT DETECTED
BENZODIAZEPINE, UR SCRN: POSITIVE — AB
COCAINE METABOLITE, UR ~~LOC~~: NOT DETECTED
Cannabinoid 50 Ng, Ur ~~LOC~~: POSITIVE — AB
MDMA (Ecstasy)Ur Screen: NOT DETECTED
METHADONE SCREEN, URINE: NOT DETECTED
Opiate, Ur Screen: NOT DETECTED
Phencyclidine (PCP) Ur S: NOT DETECTED
TRICYCLIC, UR SCREEN: NOT DETECTED

## 2018-05-15 LAB — PROTIME-INR
INR: 1.01
Prothrombin Time: 13.2 seconds (ref 11.4–15.2)

## 2018-05-15 LAB — GLUCOSE, CAPILLARY: Glucose-Capillary: 82 mg/dL (ref 70–99)

## 2018-05-15 LAB — APTT: aPTT: 29 seconds (ref 24–36)

## 2018-05-15 SURGERY — INSERTION, CARDIAC PACEMAKER
Anesthesia: Monitor Anesthesia Care | Site: Chest | Laterality: Left

## 2018-05-15 MED ORDER — ACETAMINOPHEN 325 MG PO TABS: 325.0000 mg | ORAL_TABLET | ORAL | Status: DC | PRN

## 2018-05-15 MED ORDER — ONDANSETRON HCL 4 MG/2ML IJ SOLN: 4.0000 mg | Freq: Four times a day (QID) | INTRAMUSCULAR | Status: DC | PRN

## 2018-05-15 MED ORDER — SODIUM CHLORIDE 0.9 % IV SOLN
80.0000 mg | INTRAVENOUS | Status: DC
  Filled 2018-05-15: qty 2

## 2018-05-15 MED ORDER — IOPAMIDOL (ISOVUE-300) INJECTION 61%
INTRAVENOUS | Status: DC | PRN
  Administered 2018-05-15: 40 mL via INTRAVENOUS

## 2018-05-15 MED ORDER — FENTANYL CITRATE (PF) 100 MCG/2ML IJ SOLN: 25.0000 ug | INTRAMUSCULAR | Status: DC | PRN

## 2018-05-15 MED ORDER — PROPOFOL 10 MG/ML IV BOLUS
INTRAVENOUS | Status: AC
  Filled 2018-05-15: qty 20

## 2018-05-15 MED ORDER — ONDANSETRON HCL 4 MG/2ML IJ SOLN: 4.0000 mg | Freq: Once | INTRAMUSCULAR | Status: DC | PRN

## 2018-05-15 MED ORDER — FENTANYL CITRATE (PF) 100 MCG/2ML IJ SOLN
INTRAMUSCULAR | Status: AC
  Filled 2018-05-15: qty 2

## 2018-05-15 MED ORDER — MIDAZOLAM HCL 2 MG/2ML IJ SOLN
INTRAMUSCULAR | Status: DC | PRN
  Administered 2018-05-15: 2 mg via INTRAVENOUS

## 2018-05-15 MED ORDER — PROPOFOL 500 MG/50ML IV EMUL
INTRAVENOUS | Status: AC
  Filled 2018-05-15: qty 50

## 2018-05-15 MED ORDER — LIDOCAINE 1 % OPTIME INJ - NO CHARGE
INTRAMUSCULAR | Status: DC | PRN
  Administered 2018-05-15: 30 mL

## 2018-05-15 MED ORDER — PROPOFOL 10 MG/ML IV BOLUS
INTRAVENOUS | Status: DC | PRN
  Administered 2018-05-15: 30 mg via INTRAVENOUS

## 2018-05-15 MED ORDER — VANCOMYCIN HCL IN DEXTROSE 1-5 GM/200ML-% IV SOLN
1000.0000 mg | Freq: Two times a day (BID) | INTRAVENOUS | Status: AC
  Administered 2018-05-15: 1000 mg via INTRAVENOUS
  Filled 2018-05-15: qty 200

## 2018-05-15 MED ORDER — LACTATED RINGERS IV SOLN: INTRAVENOUS | Status: DC

## 2018-05-15 MED ORDER — PROPOFOL 500 MG/50ML IV EMUL
INTRAVENOUS | Status: DC | PRN
  Administered 2018-05-15: 120 ug/kg/min via INTRAVENOUS

## 2018-05-15 MED ORDER — MIDAZOLAM HCL 2 MG/2ML IJ SOLN
INTRAMUSCULAR | Status: AC
  Filled 2018-05-15: qty 2

## 2018-05-15 MED ORDER — VANCOMYCIN HCL 10 G IV SOLR
1500.0000 mg | INTRAVENOUS | Status: AC
  Administered 2018-05-15: 1500 mg via INTRAVENOUS
  Filled 2018-05-15: qty 1500

## 2018-05-15 MED ORDER — SODIUM CHLORIDE 0.9 % IV SOLN
INTRAVENOUS | Status: DC | PRN
  Administered 2018-05-15: 120 mL

## 2018-05-15 MED ORDER — SODIUM CHLORIDE 0.9 % IV SOLN
INTRAVENOUS | Status: DC
  Administered 2018-05-15: 13:00:00 via INTRAVENOUS

## 2018-05-15 SURGICAL SUPPLY — 39 items
BAG DECANTER FOR FLEXI CONT (MISCELLANEOUS) ×3 IMPLANT
BLADE CLIPPER SURG (BLADE) ×3 IMPLANT
BRUSH SCRUB EZ  4% CHG (MISCELLANEOUS) ×2
BRUSH SCRUB EZ 4% CHG (MISCELLANEOUS) ×1 IMPLANT
CABLE SURG 12 DISP A/V CHANNEL (MISCELLANEOUS) ×3 IMPLANT
CANISTER SUCT 1200ML W/VALVE (MISCELLANEOUS) ×3 IMPLANT
CHLORAPREP W/TINT 26ML (MISCELLANEOUS) ×3 IMPLANT
COVER LIGHT HANDLE STERIS (MISCELLANEOUS) ×6 IMPLANT
COVER MAYO STAND STRL (DRAPES) ×3 IMPLANT
COVER WAND RF STERILE (DRAPES) ×3 IMPLANT
DRAPE C-ARM XRAY 36X54 (DRAPES) ×3 IMPLANT
DRSG TEGADERM 4X4.75 (GAUZE/BANDAGES/DRESSINGS) ×6 IMPLANT
DRSG TELFA 4X3 1S NADH ST (GAUZE/BANDAGES/DRESSINGS) ×3 IMPLANT
ELECT REM PT RETURN 9FT ADLT (ELECTROSURGICAL) ×3
ELECTRODE REM PT RTRN 9FT ADLT (ELECTROSURGICAL) ×1 IMPLANT
GLOVE BIO SURGEON STRL SZ7.5 (GLOVE) ×3 IMPLANT
GLOVE BIO SURGEON STRL SZ8 (GLOVE) ×3 IMPLANT
GOWN STRL REUS W/ TWL LRG LVL3 (GOWN DISPOSABLE) ×1 IMPLANT
GOWN STRL REUS W/ TWL XL LVL3 (GOWN DISPOSABLE) ×1 IMPLANT
GOWN STRL REUS W/TWL LRG LVL3 (GOWN DISPOSABLE) ×2
GOWN STRL REUS W/TWL XL LVL3 (GOWN DISPOSABLE) ×2
IMMOBILIZER SHDR MD LX WHT (SOFTGOODS) IMPLANT
IMMOBILIZER SHDR XL LX WHT (SOFTGOODS) ×3 IMPLANT
INTRO PACEMAKR LEAD 9FR 13CM (INTRODUCER) ×3
INTRO PACEMKR SHEATH II 7FR (MISCELLANEOUS) ×3
INTRODUCER PACEMKR LD 9FR 13CM (INTRODUCER) ×1 IMPLANT
INTRODUCER PACEMKR SHTH II 7FR (MISCELLANEOUS) ×1 IMPLANT
IPG PACE AZUR XT DR MRI W1DR01 (Pacemaker) ×1 IMPLANT
IV NS 500ML (IV SOLUTION)
IV NS 500ML BAXH (IV SOLUTION) IMPLANT
KIT TURNOVER KIT A (KITS) ×3 IMPLANT
LABEL OR SOLS (LABEL) ×3 IMPLANT
LEAD CAPSURE NOVUS 5076-52CM (Lead) ×3 IMPLANT
LEAD CAPSURE NOVUS 5076-58CM (Lead) ×3 IMPLANT
MARKER SKIN DUAL TIP RULER LAB (MISCELLANEOUS) ×3 IMPLANT
PACE AZURE XT DR MRI W1DR01 (Pacemaker) ×3 IMPLANT
PACK PACE INSERTION (MISCELLANEOUS) ×3 IMPLANT
PAD ONESTEP ZOLL R SERIES ADT (MISCELLANEOUS) ×3 IMPLANT
SUT SILK 0 SH 30 (SUTURE) ×3 IMPLANT

## 2018-05-15 NOTE — Progress Notes (Signed)
[facility] [place] at [facility]   PATIENT NAME: Corey [name]    MR#:  [id number]  DATE OF BIRTH:  [birth year]  SUBJECTIVE:  patient came in with recurrent syncopal episode. Found to have significant bradycardia. Heart rate still remains in the 50-60 no new symptoms at present  REVIEW OF SYSTEMS:   Review of Systems  Constitutional: Negative for chills, fever and weight loss.  HENT: Negative for ear discharge, ear pain and nosebleeds.   Eyes: Negative for blurred vision, pain and discharge.  Respiratory: Negative for sputum production, shortness of breath, wheezing and stridor.   Cardiovascular: Negative for chest pain, palpitations, orthopnea and PND.  Gastrointestinal: Negative for abdominal pain, diarrhea, nausea and vomiting.  Genitourinary: Negative for frequency and urgency.  Musculoskeletal: Negative for back pain and joint pain.  Neurological: Positive for weakness. Negative for sensory change, speech change and focal weakness.  Psychiatric/Behavioral: Negative for depression and hallucinations. The patient is not nervous/anxious.    Tolerating Diet:yes Tolerating PT: not needed  DRUG ALLERGIES:   Allergies  Allergen Reactions  . Penicillins Hives    Has patient had a PCN reaction causing immediate rash, facial/tongue/throat swelling, SOB or lightheadedness with hypotension: Yes Has patient had a PCN reaction causing severe rash involving mucus membranes or skin necrosis: No Has patient had a PCN reaction that required hospitalization: No Has patient had a PCN reaction occurring within the last 10 years: No If all of the above answers are "NO", then may proceed with Cephalosporin use.   . Sulfa Antibiotics Hives    VITALS:  Blood pressure 121/76, pulse (!) 46, temperature 98.4 F (36.9 C), temperature source Oral, resp. rate 18, height 5\' 11"  (1.803 m), weight 115.3 kg, SpO2 97 %.  PHYSICAL EXAMINATION:   Physical Exam  GENERAL:  32  y.o.-year-old patient lying in the bed with no acute distress. Obese EYES: Pupils equal, round, reactive to light and accommodation. No scleral icterus. Extraocular muscles intact.  HEENT: Head atraumatic, normocephalic. Oropharynx and nasopharynx clear.  NECK:  Supple, no jugular venous distention. No thyroid enlargement, no tenderness.  LUNGS: Normal breath sounds bilaterally, no wheezing, rales, rhonchi. No use of accessory muscles of respiration.  CARDIOVASCULAR: S1, S2 normal. No murmurs, rubs, or gallops.  ABDOMEN: Soft, nontender, nondistended. Bowel sounds present. No organomegaly or mass.  EXTREMITIES: No cyanosis, clubbing or edema b/l.    NEUROLOGIC: Cranial nerves II through XII are intact. No focal Motor or sensory deficits b/l.   PSYCHIATRIC:  patient is alert and oriented x 3.  SKIN: No obvious rash, lesion, or ulcer.   LABORATORY PANEL:  CBC Recent Labs  Lab 05/13/18 0137  WBC 7.1  HGB 15.2  HCT 45.2  PLT 272    Chemistries  Recent Labs  Lab 05/13/18 0137  NA 141  K 4.0  CL 107  CO2 26  GLUCOSE 101*  BUN 13  CREATININE 1.04  CALCIUM 9.0   Cardiac Enzymes Recent Labs  Lab 05/13/18 0137  TROPONINI <0.03   RADIOLOGY:  No results found. ASSESSMENT AND PLAN:  Corey [name] is a 32 y.o. male brought to the ED from home via EMS for recurrent syncope and bradycardia.  Patient has had almost weekly hospitalizations since the summertime for the above symptoms.  Scheduled for pacemaker placement on 10/16.  Reports he fell twice yesterday secondary to feeling lightheaded.  1. symptomatic bradycardia recurrent causing lightheadedness and dizziness recurrent falls and several admissions for the same -also had Holter  monitoring as outpatient which showed bradycardia. -Seen by cardiology Dr. Darrold Junker. Scheduled for pacemaker placement today 05/15/2018 -patient not on any rate blocking agent  2. bipolar disorder -continue Prozac and Depakote  3. DVT prophylaxis  SCD, pt is ambulatory  D/w dr Cassie Freer  Case discussed with Care Management/Social Worker. Management plans discussed with the patient, family and they are in agreement.  CODE STATUS: full  DVT Prophylaxis: scd  TOTAL TIME TAKING CARE OF THIS PATIENT: *30* minutes.  >50% time spent on counselling and coordination of care  POSSIBLE D/C IN *1-2* DAYS, DEPENDING ON CLINICAL CONDITION.  Note: This dictation was prepared with Dragon dictation along with smaller phrase technology. Any transcriptional errors that result from this process are unintentional.  Enedina Finner M.D on 05/15/2018 at 9:34 AM  Between 7am to 6pm - Pager - [phone]  After 6pm go to [email or web address] - password [email or web address]  [facility]  Office  [phone]  CC: Primary care physician; Patient, No Pcp PerPatient ID: Corey [name], male   DOB: 1986-02-15, 32 y.o.   MRN: [id number]

## 2018-05-15 NOTE — Anesthesia Preprocedure Evaluation (Signed)
Anesthesia Evaluation  Patient identified by MRN, date of birth, ID band Patient awake    Reviewed: Allergy & Precautions, H&P , NPO status , Patient's Chart, lab work & pertinent test results, reviewed documented beta blocker date and time   Airway Mallampati: III  TM Distance: >3 FB Neck ROM: full    Dental no notable dental hx. (+) Teeth Intact   Pulmonary neg pulmonary ROS, former smoker,    Pulmonary exam normal breath sounds clear to auscultation       Cardiovascular Exercise Tolerance: Good hypertension, On Medications + dysrhythmias  Rhythm:regular Rate:Normal     Neuro/Psych negative neurological ROS  negative psych ROS   GI/Hepatic Neg liver ROS, GERD  Medicated,  Endo/Other  negative endocrine ROSdiabetes, Well Controlled, Type 2, Oral Hypoglycemic Agents  Renal/GU CRFRenal disease     Musculoskeletal   Abdominal   Peds  Hematology negative hematology ROS (+)   Anesthesia Other Findings   Reproductive/Obstetrics negative OB ROS                             Anesthesia Physical Anesthesia Plan  ASA: IV  Anesthesia Plan: MAC   Post-op Pain Management:    Induction:   PONV Risk Score and Plan:   Airway Management Planned:   Additional Equipment:   Intra-op Plan:   Post-operative Plan:   Informed Consent: I have reviewed the patients History and Physical, chart, labs and discussed the procedure including the risks, benefits and alternatives for the proposed anesthesia with the patient or authorized representative who has indicated his/her understanding and acceptance.     Plan Discussed with: CRNA  Anesthesia Plan Comments:         Anesthesia Quick Evaluation

## 2018-05-15 NOTE — Anesthesia Post-op Follow-up Note (Signed)
Anesthesia QCDR form completed.        

## 2018-05-15 NOTE — Op Note (Signed)
[facility]   05/15/2018                     2:15 PM  PATIENT:  Corey [name]    PRE-OPERATIVE DIAGNOSIS:  sick sinus syndrome  POST-OPERATIVE DIAGNOSIS:  Same  PROCEDURE:  INSERTION PACEMAKER  SURGEON:  Marcina Millard, MD    ANESTHESIA:     PREOPERATIVE INDICATIONS:  Corey [name] is a  32 y.o. male with a diagnosis of sick sinus syndrome who failed conservative measures and elected for surgical management.    The risks benefits and alternatives were discussed with the patient preoperatively including but not limited to the risks of infection, bleeding, cardiopulmonary complications, the need for revision surgery, among others, and the patient was willing to proceed.   OPERATIVE PROCEDURE: The patient was brought to the operating room in a fasting state.  The left pectoral region was prepped and draped in usual sterile manner.  Anesthesia was obtained 1% lidocaine locally.  A 6 cm incision was performed to the left pectoral region.  The pacemaker pocket was generated by electrocautery and blunt dissection.  Access was obtained to the left subclavian vein by fine-needle aspiration.  MRI compatible leads were positioned to the right ventricular apical septum ( Medtronic [id number] ) and right atrial appendage ( Medtronic [id number] ) under fluoroscopic guidance.  After proper thresholds were obtained the leads were sutured in place with 0 silk.  The leads were connected to a MRI compatible rate responsive dual chamber pacemaker generator ( Medtronic [id number] ).  The pacemaker pocket was irrigated with gentamicin solution.  The pacemaker generator was positioned into the pocket and the pocket was closed with 2-0 and 4-0 Vicryl, respectively.  Steri-Strips or pressure dressing were applied.  There were no periprocedural complications.  Postprocedural pacemaker  interrogation revealed appropriate dual-chamber atrial and ventricular sensing and pacing thresholds.

## 2018-05-15 NOTE — Transfer of Care (Signed)
Immediate Anesthesia Transfer of Care Note  Patient: Corey [name]  Procedure(s) Performed: INSERTION PACEMAKER (Left Chest)  Patient Location: PACU  Anesthesia Type:General  Level of Consciousness: awake, alert  and oriented  Airway & Oxygen Therapy: Patient Spontanous Breathing and Patient connected to nasal cannula oxygen  Post-op Assessment: Report given to RN and Post -op Vital signs reviewed and stable  Post vital signs: Reviewed and stable  Last Vitals:  Vitals Value Taken Time  BP 157/69 05/15/2018  2:10 PM  Temp 36.8 C 05/15/2018  2:10 PM  Pulse 65 05/15/2018  2:11 PM  Resp 13 05/15/2018  2:11 PM  SpO2 94 % 05/15/2018  2:11 PM  Vitals shown include unvalidated device data.  Last Pain:  Vitals:   05/15/18 1410  TempSrc:   PainSc: 0-No pain         Complications: No apparent anesthesia complications

## 2018-05-15 NOTE — Telephone Encounter (Signed)
Attempted to contact patient, no answer and unable to leave message.  Corey [name]. Hassan Rowan, MSW, Theresia Majors [phone]  05/15/2018 12:33 PM

## 2018-05-15 NOTE — Progress Notes (Signed)
Blood pressure 167/82   Dr Pernell Dupre aware   No new orders

## 2018-05-16 ENCOUNTER — Encounter: Payer: Self-pay | Admitting: Cardiology

## 2018-05-16 LAB — GLUCOSE, CAPILLARY: Glucose-Capillary: 87 mg/dL (ref 70–99)

## 2018-05-16 MED ORDER — CLARITHROMYCIN 250 MG PO TABS: 250.0000 mg | ORAL_TABLET | Freq: Two times a day (BID) | ORAL | 0 refills | Status: AC

## 2018-05-16 NOTE — Progress Notes (Signed)
[facility]  SUBJECTIVE: Patient states he feels "100% better." The patient reports that he slept a little last night. He denies chest pain or shortness of breath. He does have tenderness at the incision site status post pacemaker implantation yesterday.    Vitals:   05/15/18 1533 05/15/18 1726 05/15/18 1942 05/16/18 0435  BP: (!) 145/97 137/88 125/85 (!) 136/94  Pulse: (!) 59 (!) 58 61 (!) 59  Resp: 18 18 18 18   Temp: (!) 97.5 F (36.4 C) (!) 97.4 F (36.3 C) 98.2 F (36.8 C) 98.5 F (36.9 C)  TempSrc: Oral Oral Oral   SpO2: 98% 96% 98% 97%  Weight:      Height:         Intake/Output Summary (Last 24 hours) at 05/16/2018 1478 Last data filed at 05/16/2018 0500 Gross per 24 hour  Intake 794.26 ml  Output 1100 ml  Net -305.74 ml      PHYSICAL EXAM  General: Well developed, well nourished, in no acute distress HEENT:  Normocephalic and atramatic Neck:  No JVD.  Lungs: normal effort of breathing on room air. Heart: HRRR . Normal S1 and S2 without gallops or murmurs.  Chest wall: Left upper chest wall. Removed tegaderm which had moderate amount of stained bright red blood, no active bleeding visible. Steri strips intact with dried bright red blood along incision line without active bleeding. No erythema or edema of incision site.   Abdomen: nondistended Msk:  Normal strength and tone for age. Extremities: No clubbing, cyanosis or edema.   Neuro: Alert and oriented X 3. Psych:  Good affect, responds appropriately   LABS: Basic Metabolic Panel: No results for input(s): NA, K, CL, CO2, GLUCOSE, BUN, CREATININE, CALCIUM, MG, PHOS in the last 72 hours. Liver Function Tests: No results for input(s): AST, ALT, ALKPHOS, BILITOT, PROT, ALBUMIN in the last 72 hours. No results for input(s): LIPASE, AMYLASE in the last 72 hours. CBC: No results for input(s): WBC, NEUTROABS, HGB, HCT, MCV, PLT in the last 72 hours. Cardiac Enzymes: No results for input(s): CKTOTAL, CKMB,  CKMBINDEX, TROPONINI in the last 72 hours. BNP: Invalid input(s): POCBNP D-Dimer: No results for input(s): DDIMER in the last 72 hours. Hemoglobin A1C: No results for input(s): HGBA1C in the last 72 hours. Fasting Lipid Panel: No results for input(s): CHOL, HDL, LDLCALC, TRIG, CHOLHDL, LDLDIRECT in the last 72 hours. Thyroid Function Tests: No results for input(s): TSH, T4TOTAL, T3FREE, THYROIDAB in the last 72 hours.  Invalid input(s): FREET3 Anemia Panel: No results for input(s): VITAMINB12, FOLATE, FERRITIN, TIBC, IRON, RETICCTPCT in the last 72 hours.  Dg Chest Port 1 View  Result Date: 05/15/2018 CLINICAL DATA:  Status post pacemaker placement. EXAM: PORTABLE CHEST 1 VIEW COMPARISON:  Radiograph of May 13, 2018. FINDINGS: The heart size and mediastinal contours are within normal limits. Hypoinflation of the lungs. Interval placement of left-sided pacemaker with leads in grossly good position. Left lung is clear. Mild right upper lobe subsegmental atelectasis is noted. No pneumothorax or pleural effusion is noted. The visualized skeletal structures are unremarkable. IMPRESSION: Interval placement of left-sided pacemaker with leads in grossly good position. No pneumothorax or pleural effusion is noted. Mild right upper lobe subsegmental atelectasis. Electronically Signed   By: Lupita Raider, M.D.   On: 05/15/2018 14:52   Dg C-arm 1-60 Min-no Report  Result Date: 05/15/2018 Fluoroscopy was utilized by the requesting physician.  No radiographic interpretation.    TELEMETRY: atrial paced rhythm at a rate of 60 bpm  ASSESSMENT AND PLAN:  Active Problems:   Symptomatic bradycardia    1. Sick sinus syndrome, symptomatic with recurrent dizziness and presyncope, status post single chamber pacemaker insertion on 05/15/18, which was without perioperative complications. Chest xray negative for pneumothorax, with grossly normal position of pacemaker. ECG reveals atrial paced rhythm at  a rate of 60 bpm. Patient reports feeling "100% better."   Recommendations: 1. Patient may be discharged from cardiac perspective. 2. Prescription for Biaxin 250 mg BID x 7 days printed and placed in patient's chart for discharge. 3. Follow-up with Dr. Darrold Junker in 10 days as outpatient. 4. Advised patient to avoid lifting greater than 15 pounds or lifting his left arm above his head for the next 6 weeks. He may shower tomorrow, avoiding direct contact with water to his incision site. Advised patient not to return to work until after his follow-up appointment in 10 days. Outer tegaderm removed. Advised patient to leave steri strips alone; they will be removed as outpatient.  Leanora Ivanoff, PA-C 05/16/2018 8:11 AM

## 2018-05-16 NOTE — Discharge Instructions (Signed)
Advised patient to avoid lifting greater than 15 pounds or lifting his left arm above his head for the next 6 weeks. He may shower tomorrow, avoiding direct contact with water to his incision site. Advised patient not to return to work until after his follow-up appointment in 10 days.

## 2018-05-16 NOTE — Progress Notes (Signed)
Discharge instructions explained to pt/ verbalized an understanding/ iv and tele removed/ RX given to pt/ transported off unit via wheelchair.  

## 2018-05-16 NOTE — Anesthesia Postprocedure Evaluation (Signed)
Anesthesia Post Note  Patient: TEIGEN BELLIN  Procedure(s) Performed: INSERTION PACEMAKER (Left Chest)  Patient location during evaluation: PACU Anesthesia Type: MAC Level of consciousness: awake and alert Pain management: pain level controlled Vital Signs Assessment: post-procedure vital signs reviewed and stable Respiratory status: spontaneous breathing, nonlabored ventilation, respiratory function stable and patient connected to nasal cannula oxygen Cardiovascular status: blood pressure returned to baseline and stable Postop Assessment: no apparent nausea or vomiting Anesthetic complications: no     Last Vitals:  Vitals:   05/16/18 0435 05/16/18 0813  BP: (!) 136/94 (!) 134/92  Pulse: (!) 59 (!) 59  Resp: 18   Temp: 36.9 C 36.4 C  SpO2: 97% 98%    Last Pain:  Vitals:   05/16/18 0813  TempSrc: Oral  PainSc:                  Molli Barrows

## 2018-05-16 NOTE — Discharge Summary (Signed)
[facility] [place] [facility]   PATIENT NAME: Corey [name]    MR#:  [id number]  DATE OF BIRTH:  09/19/1985  DATE OF ADMISSION:  05/13/2018 ADMITTING PHYSICIAN: Barbaraann Rondo, MD  DATE OF DISCHARGE: 05/16/2018  PRIMARY CARE PHYSICIAN: Corey [name]    ADMISSION DIAGNOSIS:  Syncope and collapse [R55] Bradycardia [R00.1] Orthostasis [I95.1]  DISCHARGE DIAGNOSIS:  Sick Sinus Syndrome--s/p Pacemaker placement  SECONDARY DIAGNOSIS:   Past Medical History:  Diagnosis Date  . Bipolar 1 disorder (HCC)   . GERD (gastroesophageal reflux disease)   . HTN (hypertension)     HOSPITAL COURSE:   Corey Bruun Burnsis a 32 y.o.malebrought to the ED from home via EMS for recurrent syncope and bradycardia. Patient has had almost weekly hospitalizations since the summertime for the above symptoms. Scheduled for pacemaker placement on 10/16. Reports he fell twice yesterday secondary to feeling lightheaded.  1. symptomatic bradycardia recurrent causing lightheadedness and dizziness recurrent falls and several admissions for the same -also had Holter monitoring as outpatient which showed bradycardia. -Seen by cardiology Dr. Darrold Junker. s/p pacemaker placement 05/15/2018. -patient not on any rate blocking agent -Biaxin [name] cardiology for 7 days -d/c midodrine since BP now stable  2.Bipolar disorder -continue Prozac and Depakote  3. DVT prophylaxis SCD, pt is ambulatory  Overall stable. Ambulate in the hallways.  CONSULTS OBTAINED:  Treatment Team:  Marcina Millard, MD  DRUG ALLERGIES:   Allergies  Allergen Reactions  . Penicillins Hives    Has patient had a PCN reaction causing immediate rash, facial/tongue/throat swelling, SOB or lightheadedness with hypotension: Yes Has patient had a PCN reaction causing severe rash involving mucus membranes or skin necrosis: No Has patient had a PCN reaction that required hospitalization: No Has  patient had a PCN reaction occurring within the last 10 years: No If all of the above answers are "NO", then may proceed with Cephalosporin use.   . Sulfa Antibiotics Hives    DISCHARGE MEDICATIONS:   Allergies as of 05/16/2018      Reactions   Penicillins Hives   Has patient had a PCN reaction causing immediate rash, facial/tongue/throat swelling, SOB or lightheadedness with hypotension: Yes Has patient had a PCN reaction causing severe rash involving mucus membranes or skin necrosis: No Has patient had a PCN reaction that required hospitalization: No Has patient had a PCN reaction occurring within the last 10 years: No If all of the above answers are "NO", then may proceed with Cephalosporin use.   Sulfa Antibiotics Hives      Medication List    STOP taking these medications   midodrine 2.5 MG tablet Commonly known as:  PROAMATINE     TAKE these medications   clarithromycin 250 MG tablet Commonly known as:  BIAXIN Take 1 tablet (250 mg total) by mouth 2 (two) times daily for 7 days.   divalproex 500 MG 24 hr tablet Commonly known as:  DEPAKOTE ER Take 500 mg by mouth at bedtime.   FLUoxetine 40 MG capsule Commonly known as:  PROZAC Take 40 mg by mouth daily.   pantoprazole 40 MG tablet Commonly known as:  PROTONIX Take 1 tablet (40 mg total) by mouth daily.   sucralfate 1 g tablet Commonly known as:  CARAFATE Take 1 tablet (1 g total) by mouth 4 (four) times daily.       If you experience worsening of your admission symptoms, develop shortness of breath, life threatening emergency, suicidal or homicidal thoughts you must  seek medical attention immediately by calling 911 or calling your MD immediately  if symptoms less severe.  You Must read complete instructions/literature along with all the possible adverse reactions/side effects for all the Medicines you take and that have been prescribed to you. Take any new Medicines after you have completely understood and  accept all the possible adverse reactions/side effects.   Please note  You were cared for by a hospitalist during your hospital stay. If you have any questions about your discharge medications or the care you received while you were in the hospital after you are discharged, you can call the unit and asked to speak with the hospitalist on call if the hospitalist that took care of you is not available. Once you are discharged, your primary care physician will handle any further medical issues. Please note that NO REFILLS for any discharge medications will be authorized once you are discharged, as it is imperative that you return to your primary care physician (or establish a relationship with a primary care physician if you do not have one) for your aftercare needs so that they can reassess your need for medications and monitor your lab values. Today   SUBJECTIVE   Doing well  VITAL SIGNS:  Blood pressure (!) 134/92, pulse (!) 59, temperature 97.6 F (36.4 C), temperature source Oral, resp. rate 18, height 5\' 11"  (1.803 m), weight 115.2 kg, SpO2 98 %.  I/O:    Intake/Output Summary (Last 24 hours) at 05/16/2018 0915 Last data filed at 05/16/2018 0500 Gross [name] 24 hour  Intake 794.26 ml  Output 1100 ml  Net -305.74 ml    PHYSICAL EXAMINATION:  GENERAL:  32 y.o.-year-old patient lying in the bed with no acute distress.  EYES: Pupils equal, round, reactive to light and accommodation. No scleral icterus. Extraocular muscles intact.  HEENT: Head atraumatic, normocephalic. Oropharynx and nasopharynx clear.  NECK:  Supple, no jugular venous distention. No thyroid enlargement, no tenderness.  LUNGS: Normal breath sounds bilaterally, no wheezing, rales,rhonchi or crepitation. No use of accessory muscles of respiration.  CARDIOVASCULAR: S1, S2 normal. No murmurs, rubs, or gallops. PM site looks ok ABDOMEN: Soft, non-tender, non-distended. Bowel sounds present. No organomegaly or mass.   EXTREMITIES: No pedal edema, cyanosis, or clubbing.  NEUROLOGIC: Cranial nerves II through XII are intact. Muscle strength 5/5 in all extremities. Sensation intact. Gait not checked.  PSYCHIATRIC: The patient is alert and oriented x 3.  SKIN: No obvious rash, lesion, or ulcer.   DATA REVIEW:   CBC  Recent Labs  Lab 05/13/18 0137  WBC 7.1  HGB 15.2  HCT 45.2  PLT 272    Chemistries  Recent Labs  Lab 05/13/18 0137  NA 141  K 4.0  CL 107  CO2 26  GLUCOSE 101*  BUN 13  CREATININE 1.04  CALCIUM 9.0    Microbiology Results   Recent Results (from the past 240 hour(s))  Surgical pcr screen     Status: None   Collection Time: 05/12/18 12:26 PM  Result Value Ref Range Status   MRSA, PCR NEGATIVE NEGATIVE Final   Staphylococcus aureus NEGATIVE NEGATIVE Final    Comment: (NOTE) The Xpert SA Assay (FDA approved for NASAL specimens in patients 29 years of age and older), is one component of a comprehensive surveillance program. It is not intended to diagnose infection nor to guide or monitor treatment. Performed at [facility], [address], [place], [place] [address]     RADIOLOGY:  Dg Chest  Port 1 View  Result Date: 05/15/2018 CLINICAL DATA:  Status post pacemaker placement. EXAM: PORTABLE CHEST 1 VIEW COMPARISON:  Radiograph of May 13, 2018. FINDINGS: The heart size and mediastinal contours are within normal limits. Hypoinflation of the lungs. Interval placement of left-sided pacemaker with leads in grossly good position. Left lung is clear. Mild right upper lobe subsegmental atelectasis is noted. No pneumothorax or pleural effusion is noted. The visualized skeletal structures are unremarkable. IMPRESSION: Interval placement of left-sided pacemaker with leads in grossly good position. No pneumothorax or pleural effusion is noted. Mild right upper lobe subsegmental atelectasis. Electronically Signed   By: Lupita Raider, M.D.   On: 05/15/2018 14:52   Dg  C-arm 1-60 Min-no Report  Result Date: 05/15/2018 Fluoroscopy was utilized by the requesting physician.  No radiographic interpretation.     Management plans discussed with the patient, family and they are in agreement.  CODE STATUS:     Code Status Orders  (From admission, onward)         Start     Ordered   05/13/18 0449  Full code  Continuous     05/13/18 0448        Code Status History    Date Active Date Inactive Code Status Order ID Comments User Context   05/04/2018 2344 05/06/2018 2022 Full Code [id number]  Oralia Manis, MD ED   04/29/2018 0954 05/02/2018 1907 Full Code [id number]  Barbaraann Rondo, MD Inpatient   04/24/2018 0646 04/26/2018 2146 Full Code [id number]  Arnaldo Natal, MD Inpatient   03/26/2018 1507 03/27/2018 1748 Full Code [id number]  Auburn Bilberry, MD Inpatient      TOTAL TIME TAKING CARE OF THIS PATIENT: *40* minutes.    Enedina Finner M.D on 05/16/2018 at 9:15 AM  Between 7am to 6pm - Pager - [phone] After 6pm go to [email or web address] - password [email or web address]  [facility]  Office  [phone]  CC: Primary care physician; Corey [name]

## 2018-05-17 ENCOUNTER — Ambulatory Visit: Admission: RE | Admit: 2018-05-17 | Payer: BLUE CROSS/BLUE SHIELD | Source: Ambulatory Visit | Admitting: Cardiology

## 2018-05-17 ENCOUNTER — Encounter: Admission: RE | Payer: Self-pay | Source: Ambulatory Visit

## 2018-05-17 SURGERY — INSERTION, CARDIAC PACEMAKER
Anesthesia: Choice

## 2018-05-18 ENCOUNTER — Ambulatory Visit: Payer: BLUE CROSS/BLUE SHIELD | Admitting: Urology

## 2018-05-23 ENCOUNTER — Other Ambulatory Visit: Payer: Self-pay

## 2018-05-23 ENCOUNTER — Emergency Department: Payer: BLUE CROSS/BLUE SHIELD

## 2018-05-23 ENCOUNTER — Emergency Department
Admission: EM | Admit: 2018-05-23 | Discharge: 2018-05-24 | Disposition: A | Discharge status: Home / Self Care | Payer: BLUE CROSS/BLUE SHIELD | Attending: Emergency Medicine | Admitting: Emergency Medicine

## 2018-05-23 DIAGNOSIS — F17220 Nicotine dependence, chewing tobacco, uncomplicated: Secondary | ICD-10-CM | POA: Insufficient documentation

## 2018-05-23 DIAGNOSIS — I10 Essential (primary) hypertension: Secondary | ICD-10-CM | POA: Insufficient documentation

## 2018-05-23 DIAGNOSIS — R42 Dizziness and giddiness: Secondary | ICD-10-CM

## 2018-05-23 DIAGNOSIS — E861 Hypovolemia: Secondary | ICD-10-CM | POA: Diagnosis not present

## 2018-05-23 DIAGNOSIS — I951 Orthostatic hypotension: Secondary | ICD-10-CM | POA: Diagnosis not present

## 2018-05-23 DIAGNOSIS — Z79899 Other long term (current) drug therapy: Secondary | ICD-10-CM | POA: Diagnosis not present

## 2018-05-23 DIAGNOSIS — Z950 Presence of cardiac pacemaker: Secondary | ICD-10-CM | POA: Insufficient documentation

## 2018-05-23 DIAGNOSIS — I9589 Other hypotension: Secondary | ICD-10-CM

## 2018-05-23 HISTORY — DX: Bradycardia, unspecified: R00.1

## 2018-05-23 LAB — COMPREHENSIVE METABOLIC PANEL
ALT: 24 U/L (ref 0–44)
ANION GAP: 8 (ref 5–15)
AST: 22 U/L (ref 15–41)
Albumin: 3.5 g/dL (ref 3.5–5.0)
Alkaline Phosphatase: 67 U/L (ref 38–126)
BUN: 11 mg/dL (ref 6–20)
CHLORIDE: 111 mmol/L (ref 98–111)
CO2: 23 mmol/L (ref 22–32)
Calcium: 8.4 mg/dL — ABNORMAL LOW (ref 8.9–10.3)
Creatinine, Ser: 0.95 mg/dL (ref 0.61–1.24)
GFR calc non Af Amer: >60 mL/min (ref >60)
Glucose, Bld: 139 mg/dL — ABNORMAL HIGH (ref 70–99)
POTASSIUM: 3.7 mmol/L (ref 3.5–5.1)
SODIUM: 142 mmol/L (ref 135–145)
Total Bilirubin: 0.6 mg/dL (ref 0.3–1.2)
Total Protein: 6.4 g/dL — ABNORMAL LOW (ref 6.5–8.1)

## 2018-05-23 LAB — CBC WITH DIFFERENTIAL/PLATELET
Abs Immature Granulocytes: 0.02 10*3/uL (ref 0.00–0.07)
BASOS ABS: 0 10*3/uL (ref 0.0–0.1)
Basophils Relative: 1 %
EOS PCT: 7 %
Eosinophils Absolute: 0.5 10*3/uL (ref 0.0–0.5)
HCT: 41.6 % (ref 39.0–52.0)
HEMOGLOBIN: 14 g/dL (ref 13.0–17.0)
Immature Granulocytes: 0 %
LYMPHS PCT: 21 %
Lymphs Abs: 1.5 10*3/uL (ref 0.7–4.0)
MCH: 29.2 pg (ref 26.0–34.0)
MCHC: 33.7 g/dL (ref 30.0–36.0)
MCV: 86.8 fL (ref 80.0–100.0)
Monocytes Absolute: 0.3 10*3/uL (ref 0.1–1.0)
Monocytes Relative: 5 %
NEUTROS ABS: 4.9 10*3/uL (ref 1.7–7.7)
NRBC: 0 % (ref 0.0–0.2)
Neutrophils Relative %: 66 %
PLATELETS: 229 10*3/uL (ref 150–400)
RBC: 4.79 MIL/uL (ref 4.22–5.81)
RDW: 12.5 % (ref 11.5–15.5)
WBC: 7.3 10*3/uL (ref 4.0–10.5)

## 2018-05-23 LAB — TROPONIN I

## 2018-05-23 MED ORDER — SODIUM CHLORIDE 0.9 % IV SOLN
Freq: Once | INTRAVENOUS | Status: AC
  Administered 2018-05-23: 22:00:00 via INTRAVENOUS

## 2018-05-23 MED ORDER — SODIUM CHLORIDE 0.9 % IV BOLUS
1000.0000 mL | Freq: Once | INTRAVENOUS | Status: AC
  Administered 2018-05-24: 1000 mL via INTRAVENOUS

## 2018-05-23 NOTE — ED Provider Notes (Signed)
[facility] Emergency Department Provider Note  ____________________________________________   First MD Initiated Contact with Patient 05/23/18 2258     (approximate)  I have reviewed the triage vital signs and the nursing notes.   HISTORY  Chief Complaint No chief complaint on file.    HPI Corey [name] is a 32 y.o. 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  He presents tonight by EMS for feeling very lightheaded and dizzy.  He has felt very tired and weak over the course of the day.  Then he started developing lightheadedness particularly when he stands or tries to move around but even when he is lying flat.  He says he feels the same way he did earlier when he was hypotensive.  He checked his blood pressure and found that his blood pressure was quite low which has not been the case for the last 8 days since getting his pacemaker.  He reports that his heart rate has been steady at around 60 as it is supposed to be but he does not understand why he is hypotensive.  He denies fever/chills, chest pain, shortness of breath, nausea, vomiting, abdominal pain, and dysuria.  He is not on any blood pressure medicine and is on no diuretics.  He describes the symptoms as severe and nothing in particular makes them better.  He received a 500 mL normal saline bolus prior to arrival and the bolus completed after he got to the ED.  He is lying in the bed in mild Trendelenburg position but still feels a little bit symptomatic at this time although the symptoms are only mild.  He has had no numbness, tingling, nor weakness in any of his extremities.  Past Medical History:  Diagnosis Date  . Bipolar 1 disorder (HCC)   . GERD (gastroesophageal reflux disease)   . HTN (hypertension)   . Symptomatic bradycardia  05/15/2018   pacemaker installed by Dr. Darrold Junker    Patient Active Problem List   Diagnosis Date Noted  . Bipolar 1 disorder (HCC) 05/04/2018  . Syncope and collapse 04/29/2018  . Hypotension 04/29/2018  . Bradycardia 04/24/2018  . 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 Kidney stone 03/26/2018    Past Surgical History:  Procedure Laterality Date  . ESOPHAGOGASTRODUODENOSCOPY (EGD) WITH PROPOFOL N/A 04/26/2018   Procedure: ESOPHAGOGASTRODUODENOSCOPY (EGD) WITH PROPOFOL;  Surgeon: Wyline Mood, MD;  Location[facility] ENDOSCOPY;  Service: Gastroenterology;  Laterality: N/A;  . KNEE ARTHROSCOPY    . PACEMAKER INSERTION Left 05/15/2018   Procedure: INSERTION PACEMAKER;  Surgeon: Marcina Millard, MD;  Location: [facility] ORS;  Service: Cardiovascular;  Laterality: Left;    Prior to Admission medications   Medication Sig Start Date End Date Taking? Authorizing Provider  divalproex (DEPAKOTE ER) 500 MG 24 hr tablet Take 500 mg by mouth at bedtime.  04/24/18   [provider]  FLUoxetine (PROZAC) 40 MG capsule Take 40 mg by mouth daily. 02/28/18   [provider]  pantoprazole (PROTONIX) 40 MG tablet Take 1 tablet (40 mg total) by mouth daily. 03/27/18 05/26/18  Houston Siren, MD  sucralfate (CARAFATE) 1 g tablet Take 1 tablet (1 g total) by mouth 4 (four) times daily. 04/01/18   Sharman Cheek, MD    Allergies Penicillins and Sulfa antibiotics  Family History  Problem Relation Age of Onset  . Kidney cancer  Neg Hx   . Bladder Cancer Neg Hx   . Prostate cancer Neg Hx     Social History Social History   Tobacco Use  . Smoking status: Former Games developer  . Smokeless tobacco: Current User    Types: Snuff  Substance Use Topics  . Alcohol use: No  . Drug use: Not Currently    Types: Marijuana    Review of Systems Constitutional: No fever/chills.  Generalized malaise and weakness. Eyes: No visual changes. ENT: No sore throat. Cardiovascular: Denies chest pain.  No  syncope.  Dizziness/lightheadedness even at rest but particularly upon standing as described above. Respiratory: Denies shortness of breath. Gastrointestinal: No abdominal pain.  No nausea, no vomiting.  No diarrhea.  No constipation. Genitourinary: Negative for dysuria. Musculoskeletal: Negative for neck pain.  Negative for back pain. Integumentary: Negative for rash. Neurological: Negative for headaches, focal weakness or numbness.   ____________________________________________   PHYSICAL EXAM:  VITAL SIGNS: ED Triage Vitals  Enc Vitals Group     BP 05/23/18 2121 (!) 83/59     Pulse Rate 05/23/18 2121 66     Resp 05/23/18 2121 12     Temp 05/23/18 2121 98.3 F (36.8 C)     Temp Source 05/23/18 2121 Oral     SpO2 05/23/18 2121 97 %     Weight 05/23/18 2122 115.2 kg (254 lb)     Height 05/23/18 2122 1.803 m (5\' 11" )     Head Circumference --      Peak Flow --      Pain Score 05/23/18 2122 0     Pain Loc --      Pain Edu? --      Excl. in GC? --     Constitutional: Alert and oriented. Well appearing and in no acute distress. Eyes: Conjunctivae are normal.  Head: Atraumatic. Nose: No congestion/rhinnorhea. Mouth/Throat: Mucous membranes are moist. Neck: No stridor.  No meningeal signs.   Cardiovascular: Normal rate, regular rhythm. Good peripheral circulation. Grossly normal heart sounds.  Left-sided pacemaker is in place and appears to be functioning well with pacer spikes on the monitor and a steady heart rate at around 60. Respiratory: Normal respiratory effort.  No retractions. Lungs CTAB. Gastrointestinal: Soft and nontender. No distention.  Musculoskeletal: No lower extremity tenderness nor edema. No gross deformities of extremities. Neurologic:  Normal speech and language. No gross focal neurologic deficits are appreciated.  Skin:  Skin is warm, dry and intact. No rash noted. Psychiatric: Mood and affect are normal. Speech and behavior are  normal.  ____________________________________________   LABS (all labs ordered are listed, but only abnormal results are displayed)  Labs Reviewed  COMPREHENSIVE METABOLIC PANEL - Abnormal; Notable for the following components:      Result Value   Glucose, Bld 139 (*)    Calcium 8.4 (*)    Total Protein 6.4 (*)    All other components within normal limits  CBC WITH DIFFERENTIAL/PLATELET  TROPONIN I   ____________________________________________  EKG  ED ECG REPORT I, Loleta Rose, the attending physician, personally viewed and interpreted this ECG.  Date: 05/23/2018 EKG Time: 21:20 Rate: 66 Rhythm: normal sinus rhythm QRS Axis: borderline LAD Intervals: normal ST/T Wave abnormalities: Mild non-specific ST segment / T-wave changes, but no evidence of acute ischemia. Narrative Interpretation: no evidence of acute ischemia   ____________________________________________  RADIOLOGY I, Loleta Rose, personally viewed and evaluated these images (plain radiographs) as part of my medical decision making, as well as reviewing the  written report by the radiologist.  ED MD interpretation:  L-sided pacemaker, no acute issues identified  Official radiology report(s): Dg Chest 2 View  Result Date: 05/23/2018 CLINICAL DATA:  Fatigue.  Recent pacemaker placement. EXAM: CHEST - 2 VIEW COMPARISON:  Radiograph 05/15/2018 FINDINGS: Dual lead left-sided pacemaker in place with leads projecting over the right atrium and ventricle. The cardiomediastinal contours are normal. The lungs are clear. Pulmonary vasculature is normal. No consolidation, pleural effusion, or pneumothorax. No acute osseous abnormalities are seen. IMPRESSION: Left-sided pacemaker in place with intact leads. No acute pulmonary process. Electronically Signed   By: Narda Rutherford M.D.   On: 05/23/2018 22:15    ____________________________________________   PROCEDURES  Critical Care performed: No   Procedure(s)  performed:   Procedures   ____________________________________________   INITIAL IMPRESSION / ASSESSMENT AND PLAN / ED COURSE  As part of my medical decision making, I reviewed the following data within the electronic MEDICAL RECORD NUMBER Nursing notes reviewed and incorporated, Labs reviewed , EKG interpreted , Old chart reviewed, Discussed with cardiologist (Dr. Darrold Junker) and reviewed Notes from prior ED visits    Differential diagnosis includes, but is not limited to, volume depletion leading to hypotension, malfunctioning pacemaker, electrolyte abnormality, ACS, less likely infection.  The patient is well-appearing and in no acute distress.  He is having some symptoms consistent with hypertension such as a lightheadedness and even the general malaise and headache but his vital signs are otherwise reassuring.  His initial blood pressure was 83/59 which came up to 90/62.  When I checked the blood pressure when I was in the room was lying flat in mild Trendelenburg the patient's blood pressure was 142/90.  His lab work is all reassuring with an essentially normal conference of metabolic panel, normal CBC, and negative troponin.  Chest x-ray does not reveal any sign of infection or fluid.  He has no peripheral edema on physical exam.  I will give him another liter of fluid; I reviewed his medical record extensively including his recent echocardiogram which demonstrated normal systolic function and I believe that he would benefit from the extra volume.  We will then reassess after a total of 1.5 L of fluid to determine if he needs to be admitted for persistent symptomatic hypotension and cardiology evaluation or if he can be discharged for close follow-up in clinic.  He is agreeable to this plan.  Clinical Course as of May 24 502  Wed May 24, 2018  0115 I spoke by phone with Dr. Darrold Junker to discuss the case.  After a liter and a half of fluids, the patient has a resting blood pressure of about 150  systolic, which dropped to 130 with sitting and dropped down to 90 with standing, but patient was not symptomatic at that time.  He is feeling better after the fluids.  Dr. Darrold Junker did not feel that the patient would benefit from admission and can be further evaluated in the clinic.  I discussed this with the patient who is comfortable with the plan.  I gave my usual and customary return precautions.   [CF]    Clinical Course User Index [CF] Loleta Rose, MD    ____________________________________________  FINAL CLINICAL IMPRESSION(S) / ED DIAGNOSES  Final diagnoses:  Hypotension due to hypovolemia  Orthostatic dizziness     MEDICATIONS GIVEN DURING THIS VISIT:  Medications  0.9 %  sodium chloride infusion ( Intravenous Stopped 05/23/18 2207)  sodium chloride 0.9 % bolus 1,000 mL (0 mLs Intravenous  Stopped 05/24/18 0051)     ED Discharge Orders    None       Note:  This document was prepared using Dragon voice recognition software and may include unintentional dictation errors.    Loleta Rose, MD 05/24/18 [phone]

## 2018-05-23 NOTE — ED Triage Notes (Signed)
Pt arrived from home via EMS with complaints of dizziness. Pt states that around 4:00pm today he was having dizzy spells and developed a HA. Pt is alert and oriented x 4. Pt denies LOC. VS per EMS BP-116/62. 20 gauge was placed in right wrist via EMS. Pt has Allergy to Sulfa, does not take any medications, and has Hx of a pacemaker being placed about 8 days ago. Pt was given NaCl en route.

## 2018-05-24 NOTE — Discharge Instructions (Signed)
Your workup in the Emergency Department today was reassuring.  We did not find any specific abnormalities.  After a liter and a half of fluids you seem to be improving although you continue to have a drop of your blood pressure when you stand up.  We recommend you drink plenty of fluids, take your regular medications and/or any new ones prescribed today, and follow up with the doctor(s) listed in these documents as recommended.  Dr. Darrold Junker agreed that there was no need for you to stay in the hospital tonight, but you can follow up in clinic.  Return to the Emergency Department if you develop new or worsening symptoms that concern you.

## 2018-05-28 ENCOUNTER — Emergency Department: Payer: BLUE CROSS/BLUE SHIELD

## 2018-05-28 ENCOUNTER — Other Ambulatory Visit: Payer: Self-pay

## 2018-05-28 ENCOUNTER — Emergency Department
Admission: EM | Admit: 2018-05-28 | Discharge: 2018-05-28 | Disposition: A | Discharge status: Home / Self Care | Payer: BLUE CROSS/BLUE SHIELD | Attending: Emergency Medicine | Admitting: Emergency Medicine

## 2018-05-28 DIAGNOSIS — J189 Pneumonia, unspecified organism: Secondary | ICD-10-CM

## 2018-05-28 DIAGNOSIS — R0602 Shortness of breath: Secondary | ICD-10-CM | POA: Diagnosis not present

## 2018-05-28 DIAGNOSIS — Z87891 Personal history of nicotine dependence: Secondary | ICD-10-CM | POA: Insufficient documentation

## 2018-05-28 DIAGNOSIS — Z79899 Other long term (current) drug therapy: Secondary | ICD-10-CM | POA: Insufficient documentation

## 2018-05-28 DIAGNOSIS — R079 Chest pain, unspecified: Secondary | ICD-10-CM

## 2018-05-28 DIAGNOSIS — I10 Essential (primary) hypertension: Secondary | ICD-10-CM | POA: Diagnosis not present

## 2018-05-28 DIAGNOSIS — Z950 Presence of cardiac pacemaker: Secondary | ICD-10-CM | POA: Insufficient documentation

## 2018-05-28 LAB — BASIC METABOLIC PANEL
Anion gap: 9 (ref 5–15)
BUN: 12 mg/dL (ref 6–20)
CALCIUM: 8.6 mg/dL — AB (ref 8.9–10.3)
CO2: 24 mmol/L (ref 22–32)
CREATININE: 0.93 mg/dL (ref 0.61–1.24)
Chloride: 108 mmol/L (ref 98–111)
GFR calc non Af Amer: >60 mL/min (ref >60)
Glucose, Bld: 119 mg/dL — ABNORMAL HIGH (ref 70–99)
Potassium: 3.3 mmol/L — ABNORMAL LOW (ref 3.5–5.1)
Sodium: 141 mmol/L (ref 135–145)

## 2018-05-28 LAB — FIBRIN DERIVATIVES D-DIMER (ARMC ONLY): Fibrin derivatives D-dimer (ARMC): 642.39 ng/mL (FEU) — ABNORMAL HIGH (ref 0.00–499.00)

## 2018-05-28 LAB — CBC
HEMATOCRIT: 42.6 % (ref 39.0–52.0)
HEMOGLOBIN: 14.4 g/dL (ref 13.0–17.0)
MCH: 29 pg (ref 26.0–34.0)
MCHC: 33.8 g/dL (ref 30.0–36.0)
MCV: 85.9 fL (ref 80.0–100.0)
NRBC: 0 % (ref 0.0–0.2)
Platelets: 269 10*3/uL (ref 150–400)
RBC: 4.96 MIL/uL (ref 4.22–5.81)
RDW: 12.5 % (ref 11.5–15.5)
WBC: 6.6 10*3/uL (ref 4.0–10.5)

## 2018-05-28 LAB — TROPONIN I
Troponin I: <0.03 ng/mL (ref <0.03)
Troponin I: <0.03 ng/mL (ref <0.03)

## 2018-05-28 LAB — BRAIN NATRIURETIC PEPTIDE: B Natriuretic Peptide: 8 pg/mL (ref 0.0–100.0)

## 2018-05-28 MED ORDER — SODIUM CHLORIDE 0.9 % IV BOLUS
1000.0000 mL | Freq: Once | INTRAVENOUS | Status: AC
  Administered 2018-05-28: 1000 mL via INTRAVENOUS

## 2018-05-28 MED ORDER — SODIUM CHLORIDE 0.9 % IV SOLN
1.0000 g | Freq: Once | INTRAVENOUS | Status: AC
  Administered 2018-05-28: 1 g via INTRAVENOUS
  Filled 2018-05-28: qty 10

## 2018-05-28 MED ORDER — ASPIRIN 81 MG PO CHEW
324.0000 mg | CHEWABLE_TABLET | Freq: Once | ORAL | Status: AC
  Administered 2018-05-28: 324 mg via ORAL
  Filled 2018-05-28: qty 4

## 2018-05-28 MED ORDER — IOHEXOL 350 MG/ML SOLN
75.0000 mL | Freq: Once | INTRAVENOUS | Status: AC | PRN
  Administered 2018-05-28: 75 mL via INTRAVENOUS

## 2018-05-28 MED ORDER — DOXYCYCLINE HYCLATE 100 MG PO CAPS: 100.0000 mg | ORAL_CAPSULE | Freq: Two times a day (BID) | ORAL | 0 refills | Status: AC

## 2018-05-28 MED ORDER — DOXYCYCLINE HYCLATE 100 MG PO TABS
100.0000 mg | ORAL_TABLET | Freq: Once | ORAL | Status: AC
  Administered 2018-05-28: 100 mg via ORAL
  Filled 2018-05-28: qty 1

## 2018-05-28 MED ORDER — NITROGLYCERIN 0.4 MG SL SUBL
0.4000 mg | SUBLINGUAL_TABLET | SUBLINGUAL | Status: DC | PRN
  Administered 2018-05-28 (×2): 0.4 mg via SUBLINGUAL
  Filled 2018-05-28 (×2): qty 1

## 2018-05-28 MED ORDER — NITROGLYCERIN 0.4 MG SL SUBL: 0.4000 mg | SUBLINGUAL_TABLET | SUBLINGUAL | 0 refills | Status: DC | PRN

## 2018-05-28 MED ORDER — CEFPODOXIME PROXETIL 200 MG PO TABS: 200.0000 mg | ORAL_TABLET | Freq: Two times a day (BID) | ORAL | 0 refills | Status: AC

## 2018-05-28 NOTE — ED Provider Notes (Signed)
[facility] Emergency Department Provider Note  ____________________________________________  Time seen: Approximately 7:00 PM  I have reviewed the triage vital signs and the nursing notes.   HISTORY  Chief Complaint Chest Pain   HPI Corey [name] is a 32 y.o. male with a history of bipolar disorder, hypertension, sick sinus syndrome status post pacemaker implantation 2 weeks ago who presents for evaluation of chest pain.  Patient reports that he has been having daily episodes of chest pain for 2 to 3 months.  The one today made him more short of breath than the others which made him concerned.  He describes the pain as squeezing, located in the center of his chest, radiating to his shoulder blade and up to his neck.  The pain can last from several minutes to several hours.  The one today started while he was walking to the kitchen at home and has been persistent for several hours.  He has shortness of breath which is mild associated with it.  No diaphoresis, no nausea or vomiting, no lightheadedness.  Patient has family history of heart attacks on uncles and grandparents. He snuffs and is a former smoker.  Patient has family history of blood clot and uncle but no personal history, he had recently hospitalization for the pacemaker implantation.  During the hospitalization patient was not on blood thinners and had SCDs for DVT prevention.  He denies hemoptysis, cough, URI symptoms, fever or chills.   Past Medical History:  Diagnosis Date  . Bipolar 1 disorder (HCC)   . GERD (gastroesophageal reflux disease)   . HTN (hypertension)   . 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 Bipolar 1 disorder (HCC) 05/04/2018  . Syncope and collapse 04/29/2018  . Hypotension 04/29/2018  . Bradycardia 04/24/2018  . 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 Kidney stone 03/26/2018    Past  Surgical History:  Procedure Laterality Date  . ESOPHAGOGASTRODUODENOSCOPY (EGD) WITH PROPOFOL N/A 04/26/2018   Procedure: ESOPHAGOGASTRODUODENOSCOPY (EGD) WITH PROPOFOL;  Surgeon: Wyline Mood, MD;  Location: [facility] ENDOSCOPY;  Service: Gastroenterology;  Laterality: N/A;  . KNEE ARTHROSCOPY    . PACEMAKER INSERTION Left 05/15/2018   Procedure: INSERTION PACEMAKER;  Surgeon: Marcina Millard, MD;  Location: [facility] ORS;  Service: Cardiovascular;  Laterality: Left;    Prior to Admission medications   Medication Sig Start Date End Date Taking? Authorizing Provider  clonazePAM (KLONOPIN) 1 MG tablet Take 1 mg by mouth daily as needed for anxiety.    Yes [provider]  cloNIDine (CATAPRES) 0.1 MG tablet Take 0.1 mg by mouth 3 (three) times daily.   Yes [provider]  divalproex (DEPAKOTE ER) 500 MG 24 hr tablet Take 500 mg by mouth at bedtime.  04/24/18  Yes [provider]  FLUoxetine (PROZAC) 40 MG capsule Take 40 mg by mouth daily. 02/28/18  Yes [provider]  midodrine (PROAMATINE) 2.5 MG tablet Take 2.5 mg by mouth 2 (two) times daily. 05/02/18  Yes [provider]  pantoprazole (PROTONIX) 40 MG tablet Take 1 tablet (40 mg total) by mouth daily. 03/27/18 05/29/19 Yes Sainani, Rolly Pancake, MD  sucralfate (CARAFATE) 1 g tablet Take 1 tablet (1 g total) by mouth 4 (four) times daily. 04/01/18  Yes Sharman Cheek, MD  cefpodoxime (VANTIN) 200 MG tablet Take 1 tablet (200 mg total) by mouth 2 (two) times daily for 7 days. 05/28/18 06/04/18  Don Perking, [place], MD  doxycycline (VIBRAMYCIN) 100 MG capsule Take 1 capsule (100 mg total) by mouth 2 (two) times daily for 7 days. 05/28/18 06/04/18  Nita Sickle, MD  nitroGLYCERIN (NITROSTAT) 0.4 MG SL tablet Place 1 tablet (0.4 mg total) under the tongue every 5 (five) minutes as needed for chest pain. 05/28/18 05/28/19  Nita Sickle, MD    Allergies Penicillins and Sulfa antibiotics  Family History   Problem Relation Age of Onset  . Kidney cancer Neg Hx   . Bladder Cancer Neg Hx   . Prostate cancer Neg Hx     Social History Social History   Tobacco Use  . Smoking status: Former Games developer  . Smokeless tobacco: Current User    Types: Snuff  Substance Use Topics  . Alcohol use: No  . Drug use: Not Currently    Types: Marijuana    Review of Systems  Constitutional: Negative for fever. Eyes: Negative for visual changes. ENT: Negative for sore throat. Neck: No neck pain  Cardiovascular: + chest pain. Respiratory: + shortness of breath. Gastrointestinal: Negative for abdominal pain, vomiting or diarrhea. Genitourinary: Negative for dysuria. Musculoskeletal: Negative for back pain. Skin: Negative for rash. Neurological: Negative for headaches, weakness or numbness. Psych: No SI or HI  ____________________________________________   PHYSICAL EXAM:  VITAL SIGNS: ED Triage Vitals  Enc Vitals Group     BP 05/28/18 1427 114/76     Pulse Rate 05/28/18 1427 90     Resp 05/28/18 1427 16     Temp 05/28/18 1427 98.3 F (36.8 C)     Temp Source 05/28/18 1427 Oral     SpO2 05/28/18 1427 97 %     Weight 05/28/18 1428 254 lb (115.2 kg)     Height 05/28/18 1428 5\' 11"  (1.803 m)     Head Circumference --      Peak Flow --      Pain Score 05/28/18 1427 7     Pain Loc --      Pain Edu? --      Excl. in GC? --     Constitutional: Alert and oriented. Well appearing and in no apparent distress. HEENT:      Head: Normocephalic and atraumatic.         Eyes: Conjunctivae are normal. Sclera is non-icteric.       Mouth/Throat: Mucous membranes are moist.       Neck: Supple with no signs of meningismus. Cardiovascular: Regular rate and rhythm. No murmurs, gallops, or rubs. 2+ symmetrical distal pulses are present in all extremities. No JVD. Respiratory: Normal respiratory effort. Lungs are clear to auscultation bilaterally. No wheezes, crackles, or rhonchi.  Gastrointestinal: Soft,  non tender, and non distended with positive bowel sounds. No rebound or guarding. Musculoskeletal: Nontender with normal range of motion in all extremities. No edema, cyanosis, or erythema of extremities. Neurologic: Normal speech and language. Face is symmetric. Moving all extremities. No gross focal neurologic deficits are appreciated. Skin: Skin is warm, dry and intact. No rash noted. Psychiatric: Mood and affect are normal. Speech and behavior are normal.  ____________________________________________   LABS (all labs ordered are listed, but only abnormal results are displayed)  Labs Reviewed  BASIC METABOLIC PANEL - Abnormal; Notable for the following components:      Result Value   Potassium 3.3 (*)    Glucose, Bld 119 (*)    Calcium 8.6 (*)    All other components within normal limits  FIBRIN DERIVATIVES D-DIMER ([facility] ONLY) -  Abnormal; Notable for the following components:   Fibrin derivatives D-dimer [facility]) 642.39 (*)    All other components within normal limits  CBC  TROPONIN I  TROPONIN I  BRAIN NATRIURETIC PEPTIDE   ____________________________________________  EKG  ED ECG REPORT I, Nita Sickle, the attending physician, personally viewed and interpreted this ECG.  Normal sinus rhythm, rate of 89, normal intervals, normal axis, no ST elevations or depressions, T wave inversions in anterior leads.  No changes when compared to prior.  19:07 -normal sinus rhythm, rate of 79, normal intervals, normal axis, persistent T wave inversions with no ST elevations.  Unchanged from initial. ____________________________________________  RADIOLOGY  I have personally reviewed the images performed during this visit and I agree with the Radiologist's read.   Interpretation by Radiologist:  Dg Chest 2 View  Result Date: 05/28/2018 CLINICAL DATA:  Pacemaker placement on 05/17/2018 with additional device on external chest that cannot be removed until 05/28/2018. Central chest  pain today radiating to the left shoulder. Vomiting 2 days ago with dyspnea. EXAM: CHEST - 2 VIEW COMPARISON:  05/23/2018 FINDINGS: Pacemaker apparatus projecting over the left mid hemithorax with leads in the right atrium and right ventricle is redemonstrated with additional moderate device projecting over the left lung apex since prior. No pulmonary consolidation, effusion or pneumothorax. Heart size is top-normal. Nonaneurysmal thoracic aorta. No acute osseous abnormality. Slight vascular congestion since prior. IMPRESSION: Slight vascular congestion since prior. Additional monitoring device projects over the left lung apex without complicating features. Electronically Signed   By: Tollie Eth M.D.   On: 05/28/2018 14:55   Ct Angio Chest Pe W And/or Wo Contrast  Result Date: 05/28/2018 CLINICAL DATA:  Pacemaker placed 05/17/2018. Orthostatic hypotension and bradycardia. Central chest pain today radiating to left shoulder. Vomiting 2 days ago. Shortness of breath. EXAM: CT ANGIOGRAPHY CHEST WITH CONTRAST TECHNIQUE: Multidetector CT imaging of the chest was performed using the standard protocol during bolus administration of intravenous contrast. Multiplanar CT image reconstructions and MIPs were obtained to evaluate the vascular anatomy. CONTRAST:  75mL OMNIPAQUE IOHEXOL 350 MG/ML SOLN. First attempt at intravenous contrast injection extravasated as total of 150 mL contrast was used. COMPARISON:  Chest x-ray 05/28/2018 FINDINGS: Cardiovascular: Pacemaker over the left anterior chest wall. Heart is normal size. Thoracic aorta is within normal. No evidence of pulmonary embolism. Mediastinum/Nodes: No mediastinal or hilar adenopathy. Remaining mediastinal structures are within normal. Lungs/Pleura: Lungs are adequately inflated. There is mild patchy hazy opacification over the right lower lobe which may be due to atelectasis or early infection. No evidence of effusion. Airways are normal. Upper Abdomen: No acute  findings. Musculoskeletal: Unremarkable. Review of the MIP images confirms the above findings. IMPRESSION: No evidence of pulmonary embolism. Mild patchy hazy airspace process over the right lower lobe likely early infection and less likely atelectasis. Electronically Signed   By: Elberta Fortis M.D.   On: 05/28/2018 20:43     ____________________________________________   PROCEDURES  Procedure(s) performed: None Procedures Critical Care performed:  None ____________________________________________   INITIAL IMPRESSION / ASSESSMENT AND PLAN / ED COURSE   32 y.o. male with a history of bipolar disorder, hypertension, sick sinus syndrome status post pacemaker implantation 2 weeks ago who presents for evaluation of chest pain.  Patient reports having daily episodes of chest pain for 2 to 3 months.  He did have a recent admission for pacemaker placement therefore we will check a d-dimer to rule out PE.  EKG shows no acute ischemic changes. Troponin  x 2 negative.  CXR with no PTX or PNA.  Interrogation of pacemaker showing no abnormalities.  Patient was given a full dose of aspirin and nitroglycerin x 2 with full resolution of his pain. Repeat EKG unchanged.    Contrast from CT extravasated. Limb elevated, ice applied. No evidence of compartment syndrome. IVF given since patient received 2 loads of contrast for PE study.  Clinical Course as of May 28 2204  Wynelle Link May 28, 2018  2053 Second troponin negative.  Patient remains without pain after 2 nitros.  D-dimer was elevated and patient was sent for CT of the chest which is negative for PE but does show early developing pneumonia.  No signs of sepsis.  Patient will be given Rocephin and doxycycline.  Spoke with Dr. Gwen Pounds who recommended outpatient f/u with dr. Darrold Junker and no indication for admission at this time. I will give patient a prescription for nitroglycerine. Although patient was recently admitted to the hospital he was not intubated during  the placement of the defibrillator per cardiologist therefore I am less concerned about HCAP. I think patient warrants a trial of outpatient abx.  Discussed very strict return precautions with patient.   [CV]    Clinical Course User Index [CV] Don Perking [place], MD     As part of my medical decision making, I reviewed the following data within the electronic MEDICAL RECORD NUMBER Nursing notes reviewed and incorporated, Labs reviewed , EKG interpreted , Old EKG reviewed, Old chart reviewed, Radiograph reviewed , A consult was requested and obtained from this/these consultant(s) Cardiology, Notes from prior ED visits and [place] Controlled Substance Database    Pertinent labs & imaging results that were available during my care of the patient were reviewed by me and considered in my medical decision making (see chart for details).    ____________________________________________   FINAL CLINICAL IMPRESSION(S) / ED DIAGNOSES  Final diagnoses:  Chest pain, unspecified type  Community acquired pneumonia, unspecified laterality      NEW MEDICATIONS STARTED DURING THIS VISIT:  ED Discharge Orders         Ordered    nitroGLYCERIN (NITROSTAT) 0.4 MG SL tablet  Every 5 min PRN     05/28/18 2203    cefpodoxime (VANTIN) 200 MG tablet  2 times daily     05/28/18 2203    doxycycline (VIBRAMYCIN) 100 MG capsule  2 times daily     05/28/18 2203           Note:  This document was prepared using Dragon voice recognition software and may include unintentional dictation errors.    Nita Sickle, MD 05/28/18 2206

## 2018-05-28 NOTE — Discharge Instructions (Addendum)
You were seen for chest pain. Your work up showed early pneumonia. Take antibiotics as prescribed. If you have recurrence of your chest pain you may take up to 3 sublingual nitros every 5 min. If the pain does not resolve please call 911. Follow up with Dr. Darrold Junker tomorrow for further evaluation. Return to the ER for new or worsening chest pain, shortness of breath or fever.  When should you call for help?  Call 911 if: You passed out (lost consciousness) or if you feel dizzy. You have difficulty breathing. You have symptoms of a heart attack. These may include: Chest pain or pressure, or a strange feeling in your chest. Indigestion. Sweating. Shortness of breath. Nausea or vomiting. Pain, pressure, or a strange feeling in your back, neck, jaw, or upper belly or in one or both shoulders or arms. Lightheadedness or sudden weakness. A fast or irregular heartbeat. After you call 911, the operator may tell you to chew 1 adult-strength or 2 to 4 low-dose aspirin. Wait for an ambulance. Do not try to drive yourself.   Call your doctor today if: You have any trouble breathing. Your chest pain gets worse. You are dizzy or lightheaded, or you feel like you may faint. You are not getting better as expected. You are having new or different chest pain  How can you care for yourself at home? Rest until you feel better. Take your medicine exactly as prescribed. Call your doctor if you think you are having a problem with your medicine. Do not drive after taking a prescription pain medicine.

## 2018-05-28 NOTE — ED Triage Notes (Signed)
Pt arrives [facility].  States recent pacemaker placement on 10/16. Placed at [facility]. States hx of orthostatic hypotension and bradycardia. 80/60 with EMS. 130/70 once in the truck.   Central CP today, radiates to L shoulder, vomiting Friday. Also c/o SOB.   A&O, in wheelchair. No distress noted at this time. Speaking in clear, complete sentences. No resp distress noted.

## 2018-05-28 NOTE — ED Notes (Signed)
Placed ice bag on pt arm at infiltration site.

## 2018-05-28 NOTE — ED Notes (Signed)
BP 137/97

## 2018-05-30 ENCOUNTER — Ambulatory Visit: Payer: BLUE CROSS/BLUE SHIELD

## 2018-06-06 ENCOUNTER — Ambulatory Visit: Payer: Self-pay | Admitting: Urology

## 2018-06-08 ENCOUNTER — Encounter: Payer: Self-pay | Admitting: Emergency Medicine

## 2018-06-08 ENCOUNTER — Emergency Department
Admission: EM | Admit: 2018-06-08 | Discharge: 2018-06-08 | Disposition: A | Discharge status: Home / Self Care | Payer: BLUE CROSS/BLUE SHIELD | Attending: Emergency Medicine | Admitting: Emergency Medicine

## 2018-06-08 ENCOUNTER — Emergency Department: Payer: BLUE CROSS/BLUE SHIELD

## 2018-06-08 ENCOUNTER — Other Ambulatory Visit: Payer: Self-pay

## 2018-06-08 DIAGNOSIS — Z79899 Other long term (current) drug therapy: Secondary | ICD-10-CM | POA: Insufficient documentation

## 2018-06-08 DIAGNOSIS — Z87891 Personal history of nicotine dependence: Secondary | ICD-10-CM | POA: Insufficient documentation

## 2018-06-08 DIAGNOSIS — F319 Bipolar disorder, unspecified: Secondary | ICD-10-CM | POA: Diagnosis not present

## 2018-06-08 DIAGNOSIS — R55 Syncope and collapse: Secondary | ICD-10-CM | POA: Diagnosis not present

## 2018-06-08 DIAGNOSIS — I10 Essential (primary) hypertension: Secondary | ICD-10-CM | POA: Insufficient documentation

## 2018-06-08 DIAGNOSIS — Z950 Presence of cardiac pacemaker: Secondary | ICD-10-CM | POA: Insufficient documentation

## 2018-06-08 LAB — URINE DRUG SCREEN, QUALITATIVE (ARMC ONLY)
Amphetamines, Ur Screen: NOT DETECTED
BARBITURATES, UR SCREEN: NOT DETECTED
BENZODIAZEPINE, UR SCRN: POSITIVE — AB
CANNABINOID 50 NG, UR ~~LOC~~: POSITIVE — AB
Cocaine Metabolite,Ur ~~LOC~~: NOT DETECTED
MDMA (Ecstasy)Ur Screen: NOT DETECTED
METHADONE SCREEN, URINE: NOT DETECTED
OPIATE, UR SCREEN: NOT DETECTED
Phencyclidine (PCP) Ur S: NOT DETECTED
TRICYCLIC, UR SCREEN: NOT DETECTED

## 2018-06-08 LAB — BASIC METABOLIC PANEL
Anion gap: 8 (ref 5–15)
BUN: 13 mg/dL (ref 6–20)
CALCIUM: 8.1 mg/dL — AB (ref 8.9–10.3)
CHLORIDE: 109 mmol/L (ref 98–111)
CO2: 23 mmol/L (ref 22–32)
CREATININE: 1.12 mg/dL (ref 0.61–1.24)
GFR calc Af Amer: >60 mL/min (ref >60)
GFR calc non Af Amer: >60 mL/min (ref >60)
GLUCOSE: 99 mg/dL (ref 70–99)
Potassium: 3.4 mmol/L — ABNORMAL LOW (ref 3.5–5.1)
Sodium: 140 mmol/L (ref 135–145)

## 2018-06-08 LAB — CBC WITH DIFFERENTIAL/PLATELET
Abs Immature Granulocytes: 0.03 10*3/uL (ref 0.00–0.07)
BASOS PCT: 1 %
Basophils Absolute: 0 10*3/uL (ref 0.0–0.1)
EOS ABS: 0.5 10*3/uL (ref 0.0–0.5)
EOS PCT: 8 %
HEMATOCRIT: 45.6 % (ref 39.0–52.0)
Hemoglobin: 15.6 g/dL (ref 13.0–17.0)
IMMATURE GRANULOCYTES: 1 %
Lymphocytes Relative: 28 %
Lymphs Abs: 1.8 10*3/uL (ref 0.7–4.0)
MCH: 30 pg (ref 26.0–34.0)
MCHC: 34.2 g/dL (ref 30.0–36.0)
MCV: 87.7 fL (ref 80.0–100.0)
MONO ABS: 0.4 10*3/uL (ref 0.1–1.0)
MONOS PCT: 7 %
NEUTROS PCT: 55 %
Neutro Abs: 3.8 10*3/uL (ref 1.7–7.7)
PLATELETS: 241 10*3/uL (ref 150–400)
RBC: 5.2 MIL/uL (ref 4.22–5.81)
RDW: 13.1 % (ref 11.5–15.5)
WBC: 6.6 10*3/uL (ref 4.0–10.5)
nRBC: 0 % (ref 0.0–0.2)

## 2018-06-08 LAB — ETHANOL: Alcohol, Ethyl (B): <10 mg/dL (ref <10)

## 2018-06-08 LAB — TROPONIN I: Troponin I: <0.03 ng/mL (ref <0.03)

## 2018-06-08 MED ORDER — SODIUM CHLORIDE 0.9 % IV BOLUS
500.0000 mL | Freq: Once | INTRAVENOUS | Status: AC
  Administered 2018-06-08: 1000 mL via INTRAVENOUS

## 2018-06-08 NOTE — ED Notes (Signed)
Red top drawn and sent

## 2018-06-08 NOTE — Discharge Instructions (Signed)
return to the emergency room for any new or worrisome symptoms, follow up with her cardiologist, as well as the doctors listed above. Drink plenty of fluids. Do not drive, climb ladders or doing anything else which, if interrupted by passing out, could cause you harm. weight very much feel he should drink a lot of fluids and make sure you  stand up very very slowly

## 2018-06-08 NOTE — ED Notes (Signed)
Orthostatics completed. Patient c/o of dizziness when standing. Dr. Alphonzo Lemmings aware.

## 2018-06-08 NOTE — ED Provider Notes (Signed)
[facility] Emergency Department Provider Note  ____________________________________________   I have reviewed the triage vital signs and the nursing notes. Where available I have reviewed prior notes and, if possible and indicated, outside hospital notes.    HISTORY  Chief Complaint Loss of Consciousness    HPI Corey [name] is a 32 y.o. 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 Get to the bathroom and passed out today he states. This is not unusual states it happens several times a week. He did not suffer any significant injury he feels from this. Usually when this happens his blood pressure is low and he gets IV fluid and feels better. He states he has had no fever no chills no nausea or vomiting chest pain. He states this is exactly the same as his symptoms always been. He has been seen here 7 times or so in the emergency room for this. He did see his cardiologist for this a few days ago. His pacemaker is reportedly working well. His cardiologist she states is not sure what else to do.     Past Medical History:  Diagnosis Date  . Bipolar 1 disorder (HCC)   . GERD (gastroesophageal reflux disease)   . HTN (hypertension)   . 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 Bipolar 1 disorder (HCC) 05/04/2018  . Syncope and collapse 04/29/2018  . Hypotension 04/29/2018  . Bradycardia 04/24/2018  . 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 Kidney stone 03/26/2018    Past Surgical History:  Procedure Laterality Date  . ESOPHAGOGASTRODUODENOSCOPY (EGD) WITH PROPOFOL N/A 04/26/2018   Procedure: ESOPHAGOGASTRODUODENOSCOPY (EGD) WITH PROPOFOL;  Surgeon: Wyline Mood, MD;  Location: [facility] ENDOSCOPY;  Service: Gastroenterology;  Laterality: N/A;  . KNEE ARTHROSCOPY    .  PACEMAKER INSERTION Left 05/15/2018   Procedure: INSERTION PACEMAKER;  Surgeon: Marcina Millard, MD;  Location: [facility] ORS;  Service: Cardiovascular;  Laterality: Left;    Prior to Admission medications   Medication Sig Start Date End Date Taking? Authorizing Provider  clonazePAM (KLONOPIN) 1 MG tablet Take 1 mg by mouth daily as needed for anxiety.     [provider]  cloNIDine (CATAPRES) 0.1 MG tablet Take 0.1 mg by mouth 3 (three) times daily.    [provider]  divalproex (DEPAKOTE ER) 500 MG 24 hr tablet Take 500 mg by mouth at bedtime.  04/24/18   [provider]  FLUoxetine (PROZAC) 40 MG capsule Take 40 mg by mouth daily. 02/28/18   [provider]  midodrine (PROAMATINE) 2.5 MG tablet Take 2.5 mg by mouth 2 (two) times daily. 05/02/18   [provider]  nitroGLYCERIN (NITROSTAT) 0.4 MG SL tablet Place 1 tablet (0.4 mg total) under the tongue every 5 (five) minutes as needed for chest pain. 05/28/18 05/28/19  Nita Sickle, MD  pantoprazole (PROTONIX) 40 MG tablet Take 1 tablet (40 mg total) by mouth daily. 03/27/18 05/29/19  Houston Siren, MD  sucralfate (CARAFATE) 1 g tablet Take 1 tablet (1 g total) by mouth 4 (four) times daily. 04/01/18   Sharman Cheek, MD    Allergies Penicillins and Sulfa antibiotics  Family History  Problem Relation Age of Onset  . Kidney cancer Neg Hx   . Bladder Cancer Neg Hx   . Prostate cancer Neg Hx     Social  History Social History   Tobacco Use  . Smoking status: Former Games developer  . Smokeless tobacco: Current User    Types: Snuff  Substance Use Topics  . Alcohol use: No  . Drug use: Not Currently    Types: Marijuana    Review of Systems Constitutional: No fever/chills Eyes: No visual changes. ENT: No sore throat. No stiff neck no neck pain Cardiovascular: Denies chest pain. Respiratory: Denies shortness of breath. Gastrointestinal:   no vomiting.  No diarrhea.  No  constipation. Genitourinary: Negative for dysuria. Musculoskeletal: Negative lower extremity swelling Skin: Negative for rash. Neurological: Negative for severe headaches, focal weakness or numbness.   ____________________________________________   PHYSICAL EXAM:  VITAL SIGNS: ED Triage Vitals  Enc Vitals Group     BP 06/08/18 0850 (!) 91/53     Pulse Rate 06/08/18 0850 66     Resp 06/08/18 0850 12     Temp 06/08/18 0850 98.7 F (37.1 C)     Temp Source 06/08/18 0850 Oral     SpO2 06/08/18 0850 97 %     Weight 06/08/18 0854 254 lb (115.2 kg)     Height 06/08/18 0854 5\' 11"  (1.803 m)     Head Circumference --      Peak Flow --      Pain Score 06/08/18 0853 5     Pain Loc --      Pain Edu? --      Excl. in GC? --     Constitutional: Alert and oriented. Well appearing and in no acute distress. Eyes: Conjunctivae are normal Head: Atraumatic HEENT: No congestion/rhinnorhea. Mucous membranes are moist.  Oropharynx non-erythematous Neck:   Nontender with no meningismus, no masses, no stridor Cardiovascular: Normal rate, regular rhythm. Grossly normal heart sounds.  Good peripheral circulation. Respiratory: Normal respiratory effort.  No retractions. Lungs CTAB. Abdominal: Soft and nontender. No distention. No guarding no rebound Back:  There is no focal tenderness or step off.  there is no midline tenderness there are no lesions noted. there is no CVA tenderness Musculoskeletal: No lower extremity tenderness, no upper extremity tenderness. No joint effusions, no DVT signs strong distal pulses no edema Neurologic:  Normal speech and language. No gross focal neurologic deficits are appreciated.  Skin:  Skin is warm, dry and intact. No rash noted. Psychiatric: Mood and affect are normal. Speech and behavior are normal.  ____________________________________________   LABS (all labs ordered are listed, but only abnormal results are displayed)  Labs Reviewed  BASIC METABOLIC  PANEL - Abnormal; Notable for the following components:      Result Value   Potassium 3.4 (*)    Calcium 8.1 (*)    All other components within normal limits  CBC WITH DIFFERENTIAL/PLATELET  TROPONIN I  URINE DRUG SCREEN, QUALITATIVE ([facility] ONLY)  ETHANOL    Pertinent labs  results that were available during my care of the patient were reviewed by me and considered in my medical decision making (see chart for details). ____________________________________________  EKG  I personally interpreted any EKGs ordered by me or triage paced rhythm, rate 63 bpm, atrially paced. ____________________________________________  RADIOLOGY  Pertinent labs & imaging results that were available during my care of the patient were reviewed by me and considered in my medical decision making (see chart for details). If possible, patient and/or family made aware of any abnormal findings.  Dg Chest 2 View  Result Date: 06/08/2018 CLINICAL DATA:  Pt ems from home for syncopal episode. Pt states  he has been feeling increased weakness and passing out frequently within the last few weeks. Pt with hx of same with pacemaker placed October 14. Pt also c/o neck pain, history of hypertension, bradycardia. Former smoker. EXAM: CHEST - 2 VIEW COMPARISON:  05/28/2018 FINDINGS: Patient has a LEFT-sided transvenous pacemaker with leads to the RIGHT atrium and RIGHT ventricle. Heart size is normal. Lungs are clear. IMPRESSION: No active cardiopulmonary disease. Electronically Signed   By: Norva Pavlov M.D.   On: 06/08/2018 09:45   ____________________________________________    PROCEDURES  Procedure(s) performed: None  Procedures  Critical Care performed: None  ____________________________________________   INITIAL IMPRESSION / ASSESSMENT AND PLAN / ED COURSE  Pertinent labs & imaging results that were available during my care of the patient were reviewed by me and considered in my medical decision making  (see chart for details).  difficult to know examined with today for this patient in the long-term, he has a chronic problem with syncope, he is followed closely by cardiologist and has had a pacemaker placed. I did interrogate the pacemaker, and it is working well with no evidence of tachydysrhythmias of any significance or bradycardia dysrhythmias. He is absolutely asymptomatic here, using his cell phone. Initial pressure somewhat low as it has been in the past. We have given him IV fluid, pressures rapidly responded. His been admitted to the hospital for this several times for not to be admitted. I did discuss with his personal cardiologist, Dr. Darrold Junker, does not feel the patient needs to be admitted but does recommend that they continue outpatient therapy via cardiology and also that they follow up with an endocrinologist as well as a neurologist. I talked to Dr. Thad Ranger, our on-call neurologist, she feels that there is no acute intervention required in the emergency room and recommends outpatient follow-up with neurology as well as endocrinology. We will see if we can arrange this. I'll make sure the patient is not orthostatic, this is a chronic recurrent problem for which I don't think emergent hospitalization likely will benefit the patient, and is a strong preference of his to go home. I advised him not to drive, climb ladders or soak in tub, etc, and we will have him return to the emergency room for any other new or worrisome symptoms    ____________________________________________   FINAL CLINICAL IMPRESSION(S) / ED DIAGNOSES  Final diagnoses:  None      This chart was dictated using voice recognition software.  Despite best efforts to proofread,  errors can occur which can change meaning.      Jeanmarie Plant, MD 06/08/18 1154

## 2018-06-08 NOTE — ED Triage Notes (Signed)
Pt ems from home for syncopal episode. Pt with hx of same with pacemake placed oct 14. Pt also c/o neck pain. Ems gave 400cc ns, cbl 141.

## 2018-06-12 ENCOUNTER — Emergency Department: Payer: BLUE CROSS/BLUE SHIELD

## 2018-06-12 ENCOUNTER — Other Ambulatory Visit: Payer: Self-pay

## 2018-06-12 DIAGNOSIS — Z87891 Personal history of nicotine dependence: Secondary | ICD-10-CM | POA: Diagnosis not present

## 2018-06-12 DIAGNOSIS — Z950 Presence of cardiac pacemaker: Secondary | ICD-10-CM | POA: Diagnosis not present

## 2018-06-12 DIAGNOSIS — Z79899 Other long term (current) drug therapy: Secondary | ICD-10-CM | POA: Diagnosis not present

## 2018-06-12 DIAGNOSIS — I10 Essential (primary) hypertension: Secondary | ICD-10-CM | POA: Diagnosis not present

## 2018-06-12 DIAGNOSIS — R079 Chest pain, unspecified: Secondary | ICD-10-CM | POA: Insufficient documentation

## 2018-06-12 DIAGNOSIS — R001 Bradycardia, unspecified: Secondary | ICD-10-CM | POA: Diagnosis not present

## 2018-06-12 LAB — BASIC METABOLIC PANEL
Anion gap: 8 (ref 5–15)
BUN: 13 mg/dL (ref 6–20)
CALCIUM: 8.7 mg/dL — AB (ref 8.9–10.3)
CO2: 23 mmol/L (ref 22–32)
Chloride: 105 mmol/L (ref 98–111)
Creatinine, Ser: 0.91 mg/dL (ref 0.61–1.24)
GFR calc non Af Amer: >60 mL/min (ref >60)
GLUCOSE: 109 mg/dL — AB (ref 70–99)
Potassium: 3.8 mmol/L (ref 3.5–5.1)
Sodium: 136 mmol/L (ref 135–145)

## 2018-06-12 LAB — CBC
HCT: 48 % (ref 39.0–52.0)
Hemoglobin: 16.3 g/dL (ref 13.0–17.0)
MCH: 29.4 pg (ref 26.0–34.0)
MCHC: 34 g/dL (ref 30.0–36.0)
MCV: 86.6 fL (ref 80.0–100.0)
NRBC: 0 % (ref 0.0–0.2)
PLATELETS: 279 10*3/uL (ref 150–400)
RBC: 5.54 MIL/uL (ref 4.22–5.81)
RDW: 12.9 % (ref 11.5–15.5)
WBC: 8.1 10*3/uL (ref 4.0–10.5)

## 2018-06-12 LAB — TROPONIN I: Troponin I: <0.03 ng/mL (ref <0.03)

## 2018-06-12 NOTE — ED Triage Notes (Signed)
Patient reporting mid sternal chest pain.  Pain free at this time.

## 2018-06-12 NOTE — ED Notes (Signed)
Patient to triage by EMS for complaint of chest pain. Per EMS 180/110, mid sternal chest pain with left arm numbness (patient with pacemaker) HR 82.  EMS reports patient with increased stress.  Aspirin 325mg  given by EMS.

## 2018-06-13 ENCOUNTER — Emergency Department
Admission: EM | Admit: 2018-06-13 | Discharge: 2018-06-13 | Disposition: A | Discharge status: Home / Self Care | Payer: BLUE CROSS/BLUE SHIELD | Attending: Emergency Medicine | Admitting: Emergency Medicine

## 2018-06-13 DIAGNOSIS — R079 Chest pain, unspecified: Secondary | ICD-10-CM

## 2018-06-13 LAB — LIPASE, BLOOD: Lipase: 32 U/L (ref 11–51)

## 2018-06-13 LAB — TROPONIN I: Troponin I: <0.03 ng/mL

## 2018-06-13 LAB — HEPATIC FUNCTION PANEL
ALT: 22 U/L (ref 0–44)
AST: 20 U/L (ref 15–41)
Albumin: 3.8 g/dL (ref 3.5–5.0)
Alkaline Phosphatase: 68 U/L (ref 38–126)
TOTAL PROTEIN: 7 g/dL (ref 6.5–8.1)
Total Bilirubin: 0.6 mg/dL (ref 0.3–1.2)

## 2018-06-13 NOTE — ED Provider Notes (Signed)
[facility] Emergency Department Provider Note  ____________________________________________   First MD Initiated Contact with Patient 06/13/18 0109     (approximate)  I have reviewed the triage vital signs and the nursing notes.   HISTORY  Chief Complaint Chest Pain    HPI Corey [name] is a 32 y.o. male who is well-known to this emergency department for visits related to his symptomatic bradycardia versus tachybradycardia syndrome status post pacemaker implantation.  He presents tonight with by EMS for evaluation of chest pain.  He states that at around 9:00 PM while he was watching TV he had acute onset of substernal chest pain that was sharp and radiated to his left arm.  It lasted for minutes and is completely resolved but he was concerned given his cardiac history.  He has a stress test scheduled for later today and a follow-up appointment with another doctor in a couple of days.  He denies shortness of breath, fever/chills, cough, nausea, vomiting, abdominal pain, and numbness and weakness in his extremities.  He is currently asymptomatic.  Nothing in particular made his symptoms better nor worse.  Normally he is hypotensive and was bradycardic prior to his pacemaker implantation but currently his blood pressure is a little bit elevated.  He denies lightheadedness and dizziness.  Past Medical History:  Diagnosis Date  . Bipolar 1 disorder (HCC)   . GERD (gastroesophageal reflux disease)   . HTN (hypertension)   . 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 Bipolar 1 disorder (HCC) 05/04/2018  . Syncope and collapse 04/29/2018  . Hypotension 04/29/2018  . Bradycardia 04/24/2018  . 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 Kidney stone 03/26/2018    Past Surgical History:  Procedure Laterality Date  . ESOPHAGOGASTRODUODENOSCOPY (EGD) WITH PROPOFOL N/A 04/26/2018   Procedure: ESOPHAGOGASTRODUODENOSCOPY (EGD) WITH PROPOFOL;  Surgeon: Wyline Mood, MD;  Location: [facility] ENDOSCOPY;  Service: Gastroenterology;  Laterality: N/A;  . KNEE ARTHROSCOPY    . PACEMAKER INSERTION Left 05/15/2018   Procedure: INSERTION PACEMAKER;  Surgeon: Marcina Millard, MD;  Location: [facility] ORS;  Service: Cardiovascular;  Laterality: Left;    Prior to Admission medications   Medication Sig Start Date End Date Taking? Authorizing Provider  clonazePAM (KLONOPIN) 1 MG tablet Take 1 mg by mouth daily as needed for anxiety.     [provider]  cloNIDine (CATAPRES) 0.1 MG tablet Take 0.1 mg by mouth 3 (three) times daily.    [provider]  divalproex (DEPAKOTE ER) 500 MG 24 hr tablet Take 500 mg by mouth at bedtime.  04/24/18   [provider]  FLUoxetine (PROZAC) 40 MG capsule Take 40 mg by mouth daily. 02/28/18   [provider]  midodrine (PROAMATINE) 2.5 MG tablet Take 2.5 mg by mouth 2 (two) times daily. 05/02/18   [provider]  nitroGLYCERIN (NITROSTAT) 0.4 MG SL tablet Place 1 tablet (0.4 mg total) under the tongue every 5 (five) minutes as needed for chest pain. 05/28/18 05/28/19  Nita Sickle, MD  pantoprazole (PROTONIX) 40 MG tablet Take 1 tablet (40 mg total) by mouth daily. 03/27/18 05/29/19  Houston Siren, MD  sucralfate (CARAFATE) 1 g tablet Take 1 tablet (1 g total) by mouth 4 (four) times daily. 04/01/18   Sharman Cheek, MD    Allergies Penicillins and Sulfa antibiotics  Family History  Problem Relation Age of Onset  . Kidney cancer Neg Hx   .  Bladder Cancer Neg Hx   . Prostate cancer Neg Hx     Social History Social History   Tobacco Use  . Smoking status: Former Games developer  . Smokeless tobacco: Current User    Types: Snuff  Substance Use Topics  . Alcohol use: No  . Drug use: Not Currently    Types: Marijuana    Review of Systems Constitutional: No fever/chills Eyes: No visual changes. ENT: No  sore throat. Cardiovascular: Brief episode of chest pain as described above. Respiratory: Denies shortness of breath. Gastrointestinal: No abdominal pain.  No nausea, no vomiting.  No diarrhea.  No constipation. Genitourinary: Negative for dysuria. Musculoskeletal: Negative for neck pain.  Negative for back pain. Integumentary: Negative for rash. Neurological: Negative for headaches, focal weakness or numbness.   ____________________________________________   PHYSICAL EXAM:  VITAL SIGNS: ED Triage Vitals  Enc Vitals Group     BP 06/12/18 2237 (!) 150/95     Pulse Rate 06/12/18 2237 73     Resp 06/12/18 2237 18     Temp 06/12/18 2237 98.3 F (36.8 C)     Temp Source 06/12/18 2237 Oral     SpO2 06/12/18 2237 98 %     Weight --      Height --      Head Circumference --      Peak Flow --      Pain Score 06/12/18 2238 0     Pain Loc --      Pain Edu? --      Excl. in GC? --     Constitutional: Alert and oriented. Well appearing and in no acute distress. Eyes: Conjunctivae are normal.  Head: Atraumatic. Nose: No congestion/rhinnorhea. Mouth/Throat: Mucous membranes are moist. Neck: No stridor.  No meningeal signs.   Cardiovascular: Normal rate, regular rhythm. Good peripheral circulation. Grossly normal heart sounds.  No reproducible sternal tenderness.  Pacemaker is in place on the left upper chest.  Respiratory: Normal respiratory effort.  No retractions. Lungs CTAB. Gastrointestinal: Soft and nontender. No distention.  Musculoskeletal: No lower extremity tenderness nor edema. No gross deformities of extremities. Neurologic:  Normal speech and language. No gross focal neurologic deficits are appreciated.  Skin:  Skin is warm, dry and intact. No rash noted. Psychiatric: Mood and affect are normal. Speech and behavior are normal.  ____________________________________________   LABS (all labs ordered are listed, but only abnormal results are displayed)  Labs Reviewed    BASIC METABOLIC PANEL - Abnormal; Notable for the following components:      Result Value   Glucose, Bld 109 (*)    Calcium 8.7 (*)    All other components within normal limits  CBC  TROPONIN I  HEPATIC FUNCTION PANEL  LIPASE, BLOOD  TROPONIN I   ____________________________________________  EKG  ED ECG REPORT I, Loleta Rose, the attending physician, personally viewed and interpreted this ECG.  Date: 06/12/2018 EKG Time: 22:42 Rate: 66 Rhythm: normal sinus rhythm QRS Axis: normal Intervals: normal ST/T Wave abnormalities: T-wave inversions in V2, V3, V4, V5, similar to prior Narrative Interpretation: Nonspecific ST changes but no evidence of acute ischemia and no significant change from prior  ____________________________________________  RADIOLOGY I, Loleta Rose, personally viewed and evaluated these images (plain radiographs) as part of my medical decision making, as well as reviewing the written report by the radiologist.  ED MD interpretation: No indication of active disease.  Official radiology report(s): Dg Chest 2 View  Result Date: 06/12/2018 CLINICAL DATA:  Chest pain  EXAM: CHEST - 2 VIEW COMPARISON:  06/08/2018, 05/28/2018 FINDINGS: Left-sided pacing device as before. No focal opacity or pleural effusion. Normal heart size. No pneumothorax. IMPRESSION: No active cardiopulmonary disease. Electronically Signed   By: Jasmine PangKim  Fujinaga M.D.   On: 06/12/2018 22:58    ____________________________________________   PROCEDURES  Critical Care performed: No   Procedure(s) performed:   Procedures   ____________________________________________   INITIAL IMPRESSION / ASSESSMENT AND PLAN / ED COURSE  As part of my medical decision making, I reviewed the following data within the electronic MEDICAL RECORD NUMBER Nursing notes reviewed and incorporated, Labs reviewed , EKG interpreted , Old chart reviewed, Radiograph reviewed  and Notes from prior ED visits     Differential diagnosis includes, but is not limited to, nonspecific atypical chest pain, musculoskeletal pain, ACS, pneumonia, PE.  The patient is well-appearing and in no acute distress and had only brief transient chest pain.  He has had similar chest pain in the past.  His vital signs are stable Including a good blood pressure which is fortunate and somewhat unusual for him.  He has normal labs including basic metabolic panel, hepatic function panel, lipase, CBC, and a negative troponin.  His chest x-ray is unremarkable and his EKG shows no signs of ischemia.  He is well-known to both the emergency department and cardiology and has a follow-up appointment later today for a stress test.  I discussed the plan with the patient and we agreed that if he has a repeat negative second 3 to 4-hour troponin he is comfortable with discharge and outpatient follow-up and I think that is appropriate.   Clinical Course as of Jun 14 307  Tue Jun 13, 2018  [id number] The second troponin is negative.  Patient has had no more chest pain.  I will discharge for outpatient follow-up.   [CF]    Clinical Course User Index [CF] Loleta RoseForbach, Lindsay Soulliere, MD    ____________________________________________  FINAL CLINICAL IMPRESSION(S) / ED DIAGNOSES  Final diagnoses:  Chest pain, unspecified type     MEDICATIONS GIVEN DURING THIS VISIT:  Medications - No data to display   ED Discharge Orders    None       Note:  This document was prepared using Dragon voice recognition software and may include unintentional dictation errors.    Loleta RoseForbach, Loralyn Rachel, MD 06/13/18 [phone]

## 2018-06-13 NOTE — Discharge Instructions (Signed)

## 2018-06-27 ENCOUNTER — Emergency Department
Admission: EM | Admit: 2018-06-27 | Discharge: 2018-06-28 | Disposition: A | Discharge status: Home / Self Care | Payer: BLUE CROSS/BLUE SHIELD | Source: Home / Self Care | Attending: Emergency Medicine | Admitting: Emergency Medicine

## 2018-06-27 ENCOUNTER — Other Ambulatory Visit: Payer: Self-pay

## 2018-06-27 ENCOUNTER — Encounter: Payer: Self-pay | Admitting: Emergency Medicine

## 2018-06-27 DIAGNOSIS — F13951 Sedative, hypnotic or anxiolytic use, unspecified with sedative, hypnotic or anxiolytic-induced psychotic disorder with hallucinations: Secondary | ICD-10-CM

## 2018-06-27 DIAGNOSIS — F3162 Bipolar disorder, current episode mixed, moderate: Secondary | ICD-10-CM

## 2018-06-27 DIAGNOSIS — Z87891 Personal history of nicotine dependence: Secondary | ICD-10-CM

## 2018-06-27 DIAGNOSIS — Z79899 Other long term (current) drug therapy: Secondary | ICD-10-CM | POA: Insufficient documentation

## 2018-06-27 DIAGNOSIS — F315 Bipolar disorder, current episode depressed, severe, with psychotic features: Secondary | ICD-10-CM | POA: Diagnosis not present

## 2018-06-27 DIAGNOSIS — I10 Essential (primary) hypertension: Secondary | ICD-10-CM | POA: Insufficient documentation

## 2018-06-27 DIAGNOSIS — Z950 Presence of cardiac pacemaker: Secondary | ICD-10-CM

## 2018-06-27 DIAGNOSIS — F3163 Bipolar disorder, current episode mixed, severe, without psychotic features: Secondary | ICD-10-CM

## 2018-06-27 DIAGNOSIS — F29 Unspecified psychosis not due to a substance or known physiological condition: Secondary | ICD-10-CM

## 2018-06-27 LAB — BASIC METABOLIC PANEL
Anion gap: 8 (ref 5–15)
BUN: 13 mg/dL (ref 6–20)
CHLORIDE: 105 mmol/L (ref 98–111)
CO2: 26 mmol/L (ref 22–32)
CREATININE: 1.07 mg/dL (ref 0.61–1.24)
Calcium: 7.8 mg/dL — ABNORMAL LOW (ref 8.9–10.3)
GFR calc non Af Amer: >60 mL/min (ref >60)
Glucose, Bld: 116 mg/dL — ABNORMAL HIGH (ref 70–99)
POTASSIUM: 3.1 mmol/L — AB (ref 3.5–5.1)
SODIUM: 139 mmol/L (ref 135–145)

## 2018-06-27 LAB — URINALYSIS, COMPLETE (UACMP) WITH MICROSCOPIC
BACTERIA UA: NONE SEEN
Bilirubin Urine: NEGATIVE
Glucose, UA: NEGATIVE mg/dL
Hgb urine dipstick: NEGATIVE
Ketones, ur: 5 mg/dL — AB
Leukocytes, UA: NEGATIVE
Nitrite: NEGATIVE
PROTEIN: NEGATIVE mg/dL
Specific Gravity, Urine: 1.021 (ref 1.005–1.030)
pH: 5 (ref 5.0–8.0)

## 2018-06-27 LAB — URINE DRUG SCREEN, QUALITATIVE (ARMC ONLY)
AMPHETAMINES, UR SCREEN: NOT DETECTED
BARBITURATES, UR SCREEN: NOT DETECTED
BENZODIAZEPINE, UR SCRN: POSITIVE — AB
Cannabinoid 50 Ng, Ur ~~LOC~~: NOT DETECTED
Cocaine Metabolite,Ur ~~LOC~~: NOT DETECTED
MDMA (Ecstasy)Ur Screen: NOT DETECTED
METHADONE SCREEN, URINE: NOT DETECTED
Opiate, Ur Screen: NOT DETECTED
Phencyclidine (PCP) Ur S: NOT DETECTED
Tricyclic, Ur Screen: NOT DETECTED

## 2018-06-27 LAB — CBC
HEMATOCRIT: 45.9 % (ref 39.0–52.0)
Hemoglobin: 15.3 g/dL (ref 13.0–17.0)
MCH: 29.3 pg (ref 26.0–34.0)
MCHC: 33.3 g/dL (ref 30.0–36.0)
MCV: 87.9 fL (ref 80.0–100.0)
Platelets: 257 10*3/uL (ref 150–400)
RBC: 5.22 MIL/uL (ref 4.22–5.81)
RDW: 12.7 % (ref 11.5–15.5)
WBC: 8 10*3/uL (ref 4.0–10.5)
nRBC: 0 % (ref 0.0–0.2)

## 2018-06-27 LAB — TROPONIN I

## 2018-06-27 MED ORDER — SODIUM CHLORIDE 0.9 % IV SOLN: 1.0000 g | Freq: Once | INTRAVENOUS | Status: DC

## 2018-06-27 MED ORDER — POTASSIUM CHLORIDE CRYS ER 20 MEQ PO TBCR
40.0000 meq | EXTENDED_RELEASE_TABLET | Freq: Once | ORAL | Status: AC
  Administered 2018-06-27: 40 meq via ORAL
  Filled 2018-06-27: qty 2

## 2018-06-27 MED ORDER — CALCIUM GLUCONATE-NACL 1-0.675 GM/50ML-% IV SOLN
1.0000 g | Freq: Once | INTRAVENOUS | Status: AC
  Administered 2018-06-27: 1000 mg via INTRAVENOUS
  Filled 2018-06-27: qty 50

## 2018-06-27 MED ORDER — SODIUM CHLORIDE 0.9 % IV BOLUS
1000.0000 mL | Freq: Once | INTRAVENOUS | Status: AC
  Administered 2018-06-27: 1000 mL via INTRAVENOUS

## 2018-06-27 NOTE — ED Notes (Signed)
SOC now in room 25.  Pt alert, calm and cooperative.

## 2018-06-27 NOTE — ED Notes (Signed)
Pt reports he is hallucinating   md aware.  Pt states he is seeing people in the room   Pt calm and cooperative.

## 2018-06-27 NOTE — ED Notes (Signed)
Pt reports syncopal episode today.  Pt struck the coffee table.  Pt denies neck or back pain.  Pt denies h/a.  No chest pain or sob.  Pt alert on arrival  Iv in place and labs sent.

## 2018-06-27 NOTE — ED Notes (Signed)
Pacemaker interrogated.  md aware.  nsr on monitor.  Pt alert.  Pt denies SI or HI.  md aware.

## 2018-06-27 NOTE — ED Notes (Signed)
Pt resting quietly.  No chest pain or sob.  Pt alert.  Iv fluids infusing.

## 2018-06-27 NOTE — ED Triage Notes (Signed)
Pt to ED via EMS from home c/o syncope when walking back to his chair, fall hit head on coffee table, pacemaker placed 05/15/18, recent medication changes increased depakote and prozac.  States generalized weakness, dizzy upon standing.  EMS vitals 140/105 BP, 95 HR, 95%, 134 CBG, States n/v/d for a couple days.

## 2018-06-27 NOTE — ED Provider Notes (Signed)
[facility] Emergency Department Provider Note   ____________________________________________   I have reviewed the triage vital signs and the nursing notes.   HISTORY  Chief Complaint Loss of Consciousness   History limited by: Not Limited   HPI Corey [name] is a 32 y.o. male who presents to the emergency department today after a syncopal episode. The patient states he was walking through his house when it occurred.  Patient states that he felt on the ground.  He thinks that he might of hit his head against a piece of furniture.  He woke up on the ground.  He states that this is happened to him with some frequency recently.  He denies any chest pain when this occurred.  States that he is felt weak and intoxicated. States that he feels like part of the problem is that they have been adjusting his bipolar medications in an attempt to help these symptoms.    Per medical record review patient has a history of GERD, HTN, recent pacemaker insertion  Past Medical History:  Diagnosis Date  . Bipolar 1 disorder (HCC)   . GERD (gastroesophageal reflux disease)   . HTN (hypertension)   . 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 Bipolar 1 disorder (HCC) 05/04/2018  . Syncope and collapse 04/29/2018  . Hypotension 04/29/2018  . Bradycardia 04/24/2018  . 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 Kidney stone 03/26/2018    Past Surgical History:  Procedure Laterality Date  . ESOPHAGOGASTRODUODENOSCOPY (EGD) WITH PROPOFOL N/A 04/26/2018   Procedure: ESOPHAGOGASTRODUODENOSCOPY (EGD) WITH PROPOFOL;  Surgeon: Wyline Mood, MD;  Location: [facility] ENDOSCOPY;  Service: Gastroenterology;  Laterality: N/A;  . KNEE ARTHROSCOPY    . PACEMAKER INSERTION Left 05/15/2018   Procedure: INSERTION PACEMAKER;  Surgeon: Marcina Millard, MD;  Location: [facility] ORS;  Service: Cardiovascular;   Laterality: Left;    Prior to Admission medications   Medication Sig Start Date End Date Taking? Authorizing Provider  clonazePAM (KLONOPIN) 1 MG tablet Take 1 mg by mouth daily as needed for anxiety.     [provider]  cloNIDine (CATAPRES) 0.1 MG tablet Take 0.1 mg by mouth 3 (three) times daily.    [provider]  divalproex (DEPAKOTE ER) 500 MG 24 hr tablet Take 500 mg by mouth at bedtime.  04/24/18   [provider]  FLUoxetine (PROZAC) 40 MG capsule Take 40 mg by mouth daily. 02/28/18   [provider]  midodrine (PROAMATINE) 2.5 MG tablet Take 2.5 mg by mouth 2 (two) times daily. 05/02/18   [provider]  nitroGLYCERIN (NITROSTAT) 0.4 MG SL tablet Place 1 tablet (0.4 mg total) under the tongue every 5 (five) minutes as needed for chest pain. 05/28/18 05/28/19  Nita Sickle, MD  pantoprazole (PROTONIX) 40 MG tablet Take 1 tablet (40 mg total) by mouth daily. 03/27/18 05/29/19  Houston Siren, MD  sucralfate (CARAFATE) 1 g tablet Take 1 tablet (1 g total) by mouth 4 (four) times daily. 04/01/18   Sharman Cheek, MD    Allergies Penicillins and Sulfa antibiotics  Family History  Problem Relation Age of Onset  . Kidney cancer Neg Hx   . Bladder Cancer Neg Hx   . Prostate cancer Neg Hx     Social History Social History   Tobacco Use  . Smoking status: Former Games developer  . Smokeless tobacco: Current User    Types: Snuff  Substance Use Topics  . Alcohol use: No  . Drug use: Not Currently    Types: Marijuana    Review of Systems Constitutional: No fever/chills Eyes: No visual changes. ENT: No sore throat. Cardiovascular: Denies chest pain. Respiratory: Denies shortness of breath. Gastrointestinal: No abdominal pain.  No nausea, no vomiting.  No diarrhea.   Genitourinary: Negative for dysuria. Musculoskeletal: Negative for back pain. Skin: Negative for rash. Neurological: Positive for syncopal episode.   ____________________________________________   PHYSICAL EXAM:  VITAL SIGNS: ED Triage Vitals  Enc Vitals Group     BP 06/27/18 1706 117/76     Pulse Rate 06/27/18 1706 91     Resp 06/27/18 1706 18     Temp 06/27/18 1706 98.5 F (36.9 C)     Temp Source 06/27/18 1706 Oral     SpO2 06/27/18 1700 95 %     Weight 06/27/18 1707 255 lb (115.7 kg)     Height 06/27/18 1707 5\' 11"  (1.803 m)     Head Circumference --      Peak Flow --      Pain Score 06/27/18 1707 5   Constitutional: Alert and oriented.  Eyes: Conjunctivae are normal.  ENT      Head: Normocephalic. Abrasion to forehead.      Nose: No congestion/rhinnorhea.      Mouth/Throat: Mucous membranes are moist.      Neck: No stridor. Hematological/Lymphatic/Immunilogical: No cervical lymphadenopathy. Cardiovascular: Normal rate, regular rhythm.  No murmurs, rubs, or gallops.  Respiratory: Normal respiratory effort without tachypnea nor retractions. Breath sounds are clear and equal bilaterally. No wheezes/rales/rhonchi. Gastrointestinal: Soft and non tender. No rebound. No guarding.  Genitourinary: Deferred Musculoskeletal: Normal range of motion in all extremities. No lower extremity edema. Neurologic:  Normal speech and language. No gross focal neurologic deficits are appreciated.  Skin:  Skin is warm, dry and intact. No rash noted. Psychiatric: Mood and affect are normal. Speech and behavior are normal. Patient exhibits appropriate insight and judgment.  ____________________________________________    LABS (pertinent positives/negatives)  UA clear, ketones 5, 6-10 wbcs UDS positive for benzo Trop <0.03 CBC wbc 8.0, hgb 15.3, plt 257 BMP wnl except k 3.1, glu 116, ca 7.8 measure ____________________________________________   EKG  I, Phineas SemenGraydon Sumaiya Arruda, attending physician, personally viewed and interpreted this EKG  EKG Time: 1703 Rate: 88 Rhythm: sinus rhythm Axis: left axis deviation Intervals: qtc  511 QRS: narrow ST changes: no st elevation Impression: abnormal ekg  ____________________________________________    RADIOLOGY  None  ____________________________________________   PROCEDURES  Procedures  ____________________________________________   INITIAL IMPRESSION / ASSESSMENT AND PLAN / ED COURSE  Pertinent labs & imaging results that were available during my care of the patient were reviewed by me and considered in my medical decision making (see chart for details).   Patient presented to the emergency department after a syncopal episode. The patient has the history of same and has had pacemaker placed for bradycardia. Pacemaker was interrogated here without any concerning findings. While here the patient was also complaining of hallucinations. Does have a history of bipolar. Will have SOC evaluate the patient.    ____________________________________________   FINAL CLINICAL IMPRESSION(S) / ED DIAGNOSES  Syncope Hallucinations  Note: This dictation was prepared with Dragon dictation. Any transcriptional errors that result from this process are unintentional     Phineas SemenGoodman, Kelena Garrow, MD 06/28/18 [phone]

## 2018-06-28 ENCOUNTER — Emergency Department
Admission: EM | Admit: 2018-06-28 | Discharge: 2018-06-29 | Disposition: A | Discharge status: Other Acute Inpatient Hospital | Payer: BLUE CROSS/BLUE SHIELD | Source: Home / Self Care | Attending: Emergency Medicine | Admitting: Emergency Medicine

## 2018-06-28 ENCOUNTER — Other Ambulatory Visit: Payer: Self-pay

## 2018-06-28 DIAGNOSIS — Z79899 Other long term (current) drug therapy: Secondary | ICD-10-CM

## 2018-06-28 DIAGNOSIS — F319 Bipolar disorder, unspecified: Secondary | ICD-10-CM | POA: Insufficient documentation

## 2018-06-28 DIAGNOSIS — Z87891 Personal history of nicotine dependence: Secondary | ICD-10-CM | POA: Insufficient documentation

## 2018-06-28 DIAGNOSIS — R441 Visual hallucinations: Secondary | ICD-10-CM | POA: Insufficient documentation

## 2018-06-28 DIAGNOSIS — F29 Unspecified psychosis not due to a substance or known physiological condition: Secondary | ICD-10-CM

## 2018-06-28 DIAGNOSIS — Z950 Presence of cardiac pacemaker: Secondary | ICD-10-CM

## 2018-06-28 DIAGNOSIS — Z008 Encounter for other general examination: Secondary | ICD-10-CM | POA: Insufficient documentation

## 2018-06-28 LAB — COMPREHENSIVE METABOLIC PANEL
ALBUMIN: 3.6 g/dL (ref 3.5–5.0)
ALT: 18 U/L (ref 0–44)
AST: 16 U/L (ref 15–41)
Alkaline Phosphatase: 72 U/L (ref 38–126)
Anion gap: 9 (ref 5–15)
BUN: 10 mg/dL (ref 6–20)
CHLORIDE: 107 mmol/L (ref 98–111)
CO2: 26 mmol/L (ref 22–32)
Calcium: 8.5 mg/dL — ABNORMAL LOW (ref 8.9–10.3)
Creatinine, Ser: 0.89 mg/dL (ref 0.61–1.24)
GFR calc Af Amer: >60 mL/min (ref >60)
Glucose, Bld: 132 mg/dL — ABNORMAL HIGH (ref 70–99)
POTASSIUM: 3.8 mmol/L (ref 3.5–5.1)
SODIUM: 142 mmol/L (ref 135–145)
Total Bilirubin: 0.5 mg/dL (ref 0.3–1.2)
Total Protein: 6.6 g/dL (ref 6.5–8.1)

## 2018-06-28 LAB — URINE DRUG SCREEN, QUALITATIVE (ARMC ONLY)
Amphetamines, Ur Screen: NOT DETECTED
BARBITURATES, UR SCREEN: NOT DETECTED
Benzodiazepine, Ur Scrn: POSITIVE — AB
COCAINE METABOLITE, UR ~~LOC~~: NOT DETECTED
Cannabinoid 50 Ng, Ur ~~LOC~~: NOT DETECTED
MDMA (ECSTASY) UR SCREEN: NOT DETECTED
METHADONE SCREEN, URINE: NOT DETECTED
Opiate, Ur Screen: NOT DETECTED
Phencyclidine (PCP) Ur S: NOT DETECTED
Tricyclic, Ur Screen: NOT DETECTED

## 2018-06-28 LAB — URINALYSIS, COMPLETE (UACMP) WITH MICROSCOPIC
BILIRUBIN URINE: NEGATIVE
Bacteria, UA: NONE SEEN
GLUCOSE, UA: NEGATIVE mg/dL
HGB URINE DIPSTICK: NEGATIVE
Ketones, ur: 5 mg/dL — AB
Leukocytes, UA: NEGATIVE
NITRITE: NEGATIVE
PH: 6 (ref 5.0–8.0)
Protein, ur: NEGATIVE mg/dL
Specific Gravity, Urine: 1.024 (ref 1.005–1.030)

## 2018-06-28 LAB — CBC
HEMATOCRIT: 45.5 % (ref 39.0–52.0)
HEMOGLOBIN: 15.3 g/dL (ref 13.0–17.0)
MCH: 29.1 pg (ref 26.0–34.0)
MCHC: 33.6 g/dL (ref 30.0–36.0)
MCV: 86.5 fL (ref 80.0–100.0)
NRBC: 0 % (ref 0.0–0.2)
Platelets: 277 10*3/uL (ref 150–400)
RBC: 5.26 MIL/uL (ref 4.22–5.81)
RDW: 12.8 % (ref 11.5–15.5)
WBC: 8 10*3/uL (ref 4.0–10.5)

## 2018-06-28 LAB — ETHANOL: Alcohol, Ethyl (B): <10 mg/dL (ref <10)

## 2018-06-28 LAB — VALPROIC ACID LEVEL: Valproic Acid Lvl: 57 ug/mL (ref 50.0–100.0)

## 2018-06-28 MED ORDER — NICOTINE 21 MG/24HR TD PT24
21.0000 mg | MEDICATED_PATCH | Freq: Once | TRANSDERMAL | Status: DC
  Administered 2018-06-28: 21 mg via TRANSDERMAL
  Filled 2018-06-28: qty 1

## 2018-06-28 NOTE — ED Provider Notes (Signed)
-----------------------------------------   12:41 AM on 06/28/2018 -----------------------------------------  [facility] Orpah Clintonollin who recommends discontinue Latuda, continue Prozac, Depakote and Xanax.  Valproic acid level was checked.  Patient will be discharged home with outpatient follow-up.  Strict return precautions given.  Patient verbalizes understanding and agrees with plan of care.   Irean HongSung, Jade J, MD 06/28/18 [phone]

## 2018-06-28 NOTE — ED Notes (Signed)
[phone]

## 2018-06-28 NOTE — Discharge Instructions (Addendum)
1.  Discontinue Latuda for now until reassessed by your therapist. 2.  Continue Prozac, Depakote and Xanax as currently prescribed. 3.  Return to the ER for worsening symptoms, feelings of hurting yourself or others, or other concerns.

## 2018-06-28 NOTE — ED Triage Notes (Signed)
Pt here to see psychiatrist was here yesterday for the same but states "I did not say everything I needed to say". Pt denies any SI but does have HI.

## 2018-06-28 NOTE — ED Notes (Signed)
Pt texted Efraim KaufmannMelissa (wife) password information

## 2018-06-28 NOTE — ED Provider Notes (Signed)
[facility] Emergency Department Provider Note  Time seen: 8:32 PM  I have reviewed the triage vital signs and the nursing notes.   HISTORY  Chief Complaint Medical Clearance    HPI Corey [name] is a 32 y.o. male with a past medical history of bipolar, gastric reflux, hypertension, presents to the emergency department for psychiatric evaluation.  According to the patient and record review patient was seen yesterday and discharged early this morning.  Patient states he was not completely honest with a psychiatrist, states for the past several weeks to months he has been having visual as well as auditory hallucinations which have been getting worse.  Reports seeing his dead father.  Denies any SI or HI to myself.   Past Medical History:  Diagnosis Date  . Bipolar 1 disorder (HCC)   . GERD (gastroesophageal reflux disease)   . HTN (hypertension)   . 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 Darrold JunkerParaschos    Patient Active Problem List   Diagnosis Date Noted  . Bipolar 1 disorder (HCC) 05/04/2018  . Syncope and collapse 04/29/2018  . Hypotension 04/29/2018  . Bradycardia 04/24/2018  . 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 Kidney stone 03/26/2018    Past Surgical History:  Procedure Laterality Date  . ESOPHAGOGASTRODUODENOSCOPY (EGD) WITH PROPOFOL N/A 04/26/2018   Procedure: ESOPHAGOGASTRODUODENOSCOPY (EGD) WITH PROPOFOL;  Surgeon: Wyline MoodAnna, Kiran, MD;  [facility];  Service: Gastroenterology;  Laterality: N/A;  . KNEE ARTHROSCOPY    . PACEMAKER INSERTION Left 05/15/2018   Procedure: INSERTION PACEMAKER;  Surgeon: Marcina MillardParaschos, Alexander, MD;  Location: [facility] ORS;  Service: Cardiovascular;  Laterality: Left;    Prior to Admission medications   Medication Sig Start Date End Date Taking? Authorizing Provider  clonazePAM (KLONOPIN) 1 MG tablet Take 1 mg by mouth daily as needed for anxiety.     [provider]  cloNIDine  (CATAPRES) 0.1 MG tablet Take 0.1 mg by mouth 3 (three) times daily.    [provider]  divalproex (DEPAKOTE ER) 500 MG 24 hr tablet Take 500 mg by mouth at bedtime.  04/24/18   [provider]  FLUoxetine (PROZAC) 40 MG capsule Take 40 mg by mouth daily. 02/28/18   [provider]  midodrine (PROAMATINE) 2.5 MG tablet Take 2.5 mg by mouth 2 (two) times daily. 05/02/18   [provider]  nitroGLYCERIN (NITROSTAT) 0.4 MG SL tablet Place 1 tablet (0.4 mg total) under the tongue every 5 (five) minutes as needed for chest pain. 05/28/18 05/28/19  Nita SickleVeronese, [place], MD  pantoprazole (PROTONIX) 40 MG tablet Take 1 tablet (40 mg total) by mouth daily. 03/27/18 05/29/19  Houston SirenSainani, Vivek J, MD  sucralfate (CARAFATE) 1 g tablet Take 1 tablet (1 g total) by mouth 4 (four) times daily. 04/01/18   Sharman CheekStafford, Phillip, MD    Allergies  Allergen Reactions  . Penicillins Hives    Has patient had a PCN reaction causing immediate rash, facial/tongue/throat swelling, SOB or lightheadedness with hypotension: Yes Has patient had a PCN reaction causing severe rash involving mucus membranes or skin necrosis: No Has patient had a PCN reaction that required hospitalization: No Has patient had a PCN reaction occurring within the last 10 years: No If all of the above answers are "NO", then may proceed with Cephalosporin use.   . Sulfa Antibiotics Hives    Family History  Problem Relation Age of Onset  . Kidney cancer Neg Hx   . Bladder Cancer Neg Hx   .  Prostate cancer Neg Hx     Social History Social History   Tobacco Use  . Smoking status: Former Games developer  . Smokeless tobacco: Current User    Types: Snuff  Substance Use Topics  . Alcohol use: No  . Drug use: Not Currently    Types: Marijuana    Review of Systems Constitutional: Negative for fever Cardiovascular: Negative for chest pain. Respiratory: Negative for shortness of breath. Gastrointestinal: Negative for  abdominal pain.  One episode of diarrhea 2 days ago. Musculoskeletal: Negative for musculoskeletal complaints Neurological: Negative for headache All other ROS negative  ____________________________________________   PHYSICAL EXAM:  VITAL SIGNS: ED Triage Vitals [06/28/18 1914]  Enc Vitals Group     BP 131/84     Pulse Rate 78     Resp 18     Temp 97.7 F (36.5 C)     Temp Source Oral     SpO2 98 %     Weight 255 lb (115.7 kg)     Height      Head Circumference      Peak Flow      Pain Score 0     Pain Loc      Pain Edu?      Excl. in GC?    Constitutional: Alert and oriented. Well appearing and in no distress. Eyes: Normal exam ENT   Head: Normocephalic and atraumatic.   Mouth/Throat: Mucous membranes are moist. Cardiovascular: Normal rate, regular rhythm. No murmur Respiratory: Normal respiratory effort without tachypnea nor retractions. Breath sounds are clear  Gastrointestinal: Soft and nontender. No distention. Musculoskeletal: Nontender with normal range of motion in all extremities. Neurologic:  Normal speech and language. No gross focal neurologic deficits Skin:  Skin is warm, dry and intact.  Psychiatric: Mood and affect are normal.  ____________________________________________   INITIAL IMPRESSION / ASSESSMENT AND PLAN / ED COURSE  Pertinent labs & imaging results that were available during my care of the patient were reviewed by me and considered in my medical decision making (see chart for details).  Patient presents to the emergency department for reported auditory and visual hallucinations over the past several weeks.  No SI or HI at this time.  Patient is here voluntarily.  We will have SOC see the patient once again as the patient states he was not very forthcoming with the [facility] last night.  Patient's labs are largely within normal limits.  Psychiatric evaluation pending.  ____________________________________________   FINAL CLINICAL  IMPRESSION(S) / ED DIAGNOSES  Psychosis     Minna Antis, MD 06/28/18 [id number]

## 2018-06-28 NOTE — ED Notes (Signed)
Patient alert and oriented x4. Patient denies SI. Patient states having HI toward sister's boyfriend. He states he would not act on it. Patient states hearing whispers and has been seeing his dead dad that past away in 2012. Patient contracts for safety with this Clinical research associatewriter. Will continue to monitor.

## 2018-06-28 NOTE — ED Notes (Signed)
Report given to SOC 

## 2018-06-29 ENCOUNTER — Encounter: Payer: Self-pay | Admitting: Psychiatry

## 2018-06-29 ENCOUNTER — Inpatient Hospital Stay
Admission: AD | Admit: 2018-06-29 | Discharge: 2018-07-04 | DRG: 885 | Disposition: A | Discharge status: Home / Self Care | Payer: BLUE CROSS/BLUE SHIELD | Source: Intra-hospital | Attending: Psychiatry | Admitting: Psychiatry

## 2018-06-29 DIAGNOSIS — G4700 Insomnia, unspecified: Secondary | ICD-10-CM | POA: Diagnosis present

## 2018-06-29 DIAGNOSIS — F4310 Post-traumatic stress disorder, unspecified: Secondary | ICD-10-CM | POA: Diagnosis present

## 2018-06-29 DIAGNOSIS — F250 Schizoaffective disorder, bipolar type: Secondary | ICD-10-CM | POA: Diagnosis not present

## 2018-06-29 DIAGNOSIS — I10 Essential (primary) hypertension: Secondary | ICD-10-CM | POA: Diagnosis present

## 2018-06-29 DIAGNOSIS — F319 Bipolar disorder, unspecified: Secondary | ICD-10-CM | POA: Diagnosis not present

## 2018-06-29 DIAGNOSIS — Z87891 Personal history of nicotine dependence: Secondary | ICD-10-CM | POA: Diagnosis not present

## 2018-06-29 DIAGNOSIS — Z79899 Other long term (current) drug therapy: Secondary | ICD-10-CM | POA: Diagnosis not present

## 2018-06-29 DIAGNOSIS — R45850 Homicidal ideations: Secondary | ICD-10-CM | POA: Diagnosis present

## 2018-06-29 DIAGNOSIS — R001 Bradycardia, unspecified: Secondary | ICD-10-CM | POA: Diagnosis present

## 2018-06-29 DIAGNOSIS — K219 Gastro-esophageal reflux disease without esophagitis: Secondary | ICD-10-CM | POA: Diagnosis present

## 2018-06-29 DIAGNOSIS — Z950 Presence of cardiac pacemaker: Secondary | ICD-10-CM | POA: Diagnosis not present

## 2018-06-29 DIAGNOSIS — F315 Bipolar disorder, current episode depressed, severe, with psychotic features: Principal | ICD-10-CM | POA: Diagnosis present

## 2018-06-29 DIAGNOSIS — F29 Unspecified psychosis not due to a substance or known physiological condition: Secondary | ICD-10-CM | POA: Diagnosis present

## 2018-06-29 DIAGNOSIS — F419 Anxiety disorder, unspecified: Secondary | ICD-10-CM | POA: Diagnosis present

## 2018-06-29 DIAGNOSIS — F1011 Alcohol abuse, in remission: Secondary | ICD-10-CM | POA: Diagnosis present

## 2018-06-29 MED ORDER — PANTOPRAZOLE SODIUM 40 MG PO TBEC
40.0000 mg | DELAYED_RELEASE_TABLET | Freq: Every day | ORAL | Status: DC
  Administered 2018-06-30 – 2018-07-04 (×5): 40 mg via ORAL
  Filled 2018-06-29 (×5): qty 1

## 2018-06-29 MED ORDER — MIDODRINE HCL 5 MG PO TABS
2.5000 mg | ORAL_TABLET | Freq: Two times a day (BID) | ORAL | Status: DC
  Administered 2018-06-30 – 2018-07-03 (×6): 2.5 mg via ORAL
  Filled 2018-06-29 (×8): qty 0.5

## 2018-06-29 MED ORDER — HYDROXYZINE HCL 25 MG PO TABS
25.0000 mg | ORAL_TABLET | Freq: Three times a day (TID) | ORAL | Status: DC | PRN
  Administered 2018-06-30 – 2018-07-03 (×3): 25 mg via ORAL
  Filled 2018-06-29 (×3): qty 1

## 2018-06-29 MED ORDER — MAGNESIUM HYDROXIDE 400 MG/5ML PO SUSP: 30.0000 mL | Freq: Every day | ORAL | Status: DC | PRN

## 2018-06-29 MED ORDER — CLONAZEPAM 0.5 MG PO TABS: 0.5000 mg | ORAL_TABLET | Freq: Two times a day (BID) | ORAL | Status: DC | PRN

## 2018-06-29 MED ORDER — FLUOXETINE HCL 20 MG PO CAPS
40.0000 mg | ORAL_CAPSULE | Freq: Every day | ORAL | Status: DC
  Administered 2018-06-30 – 2018-07-04 (×5): 40 mg via ORAL
  Filled 2018-06-29 (×5): qty 2

## 2018-06-29 MED ORDER — CLONAZEPAM 1 MG PO TABS
1.0000 mg | ORAL_TABLET | Freq: Every day | ORAL | Status: DC | PRN
  Administered 2018-06-29: 1 mg via ORAL
  Filled 2018-06-29: qty 1

## 2018-06-29 MED ORDER — FLUOXETINE HCL 20 MG PO CAPS
40.0000 mg | ORAL_CAPSULE | Freq: Every day | ORAL | Status: DC
  Administered 2018-06-29: 40 mg via ORAL
  Filled 2018-06-29: qty 2

## 2018-06-29 MED ORDER — PANTOPRAZOLE SODIUM 40 MG PO TBEC
40.0000 mg | DELAYED_RELEASE_TABLET | Freq: Every day | ORAL | Status: DC
  Administered 2018-06-29: 40 mg via ORAL
  Filled 2018-06-29: qty 1

## 2018-06-29 MED ORDER — CLONIDINE HCL 0.1 MG PO TABS
0.1000 mg | ORAL_TABLET | Freq: Three times a day (TID) | ORAL | Status: DC
  Administered 2018-06-29: 0.1 mg via ORAL
  Filled 2018-06-29: qty 1

## 2018-06-29 MED ORDER — RISPERIDONE 1 MG PO TABS
1.0000 mg | ORAL_TABLET | Freq: Every day | ORAL | Status: DC
  Administered 2018-06-29: 1 mg via ORAL
  Filled 2018-06-29: qty 1

## 2018-06-29 MED ORDER — ACETAMINOPHEN 325 MG PO TABS: 650.0000 mg | ORAL_TABLET | Freq: Four times a day (QID) | ORAL | Status: DC | PRN

## 2018-06-29 MED ORDER — CLONIDINE HCL 0.1 MG PO TABS
0.1000 mg | ORAL_TABLET | Freq: Three times a day (TID) | ORAL | Status: DC
  Administered 2018-06-29 – 2018-07-03 (×8): 0.1 mg via ORAL
  Filled 2018-06-29 (×10): qty 1

## 2018-06-29 MED ORDER — DIVALPROEX SODIUM ER 500 MG PO TB24: 500.0000 mg | ORAL_TABLET | Freq: Every day | ORAL | Status: DC

## 2018-06-29 MED ORDER — ALUM & MAG HYDROXIDE-SIMETH 200-200-20 MG/5ML PO SUSP: 30.0000 mL | ORAL | Status: DC | PRN

## 2018-06-29 MED ORDER — DIVALPROEX SODIUM ER 500 MG PO TB24
1000.0000 mg | ORAL_TABLET | Freq: Every day | ORAL | Status: DC
  Administered 2018-06-29 – 2018-07-03 (×5): 1000 mg via ORAL
  Filled 2018-06-29 (×5): qty 2

## 2018-06-29 MED ORDER — MIDODRINE HCL 5 MG PO TABS
2.5000 mg | ORAL_TABLET | Freq: Two times a day (BID) | ORAL | Status: DC
  Administered 2018-06-29: 2.5 mg via ORAL
  Filled 2018-06-29 (×3): qty 0.5

## 2018-06-29 MED ORDER — DIVALPROEX SODIUM ER 500 MG PO TB24
500.0000 mg | ORAL_TABLET | Freq: Every day | ORAL | Status: DC
  Administered 2018-06-29: 500 mg via ORAL
  Filled 2018-06-29: qty 2

## 2018-06-29 MED ORDER — NICOTINE 21 MG/24HR TD PT24
21.0000 mg | MEDICATED_PATCH | Freq: Once | TRANSDERMAL | Status: AC
  Administered 2018-07-01: 21 mg via TRANSDERMAL
  Filled 2018-06-29: qty 1

## 2018-06-29 NOTE — Tx Team (Signed)
Initial Treatment Plan 06/29/2018 5:23 PM Corey BickersDevin D Loa [id number]    PATIENT STRESSORS: Financial difficulties  Children    PATIENT STRENGTHS: Ability for insight Average or above average intelligence Capable of independent living Communication skills Motivation for treatment/growth Supportive family/friends Work skills   PATIENT IDENTIFIED PROBLEMS: Depression  anxiety  Auditory Hallucinations  Homicidal thoughts                DISCHARGE CRITERIA:  Ability to meet basic life and health needs Adequate post-discharge living arrangements Improved stabilization in mood, thinking, and/or behavior  PRELIMINARY DISCHARGE PLAN: Return to previous living arrangement  PATIENT/FAMILY INVOLVEMENT: This treatment plan has been presented to and reviewed with the patient, Corey [name].  The patient has been given the opportunity to ask questions and make suggestions.  Rex KrasJoanne  Jerrica Thorman, RN 06/29/2018, 5:23 PM

## 2018-06-29 NOTE — ED Notes (Signed)
Pt's breakfast tray placed at bedside. 

## 2018-06-29 NOTE — Tx Team (Signed)
Initial Treatment Plan 06/29/2018 2:32 PM Corey BickersDevin D Hauter [id number]    PATIENT STRESSORS: Health problems Substance abuse   PATIENT STRENGTHS: Ability for insight Communication skills Motivation for treatment/growth   PATIENT IDENTIFIED PROBLEMS: Depressive behavior  Ineffective Coping  Disturbed thought process                 DISCHARGE CRITERIA:  Ability to meet basic life and health needs Motivation to continue treatment in a less acute level of care  PRELIMINARY DISCHARGE PLAN: Attend PHP/IOP Return to previous living arrangement Return to previous work or school arrangements  PATIENT/FAMILY INVOLVEMENT: This treatment plan has been presented to and reviewed with the patient, Corey [name], and/or family member.  The patient and family have been given the opportunity to ask questions and make suggestions.  Leamon ArntShatara K [name], RN 06/29/2018, 2:32 PM

## 2018-06-29 NOTE — ED Notes (Signed)
Pt. Up using bathroom. 

## 2018-06-29 NOTE — Progress Notes (Signed)
D - Patient was in his room upon arrival to the unit. He was pleasant during assessment and medication administration. Patient denies SI/HI/AVH and pain. Patient stated his anxiety and depression were 7/10. Patient verbally contracted for safety with this Clinical research associatewriter. Patient was isolative to his room but did come out for snack and medications. Patient was pleasant but and answered questions but stated, "I really just want to get some sleep."   A - Patient was compliant with medication administration per MD orders. Patient was given education. Patient was given support and encouragement.   R - Patient being monitored Q 15 minutes for safety per unit protocol. Patient informed to let staff know if there are any issues or problems. Patient remains safe on the unit.

## 2018-06-29 NOTE — ED Notes (Signed)
Pt asleep in rm, lunch tray placed on chair.
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
## 2018-06-29 NOTE — ED Notes (Signed)
Pt. Patient introduced to unit.  Pt. Advised of cameras and 15 minute safety checks.  Pt. Told to come to this nurse for any problems or concerns.  Pt. Requested and was given remote for room and an additional blanket.  Pt. Is calm and cooperative at this time.

## 2018-06-29 NOTE — BH Assessment (Signed)
Assessment Note  Corey [name] is an 32 y.o. male. Patient has presented voluntarily to the emergency department requesting a psychiatric evaluation. Patient recently discharged earlier yesterday morning and states that he was not completely honest with the psychiatrist at that time. He was seen for a syncopal episodes and reported new onset of hallucinations. At that time he denied SI and HI. On interview with this writer patient now endorses homicidal ideations. He reports that he has thought of harming both his sister's boyfriend and the mother of his children. Patient states "I don't think that I would ever do that." He reports a history of the bipolar disorder and PTSD. He also reports a history of cocaine and alcohol abuse, reportedly in remission. He reports that he has a long history of experiencing both auditory and visual hallucinations, in the form whispers and seeing his deceased uncle and father in his living room. Pt reports an inability to maintain restful sleep, even with the help of sleep aid. At this time the patient denies any suicidal plan or intent. Pt states "I do have those thoughts from time to time." Patient reports that he decided to seek help because "it's getting to be too much." He denied any previous inpatient admissions but states that he is currently receiving outpatient treatment at [facility] located in [place]. Patient presents as Cooperative, his affect is constricted. thought process is linear although he admits experiencing paranoid delusions.   Diagnosis: Bipolar Disorder  Past Medical History:  Past Medical History:  Diagnosis Date  . Bipolar 1 disorder (HCC)   . GERD (gastroesophageal reflux disease)   . HTN (hypertension)   . 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 ESOPHAGOGASTRODUODENOSCOPY (EGD) WITH PROPOFOL N/A 04/26/2018   Procedure:  ESOPHAGOGASTRODUODENOSCOPY (EGD) WITH PROPOFOL;  Surgeon: Wyline Mood, MD;  Location: [facility];  Service: Gastroenterology;  Laterality: N/A;  . KNEE ARTHROSCOPY    . PACEMAKER INSERTION Left 05/15/2018   Procedure: INSERTION PACEMAKER;  Surgeon: Marcina Millard, MD;  Location: [facility] ORS;  Service: Cardiovascular;  Laterality: Left;    Family History:  Family History  Problem Relation Age of Onset  . Kidney cancer Neg Hx   . Bladder Cancer Neg Hx   . Prostate cancer Neg Hx     Social History:  reports that he has quit smoking. His smokeless tobacco use includes snuff. He reports that he has current or past drug history. Drug: Marijuana. He reports that he does not drink alcohol.  Additional Social History:  Alcohol / Drug Use Pain Medications: SEE MAR Prescriptions: SEE MAR Over the Counter: None reported History of alcohol / drug use?: Yes Substance #1 Name of Substance 1: Cocaine 1 - Age of First Use: uknown  1 - Amount (size/oz): none  1 - Frequency: none  1 - Duration: several years, sober 10 years now  1 - Last Use / Amount: 2009  CIWA: CIWA-Ar BP: 131/84 Pulse Rate: 78 COWS:    Allergies:  Allergies  Allergen Reactions  . Penicillins Hives    Has patient had a PCN reaction causing immediate rash, facial/tongue/throat swelling, SOB or lightheadedness with hypotension: Yes Has patient had a PCN reaction causing severe rash involving mucus membranes or skin necrosis: No Has patient had a PCN reaction that required hospitalization: No Has patient had a PCN reaction occurring within the last 10 years: No If all of the above answers are "  NO", then may proceed with Cephalosporin use.   . Sulfa Antibiotics Hives    Home Medications:  (Not in a hospital admission)  OB/GYN Status:  No LMP for male patient.  General Assessment Data [facility] TTS Assessment: In system Is this a Tele or Face-to-Face Assessment?: Face-to-Face Is this an  Initial Assessment or a Re-assessment for this encounter?: Initial Assessment Patient Accompanied by:: N/A Language Other than English: No Living Arrangements: Other (Comment) What gender do you identify as?: Male Marital status: Single Living Arrangements: Alone Can pt return to current living arrangement?: Yes Admission Status: Voluntary Is patient capable of signing voluntary admission?: Yes Referral Source: Self/Family/Friend Insurance type: BCBS  Medical Screening Exam [facility] Walk-in ONLY) Medical Exam completed: Yes  Crisis Care Plan Living Arrangements: Alone Legal Guardian: (Self) Name of Psychiatrist: [facility]  Name of Therapist: [facility]   Education Status Is patient currently in school?: No Is the patient employed, unemployed or receiving disability?: Unemployed  Risk to self with the past 6 months Suicidal Ideation: No Has patient been a risk to self within the past 6 months prior to admission? : Yes Suicidal Intent: No Has patient had any suicidal intent within the past 6 months prior to admission? : No Is patient at risk for suicide?: No Suicidal Plan?: No-Not Currently/Within Last 6 Months Has patient had any suicidal plan within the past 6 months prior to admission? : No Access to Means: No What has been your use of drugs/alcohol within the last 12 months?: n Previous Attempts/Gestures: No How many times?: 0 Other Self Harm Risks: none Triggers for Past Attempts: None known Intentional Self Injurious Behavior: Cutting Comment - Self Injurious Behavior: cutting Family Suicide History: No Recent stressful life event(s): Recent negative physical changes Persecutory voices/beliefs?: Yes Depression: Yes Depression Symptoms: Feeling worthless/self pity, Isolating, Insomnia Substance abuse history and/or treatment for substance abuse?: Yes Suicide prevention information given to non-admitted patients: Yes  Risk to Others within the past 6  months Homicidal Ideation: Yes-Currently Present Does patient have any lifetime risk of violence toward others beyond the six months prior to admission? : No Thoughts of Harm to Others: No-Not Currently Present/Within Last 6 Months Current Homicidal Intent: No Current Homicidal Plan: No Access to Homicidal Means: No Identified Victim: sister boyfriend and mother of children History of harm to others?: No Assessment of Violence: None Noted Violent Behavior Description: no Does patient have access to weapons?: No Criminal Charges Pending?: No Does patient have a court date: No Is patient on probation?: No  Psychosis Hallucinations: Auditory, Olfactory Delusions: Persecutory  Mental Status Report Appearance/Hygiene: In scrubs Eye Contact: Fair Motor Activity: Freedom of movement Speech: Slow Level of Consciousness: Alert Mood: Anxious Affect: Flat, Constricted Anxiety Level: Minimal Thought Processes: Coherent Judgement: Partial Orientation: Time, Situation, Place, Person Obsessive Compulsive Thoughts/Behaviors: None  Cognitive Functioning Concentration: Fair Memory: Recent Intact, Remote Intact Is patient IDD: No Insight: Poor Impulse Control: Poor Appetite: Fair Have you had any weight changes? : No Change Sleep: Decreased Total Hours of Sleep: 4 Vegetative Symptoms: None  ADLScreening [facility] Assessment Services) Patient's cognitive ability adequate to safely complete daily activities?: Yes Patient able to express need for assistance with ADLs?: Yes Independently performs ADLs?: Yes (appropriate for developmental age)  Prior Inpatient Therapy Prior Inpatient Therapy: No  Prior Outpatient Therapy Prior Outpatient Therapy: Yes Prior Therapy Dates: Current Prior Therapy Facilty/Provider(s): [facility] Reason for Treatment: Bipolar  Does patient have an ACCT team?:  No Does patient have Intensive In-House Services?  : No Does patient have [facility] services?  : No Does patient have P4CC services?: No  ADL Screening (condition at time of admission) Patient's cognitive ability adequate to safely complete daily activities?: Yes Patient able to express need for assistance with ADLs?: Yes Independently performs ADLs?: Yes (appropriate for developmental age)       Abuse/Neglect Assessment (Assessment to be complete while patient is alone) Abuse/Neglect Assessment Can Be Completed: Yes Physical Abuse: Denies Verbal Abuse: Denies Sexual Abuse: Denies Exploitation of patient/patient's resources: Denies Self-Neglect: Denies Values / Beliefs Cultural Requests During Hospitalization: None Consults Spiritual Care Consult Needed: No Social Work Consult Needed: No            Disposition:  Disposition Initial Assessment Completed for this Encounter: Yes Patient referred to: Other (Comment)(Consult with [facility] )  On Site Evaluation by:   Reviewed with Physician:    Asa Saunas 06/29/2018 12:57 AM

## 2018-06-29 NOTE — ED Notes (Signed)
Pt. Indicated he was feeling anxious.

## 2018-06-29 NOTE — ED Notes (Signed)
Pt transported to lower level BMU.  1:1 belongings bag sent with patient.  Pt stable at time of transport.  Denies SI and HI.  States he does here voices that come and go.

## 2018-06-29 NOTE — Progress Notes (Addendum)
Received Diontae  From the ED, alert and oriented x4. He is pleasant and cooperative with the admission precess. He stated feeling like he was having a mental breakdown related to hearing voices, grieving his past losses and thoughts of homicidal acts. He is currently feeling depressed and anxious, but denied feeling suicidal and hearing voices at the He has past present time. He has a past history of Bipolar 1, PTSD, anxiety and depression. He has a pacer since 05/15/2018.The pacer is [employer]tr[employer], serial number [id number]. It is Azure XTDRMRI sure scan implant. It is set in the 60's. He stated the pacer was a result of bradycardia of unknown etiology.The skin assessment was completed, oriented to his new environment and ate dinner. [place] to [facility] [place] and [place]  By therapist Shanda BumpsJessica and NP- Annice PihJackie.

## 2018-06-29 NOTE — Plan of Care (Signed)
Patient is newly admitted to the unit, hasn't had time to progress.   Problem: Education: Goal: Knowledge of [place] General Education information/materials will improve Outcome: Not Progressing Goal: Emotional status will improve Outcome: Not Progressing Goal: Mental status will improve Outcome: Not Progressing Goal: Verbalization of understanding the information provided will improve Outcome: Not Progressing   Problem: Coping: Goal: Coping ability will improve Outcome: Not Progressing Goal: Will verbalize feelings Outcome: Not Progressing   Problem: Education: Goal: Knowledge of General Education information will improve Description Including pain rating scale, medication(s)/side effects and non-pharmacologic comfort measures Outcome: Not Progressing   Problem: Health Behavior/Discharge Planning: Goal: Ability to manage health-related needs will improve Outcome: Not Progressing   Problem: Clinical Measurements: Goal: Ability to maintain clinical measurements within normal limits will improve Outcome: Not Progressing Goal: Will remain free from infection Outcome: Not Progressing Goal: Diagnostic test results will improve Outcome: Not Progressing Goal: Respiratory complications will improve Outcome: Not Progressing Goal: Cardiovascular complication will be avoided Outcome: Not Progressing   Problem: Activity: Goal: Risk for activity intolerance will decrease Outcome: Not Progressing   Problem: Nutrition: Goal: Adequate nutrition will be maintained Outcome: Not Progressing   Problem: Coping: Goal: Level of anxiety will decrease Outcome: Not Progressing   Problem: Elimination: Goal: Will not experience complications related to bowel motility Outcome: Not Progressing Goal: Will not experience complications related to urinary retention Outcome: Not Progressing   Problem: Pain Managment: Goal: General experience of comfort will improve Outcome: Not  Progressing   Problem: Safety: Goal: Ability to remain free from injury will improve Outcome: Not Progressing   Problem: Skin Integrity: Goal: Risk for impaired skin integrity will decrease Outcome: Not Progressing

## 2018-06-29 NOTE — BH Assessment (Signed)
Patient is to be [facility] BMU by Dr. He.  Attending Physician will be Dr. Jennet MaduroPucilowska.   Patient has been assigned to room 309, by [facility] Charge Nurse Shatara.   Intake Paper Work has been signed and placed on patient chart.  ER staff is aware of the admission:  Misty StanleyLisa, ER Secretary    Dr. Cyril LoosenKinner, ER MD   Prudencio Burlyhea, Patient's Nurse   Ethelene BrownsAnthony, Patient Access

## 2018-06-29 NOTE — ED Provider Notes (Signed)
SOC recommends inpatient admission voluntarily.  Continue home meds.   Merrily Brittleifenbark, Matthew Pais, MD 06/29/18 0030

## 2018-06-30 ENCOUNTER — Encounter: Payer: Self-pay | Admitting: Psychiatry

## 2018-06-30 LAB — HEPATIC FUNCTION PANEL
ALT: 19 U/L (ref 0–44)
AST: 19 U/L (ref 15–41)
Albumin: 3.8 g/dL (ref 3.5–5.0)
Alkaline Phosphatase: 63 U/L (ref 38–126)
Bilirubin, Direct: <0.1 mg/dL (ref 0.0–0.2)
Total Bilirubin: 0.5 mg/dL (ref 0.3–1.2)
Total Protein: 7 g/dL (ref 6.5–8.1)

## 2018-06-30 LAB — TSH: TSH: 1.055 u[IU]/mL (ref 0.350–4.500)

## 2018-06-30 MED ORDER — RISPERIDONE 1 MG PO TABS
2.0000 mg | ORAL_TABLET | Freq: Every day | ORAL | Status: DC
  Administered 2018-06-30: 2 mg via ORAL
  Filled 2018-06-30: qty 2

## 2018-06-30 NOTE — Progress Notes (Signed)
Recreation Therapy Notes  INPATIENT RECREATION THERAPY ASSESSMENT  Patient Details Name: Corey BickersDevin D [name] MRN: [id number] DOB: 24-Oct-1985 Today's Date: 06/30/2018       Information Obtained From: Patient  Able to Participate in Assessment/Interview: Yes  Patient Presentation: Responsive  Reason for Admission (Per Patient): Active Symptoms, Other (Comments)(Hearing voices)  Patient Stressors: Other (Comment), Relationship, Family(health)  Coping Skills:   Meditate, Other (Comment)(Walk)  Leisure Interests (2+):  Music - Play instrument(Play with my dog)  Frequency of Recreation/Participation: Monthly  Awareness of Community Resources:  Yes  Community Resources:  Park  Current Use:    If no, Barriers?:    Expressed Interest in [employer] Resource Information:    [place]  Patient Main Form of Transportation: Other (Comment)(Family)  Patient Strengths:  helping others  Patient Identified Areas of Improvement:  not be so antisocial  Patient Goal for Hospitalization:  To get to a stable mental place  Current SI (including self-harm):  No  Current HI:  No  Current AVH: No  Staff Intervention Plan: Group Attendance, Collaborate with Interdisciplinary Treatment Team  Consent to Intern Participation: N/A  Geanette Buonocore 06/30/2018, 12:39 PM

## 2018-06-30 NOTE — Plan of Care (Signed)
D: Pt denies SI/HI. Patient with complaints of auditory hallucinations. Pt is pleasant and cooperative. Pt has been in milieu to day for a short while. Patient interacts with staff. Patient has spent a portion of his time this morning in bed. A: Pt was offered support and encouragement. Pt was given scheduled medications. Pt was encourage to attend groups. Q 15 minute checks were done for safety.  R: Pt is taking medication. Pt has no complaints.Pt receptive to treatment and safety maintained on unit.    Problem: Education: Goal: Knowledge of [place] General Education information/materials will improve Outcome: Progressing Goal: Emotional status will improve Outcome: Progressing Goal: Mental status will improve Outcome: Progressing Goal: Verbalization of understanding the information provided will improve Outcome: Progressing   Problem: Coping: Goal: Coping ability will improve Outcome: Progressing Goal: Will verbalize feelings Outcome: Progressing   Problem: Education: Goal: Knowledge of General Education information will improve Description Including pain rating scale, medication(s)/side effects and non-pharmacologic comfort measures Outcome: Progressing   Problem: Health Behavior/Discharge Planning: Goal: Ability to manage health-related needs will improve Outcome: Progressing   Problem: Nutrition: Goal: Adequate nutrition will be maintained Outcome: Progressing   Problem: Coping: Goal: Level of anxiety will decrease Outcome: Progressing   Problem: Safety: Goal: Ability to remain free from injury will improve Outcome: Progressing

## 2018-06-30 NOTE — [facility] Group Notes (Signed)
[facility] LCSW Group Therapy Note  Date/Time: 06/30/18, 1215  Type of Therapy/Topic:  Group Therapy:  Balance in Life  Participation Level:  Did not attend  Description of Group:    This group will address the concept of balance and how it feels and looks when one is unbalanced. Patients will be encouraged to process areas in their lives that are out of balance, and identify reasons for remaining unbalanced. Facilitators will guide patients utilizing problem- solving interventions to address and correct the stressor making their life unbalanced. Understanding and applying boundaries will be explored and addressed for obtaining  and maintaining a balanced life. Patients will be encouraged to explore ways to assertively make their unbalanced needs known to significant others in their lives, using other group members and facilitator for support and feedback.  Therapeutic Goals: 1. Patient will identify two or more emotions or situations they have that consume much of in their lives. 2. Patient will identify signs/triggers that life has become out of balance:  3. Patient will identify two ways to set boundaries in order to achieve balance in their lives:  4. Patient will demonstrate ability to communicate their needs through discussion and/or role plays  Summary of Patient Progress:          Therapeutic Modalities:   Cognitive Behavioral Therapy Solution-Focused Therapy Assertiveness Training  Greg Jeri Rawlins, LCSW 

## 2018-06-30 NOTE — Progress Notes (Signed)
Recreation Therapy Notes   Date: 06/30/2018  Time: 9:30 am  Location: Craft Room  Behavioral response: Appropriate   Intervention Topic: Time Management  Discussion/Intervention:  Group content today was focused on time management. The group defined time management and identified healthy ways to manage time. Individuals expressed how much of the 24 hours they use in a day. Patients expressed how much time they use just for themselves personally. The group expressed how they have managed their time in the past. Individuals participated in the intervention "Managing Life" where they had a chance to see how much of the 24 hours they use and where it goes. Clinical Observations/Feedback:  Patient came to group and defined time management as setting a schedule. He stated that he normally only uses tweleve hours of her day being productive. Individual was social with peers and staff while participating in the intervention. Mattelyn Imhoff LRT/CTRS         Daryon Remmert 06/30/2018 11:25 AM

## 2018-06-30 NOTE — Progress Notes (Addendum)
[facility] MD Progress Note  06/30/2018 5:51 AM Corey [name]   MRN:  [id number]  Subjective:    Corey [name] has a history of bipolar admitted for suicidal and homicidal ideation and vague hallucinations after separating from his wife in the summer. H/O bradycardia with pacemaker.   Corey [name] met with treatment team. He appears depressed with severe psychomotor retardation. He still feels suicidal but no longer homicidal. He became more depressed after his wife divorced him in the summer and he no longer can see his 53-monthold baby. He reports vague hallucinations, hearing whispers and seeing dead people, "since a child". He was started on Risperdal which he tolerates well so far.   Principal Problem: Bipolar 1 disorder ([place] Diagnosis: Principal Problem:   Bipolar 1 disorder most recent episode depressed with psychotic features Active Problems:   Psychosis ([place]  Total Time spent with patient: 20 minutes  Past Psychiatric History: bipolar disorder  Past Medical History:  Past Medical History:  Diagnosis Date  . Bipolar 1 disorder ([place]   . GERD (gastroesophageal reflux disease)   . HTN (hypertension)   . 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 PSaralyn Pilar   Past Surgical History:  Procedure Laterality Date  . ESOPHAGOGASTRODUODENOSCOPY (EGD) WITH PROPOFOL N/A 04/26/2018   Procedure: ESOPHAGOGASTRODUODENOSCOPY (EGD) WITH PROPOFOL;  Surgeon: AJonathon Bellows MD;  [facility];  Service: Gastroenterology;  Laterality: N/A;  . KNEE ARTHROSCOPY    . PACEMAKER INSERTION Left 05/15/2018   Procedure: INSERTION PACEMAKER;  Surgeon: PIsaias Cowman MD;  Location: [facility] ORS;  Service: Cardiovascular;  Laterality: Left;   Family History:  Family History  Problem Relation Age of Onset  . Kidney cancer Neg Hx   . Bladder Cancer Neg Hx   . Prostate cancer Neg Hx    Family Psychiatric  History: see H&P Social History:  Social History   Substance and  Sexual Activity  Alcohol Use No     Social History   Substance and Sexual Activity  Drug Use Not Currently  . Types: Marijuana    Social History   Socioeconomic History  . Marital status: Legally Separated    Spouse name: Not on file  . Number of children: 3  . Years of education: Not on file  . Highest education level: Not on file  Occupational History  . Not on file  Social Needs  . Financial resource strain: Not hard at all  . Food insecurity:    Worry: Never true    Inability: Never true  . Transportation needs:    Medical: No    Non-medical: No  Tobacco Use  . Smoking status: Former SResearch scientist (life sciences) . Smokeless tobacco: Current User    Types: Snuff  Substance and Sexual Activity  . Alcohol use: No  . Drug use: Not Currently    Types: Marijuana  . Sexual activity: Not on file  Lifestyle  . Physical activity:    Days per week: 7 days    Minutes per session: 60 min  . Stress: Not at all  Relationships  . Social connections:    Talks on phone: More than three times a week    Gets together: More than three times a week    Attends religious service: Never    Active member of club or organization: No    Attends meetings of clubs or organizations: Never    Relationship status: Separated  Other Topics Concern  . Not on file  Social History Narrative  .  Not on file   Additional Social History:                         Sleep: Fair  Appetite:  Fair  Current Medications: Current Facility-Administered Medications  Medication Dose Route Frequency Provider Last Rate Last Dose  . acetaminophen (TYLENOL) tablet 650 mg  650 mg Oral Q6H PRN He, Jun, MD      . alum & mag hydroxide-simeth (MAALOX/MYLANTA) 200-200-20 MG/5ML suspension 30 mL  30 mL Oral Q4H PRN He, Jun, MD      . clonazePAM Bobbye Charleston) tablet 0.5 mg  0.5 mg Oral BID PRN He, Jun, MD      . cloNIDine (CATAPRES) tablet 0.1 mg  0.1 mg Oral TID He, Jun, MD   0.1 mg at 06/29/18 1821  . divalproex (DEPAKOTE  ER) 24 hr tablet 1,000 mg  1,000 mg Oral QHS He, Jun, MD   1,000 mg at 06/29/18 2111  . FLUoxetine (PROZAC) capsule 40 mg  40 mg Oral Daily He, Jun, MD      . hydrOXYzine (ATARAX/VISTARIL) tablet 25 mg  25 mg Oral TID PRN He, Jun, MD      . magnesium hydroxide (MILK OF MAGNESIA) suspension 30 mL  30 mL Oral Daily PRN He, Jun, MD      . midodrine (PROAMATINE) tablet 2.5 mg  2.5 mg Oral BID He, Jun, MD      . nicotine (NICODERM CQ - dosed in mg/24 hours) patch 21 mg  21 mg Transdermal Once He, Jun, MD      . pantoprazole (PROTONIX) EC tablet 40 mg  40 mg Oral Daily He, Jun, MD      . risperiDONE (RISPERDAL) tablet 1 mg  1 mg Oral QHS He, Jun, MD   1 mg at 06/29/18 2111    Lab Results:  Results for orders placed or performed during the hospital encounter of 06/28/18 (from the past 48 hour(s))  CBC     Status: None   Collection Time: 06/28/18  7:22 PM  Result Value Ref Range   WBC 8.0 4.0 - 10.5 K/uL   RBC 5.26 4.22 - 5.81 MIL/uL   Hemoglobin 15.3 13.0 - 17.0 g/dL   HCT 45.5 39.0 - 52.0 %   MCV 86.5 80.0 - 100.0 fL   MCH 29.1 26.0 - 34.0 pg   MCHC 33.6 30.0 - 36.0 g/dL   RDW 12.8 11.5 - 15.5 %   Platelets 277 150 - 400 K/uL   nRBC 0.0 0.0 - 0.2 %    Comment: Performed at [facility], [address] [place], [place] [address]  Comprehensive metabolic panel     Status: Abnormal   Collection Time: 06/28/18  7:22 PM  Result Value Ref Range   Sodium 142 135 - 145 mmol/L   Potassium 3.8 3.5 - 5.1 mmol/L    Comment: HEMOLYSIS AT THIS LEVEL MAY AFFECT RESULT   Chloride 107 98 - 111 mmol/L   CO2 26 22 - 32 mmol/L   Glucose, Bld 132 (H) 70 - 99 mg/dL   BUN 10 6 - 20 mg/dL   Creatinine, Ser 0.89 0.61 - 1.24 mg/dL   Calcium 8.5 (L) 8.9 - 10.3 mg/dL   Total Protein 6.6 6.5 - 8.1 g/dL   Albumin 3.6 3.5 - 5.0 g/dL   AST 16 15 - 41 U/L   ALT 18 0 - 44 U/L   Alkaline Phosphatase 72 38 - 126 U/L  Total Bilirubin 0.5 0.3 - 1.2 mg/dL   GFR calc non Af Amer >60 >60 mL/min   GFR calc  Af Amer >60 >60 mL/min   Anion gap 9 5 - 15    Comment: Performed at [facility], [address] [place], [place] [address]  Urinalysis, Complete w Microscopic     Status: Abnormal   Collection Time: 06/28/18  7:22 PM  Result Value Ref Range   Color, Urine YELLOW (A) YELLOW   APPearance CLEAR (A) CLEAR   Specific Gravity, Urine 1.024 1.005 - 1.030   pH 6.0 5.0 - 8.0   Glucose, UA NEGATIVE NEGATIVE mg/dL   Hgb urine dipstick NEGATIVE NEGATIVE   Bilirubin Urine NEGATIVE NEGATIVE   Ketones, ur 5 (A) NEGATIVE mg/dL   Protein, ur NEGATIVE NEGATIVE mg/dL   Nitrite NEGATIVE NEGATIVE   Leukocytes, UA NEGATIVE NEGATIVE   RBC / HPF 0-5 0 - 5 RBC/hpf   WBC, UA 0-5 0 - 5 WBC/hpf   Bacteria, UA NONE SEEN NONE SEEN   Squamous Epithelial / LPF 0-5 0 - 5   Mucus PRESENT    Ca Oxalate Crys, UA PRESENT     Comment: Performed at [facility], [address], [place], [place] [address]  Urine Drug Screen, Qualitative ([facility] only)     Status: Abnormal   Collection Time: 06/28/18  7:22 PM  Result Value Ref Range   Tricyclic, Ur Screen NONE DETECTED NONE DETECTED   Amphetamines, Ur Screen NONE DETECTED NONE DETECTED   MDMA (Ecstasy)Ur Screen NONE DETECTED NONE DETECTED   Cocaine Metabolite,Ur [place] NONE DETECTED NONE DETECTED   Opiate, Ur Screen NONE DETECTED NONE DETECTED   Phencyclidine (PCP) Ur S NONE DETECTED NONE DETECTED   Cannabinoid 50 Ng, Ur  NONE DETECTED NONE DETECTED   Barbiturates, Ur Screen NONE DETECTED NONE DETECTED   Benzodiazepine, Ur Scrn POSITIVE (A) NONE DETECTED   Methadone Scn, Ur NONE DETECTED NONE DETECTED    Comment: (NOTE) Tricyclics + metabolites, urine    Cutoff 1000 ng/mL Amphetamines + metabolites, urine  Cutoff 1000 ng/mL MDMA (Ecstasy), urine              Cutoff 500 ng/mL Cocaine Metabolite, urine          Cutoff 300 ng/mL Opiate + metabolites, urine        Cutoff 300 ng/mL Phencyclidine (PCP), urine         Cutoff 25 ng/mL Cannabinoid, urine                  Cutoff 50 ng/mL Barbiturates + metabolites, urine  Cutoff 200 ng/mL Benzodiazepine, urine              Cutoff 200 ng/mL Methadone, urine                   Cutoff 300 ng/mL The urine drug screen provides only a preliminary, unconfirmed analytical test result and should not be used for non-medical purposes. Clinical consideration and professional judgment should be applied to any positive drug screen result due to possible interfering substances. A more specific alternate chemical method must be used in order to obtain a confirmed analytical result. Gas chromatography / mass spectrometry (GC/MS) is the preferred confirmat ory method. Performed at [facility], [address] [place], [place] [address]   Ethanol     Status: None   Collection Time: 06/28/18  7:22 PM  Result Value Ref Range   Alcohol, Ethyl (B) <10 <10  mg/dL    Comment: (NOTE) Lowest detectable limit for serum alcohol is 10 mg/dL. For medical purposes only. Performed at [facility] [address] [place], [place] [address]     Blood Alcohol level:  Lab Results  Component Value Date   [facility] <10 06/28/2018   ETH <10 95/18/8416    Metabolic Disorder Labs: No results found for: HGBA1C, MPG No results found for: PROLACTIN No results found for: CHOL, TRIG, HDL, CHOLHDL, VLDL, LDLCALC  Physical Findings: AIMS:  , ,  ,  ,    CIWA:    COWS:     Musculoskeletal: Strength & Muscle Tone: within normal limits Gait & Station: normal Patient leans: N/A  Psychiatric Specialty Exam: Physical Exam  Nursing note and vitals reviewed. Psychiatric: His speech is normal. His affect is blunt. He is withdrawn. Cognition and memory are normal. He expresses impulsivity. He exhibits a depressed mood. He expresses suicidal ideation.    Review of Systems  Neurological: Negative.   Psychiatric/Behavioral: Positive for depression and suicidal ideas. The patient has insomnia.   All other  systems reviewed and are negative.   Blood pressure 108/67, pulse 74, temperature 98.4 F (36.9 C), temperature source Oral, resp. rate 18, height '5\' 11"'$  (1.803 m), weight 95.7 kg, SpO2 99 %.Body mass index is 29.43 kg/m.  General Appearance: Casual  Eye Contact:  Good  Speech:  Clear and Coherent  Volume:  Normal  Mood:  Anxious and Depressed  Affect:  Flat  Thought Process:  Goal Directed and Descriptions of Associations: Intact  Orientation:  Full (Time, Place, and Person)  Thought Content:  Hallucinations: Auditory Visual  Suicidal Thoughts:  Yes.  with intent/plan  Homicidal Thoughts:  No  Memory:  Immediate;   Fair Recent;   Fair Remote;   Fair  Judgement:  Poor  Insight:  Lacking  Psychomotor Activity:  Psychomotor Retardation  Concentration:  Concentration: Fair and Attention Span: Fair  Recall:  [employer] of Knowledge:  Fair  Language:  Fair  Akathisia:  No  Handed:  Right  AIMS (if indicated):     Assets:  Communication Skills Desire for Improvement Resilience  ADL's:  Intact  Cognition:  WNL  Sleep:        Treatment Plan Summary: Daily contact with patient to assess and evaluate symptoms and progress in treatment and Medication management   Mr. Bressman is a 32 year old male with a history of depression and PTSD admitted for suicidal/homicidal ideation and hallucinations.  # Bipolar with psychotic features.  -increase Depakote to 1000 mg nightly ( VPA trough was 57 in ED)  -increase Risperdal to 2 mg nightly for hallucinations. His QTc is 511, but pt said that he has a pacemaker.  -continue Prozac '40mg'$  daily.   #Anxiety -patient recently started on Xanax at home but due to history of alcoholism Dr. He discontinued Xanax -Clonazepam 0.5 mg BID available for withdrawal symptoms -VS are stable  # r/o PTSD -pt has hx of trauma exposure, but no trauma treatment.  -his Sx is somewhat related to the trauma. Please monitor.   # Remote hx of alcohol,  cocaine and cannabis use disorder,  -currently in remission.   -need to avoid BZD. He is prescribed with xanax '1mg'$  BID, recently, and was on Klonopin '1mg'$  daily for 6 months before that.  Will not restart standing xanax, but will provide prn for withdrawal  # Bradycardia -pt said that he has a pacemaker -monitor if symptomatic  #GERD -Protonix  40 mg daily  #BP problems, under cardilogy care -Clonidine 0.1 mg TID -Midodrin 2.5 mg BID  #Smoking cessation -nicotine patch is available  # Labs -lipid panel, TSH, A1C are normal  #Dispo -discharge to his apartment -follow up with [facility]   Orson Slick, MD 06/30/2018, 5:51 AM

## 2018-06-30 NOTE — Tx Team (Addendum)
Interdisciplinary Treatment and Diagnostic Plan Update  06/30/2018 Time of Session: 1030am Corey [name] MRN: [id number]  Principal Diagnosis: Bipolar 1 disorder (HCC)  Secondary Diagnoses: Principal Problem:   Bipolar 1 disorder most recent episode depressed with psychotic features Active Problems:   Psychosis (HCC)   Current Medications:  Current Facility-Administered Medications  Medication Dose Route Frequency Provider Last Rate Last Dose  . acetaminophen (TYLENOL) tablet 650 mg  650 mg Oral Q6H PRN He, Jun, MD      . alum & mag hydroxide-simeth (MAALOX/MYLANTA) 200-200-20 MG/5ML suspension 30 mL  30 mL Oral Q4H PRN He, Jun, MD      . clonazePAM Scarlette Calico) tablet 0.5 mg  0.5 mg Oral BID PRN He, Jun, MD      . cloNIDine (CATAPRES) tablet 0.1 mg  0.1 mg Oral TID He, Jun, MD   0.1 mg at 06/30/18 1212  . divalproex (DEPAKOTE ER) 24 hr tablet 1,000 mg  1,000 mg Oral QHS He, Jun, MD   1,000 mg at 06/29/18 2111  . FLUoxetine (PROZAC) capsule 40 mg  40 mg Oral Daily He, Jun, MD   40 mg at 06/30/18 0801  . hydrOXYzine (ATARAX/VISTARIL) tablet 25 mg  25 mg Oral TID PRN He, Jun, MD      . magnesium hydroxide (MILK OF MAGNESIA) suspension 30 mL  30 mL Oral Daily PRN He, Jun, MD      . midodrine (PROAMATINE) tablet 2.5 mg  2.5 mg Oral BID He, Jun, MD   2.5 mg at 06/30/18 0802  . nicotine (NICODERM CQ - dosed in mg/24 hours) patch 21 mg  21 mg Transdermal Once He, Jun, MD      . pantoprazole (PROTONIX) EC tablet 40 mg  40 mg Oral Daily He, Jun, MD   40 mg at 06/30/18 0801  . risperiDONE (RISPERDAL) tablet 1 mg  1 mg Oral QHS He, Jun, MD   1 mg at 06/29/18 2111   PTA Medications: Medications Prior to Admission  Medication Sig Dispense Refill Last Dose  . clonazePAM (KLONOPIN) 1 MG tablet Take 1 mg by mouth daily as needed for anxiety.    UNKNOWN at UNKNOWN  . cloNIDine (CATAPRES) 0.1 MG tablet Take 0.1 mg by mouth 3 (three) times daily.   UNKNOWN at UNKNOWN  . divalproex (DEPAKOTE ER) 500 MG  24 hr tablet Take 500 mg by mouth at bedtime.   1 05/28/2018 at Unknown time  . FLUoxetine (PROZAC) 40 MG capsule Take 40 mg by mouth daily.  0 05/28/2018 at Unknown time  . midodrine (PROAMATINE) 2.5 MG tablet Take 2.5 mg by mouth 2 (two) times daily.   UNKNOWN at UNKNOWN  . nitroGLYCERIN (NITROSTAT) 0.4 MG SL tablet Place 1 tablet (0.4 mg total) under the tongue every 5 (five) minutes as needed for chest pain. 10 tablet 0   . pantoprazole (PROTONIX) 40 MG tablet Take 1 tablet (40 mg total) by mouth daily. 30 tablet 1 05/28/2018 at Unknown time  . sucralfate (CARAFATE) 1 g tablet Take 1 tablet (1 g total) by mouth 4 (four) times daily. 60 tablet 0 05/28/2018 at Unknown time    Patient Stressors: Financial difficulties  Patient Strengths: Ability for insight Average or above average intelligence Capable of independent living Communication skills Motivation for treatment/growth Supportive family/friends Work skills  Treatment Modalities: Medication Management, Group therapy, Case management,  1 to 1 session with clinician, Psychoeducation, Recreational therapy.   Physician Treatment Plan for Primary Diagnosis: Bipolar 1 disorder (HCC) Long  Term Goal(s): Improvement in symptoms so as ready for discharge Improvement in symptoms so as ready for discharge   Short Term Goals: Ability to identify changes in lifestyle to reduce recurrence of condition will improve Ability to verbalize feelings will improve Ability to disclose and discuss suicidal ideas Ability to demonstrate self-control will improve Ability to identify and develop effective coping behaviors will improve Ability to maintain clinical measurements within normal limits will improve Compliance with prescribed medications will improve Ability to identify triggers associated with substance abuse/mental health issues will improve Ability to identify changes in lifestyle to reduce recurrence of condition will improve Ability to  verbalize feelings will improve Ability to disclose and discuss suicidal ideas Ability to demonstrate self-control will improve Ability to identify and develop effective coping behaviors will improve Ability to maintain clinical measurements within normal limits will improve Compliance with prescribed medications will improve  Medication Management: Evaluate patient's response, side effects, and tolerance of medication regimen.  Therapeutic Interventions: 1 to 1 sessions, Unit Group sessions and Medication administration.  Evaluation of Outcomes: Progressing  Physician Treatment Plan for Secondary Diagnosis: Principal Problem:   Bipolar 1 disorder most recent episode depressed with psychotic features Active Problems:   Psychosis (HCC)  Long Term Goal(s): Improvement in symptoms so as ready for discharge Improvement in symptoms so as ready for discharge   Short Term Goals: Ability to identify changes in lifestyle to reduce recurrence of condition will improve Ability to verbalize feelings will improve Ability to disclose and discuss suicidal ideas Ability to demonstrate self-control will improve Ability to identify and develop effective coping behaviors will improve Ability to maintain clinical measurements within normal limits will improve Compliance with prescribed medications will improve Ability to identify triggers associated with substance abuse/mental health issues will improve Ability to identify changes in lifestyle to reduce recurrence of condition will improve Ability to verbalize feelings will improve Ability to disclose and discuss suicidal ideas Ability to demonstrate self-control will improve Ability to identify and develop effective coping behaviors will improve Ability to maintain clinical measurements within normal limits will improve Compliance with prescribed medications will improve     Medication Management: Evaluate patient's response, side effects, and  tolerance of medication regimen.  Therapeutic Interventions: 1 to 1 sessions, Unit Group sessions and Medication administration.  Evaluation of Outcomes: Progressing   RN Treatment Plan for Primary Diagnosis: Bipolar 1 disorder (HCC) Long Term Goal(s): Knowledge of disease and therapeutic regimen to maintain health will improve  Short Term Goals: Ability to verbalize frustration and anger appropriately will improve, Ability to demonstrate self-control, Ability to participate in decision making will improve, Ability to verbalize feelings will improve, Ability to disclose and discuss suicidal ideas, Ability to identify and develop effective coping behaviors will improve and Compliance with prescribed medications will improve  Medication Management: RN will administer medications as ordered by provider, will assess and evaluate patient's response and provide education to patient for prescribed medication. RN will report any adverse and/or side effects to prescribing provider.  Therapeutic Interventions: 1 on 1 counseling sessions, Psychoeducation, Medication administration, Evaluate responses to treatment, Monitor vital signs and CBGs as ordered, Perform/monitor CIWA, COWS, AIMS and Fall Risk screenings as ordered, Perform wound care treatments as ordered.  Evaluation of Outcomes: Progressing   LCSW Treatment Plan for Primary Diagnosis: Bipolar 1 disorder (HCC) Long Term Goal(s): Safe transition to appropriate next level of care at discharge, Engage patient in therapeutic group addressing interpersonal concerns.  Short Term Goals: Engage patient in aftercare planning  with referrals and resources, Increase ability to appropriately verbalize feelings, Increase emotional regulation, Facilitate acceptance of mental health diagnosis and concerns and Increase skills for wellness and recovery  Therapeutic Interventions: Assess for all discharge needs, 1 to 1 time with Social worker, Explore available  resources and support systems, Assess for adequacy in community support network, Educate family and significant other(s) on suicide prevention, Complete Psychosocial Assessment, Interpersonal group therapy.  Evaluation of Outcomes: Progressing   Progress in Treatment: Attending groups: Yes. Participating in groups: Yes. Taking medication as prescribed: Yes. Toleration medication: Yes. Family/Significant other contact made: No, will contact:  Corey [name], mother Patient understands diagnosis: Yes. Discussing patient identified problems/goals with staff: Yes. Medical problems stabilized or resolved: Yes. Denies suicidal/homicidal ideation: Yes. Issues/concerns per patient self-inventory: No. Other: NA  New problem(s) identified: No, Describe:  None reported  New Short Term/Long Term Goal(s): "To get to a stable mental place"  Patient Goals:  "To get to a stable mental place"  Discharge Plan or Barriers: Pt will return home and follow up with outpatient treatment  Reason for Continuation of Hospitalization: Medication stabilization  Estimated Length of Stay:3-5 days  Recreational Therapy: Patient Stressors: Relationship, Family, Health  Patient Goal: Patient will identify benefit of improved communication within 5 recreation therapy group sessions  Attendees: Patient:Corey [name] 06/30/2018 12:15 PM  Physician: Lilia ProJolenta [name], MD 06/30/2018 12:15 PM  Nursing: Cecille AmsterdamGigi, Manirattui RN 06/30/2018 12:15 PM  RN Care Manager: 06/30/2018 12:15 PM  Social Worker: Lowella Dandyarren Livingston, LCSW 06/30/2018 12:15 PM  Recreational Therapist: Danella DeisShay. Dreama SaaOutlaw CTRS, LRT 06/30/2018 12:15 PM  Other:  06/30/2018 12:15 PM  Other:  06/30/2018 12:15 PM  Other: 06/30/2018 12:15 PM    Scribe for Treatment Team: Suzan SlickARREN T LIVINGSTON, LCSW 06/30/2018 12:15 PM

## 2018-06-30 NOTE — [facility] Suicide Risk Assessment (Signed)
[facility] INPATIENT:  Family/Significant Other Suicide Prevention Education  Suicide Prevention Education:  Contact Attempts: June Parrish, mother, [phone], has been identified by the patient as the family member/significant other with whom the patient will be residing, and identified as the person(s) who will aid the patient in the event of a mental health crisis.  With written consent from the patient, two attempts were made to provide suicide prevention education, prior to and/or following the patient's discharge.  We were unsuccessful in providing suicide prevention education.  A suicide education pamphlet was given to the patient to share with family/significant other.  Date and time of first attempt:06/30/18, 1325 Date and time of second attempt:  Lorri FrederickWierda, [place] Vander[place]iff Jon 06/30/2018, 1:26 PM
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
## 2018-07-01 MED ORDER — RISPERIDONE 1 MG PO TABS
3.0000 mg | ORAL_TABLET | Freq: Every day | ORAL | Status: DC
  Administered 2018-07-01 – 2018-07-03 (×3): 3 mg via ORAL
  Filled 2018-07-01 (×3): qty 3

## 2018-07-01 NOTE — Progress Notes (Addendum)
DAR Note: Pt visible in milieu at intervals during shift. A & O X4. Presents with flat affect and depressed mood. Denies SI, HI, AVH and pain "I'm ok right now". Compliant with medications when offered. Denies side effects when assessed. Held noon dose of and 1700 dose of Clonidine along with his scheduled Midodrine due to low BP readings on 4 separate attempts. Pt presents asymptomatic, denies dizziness, headaches etch. Fluids encouraged and tolerated well. Emotional support offered to pt throughout this shift. All medications given with verbal education and effects monitored. Encouraged pt to voice concerns, attend to ADLs and comply with current treatment regimen. Safety checks continues at Q 15 minutes intervals without self harm gestures or outburst to note thus far. Notified Dr. Joseph ArtSubedi about pt's low BP readings, no new order given at this time.  Pt has been compliant with care and cooperative with unit routines. Tolerates all PO intake well. Denies concerns at this time. Remains safe on unit.

## 2018-07-01 NOTE — Plan of Care (Signed)
Cooperative and pleasant on approach, pt reports that he hope he can go home soon, he was compliant with medication regime and he interacted well with peers and staff. No behavioral issues to report on shift at this time.

## 2018-07-01 NOTE — Progress Notes (Signed)
[facility] MD Progress Note  07/01/2018 1:25 PM Corey [name]   MRN:  [id number]  Subjective:    Corey [name] has a history of bipolar admitted for suicidal and homicidal ideation and vague hallucinations after separating from his wife in the summer. H/O bradycardia with pacemaker.  " tired" Pt lying in his bed, reports hearing voices  Off an don states " no one likes you", and VH of decreased uncle and dad, pt taking meds , He appears depressed with severe psychomotor retardation, denies suicidal ideation, pt  no longer homicidal.  He was started on Risperdal which he tolerates well so far.   Principal Problem: Bipolar 1 disorder (HCC) Diagnosis: Principal Problem:   Bipolar 1 disorder most recent episode depressed with psychotic features Active Problems:   Psychosis (HCC)  Total Time spent with patient: 25 min  Past Psychiatric History: bipolar disorder  Past Medical History:  Past Medical History:  Diagnosis Date  . Bipolar 1 disorder (HCC)   . GERD (gastroesophageal reflux disease)   . HTN (hypertension)   . 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 ESOPHAGOGASTRODUODENOSCOPY (EGD) WITH PROPOFOL N/A 04/26/2018   Procedure: ESOPHAGOGASTRODUODENOSCOPY (EGD) WITH PROPOFOL;  Surgeon: Wyline Mood, MD;  Location: [facility] ENDOSCOPY;  Service: Gastroenterology;  Laterality: N/A;  . KNEE ARTHROSCOPY    . PACEMAKER INSERTION Left 05/15/2018   Procedure: INSERTION PACEMAKER;  Surgeon: Marcina Millard, MD;  Location: [facility] ORS;  Service: Cardiovascular;  Laterality: Left;   Family History:  Family History  Problem Relation Age of Onset  . Kidney cancer Neg Hx   . Bladder Cancer Neg Hx   . Prostate cancer Neg Hx    Family Psychiatric  History: see H&P Social History:  Social History   Substance and Sexual Activity  Alcohol Use No     Social History   Substance and Sexual Activity  Drug Use Not  Currently  . Types: Marijuana    Social History   Socioeconomic History  . Marital status: Legally Separated    Spouse name: Not on file  . Number of children: 3  . Years of education: Not on file  . Highest education level: Not on file  Occupational History  . Not on file  Social Needs  . Financial resource strain: Not hard at all  . Food insecurity:    Worry: Never true    Inability: Never true  . Transportation needs:    Medical: No    Non-medical: No  Tobacco Use  . Smoking status: Former Games developer  . Smokeless tobacco: Current User    Types: Snuff  Substance and Sexual Activity  . Alcohol use: No  . Drug use: Not Currently    Types: Marijuana  . Sexual activity: Not on file  Lifestyle  . Physical activity:    Days per week: 7 days    Minutes per session: 60 min  . Stress: Not at all  Relationships  . Social connections:    Talks on phone: More than three times a week    Gets together: More than three times a week    Attends religious service: Never    Active member of club or organization: No    Attends meetings of clubs or organizations: Never    Relationship status: Separated  Other Topics Concern  . Not on file  Social History Narrative  . Not on file   Additional Social History:  Sleep: Fair  Appetite:  Fair  Current Medications: Current Facility-Administered Medications  Medication Dose Route Frequency Provider Last Rate Last Dose  . acetaminophen (TYLENOL) tablet 650 mg  650 mg Oral Q6H PRN He, Jun, MD      . alum & mag hydroxide-simeth (MAALOX/MYLANTA) 200-200-20 MG/5ML suspension 30 mL  30 mL Oral Q4H PRN He, Jun, MD      . clonazePAM Scarlette Calico) tablet 0.5 mg  0.5 mg Oral BID PRN He, Jun, MD      . cloNIDine (CATAPRES) tablet 0.1 mg  0.1 mg Oral TID He, Jun, MD   Stopped at 07/01/18 1213  . divalproex (DEPAKOTE ER) 24 hr tablet 1,000 mg  1,000 mg Oral QHS He, Jun, MD   1,000 mg at 06/30/18 2110  . FLUoxetine  (PROZAC) capsule 40 mg  40 mg Oral Daily He, Jun, MD   40 mg at 07/01/18 0804  . hydrOXYzine (ATARAX/VISTARIL) tablet 25 mg  25 mg Oral TID PRN He, Jun, MD   25 mg at 06/30/18 2230  . magnesium hydroxide (MILK OF MAGNESIA) suspension 30 mL  30 mL Oral Daily PRN He, Jun, MD      . midodrine (PROAMATINE) tablet 2.5 mg  2.5 mg Oral BID He, Jun, MD   2.5 mg at 07/01/18 0801  . nicotine (NICODERM CQ - dosed in mg/24 hours) patch 21 mg  21 mg Transdermal Once He, Jun, MD      . pantoprazole (PROTONIX) EC tablet 40 mg  40 mg Oral Daily He, Jun, MD   40 mg at 07/01/18 0803  . risperiDONE (RISPERDAL) tablet 2 mg  2 mg Oral QHS [name], [name], MD   2 mg at 06/30/18 2110    Lab Results:  Results for orders placed or performed during the hospital encounter of 06/29/18 (from the past 48 hour(s))  Hepatic function panel     Status: None   Collection Time: 06/30/18  6:58 AM  Result Value Ref Range   Total Protein 7.0 6.5 - 8.1 g/dL   Albumin 3.8 3.5 - 5.0 g/dL   AST 19 15 - 41 U/L   ALT 19 0 - 44 U/L   Alkaline Phosphatase 63 38 - 126 U/L   Total Bilirubin 0.5 0.3 - 1.2 mg/dL   Bilirubin, Direct <1.6 0.0 - 0.2 mg/dL   Indirect Bilirubin NOT CALCULATED 0.3 - 0.9 mg/dL    Comment: Performed at [facility], [address] [place], [place] [address]  TSH     Status: None   Collection Time: 06/30/18  6:58 AM  Result Value Ref Range   TSH 1.055 0.350 - 4.500 uIU/mL    Comment: Performed by a 3rd Generation assay with a functional sensitivity of <=0.01 uIU/mL. Performed at [facility], [address] [place], [place] [address]     Blood Alcohol level:  Lab Results  Component Value Date   [facility] <10 06/28/2018   ETH <10 06/08/2018    Metabolic Disorder Labs: No results found for: HGBA1C, MPG No results found for: PROLACTIN No results found for: CHOL, TRIG, HDL, CHOLHDL, VLDL, LDLCALC  Physical Findings: AIMS:  , ,  ,  ,    CIWA:    COWS:      Musculoskeletal: Strength & Muscle Tone: within normal limits Gait & Station: normal Patient leans: N/A  Psychiatric Specialty Exam: Physical Exam  Nursing note and vitals reviewed. Psychiatric: His speech is normal. His affect is blunt. He is withdrawn. Cognition  and memory are normal. He expresses impulsivity. He exhibits a depressed mood. He expresses suicidal ideation.    Review of Systems  Neurological: Negative.   Psychiatric/Behavioral: Positive for depression and suicidal ideas. The patient has insomnia.   All other systems reviewed and are negative.   Blood pressure 92/66, pulse 81, temperature 97.8 F (36.6 C), temperature source Oral, resp. rate 17, height 5\' 11"  (1.803 m), weight 95.7 kg, SpO2 97 %.Body mass index is 29.43 kg/m.  General Appearance: Casual  Eye Contact:  Good  Speech:  Clear and Coherent  Volume:  Normal  Mood:  Anxious and Depressed  Affect:  blunted  Thought Process:  Goal Directed and Descriptions of Associations: Intact  Orientation:  Full (Time, Place, and Person)  Thought Content:  Hallucinations: Auditory Visual, AVH as above  Suicidal Thoughts:  denies  Homicidal Thoughts:  No  Memory:  Immediate;   Fair Recent;   Fair Remote;   Fair  Judgement:  Poor  Insight:  Lacking  Psychomotor Activity:  Psychomotor Retardation  Concentration:  Concentration: Fair and Attention Span: Fair  Recall:  FiservFair  Fund of Knowledge:  Fair  Language:  Fair  Akathisia:  No  Handed:  Right  AIMS (if indicated):     Assets:  Communication Skills Desire for Improvement Resilience  ADL's:  Intact  Cognition:  WNL  Sleep:  Number of Hours: 7.5     Treatment Plan Summary: Daily contact with patient to assess and evaluate symptoms and progress in treatment and Medication management   Corey [name] is a 32 year old Corey [name] with a history of depression and PTSD admitted for suicidal/homicidal ideation and hallucinations. Pt still psychotic,   # Bipolar with  psychotic features.  -increase Depakote to 1000 mg nightly ( VPA trough was 57 in ED)  -increase Risperdal to 3 mg nightly for hallucinations. His QTc is 511, but pt said that he has a pacemaker.  -continue Prozac 40mg  daily.   #Anxiety -patient recently started on Xanax at home but due to history of alcoholism Dr. He discontinued Xanax -Clonazepam 0.5 mg BID available for withdrawal symptoms -VS are stable  # r/o PTSD -pt has hx of trauma exposure, but no trauma treatment.  -his Sx is somewhat related to the trauma. Please monitor.   # Remote hx of alcohol, cocaine and cannabis use disorder,  -currently in remission.   -need to avoid BZD. He is prescribed with xanax 1mg  BID, recently, and was on Klonopin 1mg  daily for 6 months before that.  Will not restart standing xanax, but will provide prn for withdrawal  # Bradycardia -pt said that he has a pacemaker -monitor if symptomatic   #BP problems, under cardilogy care -Clonidine 0.1 mg TID -Midodrin 2.5 mg BID  #Smoking cessation -nicotine patch is available  # Labs- pertinent labs reviwed -lipid panel, TSH, A1C are normal  #Dispo  -follow up with [facility]   Beverly SessionsJagannath Hser Belanger, MD 07/01/2018, 1:25 PMPatient ID: Corey [name], Corey [name]   DOB: [birth year], 32 y.o.   MRN: [id number]

## 2018-07-01 NOTE — Plan of Care (Signed)
Patient is alert and oriented x 4, denies any SI/HI/AVH verbally  contract for safety at this time , unit guidelines and expected behaviors discussed,sleep is good and restfully, appetite is good, mood is good, affect is congruent with mood , energy level normal, patient is pleasant and cooperative , engaging appropriately with staff and peers , voice no concerns, cognitive function is intact, depression and anxiety is at 4, patient is compliant with medications and unit procedures with directions and encouragements, 15 minutes rounding is maintained no distress noted.   Problem: Education: Goal: Knowledge of [place] [facility] Education information/materials will improve Outcome: Progressing Goal: Emotional status will improve Outcome: Progressing Goal: Mental status will improve Outcome: Progressing Goal: Verbalization of understanding the information provided will improve Outcome: Progressing   Problem: Coping: Goal: Coping ability will improve Outcome: Progressing Goal: Will verbalize feelings Outcome: Progressing   Problem: Education: Goal: Knowledge of General Education information will improve Description Including pain rating scale, medication(s)/side effects and non-pharmacologic comfort measures Outcome: Progressing   Problem: Health Behavior/Discharge Planning: Goal: Ability to manage health-related needs will improve Outcome: Progressing   Problem: Clinical Measurements: Goal: Ability to maintain clinical measurements within normal limits will improve Outcome: Progressing Goal: Will remain free from infection Outcome: Progressing Goal: Diagnostic test results will improve Outcome: Progressing Goal: Respiratory complications will improve Outcome: Progressing Goal: Cardiovascular complication will be avoided Outcome: Progressing   Problem: Activity: Goal: Risk for activity intolerance will decrease Outcome: Progressing   Problem: Nutrition: Goal: Adequate  nutrition will be maintained Outcome: Progressing   Problem: Coping: Goal: Level of anxiety will decrease Outcome: Progressing   Problem: Elimination: Goal: Will not experience complications related to bowel motility Outcome: Progressing Goal: Will not experience complications related to urinary retention Outcome: Progressing   Problem: Pain Managment: Goal: General experience of comfort will improve Outcome: Progressing   Problem: Safety: Goal: Ability to remain free from injury will improve Outcome: Progressing   Problem: Skin Integrity: Goal: Risk for impaired skin integrity will decrease Outcome: Progressing
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
## 2018-07-02 NOTE — Plan of Care (Signed)
Patient is responding well to treatment and well adjusted in the unit , maintaining safety guidelines , participating in groups and active in discussions , patient is engaging appropriately, mood is bright and affect is congruent with mood, appetite is good, sleep is long and restful, patient contract for safety of self and others .  Patient is compliant with his medications , fully oriented , insight into illness is fair, social judgement is intact , thinking is logical and thought content is appropriate , denies any SI/HI/AVH at this time, sleep is restful, patient expressed reduced anxiety and depression at 3/10, encourage patient to develop coping skills and to engage in leisure activities with peers to improve mental and emotional state.  Patient acknowledge informations provided and will make positive statements about self, patient is maintaining safety in the unit 15 minute safety rounding is in progress no distress noted.   Problem: Education: Goal: Knowledge of [place] [facility] Education information/materials will improve Outcome: Progressing Goal: Emotional status will improve Outcome: Progressing Goal: Mental status will improve Outcome: Progressing Goal: Verbalization of understanding the information provided will improve Outcome: Progressing   Problem: Coping: Goal: Coping ability will improve Outcome: Progressing Goal: Will verbalize feelings Outcome: Progressing   Problem: Education: Goal: Knowledge of General Education information will improve Description Including pain rating scale, medication(s)/side effects and non-pharmacologic comfort measures Outcome: Progressing   Problem: Health Behavior/Discharge Planning: Goal: Ability to manage health-related needs will improve Outcome: Progressing   Problem: Clinical Measurements: Goal: Ability to maintain clinical measurements within normal limits will improve Outcome: Progressing Goal: Will remain free from  infection Outcome: Progressing Goal: Diagnostic test results will improve Outcome: Progressing Goal: Respiratory complications will improve Outcome: Progressing Goal: Cardiovascular complication will be avoided Outcome: Progressing   Problem: Activity: Goal: Risk for activity intolerance will decrease Outcome: Progressing   Problem: Nutrition: Goal: Adequate nutrition will be maintained Outcome: Progressing   Problem: Coping: Goal: Level of anxiety will decrease Outcome: Progressing   Problem: Elimination: Goal: Will not experience complications related to bowel motility Outcome: Progressing Goal: Will not experience complications related to urinary retention Outcome: Progressing   Problem: Pain Managment: Goal: General experience of comfort will improve Outcome: Progressing   Problem: Safety: Goal: Ability to remain free from injury will improve Outcome: Progressing   Problem: Skin Integrity: Goal: Risk for impaired skin integrity will decrease Outcome: Progressing

## 2018-07-02 NOTE — [facility] Suicide Risk Assessment (Signed)
[facility] INPATIENT:  Family/Significant Other Suicide Prevention Education  Suicide Prevention Education:  Education Completed; Corey [name], mother, [phone]  has been identified by the patient as the family member/significant other with whom the patient will be residing, and identified as the person(s) who will aid the patient in the event of a mental health crisis (suicidal ideations/suicide attempt).  With written consent from the patient, the family member/significant other has been provided the following suicide prevention education, prior to the and/or following the discharge of the patient.  The suicide prevention education provided includes the following:  Suicide risk factors  Suicide prevention and interventions  National Suicide Hotline telephone number  [facility] assessment telephone number  [facility] Emergency Assistance 911  [facility] and/or Residential Mobile Crisis Unit telephone number  Request made of family/significant other to:  Remove weapons (e.g., guns, rifles, knives), all items previously/currently identified as safety concern.    Remove drugs/medications (over-the-counter, prescriptions, illicit drugs), all items previously/currently identified as a safety concern.  The family member/significant other verbalizes understanding of the suicide prevention education information provided.  The family member/significant other agrees to remove the items of safety concern listed above. Mother states she is concerned about his anger, his depression, his pychosis.  States there are 2 guns in the home.  1 is a BB gun, the other an inoperable [place] era rifle that was his grandfather's. She cites the fact that his ex recently took his 32 YO to [place] with telling him as a Diplomatic Services operational officerstresser; states she did the same thing last year-took him to [place] for several months without letting him know.  Cited the deaths of other children as a stressor-says he and his  wife lost twins.  Corey RogueRodney B Imraan [name] 07/02/2018, 12:29 PM

## 2018-07-02 NOTE — [facility] Group Notes (Signed)
LCSW Group Therapy Note   07/02/2018 1:15pm   Type of Therapy and Topic:  Group Therapy:  Positive Affirmations   Participation Level:  Active  Description of Group: This group addressed positive affirmation toward self and others. Patients went around the room and identified two positive things about themselves and two positive things about a peer in the room. Patients reflected on how it felt to share something positive with others, to identify positive things about themselves, and to hear positive things from others. Patients were encouraged to have a daily reflection of positive characteristics or circumstances.  Therapeutic Goals 1. Patient will verbalize two of their positive qualities 2. Patient will demonstrate empathy for others by stating two positive qualities about a peer in the group 3. Patient will verbalize their feelings when voicing positive self affirmations and when voicing positive affirmations of others 4. Patients will discuss the potential positive impact on their wellness/recovery of focusing on positive traits of self and others. Summary of Patient Progress:  Stayed the entire time, engaged throughout.  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  Admitted to not letting others know about other struggles, like psychosis and anger.  Is motivated for change because of his children.  "I want to be the best parent I can be for them."  Therapeutic Modalities Cognitive Behavioral Therapy Motivational Interviewing  Ida RogueRodney B Maikol Grassia, LCSW 07/02/2018 12:03 PM

## 2018-07-02 NOTE — Plan of Care (Signed)
Patient is alert and oriented X 4, Denies SI, HI but positive for Auditory hallucination. Patient  states he is sleeping, eating and showering well. Patient is able to participate in groups. Patient is logical and coherent in thought and speech. Patient expressed concern about potential discharge date. Patient rates depression 0/10 and anxiety 0/10. Patient is appropriate and pleasant. Patient states he has support within his family and he lives in a private residence with his sister and [place]. Patient states he has a primary care physician but has not made an appointment as of yet. Patient states he has blue cross and blue shield insurance. No self injurious behaviors noted. Safety checks Q 15 minutes. Problem: Education: Goal: Knowledge of [facility] Education information/materials will improve Outcome: Progressing Goal: Emotional status will improve Outcome: Progressing Goal: Mental status will improve Outcome: Progressing Goal: Verbalization of understanding the information provided will improve Outcome: Progressing   Problem: Coping: Goal: Coping ability will improve Outcome: Progressing Goal: Will verbalize feelings Outcome: Progressing   Problem: Education: Goal: Knowledge of General Education information will improve Description Including pain rating scale, medication(s)/side effects and non-pharmacologic comfort measures Outcome: Progressing

## 2018-07-02 NOTE — Progress Notes (Signed)
[facility]  07/02/2018 12:37 PM Glori BickersDevin D Crowson   MRN:  [id number]  Subjective:    Mr. Corey BachBurns has a history of bipolar admitted for suicidal and homicidal ideation and vague hallucinations after separating from his wife in the summer. H/O bradycardia with pacemaker.   " I feel more stable" Pt reports feeling better, reports  hearing voices are less.   pt taking meds , He appears depressed with  psychomotor retardation, denies suicidal ideation, pt  no longer homicidal.  Pt taking meds, denies side effects. Pt reportedly quiet .  Principal Problem: Bipolar 1 disorder (HCC) Diagnosis: Principal Problem:   Bipolar 1 disorder most recent episode depressed with psychotic features Active Problems:   Psychosis (HCC)  Total Time spent with patient: 25 min  Past Psychiatric History: bipolar disorder  Past Medical History:  Past Medical History:  Diagnosis Date  . Bipolar 1 disorder (HCC)   . GERD (gastroesophageal reflux disease)   . HTN (hypertension)   . 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 Darrold JunkerParaschos    Past Surgical History:  Procedure Laterality Date  . ESOPHAGOGASTRODUODENOSCOPY (EGD) WITH PROPOFOL N/A 04/26/2018   Procedure: ESOPHAGOGASTRODUODENOSCOPY (EGD) WITH PROPOFOL;  Surgeon: Wyline MoodAnna, Kiran, MD;  [facility];  Service: Gastroenterology;  Laterality: N/A;  . KNEE ARTHROSCOPY    . PACEMAKER INSERTION Left 05/15/2018   Procedure: INSERTION PACEMAKER;  Surgeon: Marcina MillardParaschos, Alexander, MD;  Location: [facility] ORS;  Service: Cardiovascular;  Laterality: Left;   Family History:  Family History  Problem Relation Age of Onset  . Kidney cancer Neg Hx   . Bladder Cancer Neg Hx   . Prostate cancer Neg Hx    Family Psychiatric  History: see H&P Social History:  Social History   Substance and Sexual Activity  Alcohol Use No     Social History   Substance and Sexual Activity  Drug Use Not Currently  . Types: Marijuana    Social  History   Socioeconomic History  . Marital status: Legally Separated    Spouse name: Not on file  . Number of children: 3  . Years of education: Not on file  . Highest education level: Not on file  Occupational History  . Not on file  Social Needs  . Financial resource strain: Not hard at all  . Food insecurity:    Worry: Never true    Inability: Never true  . Transportation needs:    Medical: No    Non-medical: No  Tobacco Use  . Smoking status: Former Games developermoker  . Smokeless tobacco: Current User    Types: Snuff  Substance and Sexual Activity  . Alcohol use: No  . Drug use: Not Currently    Types: Marijuana  . Sexual activity: Not on file  Lifestyle  . Physical activity:    Days per week: 7 days    Minutes per session: 60 min  . Stress: Not at all  Relationships  . Social connections:    Talks on phone: More than three times a week    Gets together: More than three times a week    Attends religious service: Never    Active member of club or organization: No    Attends meetings of clubs or organizations: Never    Relationship status: Separated  Other Topics Concern  . Not on file  Social History Narrative  . Not on file   Additional Social History:  Sleep: Fair  Appetite:  Fair  Current Medications: Current Facility-Administered Medications  Medication Dose Route Frequency Provider Last Rate Last Dose  . acetaminophen (TYLENOL) tablet 650 mg  650 mg Oral Q6H PRN He, Jun, MD      . alum & mag hydroxide-simeth (MAALOX/MYLANTA) 200-200-20 MG/5ML suspension 30 mL  30 mL Oral Q4H PRN He, Jun, MD      . clonazePAM Scarlette Calico) tablet 0.5 mg  0.5 mg Oral BID PRN He, Jun, MD      . cloNIDine (CATAPRES) tablet 0.1 mg  0.1 mg Oral TID He, Jun, MD   0.1 mg at 07/02/18 1159  . divalproex (DEPAKOTE ER) 24 hr tablet 1,000 mg  1,000 mg Oral QHS He, Jun, MD   1,000 mg at 07/01/18 2104  . FLUoxetine (PROZAC) capsule 40 mg  40 mg Oral Daily He,  Jun, MD   40 mg at 07/02/18 0802  . hydrOXYzine (ATARAX/VISTARIL) tablet 25 mg  25 mg Oral TID PRN He, Jun, MD   25 mg at 06/30/18 2230  . magnesium hydroxide (MILK OF MAGNESIA) suspension 30 mL  30 mL Oral Daily PRN He, Jun, MD      . midodrine (PROAMATINE) tablet 2.5 mg  2.5 mg Oral BID He, Jun, MD   2.5 mg at 07/02/18 0802  . nicotine (NICODERM CQ - dosed in mg/24 hours) patch 21 mg  21 mg Transdermal Once He, Jun, MD   21 mg at 07/01/18 1700  . pantoprazole (PROTONIX) EC tablet 40 mg  40 mg Oral Daily He, Jun, MD   40 mg at 07/02/18 0802  . risperiDONE (RISPERDAL) tablet 3 mg  3 mg Oral QHS Beverly Sessions, MD   3 mg at 07/01/18 2104    Lab Results:  No results found for this or any previous visit (from the past 48 hour(s)).  Blood Alcohol level:  Lab Results  Component Value Date   ETH <10 06/28/2018   ETH <10 06/08/2018    Metabolic Disorder Labs: No results found for: HGBA1C, MPG No results found for: PROLACTIN No results found for: CHOL, TRIG, HDL, CHOLHDL, VLDL, LDLCALC  Physical Findings: AIMS:  , ,  ,  ,    CIWA:    COWS:     Musculoskeletal: Strength & Muscle Tone: within normal limits Gait & Station: normal Patient leans: N/A  Psychiatric Specialty Exam: Physical Exam  Nursing note and vitals reviewed. Psychiatric: His speech is normal. His affect is blunt. He is withdrawn. Cognition and memory are normal. He expresses impulsivity. He exhibits a depressed mood. He expresses suicidal ideation.    Review of Systems  Neurological: Negative.   Psychiatric/Behavioral: Positive for depression and suicidal ideas. The patient has insomnia.   All other systems reviewed and are negative.   Blood pressure 120/83, pulse 89, temperature 97.8 F (36.6 C), temperature source Oral, resp. rate 18, height 5\' 11"  (1.803 m), weight 95.7 kg, SpO2 99 %.Body mass index is 29.43 kg/m.  General Appearance: Casual  Eye Contact:  Good  Speech:  Clear and Coherent  Volume:   Normal  Mood:  " more stable"  Affect:  blunted  Thought Process:  Goal Directed and Descriptions of Associations: Intact  Orientation:  Full (Time, Place, and Person)  Thought Content:  AH improving   Suicidal Thoughts:  denies  Homicidal Thoughts:  No  Memory:  Immediate;   Fair Recent;   Fair Remote;   Fair  Judgement:  Poor  Insight:  Lacking  Psychomotor Activity:  Psychomotor Retardation  Concentration:  Concentration: Fair and Attention Span: Fair  Recall:  [employer] of Knowledge:  Fair  Language:  Fair  Akathisia:  No  Handed:  Right  AIMS (if indicated):     Assets:  Communication Skills Desire for Improvement Resilience  ADL's:  Intact  Cognition:  WNL  Sleep:  Number of Hours: 6.3     Treatment Plan Summary: Daily contact with patient to assess and evaluate symptoms and progress in treatment and Medication management   Mr. Zerkle is a 32 year old male with a history of depression and PTSD admitted for suicidal/homicidal ideation and hallucinations. Psychosis improving   # Bipolar with psychotic features.  - Depakote to 1000 mg nightly ( VPA trough was 57 in ED)  - Risperdal to 3 mg nightly for hallucinations. His QTc is 511, but pt said that he has a pacemaker.  -continue Prozac 40mg  daily.   #Anxiety -patient recently started on Xanax at home but due to history of alcoholism Dr. He discontinued Xanax -Clonazepam 0.5 mg BID available for withdrawal symptoms -VS are stable  # r/o PTSD -pt has hx of trauma exposure, but no trauma treatment.  -his Sx is somewhat related to the trauma. Please monitor.   # Remote hx of alcohol, cocaine and cannabis use disorder,  -currently in remission.   -need to avoid BZD. He is prescribed with xanax 1mg  BID, recently, and was on Klonopin 1mg  daily for 6 months before that.  Will not restart standing xanax, but will provide prn for withdrawal  # Bradycardia -pt said that he has a pacemaker -monitor if  symptomatic   #BP problems, under cardilogy care -Clonidine 0.1 mg TID -Midodrin 2.5 mg BID  #Smoking cessation -nicotine patch is available  # Labs- pertinent labs reviwed   #Dispo  -follow up with [facility]   Beverly Sessions, MD 07/02/2018, 12:37 PMPatient ID: Glori Bickers, male   DOB: 03-Aug-1985, 32 y.o.   MRN: [id number] Patient ID: DASHON MCINTIRE, male   DOB: 20-Jul-1986, 32 y.o.   MRN: [id number]

## 2018-07-03 MED ORDER — CLONIDINE HCL 0.1 MG PO TABS: 0.1000 mg | ORAL_TABLET | Freq: Two times a day (BID) | ORAL | Status: DC | PRN

## 2018-07-03 NOTE — [facility] Group Notes (Signed)
Overcoming Obstacles  07/03/2018 1PM  Type of Therapy and Topic:  Group Therapy:  Overcoming Obstacles  Participation Level:  Did Not Attend    Description of Group:    In this group patients will be encouraged to explore what they see as obstacles to their own wellness and recovery. They will be guided to discuss their thoughts, feelings, and behaviors related to these obstacles. The group will process together ways to cope with barriers, with attention given to specific choices patients can make. Each patient will be challenged to identify changes they are motivated to make in order to overcome their obstacles. This group will be process-oriented, with patients participating in exploration of their own experiences as well as giving and receiving support and challenge from other group members.   Therapeutic Goals: 1. Patient will identify personal and current obstacles as they relate to admission. 2. Patient will identify barriers that currently interfere with their wellness or overcoming obstacles.  3. Patient will identify feelings, thought process and behaviors related to these barriers. 4. Patient will identify two changes they are willing to make to overcome these obstacles:      Summary of Patient Progress     Therapeutic Modalities:   Cognitive Behavioral Therapy Solution Focused Therapy Motivational Interviewing Relapse Prevention Therapy    Lawernce Earll, MSW, LCSW 07/03/2018 1:57 PM  

## 2018-07-03 NOTE — Progress Notes (Addendum)
[facility] MD Progress Note  07/03/2018 12:58 PM Corey [name]  MRN:  [id number] Subjective:  Pt states that he is feeling much better. He states that his mood is much calmer and much less anxious. He states that the groups have really been helpful for him. He states, " I feel much more stable and I finally got out of my slump." He states that AH have improved significantly and he has not had any voices in several days. He also denies VH. He states that he no longer feels homicidal or suicidal. HE reports his sleep is fair. He likes the medications he is on. We reviewed his medication list together. HE is not sure why he is on Clonidine. In looking through the chart, it does not appear that this was something that cardiology prescribed. HE was also not to continue midodrine. His BP has been on the lower side and these medications have been held by [employer]. Will d/c both of them. He is calm and organized in thoughts.   Principal Problem: Bipolar 1 disorder (HCC) Diagnosis: Principal Problem:   Bipolar 1 disorder most recent episode depressed with psychotic features Active Problems:   Psychosis (HCC)  Total Time spent with patient: 30 minutes  Past Psychiatric History: See H&P  Past Medical History:  Past Medical History:  Diagnosis Date  . Bipolar 1 disorder (HCC)   . GERD (gastroesophageal reflux disease)   . HTN (hypertension)   . 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 ESOPHAGOGASTRODUODENOSCOPY (EGD) WITH PROPOFOL N/A 04/26/2018   Procedure: ESOPHAGOGASTRODUODENOSCOPY (EGD) WITH PROPOFOL;  Surgeon: Wyline Mood, MD;  Location[facility] ENDOSCOPY;  Service: Gastroenterology;  Laterality: N/A;  . KNEE ARTHROSCOPY    . PACEMAKER INSERTION Left 05/15/2018   Procedure: INSERTION PACEMAKER;  Surgeon: Marcina Millard, MD;  Location: [facility] ORS;  Service: Cardiovascular;  Laterality: Left;   Family History:   Family History  Problem Relation Age of Onset  . Kidney cancer Neg Hx   . Bladder Cancer Neg Hx   . Prostate cancer Neg Hx    Family Psychiatric  History: See H&P Social History:  Social History   Substance and Sexual Activity  Alcohol Use No     Social History   Substance and Sexual Activity  Drug Use Not Currently  . Types: Marijuana    Social History   Socioeconomic History  . Marital status: Legally Separated    Spouse name: Not on file  . Number of children: 3  . Years of education: Not on file  . Highest education level: Not on file  Occupational History  . Not on file  Social Needs  . Financial resource strain: Not hard at all  . Food insecurity:    Worry: Never true    Inability: Never true  . Transportation needs:    Medical: No    Non-medical: No  Tobacco Use  . Smoking status: Former Games developer  . Smokeless tobacco: Current User    Types: Snuff  Substance and Sexual Activity  . Alcohol use: No  . Drug use: Not Currently    Types: Marijuana  . Sexual activity: Not on file  Lifestyle  . Physical activity:    Days per week: 7 days    Minutes per session: 60 min  . Stress: Not at all  Relationships  . Social connections:    Talks on phone: More than three times a week  Gets together: More than three times a week    Attends religious service: Never    Active member of club or organization: No    Attends meetings of clubs or organizations: Never    Relationship status: Separated  Other Topics Concern  . Not on file  Social History Narrative  . Not on file   Additional Social History:                         Sleep: Fair  Appetite:  Fair  Current Medications: Current Facility-Administered Medications  Medication Dose Route Frequency Provider Last Rate Last Dose  . acetaminophen (TYLENOL) tablet 650 mg  650 mg Oral Q6H PRN He, Jun, MD      . alum & mag hydroxide-simeth (MAALOX/MYLANTA) 200-200-20 MG/5ML suspension 30 mL  30 mL  Oral Q4H PRN He, Jun, MD      . clonazePAM Scarlette Calico) tablet 0.5 mg  0.5 mg Oral BID PRN He, Jun, MD      . divalproex (DEPAKOTE ER) 24 hr tablet 1,000 mg  1,000 mg Oral QHS He, Jun, MD   1,000 mg at 07/02/18 2122  . FLUoxetine (PROZAC) capsule 40 mg  40 mg Oral Daily He, Jun, MD   40 mg at 07/03/18 1610  . hydrOXYzine (ATARAX/VISTARIL) tablet 25 mg  25 mg Oral TID PRN He, Jun, MD   25 mg at 07/02/18 2300  . magnesium hydroxide (MILK OF MAGNESIA) suspension 30 mL  30 mL Oral Daily PRN He, Jun, MD      . pantoprazole (PROTONIX) EC tablet 40 mg  40 mg Oral Daily He, Jun, MD   40 mg at 07/03/18 0752  . risperiDONE (RISPERDAL) tablet 3 mg  3 mg Oral QHS Beverly Sessions, MD   3 mg at 07/02/18 2122    Lab Results: No results found for this or any previous visit (from the past 48 hour(s)).  Blood Alcohol level:  Lab Results  Component Value Date   ETH <10 06/28/2018   ETH <10 06/08/2018    Metabolic Disorder Labs: No results found for: HGBA1C, MPG No results found for: PROLACTIN No results found for: CHOL, TRIG, HDL, CHOLHDL, VLDL, LDLCALC  Physical Findings: AIMS:  , ,  ,  ,    CIWA:    COWS:     Musculoskeletal: Strength & Muscle Tone: within normal limits Gait & Station: normal Patient leans: N/A  Psychiatric Specialty Exam: Physical Exam  Nursing note and vitals reviewed.   Review of Systems  All other systems reviewed and are negative.   Blood pressure (!) 127/104, pulse 86, temperature 97.7 F (36.5 C), temperature source Oral, resp. rate 18, height 5\' 11"  (1.803 m), weight 95.7 kg, SpO2 97 %.Body mass index is 29.43 kg/m.  General Appearance: Casual  Eye Contact:  Fair  Speech:  Clear and Coherent  Volume:  Normal  Mood:  Euthymic  Affect:  Congruent  Thought Process:  Coherent and Goal Directed  Orientation:  Full (Time, Place, and Person)  Thought Content:  Logical  Suicidal Thoughts:  No  Homicidal Thoughts:  No  Memory:  Immediate;   Fair  Judgement:   Fair  Insight:  Fair  Psychomotor Activity:  Normal  Concentration:  Concentration: Fair  Recall:  [employer] of Knowledge:  Fair  Language:  Fair  Akathisia:  No      Assets:  Resilience  ADL's:  Intact  Cognition:  WNL  Sleep:  Number of Hours: 6.3     Treatment Plan Summary: 32 yo male admitted due to AH, VH, SI, and HI. HE reports significant improvement in mood and hallucinations. HE is tolerating medications well. HE is attending and participating in groups. He was started on Clonidine by weekend provider. However, unclear why he is on this. Pt is not sure why. His BP has been low at times. Reviewed chart and does not appear cardiology started this medication. He last saw cardio on 06/16/18 with plan for follow up in 3 months. He is also n ot to be on midodrine and this was stopped at recent medical admission.   Plan:  Bipolar disorder -Continue Depakote ER 1000 mg qhs. Will check level tomorrow morning -Continue Risperdal 3 mg qhs -Continue Prozac 40 mg daily  H/o bradycardia s/p pacemaker in 05/2018 -Stop Clonidine and midodrine which was restarted by weekend provider. He is not on these at home and he has had hypotension through hospitalization -will have clonidine prn if hypertensive  Disp -He will need follow up with [facility] [place]. He lives alone and will return home when stable. Possible discharge home tomorrow  Haskell RilingHolly R Dekayla Prestridge, MD 07/03/2018, 12:58 PM

## 2018-07-03 NOTE — Plan of Care (Signed)
Patient continues to be isolated and stayed in bed most of the shift.Patient verbalized that his depression and anxiety is much better.Denies SI,HI and AVH.Did not attend groups states "I don't feel like."Compliant with medications.Appetite and energy level good.Support and encouragement given.

## 2018-07-03 NOTE — Progress Notes (Signed)
Recreation Therapy Notes  Date: 07/03/2018  Time: 9:30 am  Location: Craft Room  Behavioral response: Appropriate   Intervention Topic: Values  Discussion/Intervention:  Group content today was focused on values. The group identified what values are and where they come from. Individuals expressed some values and how many they have. Patients described how they go about add or removing values. The group described the importance of having values and how they go about using them in daily life. Patient participated in the intervention "My Values" where they were able to pick out values that were important to them and make a visual aide.  Clinical Observations/Feedback:  Patient came to group and stated a value that is important to him is work [employer]. He was pulled from group by peer support and later returned. Individual was social with peers and staff while participating in the intervention. Corey [name] LRT/CTRS           Corey [name] 07/03/2018 11:08 AM

## 2018-07-03 NOTE — Plan of Care (Signed)
Patient stated that he had a good day, saying this is the best he has felt since he got here. Patient states that he is ready to Dc.   Problem: Education: Goal: Emotional status will improve Outcome: Progressing Goal: Mental status will improve Outcome: Progressing

## 2018-07-03 NOTE — Progress Notes (Signed)
D - Patient was in the day room upon arrival to the unit. He was pleasant during assessment and medication administration. Patient was asking about being discharged. Patient stated, "The doctor said she things I am going to be going home tomorrow, I am ready." Patient denies SI/HI/AVH, pain, anxiety and depression. Patient was in the day room interacting appropriately with staff and peers.   A - Patient was complaint with medication administration per MD orders. Patient was given education. Patient was given support and encouragement.   R - Patient being monitored Q 15 minutes for safety per unit protocol. Patient informed to let staff know if there are any issues or problems. Patient remains safe on the unit.

## 2018-07-04 LAB — VALPROIC ACID LEVEL: Valproic Acid Lvl: 36 ug/mL — ABNORMAL LOW (ref 50.0–100.0)

## 2018-07-04 MED ORDER — FLUOXETINE HCL 40 MG PO CAPS: 40.0000 mg | ORAL_CAPSULE | Freq: Every day | ORAL | 0 refills | Status: DC

## 2018-07-04 MED ORDER — DIVALPROEX SODIUM ER 500 MG PO TB24: 1000.0000 mg | ORAL_TABLET | Freq: Every day | ORAL | 0 refills | Status: DC

## 2018-07-04 MED ORDER — RISPERIDONE 3 MG PO TABS: 3.0000 mg | ORAL_TABLET | Freq: Every day | ORAL | 0 refills | Status: DC

## 2018-07-04 MED ORDER — PANTOPRAZOLE SODIUM 40 MG PO TBEC: 40.0000 mg | DELAYED_RELEASE_TABLET | Freq: Every day | ORAL | 1 refills | Status: DC

## 2018-07-04 NOTE — [facility] Suicide Risk Assessment (Signed)
[facility]   Principal Problem: Bipolar 1 disorder [facility](HCC) Discharge Diagnoses: Principal Problem:   Bipolar 1 disorder most recent episode depressed with psychotic features Active Problems:   Psychosis (HCC)   Total Time spent with patient, preparing discharge, reviewing chart and discussing patient with his treatment team: 1 hour  Musculoskeletal: Strength & Muscle Tone: within normal limits Gait & Station: normal Patient leans: N/A  Psychiatric Specialty Exam: Review of Systems  Constitutional: Negative for chills and fever.  HENT: Negative for congestion, ear discharge, sinus pain and sore throat.   Eyes: Negative for blurred vision and pain.  Respiratory: Negative for cough and shortness of breath.   Cardiovascular: Negative for chest pain and leg swelling.  Gastrointestinal: Negative for abdominal pain, constipation, diarrhea, heartburn, nausea and vomiting.  Genitourinary: Negative for dysuria.  Musculoskeletal: Negative for joint pain and myalgias.  Skin: Negative for rash.  Neurological: Negative for dizziness, tingling, tremors, seizures, loss of consciousness, weakness and headaches.  Psychiatric/Behavioral: Negative for depression, hallucinations, substance abuse and suicidal ideas.    Blood pressure (!) 122/106, pulse 77, temperature 97.7 F (36.5 C), temperature source Oral, resp. rate 16, height 5\' 11"  (1.803 m), weight 95.7 kg, SpO2 97 %.Body mass index is 29.43 kg/m.  General Appearance: Well Groomed  Patent attorneyye Contact::  Good  Speech:  Clear and Coherent409  Volume:  Normal  Mood:  "chipper"  Affect:  Full Range  Thought Process:  Coherent and Linear  Orientation:  Full (Time, Place, and Person)  Thought Content:  Logical  Suicidal Thoughts:  patient denies suicidal ideation  Homicidal Thoughts:  patient denies homicidal ideation  Memory:  intact  Judgement:  Fair  Insight:  Fair  Psychomotor Activity:  Normal  Concentration:  Good   Recall:  intact  Fund of Knowledge:Fair  Language: Good  Akathisia:  No  Handed:  Left  AIMS (if indicated):   0  Assets:  Communication Skills Desire for Improvement Housing Resilience Social Support  Sleep:  Number of Hours: 6.45  Cognition: WNL  ADL's:  Intact   Mental Status Per Nursing Assessment::   On Admission:  Thoughts of violence towards others, Self-harm thoughts  Demographic Factors:  Male, Caucasian, Living alone, Unemployed and recent separation from spouse  Loss Factors: Decrease in vocational status, Loss of significant relationship, Decline in physical health and Financial problems/change in socioeconomic status  Historical Factors: death of children, past history of substance abuse, depression, trauma, anxiety, psychosis  Risk Reduction Factors:   Responsible for children under 518 years of age, Sense of responsibility to family, Positive social support and Positive therapeutic relationship  Continued Clinical Symptoms:  Previous Psychiatric Diagnoses and Treatments Medical Diagnoses and Treatments/Surgeries  Cognitive Features That Contribute To Risk:  None    Suicide Risk:  Risk factors: witnessed friend hang himself, history of trauma, death of children in 2011, separation from spouse, has not seen 48 month old son; ex-girlfriend also took child and recently moved away, lives alone, unemployed, medical issues, history of depression, recent suicidal ideation, history of bipolar disorder, history of psychosis Protective factors: willingness to seek help, sense of responsibility to children, pt states he and wife are considering working things out and moving back in together, pt also sees a Paramedictherapist and psychiatrist at the [facility] in [place], pt reports his mother, cousin and grandfather are supportive; pt denies access to or ownership of guns (of note, mother reported to Child psychotherapistsocial worker that pt has 1 BB gun and an  inoperable [place] era rifle that  was his grandfather's).  Pt denies a family history of suicide or suicide attempts.   Pt denies a personal history of suicide attempt(s).  Pt denies active thoughts of suicidal ideation.  He denies active thoughts of homicidal ideation.  He states that he will activate his crisis plan if his mood worsens, if he becomes suicidal or if he becomes homicidal.   Crisis plan: call 911, return to the emergency department, return to the hospital, talk to family/friends, call crisis hotlines.   Acute suicide risk: low Chronic suicide risk: moderate   Plan Of Care/Follow-up recommendations:  Activity:  as tolerated; as directed by patient's cardiologist Diet:  heart healthy  Hessie Knows, MD 07/04/2018, 9:14 AM
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
## 2018-07-04 NOTE — Progress Notes (Signed)
  [facility] :  Will you be returning to the same living situation after discharge:  Yes,  own home At discharge, do you have transportation home?: Yes,  mother or grandfather Do you have the ability to pay for your medications: Yes,  BCBS  Release of information consent forms completed and in the chart;  Patient's signature needed at discharge.  Patient to Follow up at: Follow-up Information    [facility]. Go on 07/11/2018.   Specialty:  Behavioral Health Why:  Please attend your therapy appt with Wallie CharJessica McCulley on Tuesday, 07/11/18, at 2pm.  Please attend your medication appt with Dr. Sedalia Mutaox on Monday, 07/17/18, at 1pm. Contact information: [address] [place] [address] [phone]           Next level of care provider [facility] Link:no  Safety Planning and Suicide Prevention discussed: Yes,  with mother     Has patient been referred to the Quitline?: N/A patient is not a smoker  Patient has been referred for addiction treatment: Yes  Lorri FrederickWierda, Corey Malveaux [name], Corey [name] 07/04/2018, 10:22 AM

## 2018-07-04 NOTE — Progress Notes (Signed)
Recreation Therapy Notes  Date: 07/04/2018  Time: 9:30 am  Location: Craft Room  Behavioral response: Appropriate   Intervention Topic: Coping Skills  Discussion/Intervention:  Group content on today was focused on coping skills. The group defined what coping skills are and when they can be used. Individuals described how they normally cope with thing and the coping skills they normally use. Patients expressed why it is important to cope with things and how not coping with things can affect you. The group participated in the intervention "My coping box" and made coping boxes while adding coping skills they could use in the future to the box.  Clinical Observations/Feedback:  Patient came to group and defined coping as how you deal with problems. He identified walking the dog as how he copes with things. Participant stated he does not ask for help when he has an issue because that is just how he was raised. Individual was social with peers and staff while participating in the intervention. Hollan Philipp LRT/CTRS         Drayk Humbarger 07/04/2018 11:02 AM

## 2018-07-04 NOTE — Progress Notes (Signed)
Patient denies SI/HI, denies A/V hallucinations. Patient verbalizes understanding of discharge instructions, follow up care and prescriptions. Patient given all belongings from [facility] locker. Patient escorted out by staff, transported by family. 

## 2018-07-15 ENCOUNTER — Encounter: Payer: Self-pay | Admitting: Emergency Medicine

## 2018-07-15 ENCOUNTER — Other Ambulatory Visit: Payer: Self-pay

## 2018-07-15 ENCOUNTER — Emergency Department
Admission: EM | Admit: 2018-07-15 | Discharge: 2018-07-15 | Disposition: A | Discharge status: Home / Self Care | Payer: BLUE CROSS/BLUE SHIELD | Attending: Emergency Medicine | Admitting: Emergency Medicine

## 2018-07-15 DIAGNOSIS — I10 Essential (primary) hypertension: Secondary | ICD-10-CM | POA: Diagnosis not present

## 2018-07-15 DIAGNOSIS — R440 Auditory hallucinations: Secondary | ICD-10-CM | POA: Diagnosis not present

## 2018-07-15 DIAGNOSIS — Z950 Presence of cardiac pacemaker: Secondary | ICD-10-CM | POA: Insufficient documentation

## 2018-07-15 DIAGNOSIS — R441 Visual hallucinations: Secondary | ICD-10-CM | POA: Diagnosis present

## 2018-07-15 DIAGNOSIS — Z79899 Other long term (current) drug therapy: Secondary | ICD-10-CM | POA: Diagnosis not present

## 2018-07-15 DIAGNOSIS — F17220 Nicotine dependence, chewing tobacco, uncomplicated: Secondary | ICD-10-CM | POA: Diagnosis not present

## 2018-07-15 LAB — COMPREHENSIVE METABOLIC PANEL
ALBUMIN: 3.6 g/dL (ref 3.5–5.0)
ALT: 36 U/L (ref 0–44)
AST: 30 U/L (ref 15–41)
Alkaline Phosphatase: 82 U/L (ref 38–126)
Anion gap: 9 (ref 5–15)
BUN: 17 mg/dL (ref 6–20)
CO2: 22 mmol/L (ref 22–32)
Calcium: 8.5 mg/dL — ABNORMAL LOW (ref 8.9–10.3)
Chloride: 107 mmol/L (ref 98–111)
Creatinine, Ser: 0.96 mg/dL (ref 0.61–1.24)
GFR calc Af Amer: >60 mL/min (ref >60)
GFR calc non Af Amer: >60 mL/min (ref >60)
GLUCOSE: 131 mg/dL — AB (ref 70–99)
Potassium: 3.4 mmol/L — ABNORMAL LOW (ref 3.5–5.1)
Sodium: 138 mmol/L (ref 135–145)
Total Bilirubin: 0.2 mg/dL — ABNORMAL LOW (ref 0.3–1.2)
Total Protein: 6.6 g/dL (ref 6.5–8.1)

## 2018-07-15 LAB — CBC
HCT: 42.1 % (ref 39.0–52.0)
Hemoglobin: 14.2 g/dL (ref 13.0–17.0)
MCH: 30 pg (ref 26.0–34.0)
MCHC: 33.7 g/dL (ref 30.0–36.0)
MCV: 89 fL (ref 80.0–100.0)
Platelets: 259 10*3/uL (ref 150–400)
RBC: 4.73 MIL/uL (ref 4.22–5.81)
RDW: 13.4 % (ref 11.5–15.5)
WBC: 7.8 10*3/uL (ref 4.0–10.5)
nRBC: 0 % (ref 0.0–0.2)

## 2018-07-15 LAB — ACETAMINOPHEN LEVEL: Acetaminophen (Tylenol), Serum: <10 ug/mL — ABNORMAL LOW (ref 10–30)

## 2018-07-15 LAB — URINE DRUG SCREEN, QUALITATIVE (ARMC ONLY)
Amphetamines, Ur Screen: NOT DETECTED
BARBITURATES, UR SCREEN: NOT DETECTED
Benzodiazepine, Ur Scrn: POSITIVE — AB
Cannabinoid 50 Ng, Ur ~~LOC~~: POSITIVE — AB
Cocaine Metabolite,Ur ~~LOC~~: NOT DETECTED
MDMA (Ecstasy)Ur Screen: NOT DETECTED
Methadone Scn, Ur: NOT DETECTED
Opiate, Ur Screen: NOT DETECTED
Phencyclidine (PCP) Ur S: NOT DETECTED
Tricyclic, Ur Screen: NOT DETECTED

## 2018-07-15 LAB — ETHANOL: Alcohol, Ethyl (B): <10 mg/dL (ref <10)

## 2018-07-15 LAB — VALPROIC ACID LEVEL: Valproic Acid Lvl: 74 ug/mL (ref 50.0–100.0)

## 2018-07-15 LAB — SALICYLATE LEVEL: Salicylate Lvl: <7 mg/dL (ref 2.8–30.0)

## 2018-07-15 NOTE — Discharge Instructions (Addendum)
After discussion with the psychiatrist, she has recommended that you follow-up with your outpatient psychiatrist and therapist.  Return to the emergency department immediately for any worsening condition including thoughts of wanting to hurt yourself or others, confusion or altered mental status, or any other symptoms concerning to you.

## 2018-07-15 NOTE — ED Notes (Signed)
Pt to ED by self, lives alone with dog, reports hearing things (someone walking around upstairs and people walking through yard, when investigating pt can find no one.  Pt reports hx of bipolar and depression, and schizoaffective disorder.  Pt on multiple meds for such and reports taking meds as prescribed  Pt is calm and cooperative, reports wanting to talk to someone but doesn't want to be admitted (reports recent admission "made me crazy" [pt laughs])  Pt denies SI/HI and visual hallucinations

## 2018-07-15 NOTE — ED Notes (Signed)
Hourly rounding reveals patient sleeping in room. No complaints, stable, in no acute distress. Q15 minute rounds and monitoring via Security to continue. 

## 2018-07-15 NOTE — ED Triage Notes (Signed)
Pt arrives ambulatory to triage with c/o auditory and visual hallucinations. Pt denies SI and HI at this time. Pt is in NAD.

## 2018-07-15 NOTE — ED Provider Notes (Signed)
[facility] Emergency Department Provider Note   ____________________________________________   First MD Initiated Contact with Patient 07/15/18 0141     (approximate)  I have reviewed the triage vital signs and the nursing notes.   HISTORY  Chief Complaint Hallucinations    HPI Corey [name] is a 32 y.o. male patient has a history of schizoaffective disorder.  He has had both auditory and visual hallucinations in the past.  He had heard voices and seeing people and other things.  He is now taking his medicines as directed he still hearing voices.  He thinks he hears footsteps upstairs which is an empty part of the house.  He goes up to check and there is nothing there.  He is hears noises like wrestling outside he goes out there and there is nothing there either.  It is making him anxious.  He is not hearing voices or seeing anything now.   Past Medical History:  Diagnosis Date  . Bipolar 1 disorder (HCC)   . GERD (gastroesophageal reflux disease)   . HTN (hypertension)   . 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 Psychosis (HCC) 06/29/2018  . Bipolar 1 disorder most recent episode depressed with psychotic features 05/04/2018  . Syncope and collapse 04/29/2018  . Hypotension 04/29/2018  . Bradycardia 04/24/2018  . 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 Kidney stone 03/26/2018    Past Surgical History:  Procedure Laterality Date  . ESOPHAGOGASTRODUODENOSCOPY (EGD) WITH PROPOFOL N/A 04/26/2018   Procedure: ESOPHAGOGASTRODUODENOSCOPY (EGD) WITH PROPOFOL;  Surgeon: Wyline Mood, MD;  Location: [facility] ENDOSCOPY;  Service: Gastroenterology;  Laterality: N/A;  . KNEE ARTHROSCOPY    . PACEMAKER INSERTION Left 05/15/2018   Procedure: INSERTION PACEMAKER;  Surgeon: Marcina Millard, MD;  Location: [facility] ORS;  Service: Cardiovascular;  Laterality: Left;     Prior to Admission medications   Medication Sig Start Date End Date Taking? Authorizing Provider  divalproex (DEPAKOTE ER) 500 MG 24 hr tablet Take 2 tablets (1,000 mg total) by mouth at bedtime. 07/04/18  Yes Hessie Knows, MD  FLUoxetine (PROZAC) 40 MG capsule Take 1 capsule (40 mg total) by mouth daily. 07/04/18  Yes O'Neal, Fransico Setters, MD  pantoprazole (PROTONIX) 40 MG tablet Take 1 tablet (40 mg total) by mouth daily. 07/04/18 09/02/18 Yes O'Neal, Fransico Setters, MD  risperiDONE (RISPERDAL) 3 MG tablet Take 1 tablet (3 mg total) by mouth at bedtime. 07/04/18  Yes O'Neal, Fransico Setters, MD  sucralfate (CARAFATE) 1 g tablet Take 1 tablet (1 g total) by mouth 4 (four) times daily. 04/01/18  Yes Sharman Cheek, MD  nitroGLYCERIN (NITROSTAT) 0.4 MG SL tablet Place 1 tablet (0.4 mg total) under the tongue every 5 (five) minutes as needed for chest pain. 05/28/18 05/28/19  Nita Sickle, MD    Allergies Penicillins and Sulfa antibiotics  Family History  Problem Relation Age of Onset  . Kidney cancer Neg Hx   . Bladder Cancer Neg Hx   . Prostate cancer Neg Hx     Social History Social History   Tobacco Use  . Smoking status: Former Games developer  . Smokeless tobacco: Current User    Types: Snuff  Substance Use Topics  . Alcohol use: No  . Drug use: Not Currently    Types: Marijuana    Review of Systems  Constitutional: No fever/chills Eyes: No visual changes. ENT: No sore throat. Cardiovascular: Denies chest pain. Respiratory: Denies shortness  of breath. Gastrointestinal: No abdominal pain.  No nausea, no vomiting.  No diarrhea.  No constipation. Genitourinary: Negative for dysuria. Musculoskeletal: Negative for back pain. Skin: Negative for rash. Neurological: Negative for headaches, focal weakness  ____________________________________________   PHYSICAL EXAM:  VITAL SIGNS: ED Triage Vitals  Enc Vitals Group     BP 07/15/18 0113 133/90     Pulse Rate 07/15/18 0113 87     Resp  07/15/18 0113 18     Temp 07/15/18 0113 99.4 F (37.4 C)     Temp Source 07/15/18 0113 Oral     SpO2 07/15/18 0113 97 %     Weight 07/15/18 0109 275 lb (124.7 kg)     Height 07/15/18 0109 5\' 11"  (1.803 m)     Head Circumference --      Peak Flow --      Pain Score 07/15/18 0109 0     Pain Loc --      Pain Edu? --      Excl. in GC? --     Constitutional: Alert and oriented. Well appearing and in no acute distress. Eyes: Conjunctivae are normal.  Head: Atraumatic. Nose: No congestion/rhinnorhea. Mouth/Throat: Mucous membranes are moist.  Oropharynx non-erythematous. Neck: No stridor. Cardiovascular: Normal rate, regular rhythm. Grossly normal heart sounds.  Good peripheral circulation. Respiratory: Normal respiratory effort.  No retractions. Lungs CTAB. Gastrointestinal: Soft and nontender. No distention. No abdominal bruits. No CVA tenderness. Musculoskeletal: No lower extremity tenderness nor edema.   Neurologic:  Normal speech and language. No gross focal neurologic deficits are appreciated.  Skin:  Skin is warm, dry and intact. No rash noted. Psychiatric: Mood and affect are normal. Speech and behavior are normal.  ____________________________________________   LABS (all labs ordered are listed, but only abnormal results are displayed)  Labs Reviewed  COMPREHENSIVE METABOLIC PANEL - Abnormal; Notable for the following components:      Result Value   Potassium 3.4 (*)    Glucose, Bld 131 (*)    Calcium 8.5 (*)    Total Bilirubin 0.2 (*)    All other components within normal limits  URINE DRUG SCREEN, QUALITATIVE ([facility] ONLY) - Abnormal; Notable for the following components:   Cannabinoid 50 Ng, Ur [place] POSITIVE (*)    Benzodiazepine, Ur Scrn POSITIVE (*)    All other components within normal limits  ACETAMINOPHEN LEVEL - Abnormal; Notable for the following components:   Acetaminophen (Tylenol), Serum <10 (*)    All other components within normal limits  ETHANOL  CBC   SALICYLATE LEVEL  VALPROIC ACID LEVEL   ____________________________________________  EKG   ____________________________________________  RADIOLOGY  ED MD interpretation:    Official radiology report(s): No results found.  ____________________________________________   PROCEDURES  Procedure(s) performed:   Procedures  Critical Care performed:   ____________________________________________   INITIAL IMPRESSION / ASSESSMENT AND PLAN / ED COURSE  Patient is hearing people walk in the house and hearing noises outside.  As I discussed with him I am not sure what this actually is is he is not hearing voices he is not seeing things anymore.  We will wait for tele-psychiatry to see the patient and decide what to do with him.         ____________________________________________   FINAL CLINICAL IMPRESSION(S) / ED DIAGNOSES  Final diagnoses:  Auditory hallucinations     ED Discharge Orders    None       Note:  This document was prepared using Dragon voice recognition  software and may include unintentional dictation errors.    Arnaldo NatalMalinda, Paul F, MD 07/15/18 [id number]

## 2018-07-15 NOTE — ED Notes (Signed)
Call to lab for add on

## 2018-07-15 NOTE — ED Provider Notes (Signed)
[facility]  I accepted care from Dr. Juliette AlcideMelinda ____________________________________________    LABS (pertinent positives/negatives)  I, Corey Rooksebecca Jahni Paul, MD have personally reviewed the lab reports noted below.  Labs Reviewed  COMPREHENSIVE METABOLIC PANEL - Abnormal; Notable for the following components:      Result Value   Potassium 3.4 (*)    Glucose, Bld 131 (*)    Calcium 8.5 (*)    Total Bilirubin 0.2 (*)    All other components within normal limits  URINE DRUG SCREEN, QUALITATIVE ([facility] ONLY) - Abnormal; Notable for the following components:   Cannabinoid 50 Ng, Ur [place] POSITIVE (*)    Benzodiazepine, Ur Scrn POSITIVE (*)    All other components within normal limits  ACETAMINOPHEN LEVEL - Abnormal; Notable for the following components:   Acetaminophen (Tylenol), Serum <10 (*)    All other components within normal limits  ETHANOL  CBC  SALICYLATE LEVEL  VALPROIC ACID LEVEL       ____________________________________________   PROCEDURES  Procedure(s) performed: None  Procedures  Critical Care performed: None  ____________________________________________   INITIAL IMPRESSION / ASSESSMENT AND PLAN / ED COURSE   Pertinent labs & imaging results that were available during my care of the patient were reviewed by me and considered in my medical decision making (see chart for details).   Patient with some ongoing auditory hallucinations, but overall well-appearing, not meeting criteria for involuntary commitment.  I was awaiting specialist to call psychiatrist evaluation.  Review of his urine drug screen is shows polysubstance abuse.  I have discussed with him to avoid marijuana.  To follow-up with his outpatient psychiatrist and therapist.    CONSULTATIONS: Specialist on-call psychiatrist, I spoke with her by phone and she recommends discharge home without medication change at this point time and follow-up with his outpatient psychiatrist and  therapist.    Patient / Family / Caregiver informed of clinical course, medical decision-making process, and agree with plan.   I discussed return precautions, follow-up instructions, and discharged instructions with patient and/or family.   Discharge instructions:  After discussion with the psychiatrist, she has recommended that you follow-up with your outpatient psychiatrist and therapist.  Return to the emergency department immediately for any worsening condition including thoughts of wanting to hurt yourself or others, confusion or altered mental status, or any other symptoms concerning to you.   ____________________________________________   FINAL CLINICAL IMPRESSION(S) / ED DIAGNOSES  Final diagnoses:  Auditory hallucinations        Corey RooksLord, Alaney Witter, MD 07/15/18 [phone]

## 2018-07-15 NOTE — ED Notes (Signed)
Pt has one pair brown boots, one pair jeans, yellow shirt and gray jacket as well as one nose ring, lip ring, brown finger ring, and cheek piercing. Of note, pt has medical alert bracelet for Pacemaker with full name and DOB on it also places in white belongings bag and labeled.

## 2018-07-15 NOTE — ED Notes (Signed)
Patient discharged home, he is waitiing in the lobby for his mother to come and pick him up patient received discharge papers. Patient received belongings and verbalized he has received all of his belongings. Patient appropriate and cooperative, Denies SI/HI AVH. Vital signs taken. NAD noted.

## 2018-07-17 ENCOUNTER — Emergency Department
Admission: EM | Admit: 2018-07-17 | Discharge: 2018-07-17 | Disposition: A | Discharge status: Home / Self Care | Payer: BLUE CROSS/BLUE SHIELD | Attending: Emergency Medicine | Admitting: Emergency Medicine

## 2018-07-17 ENCOUNTER — Other Ambulatory Visit: Payer: Self-pay

## 2018-07-17 ENCOUNTER — Encounter: Payer: Self-pay | Admitting: Emergency Medicine

## 2018-07-17 DIAGNOSIS — F259 Schizoaffective disorder, unspecified: Secondary | ICD-10-CM | POA: Insufficient documentation

## 2018-07-17 DIAGNOSIS — Y69 Unspecified misadventure during surgical and medical care: Secondary | ICD-10-CM

## 2018-07-17 DIAGNOSIS — I10 Essential (primary) hypertension: Secondary | ICD-10-CM | POA: Insufficient documentation

## 2018-07-17 DIAGNOSIS — F17220 Nicotine dependence, chewing tobacco, uncomplicated: Secondary | ICD-10-CM | POA: Diagnosis not present

## 2018-07-17 DIAGNOSIS — Z79899 Other long term (current) drug therapy: Secondary | ICD-10-CM | POA: Insufficient documentation

## 2018-07-17 HISTORY — DX: Schizoaffective disorder, unspecified: F25.9

## 2018-07-17 NOTE — Discharge Instructions (Signed)
I'm sorry you've had such a rough night.  Please follow up with both your PMD and your psychiatrist as scheduled.  Return to the ED for any concerns.    It was a pleasure to take care of you today, and thank you for coming to our emergency department.  If you have any questions or concerns before leaving please ask the nurse to grab me and I'm more than happy to go through your aftercare instructions again.  If you have any concerns once you are home that you are not improving or are in fact getting worse before you can make it to your follow-up appointment, please do not hesitate to call 911 and come back for further evaluation.  Merrily BrittleNeil Agnes Probert, MD
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
## 2018-07-17 NOTE — ED Notes (Addendum)
ED Provider at bedside. 

## 2018-07-24 ENCOUNTER — Other Ambulatory Visit: Payer: Self-pay | Admitting: Psychiatry

## 2018-07-29 ENCOUNTER — Other Ambulatory Visit: Payer: Self-pay

## 2018-07-29 ENCOUNTER — Emergency Department: Payer: BLUE CROSS/BLUE SHIELD

## 2018-07-29 ENCOUNTER — Emergency Department
Admission: EM | Admit: 2018-07-29 | Discharge: 2018-07-30 | Disposition: A | Discharge status: Home / Self Care | Payer: BLUE CROSS/BLUE SHIELD | Attending: Emergency Medicine | Admitting: Emergency Medicine

## 2018-07-29 DIAGNOSIS — Z79899 Other long term (current) drug therapy: Secondary | ICD-10-CM | POA: Insufficient documentation

## 2018-07-29 DIAGNOSIS — R079 Chest pain, unspecified: Secondary | ICD-10-CM | POA: Diagnosis present

## 2018-07-29 DIAGNOSIS — R0789 Other chest pain: Secondary | ICD-10-CM | POA: Diagnosis not present

## 2018-07-29 DIAGNOSIS — F17228 Nicotine dependence, chewing tobacco, with other nicotine-induced disorders: Secondary | ICD-10-CM | POA: Diagnosis not present

## 2018-07-29 DIAGNOSIS — I10 Essential (primary) hypertension: Secondary | ICD-10-CM | POA: Diagnosis not present

## 2018-07-29 DIAGNOSIS — Z950 Presence of cardiac pacemaker: Secondary | ICD-10-CM | POA: Insufficient documentation

## 2018-07-29 LAB — COMPREHENSIVE METABOLIC PANEL WITH GFR
ALT: 23 U/L (ref 0–44)
AST: 16 U/L (ref 15–41)
Albumin: 3.8 g/dL (ref 3.5–5.0)
Alkaline Phosphatase: 64 U/L (ref 38–126)
Anion gap: 6 (ref 5–15)
BUN: 23 mg/dL — ABNORMAL HIGH (ref 6–20)
CO2: 26 mmol/L (ref 22–32)
Calcium: 8.5 mg/dL — ABNORMAL LOW (ref 8.9–10.3)
Chloride: 109 mmol/L (ref 98–111)
Creatinine, Ser: 0.94 mg/dL (ref 0.61–1.24)
GFR calc Af Amer: >60 mL/min (ref >60)
GFR calc non Af Amer: >60 mL/min (ref >60)
Glucose, Bld: 89 mg/dL (ref 70–99)
Potassium: 3.9 mmol/L (ref 3.5–5.1)
Sodium: 141 mmol/L (ref 135–145)
Total Bilirubin: 0.5 mg/dL (ref 0.3–1.2)
Total Protein: 6.8 g/dL (ref 6.5–8.1)

## 2018-07-29 LAB — CBC
HCT: 45.1 % (ref 39.0–52.0)
Hemoglobin: 14.6 g/dL (ref 13.0–17.0)
MCH: 29.2 pg (ref 26.0–34.0)
MCHC: 32.4 g/dL (ref 30.0–36.0)
MCV: 90.2 fL (ref 80.0–100.0)
Platelets: 276 K/uL (ref 150–400)
RBC: 5 MIL/uL (ref 4.22–5.81)
RDW: 13.1 % (ref 11.5–15.5)
WBC: 8.9 K/uL (ref 4.0–10.5)
nRBC: 0 % (ref 0.0–0.2)

## 2018-07-29 LAB — TROPONIN I
Troponin I: <0.03 ng/mL (ref <0.03)
Troponin I: <0.03 ng/mL (ref <0.03)

## 2018-07-29 MED ORDER — MORPHINE SULFATE (PF) 4 MG/ML IV SOLN
4.0000 mg | Freq: Once | INTRAVENOUS | Status: AC
  Administered 2018-07-29: 4 mg via INTRAVENOUS
  Filled 2018-07-29: qty 1

## 2018-07-29 MED ORDER — ONDANSETRON HCL 4 MG/2ML IJ SOLN
4.0000 mg | Freq: Once | INTRAMUSCULAR | Status: AC
  Administered 2018-07-29: 4 mg via INTRAVENOUS
  Filled 2018-07-29: qty 2

## 2018-07-29 NOTE — ED Provider Notes (Signed)
[facility] Emergency Department Provider Note   ____________________________________________    I have reviewed the triage vital signs and the nursing notes.   HISTORY  Chief Complaint Chest Pain     HPI Corey [name] is a 32 y.o. male with a history of sick sinus syndrome who presents with chest pain.  Patient reports he has been doing relatively well since having a pacemaker placed by Dr. Darrold Junker of cardiology several months ago.  Today however he developed chest pain which he describes as pressure-like pain in the center of his chest while he was driving home.  He took several nitroglycerin with little improvement.  He denies shortness of breath.  No pleurisy.  No calf pain or swelling.  Continues to have mild chest pain here.  Fevers chills or cough   Past Medical History:  Diagnosis Date  . Bipolar 1 disorder (HCC)   . GERD (gastroesophageal reflux disease)   . HTN (hypertension)   . Schizoaffective disorder (HCC)   . 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 Psychosis (HCC) 06/29/2018  . Bipolar 1 disorder most recent episode depressed with psychotic features 05/04/2018  . Syncope and collapse 04/29/2018  . Hypotension 04/29/2018  . Bradycardia 04/24/2018  . 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 Kidney stone 03/26/2018    Past Surgical History:  Procedure Laterality Date  . ESOPHAGOGASTRODUODENOSCOPY (EGD) WITH PROPOFOL N/A 04/26/2018   Procedure: ESOPHAGOGASTRODUODENOSCOPY (EGD) WITH PROPOFOL;  Surgeon: Wyline Mood, MD;  Location: [facility] ENDOSCOPY;  Service: Gastroenterology;  Laterality: N/A;  . KNEE ARTHROSCOPY    . PACEMAKER INSERTION Left 05/15/2018   Procedure: INSERTION PACEMAKER;  Surgeon: Marcina Millard, MD;  Location: [facility] ORS;  Service: Cardiovascular;  Laterality: Left;    Prior to Admission medications   Medication Sig  Start Date End Date Taking? Authorizing Provider  ALPRAZolam (XANAX XR) 2 MG 24 hr tablet Take 2 mg by mouth every morning.    [provider]  ALPRAZolam Prudy Feeler) 1 MG tablet Take 1 mg by mouth 2 (two) times daily as needed for anxiety.    [provider]  divalproex (DEPAKOTE ER) 500 MG 24 hr tablet Take 2 tablets (1,000 mg total) by mouth at bedtime. Patient taking differently: Take 1,500 mg by mouth at bedtime.  07/04/18   Hessie Knows, MD  FLUoxetine (PROZAC) 40 MG capsule Take 1 capsule (40 mg total) by mouth daily. Patient taking differently: Take 60 mg by mouth daily.  07/04/18   Hessie Knows, MD  nitroGLYCERIN (NITROSTAT) 0.4 MG SL tablet Place 1 tablet (0.4 mg total) under the tongue every 5 (five) minutes as needed for chest pain. 05/28/18 05/28/19  Nita Sickle, MD  pantoprazole (PROTONIX) 40 MG tablet Take 1 tablet (40 mg total) by mouth daily. 07/04/18 09/02/18  Hessie Knows, MD  risperiDONE (RISPERDAL) 3 MG tablet Take 1 tablet (3 mg total) by mouth at bedtime. Patient taking differently: Take 1.5 mg by mouth at bedtime.  07/04/18   Hessie Knows, MD  sucralfate (CARAFATE) 1 g tablet Take 1 tablet (1 g total) by mouth 4 (four) times daily. 04/01/18   Sharman Cheek, MD     Allergies Penicillins and Sulfa antibiotics  Family History  Problem Relation Age of Onset  . Kidney cancer Neg Hx   . Bladder Cancer Neg Hx   . Prostate cancer Neg Hx     Social History Social  History   Tobacco Use  . Smoking status: Former Games developermoker  . Smokeless tobacco: Current User    Types: Snuff  Substance Use Topics  . Alcohol use: No  . Drug use: Not Currently    Types: Marijuana    Review of Systems  Constitutional: No fever/chills Eyes: No visual changes.  ENT: No sore throat. Cardiovascular: As above Respiratory: Denies shortness of breath. Gastrointestinal: No abdominal pain.  No nausea, no vomiting.   Genitourinary: Negative for  dysuria. Musculoskeletal: Negative for back pain. Skin: Negative for rash. Neurological: Negative for headaches    ____________________________________________   PHYSICAL EXAM:  VITAL SIGNS: ED Triage Vitals [07/29/18 2058]  Enc Vitals Group     BP 120/86     Pulse Rate 77     Resp 16     Temp 98.5 F (36.9 C)     Temp src      SpO2 96 %     Weight 124.7 kg (275 lb)     Height 1.803 m (5\' 11" )     Head Circumference      Peak Flow      Pain Score 6     Pain Loc      Pain Edu?      Excl. in GC?     Constitutional: Alert and oriented.  Pleasant and interactive Eyes: Conjunctivae are normal.  Head: Atraumatic.  Nose: No congestion/rhinnorhea. Mouth/Throat: Mucous membranes are moist.    Cardiovascular: Normal rate, regular rhythm. Grossly normal heart sounds.  Good peripheral circulation. Respiratory: Normal respiratory effort.  No retractions. Lungs CTAB. Gastrointestinal: Soft and nontender. No distention.  No CVA tenderness. Genitourinary: deferred Musculoskeletal: No lower extremity tenderness nor edema.  Warm and well perfused Neurologic:  Normal speech and language. No gross focal neurologic deficits are appreciated.  Skin:  Skin is warm, dry and intact. No rash noted. Psychiatric: Mood and affect are normal. Speech and behavior are normal.  ____________________________________________   LABS (all labs ordered are listed, but only abnormal results are displayed)  Labs Reviewed  COMPREHENSIVE METABOLIC PANEL - Abnormal; Notable for the following components:      Result Value   BUN 23 (*)    Calcium 8.5 (*)    All other components within normal limits  CBC  TROPONIN I  TROPONIN I   ____________________________________________  EKG  ED ECG REPORT I, Jene Everyobert Princesa Willig, the attending physician, personally viewed and interpreted this ECG.  Date: 07/29/2018  Rhythm: normal sinus rhythm QRS Axis: normal Intervals: normal ST/T Wave abnormalities:  normal Narrative Interpretation: no evidence of acute ischemia  ____________________________________________  RADIOLOGY  Chest x-ray is normal ____________________________________________   PROCEDURES  Procedure(s) performed: No  Procedures   Critical Care performed: No ____________________________________________   INITIAL IMPRESSION / ASSESSMENT AND PLAN / ED COURSE  Pertinent labs & imaging results that were available during my care of the patient were reviewed by me and considered in my medical decision making (see chart for details).  Patient presents with chest pain as described above.  EKG is reassuring.  Initial troponin is normal.  Patient had normal exercise stress test in the past, did not see any record of cardiac catheterization but given normal EKG and normal troponin low likelihood of ACS.  Treated with IV morphine, IV Zofran for pain with significant improvement.  Will send second troponin  I have asked Dr. Manson PasseyBrown to follow-up on second troponin, if normal anticipate discharge    ____________________________________________   FINAL CLINICAL IMPRESSION(S) /  ED DIAGNOSES  Final diagnoses:  Atypical chest pain        Note:  This document was prepared using Dragon voice recognition software and may include unintentional dictation errors.    Jene EveryKinner, Aayush Gelpi, MD 07/29/18 2239

## 2018-07-29 NOTE — ED Triage Notes (Signed)
Pt arrived via ems with substernal CP that began earlier today. Pt has had a pacemaker placed her this past Oct. Pt rates pain 6/10. NAD at present, respirations even and non labored.

## 2018-07-31 ENCOUNTER — Emergency Department
Admission: EM | Admit: 2018-07-31 | Discharge: 2018-08-01 | Disposition: A | Discharge status: Home / Self Care | Payer: BLUE CROSS/BLUE SHIELD | Attending: Emergency Medicine | Admitting: Emergency Medicine

## 2018-07-31 ENCOUNTER — Other Ambulatory Visit: Payer: Self-pay

## 2018-07-31 DIAGNOSIS — Z87891 Personal history of nicotine dependence: Secondary | ICD-10-CM | POA: Diagnosis not present

## 2018-07-31 DIAGNOSIS — I10 Essential (primary) hypertension: Secondary | ICD-10-CM | POA: Insufficient documentation

## 2018-07-31 DIAGNOSIS — R55 Syncope and collapse: Secondary | ICD-10-CM | POA: Insufficient documentation

## 2018-07-31 DIAGNOSIS — Z950 Presence of cardiac pacemaker: Secondary | ICD-10-CM | POA: Diagnosis not present

## 2018-07-31 DIAGNOSIS — Z79899 Other long term (current) drug therapy: Secondary | ICD-10-CM | POA: Insufficient documentation

## 2018-07-31 LAB — CBC WITH DIFFERENTIAL/PLATELET
Abs Immature Granulocytes: 0.03 10*3/uL (ref 0.00–0.07)
BASOS ABS: 0.1 10*3/uL (ref 0.0–0.1)
Basophils Relative: 1 %
Eosinophils Absolute: 0.5 10*3/uL (ref 0.0–0.5)
Eosinophils Relative: 6 %
HEMATOCRIT: 41.8 % (ref 39.0–52.0)
Hemoglobin: 13.8 g/dL (ref 13.0–17.0)
Immature Granulocytes: 0 %
Lymphocytes Relative: 29 %
Lymphs Abs: 2.5 10*3/uL (ref 0.7–4.0)
MCH: 29.5 pg (ref 26.0–34.0)
MCHC: 33 g/dL (ref 30.0–36.0)
MCV: 89.3 fL (ref 80.0–100.0)
Monocytes Absolute: 0.6 10*3/uL (ref 0.1–1.0)
Monocytes Relative: 7 %
NRBC: 0 % (ref 0.0–0.2)
Neutro Abs: 4.9 10*3/uL (ref 1.7–7.7)
Neutrophils Relative %: 57 %
Platelets: 248 10*3/uL (ref 150–400)
RBC: 4.68 MIL/uL (ref 4.22–5.81)
RDW: 12.7 % (ref 11.5–15.5)
WBC: 8.5 10*3/uL (ref 4.0–10.5)

## 2018-07-31 LAB — COMPREHENSIVE METABOLIC PANEL
ALT: 21 U/L (ref 0–44)
AST: 18 U/L (ref 15–41)
Albumin: 3.5 g/dL (ref 3.5–5.0)
Alkaline Phosphatase: 61 U/L (ref 38–126)
Anion gap: 4 — ABNORMAL LOW (ref 5–15)
BUN: 16 mg/dL (ref 6–20)
CO2: 25 mmol/L (ref 22–32)
Calcium: 7.9 mg/dL — ABNORMAL LOW (ref 8.9–10.3)
Chloride: 109 mmol/L (ref 98–111)
Creatinine, Ser: 0.82 mg/dL (ref 0.61–1.24)
GFR calc non Af Amer: >60 mL/min (ref >60)
Glucose, Bld: 98 mg/dL (ref 70–99)
Potassium: 3.9 mmol/L (ref 3.5–5.1)
Sodium: 138 mmol/L (ref 135–145)
Total Bilirubin: 0.3 mg/dL (ref 0.3–1.2)
Total Protein: 6.3 g/dL — ABNORMAL LOW (ref 6.5–8.1)

## 2018-07-31 LAB — INFLUENZA PANEL BY PCR (TYPE A & B)
Influenza A By PCR: NEGATIVE
Influenza B By PCR: NEGATIVE

## 2018-07-31 LAB — URINE DRUG SCREEN, QUALITATIVE (ARMC ONLY)
Amphetamines, Ur Screen: NOT DETECTED
Barbiturates, Ur Screen: NOT DETECTED
Benzodiazepine, Ur Scrn: NOT DETECTED
Cannabinoid 50 Ng, Ur ~~LOC~~: POSITIVE — AB
Cocaine Metabolite,Ur ~~LOC~~: NOT DETECTED
MDMA (ECSTASY) UR SCREEN: NOT DETECTED
Methadone Scn, Ur: NOT DETECTED
Opiate, Ur Screen: NOT DETECTED
Phencyclidine (PCP) Ur S: NOT DETECTED
Tricyclic, Ur Screen: NOT DETECTED

## 2018-07-31 LAB — GLUCOSE, CAPILLARY: Glucose-Capillary: 95 mg/dL (ref 70–99)

## 2018-07-31 LAB — TROPONIN I: Troponin I: <0.03 ng/mL (ref <0.03)

## 2018-07-31 NOTE — ED Triage Notes (Signed)
[facility]. Patient 32 yo male with history of heart issues and had pacemaker placed in 05/2018. Patient states he vomited at 4am 07/31/18 since then he has been feeling light headed and fell and hit head on left side of forehead. Patient has a 18ga in left AC. Patient states his family in the household all have flu.

## 2018-07-31 NOTE — ED Notes (Deleted)
Patient arrived to ED by [facility]. Patient 32 yo male with history of heart issues and had pacemaker placed in 05/2018. Patient states he vomited at 4am 07/31/18 since then he has been feeling light headed and fell and hit head on left side of forehead. Patient has a 18ga in left AC. Patient states his family in the household all have flu.

## 2018-07-31 NOTE — ED Provider Notes (Signed)
[facility] Emergency Department Provider Note  ____________________________________________   None    (approximate)  I have reviewed the triage vital signs and the nursing notes.   HISTORY  Chief Complaint No chief complaint on file.   HPI Corey [name] is a 32 y.o. male who presents to the ER for a syncopal episode. He had an episode of vomiting earlier today. Multiple family members have been ill with a stomach virus. He didn't feel well most of the day. He stood up and then passed out. Similar episodes in the past. He has a pacemaker that was placed in October. He has followed up with cardiology and they have adjusted his rate which has helped some. He now feels back to normal with the exception of being a little dizzy.   Past Medical History:  Diagnosis Date  . Bipolar 1 disorder (HCC)   . GERD (gastroesophageal reflux disease)   . HTN (hypertension)   . Schizoaffective disorder (HCC)   . 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 Darrold JunkerParaschos    Patient Active Problem List   Diagnosis Date Noted  . Psychosis (HCC) 06/29/2018  . Bipolar 1 disorder most recent episode depressed with psychotic features 05/04/2018  . Syncope and collapse 04/29/2018  . Hypotension 04/29/2018  . Bradycardia 04/24/2018  . 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 Kidney stone 03/26/2018    Past Surgical History:  Procedure Laterality Date  . ESOPHAGOGASTRODUODENOSCOPY (EGD) WITH PROPOFOL N/A 04/26/2018   Procedure: ESOPHAGOGASTRODUODENOSCOPY (EGD) WITH PROPOFOL;  Surgeon: Wyline MoodAnna, Kiran, MD;  [facility] ENDOSCOPY;  Service: Gastroenterology;  Laterality: N/A;  . KNEE ARTHROSCOPY    . PACEMAKER INSERTION Left 05/15/2018   Procedure: INSERTION PACEMAKER;  Surgeon: Marcina MillardParaschos, Alexander, MD;  Location: [facility] ORS;  Service: Cardiovascular;  Laterality: Left;    Prior to Admission medications   Medication Sig Start Date End Date Taking? Authorizing  Provider  ALPRAZolam (XANAX XR) 2 MG 24 hr tablet Take 2 mg by mouth every morning.    [provider]  ALPRAZolam Prudy Feeler(XANAX) 1 MG tablet Take 1 mg by mouth 2 (two) times daily as needed for anxiety.    [provider]  divalproex (DEPAKOTE ER) 500 MG 24 hr tablet Take 2 tablets (1,000 mg total) by mouth at bedtime. Patient taking differently: Take 1,500 mg by mouth at bedtime.  07/04/18   Hessie Knows'Neal, Sarita, MD  FLUoxetine (PROZAC) 40 MG capsule Take 1 capsule (40 mg total) by mouth daily. Patient taking differently: Take 60 mg by mouth daily.  07/04/18   Hessie Knows'Neal, Sarita, MD  nitroGLYCERIN (NITROSTAT) 0.4 MG SL tablet Place 1 tablet (0.4 mg total) under the tongue every 5 (five) minutes as needed for chest pain. 05/28/18 05/28/19  Nita SickleVeronese, [place], MD  pantoprazole (PROTONIX) 40 MG tablet Take 1 tablet (40 mg total) by mouth daily. 07/04/18 09/02/18  Hessie Knows'Neal, Sarita, MD  risperiDONE (RISPERDAL) 3 MG tablet Take 1 tablet (3 mg total) by mouth at bedtime. Patient taking differently: Take 1.5 mg by mouth at bedtime.  07/04/18   Hessie Knows'Neal, Sarita, MD  sucralfate (CARAFATE) 1 g tablet Take 1 tablet (1 g total) by mouth 4 (four) times daily. 04/01/18   Sharman CheekStafford, Phillip, MD    Allergies Penicillins and Sulfa antibiotics  Family History  Problem Relation Age of Onset  . Kidney cancer Neg Hx   . Bladder Cancer Neg Hx   . Prostate cancer Neg Hx     Social History Social History   Tobacco Use  .  Smoking status: Former Games developermoker  . Smokeless tobacco: Current User    Types: Snuff  Substance Use Topics  . Alcohol use: No  . Drug use: Not Currently    Types: Marijuana    Review of Systems  Constitutional: No fever/chills Eyes: No visual changes. ENT: No sore throat. Cardiovascular: Denies chest pain. Respiratory: Denies shortness of breath. Gastrointestinal: No abdominal pain. Positive for vomiting this am Genitourinary: Negative for dysuria. Musculoskeletal: Negative for back  pain. Skin: Negative for rash. Neurological: Negative for headaches, focal weakness or numbness. ____________________________________________   PHYSICAL EXAM:  VITAL SIGNS: ED Triage Vitals  Enc Vitals Group     BP 07/31/18 1946 126/83     Pulse Rate 07/31/18 1946 72     Resp 07/31/18 1946 16     Temp 07/31/18 1946 99 F (37.2 C)     Temp Source 07/31/18 1946 Oral     SpO2 07/31/18 1943 98 %     Weight 07/31/18 1947 270 lb (122.5 kg)     Height 07/31/18 1947 5\' 11"  (1.803 m)     Head Circumference --      Peak Flow --      Pain Score 07/31/18 1947 0     Pain Loc --      Pain Edu? --      Excl. in GC? --     Constitutional: Alert and oriented. Well appearing and in no acute distress. Eyes: Conjunctivae are normal. PERRL. EOMI. Head: Erythematous area on forehead. Nose: No congestion/rhinnorhea. Mouth/Throat: Mucous membranes are moist.  Oropharynx non-erythematous. Neck: No stridor.   Cardiovascular: Normal rate, regular rhythm. Grossly normal heart sounds.  Good peripheral circulation. Respiratory: Normal respiratory effort.  No retractions. Lungs CTAB. Gastrointestinal: Soft and nontender. No distention. No abdominal bruits. No CVA tenderness. Musculoskeletal: No lower extremity tenderness nor edema.  No joint effusions. Neurologic:  Normal speech and language. No gross focal neurologic deficits are appreciated. No gait instability. Skin:  Skin is warm, dry and intact. No rash noted. Psychiatric: Mood and affect are normal. Speech and behavior are normal.  ____________________________________________   LABS (all labs ordered are listed, but only abnormal results are displayed)  Labs Reviewed  URINE DRUG SCREEN, QUALITATIVE ([facility] ONLY) - Abnormal; Notable for the following components:      Result Value   Cannabinoid 50 Ng, Ur [place]ella POSITIVE (*)    All other components within normal limits  COMPREHENSIVE METABOLIC PANEL - Abnormal; Notable for the following components:    Calcium 7.9 (*)    Total Protein 6.3 (*)    Anion gap 4 (*)    All other components within normal limits  GLUCOSE, CAPILLARY  INFLUENZA PANEL BY PCR (TYPE A & B)  CBC WITH DIFFERENTIAL/PLATELET  TROPONIN I   ____________________________________________  EKG  ED ECG REPORT I, Rease Wence, FNP-BC, personally viewed and interpreted this ECG.   Date: 07/31/2018  EKG Time: 2212  Rate: 63  Rhythm: Atrial paced, normal sinus rhythm, unchanged from previous tracings  Axis: left  Intervals:none  ST&T Change: none  ____________________________________________  RADIOLOGY  ED MD interpretation:  Not indicated.  Official radiology report(s): No results found.  ____________________________________________   PROCEDURES  Procedure(s) performed: None  Procedures  Critical Care performed: No  ____________________________________________   INITIAL IMPRESSION / ASSESSMENT AND PLAN / ED COURSE  As part of my medical decision making, I reviewed the following data within the electronic MEDICAL RECORD NUMBER Notes from prior ED visits   32 year  old male presents after a syncopal episode. Since being here, he is back to his baseline. Neuro exam is normal. CT head not indicated at this time. Orthostatic vitals are reassuring. He was able to stand without near syncope/syncopal episode. He was encouraged to call cardiology tomorrow to schedule an appointment. He was advised to return to the ER for symptoms that change or worsen.      ____________________________________________   FINAL CLINICAL IMPRESSION(S) / ED DIAGNOSES  Final diagnoses:  Syncope, unspecified syncope type     ED Discharge Orders    None       Note:  This document was prepared using Dragon voice recognition software and may include unintentional dictation errors.    Chinita Pester, FNP 08/01/18 0245    Phineas Semen, MD 08/03/18 1730

## 2018-07-31 NOTE — ED Notes (Signed)
Patient in room in bed resting

## 2018-08-01 NOTE — ED Notes (Signed)
Reviewed discharge paperwork with patient. Patient states understanding of paperwork. Patient signed discharge.

## 2018-08-01 NOTE — Discharge Instructions (Signed)
Stand slowly and once you are up, stand still until you get your balance.  Follow up with cardiology as soon as possible.  Return to the ER for symptoms of concern if unable to see primary care or cardiology.

## 2018-08-06 ENCOUNTER — Emergency Department
Admission: EM | Admit: 2018-08-06 | Discharge: 2018-08-07 | Disposition: A | Discharge status: Home / Self Care | Payer: BLUE CROSS/BLUE SHIELD | Attending: Emergency Medicine | Admitting: Emergency Medicine

## 2018-08-06 ENCOUNTER — Other Ambulatory Visit: Payer: Self-pay

## 2018-08-06 DIAGNOSIS — I10 Essential (primary) hypertension: Secondary | ICD-10-CM | POA: Diagnosis not present

## 2018-08-06 DIAGNOSIS — R55 Syncope and collapse: Secondary | ICD-10-CM

## 2018-08-06 DIAGNOSIS — Z87891 Personal history of nicotine dependence: Secondary | ICD-10-CM | POA: Diagnosis not present

## 2018-08-06 DIAGNOSIS — Z950 Presence of cardiac pacemaker: Secondary | ICD-10-CM | POA: Insufficient documentation

## 2018-08-06 DIAGNOSIS — Z79899 Other long term (current) drug therapy: Secondary | ICD-10-CM | POA: Diagnosis not present

## 2018-08-06 DIAGNOSIS — E860 Dehydration: Secondary | ICD-10-CM

## 2018-08-06 LAB — URINALYSIS, COMPLETE (UACMP) WITH MICROSCOPIC
Bilirubin Urine: NEGATIVE
Glucose, UA: NEGATIVE mg/dL
Hgb urine dipstick: NEGATIVE
Ketones, ur: NEGATIVE mg/dL
LEUKOCYTES UA: NEGATIVE
Nitrite: NEGATIVE
Protein, ur: NEGATIVE mg/dL
SPECIFIC GRAVITY, URINE: 1.02 (ref 1.005–1.030)
Squamous Epithelial / HPF: NONE SEEN (ref 0–5)
pH: 5 (ref 5.0–8.0)

## 2018-08-06 LAB — CBC
HCT: 47.4 % (ref 39.0–52.0)
Hemoglobin: 15.8 g/dL (ref 13.0–17.0)
MCH: 29.2 pg (ref 26.0–34.0)
MCHC: 33.3 g/dL (ref 30.0–36.0)
MCV: 87.6 fL (ref 80.0–100.0)
NRBC: 0 % (ref 0.0–0.2)
Platelets: 290 10*3/uL (ref 150–400)
RBC: 5.41 MIL/uL (ref 4.22–5.81)
RDW: 13 % (ref 11.5–15.5)
WBC: 9.2 10*3/uL (ref 4.0–10.5)

## 2018-08-06 LAB — BASIC METABOLIC PANEL
ANION GAP: 8 (ref 5–15)
BUN: 17 mg/dL (ref 6–20)
CO2: 23 mmol/L (ref 22–32)
Calcium: 8.7 mg/dL — ABNORMAL LOW (ref 8.9–10.3)
Chloride: 110 mmol/L (ref 98–111)
Creatinine, Ser: 1.31 mg/dL — ABNORMAL HIGH (ref 0.61–1.24)
GFR calc Af Amer: >60 mL/min (ref >60)
GFR calc non Af Amer: >60 mL/min (ref >60)
Glucose, Bld: 95 mg/dL (ref 70–99)
Potassium: 3.9 mmol/L (ref 3.5–5.1)
Sodium: 141 mmol/L (ref 135–145)

## 2018-08-06 MED ORDER — SODIUM CHLORIDE 0.9 % IV BOLUS
1000.0000 mL | Freq: Once | INTRAVENOUS | Status: AC
  Administered 2018-08-06: 1000 mL via INTRAVENOUS

## 2018-08-06 NOTE — ED Triage Notes (Signed)
Patient to ED via EMS.  Patient reports he passed out twice today and states 2nd time he was talking to his wife.

## 2018-08-06 NOTE — Discharge Instructions (Addendum)
Please seek medical attention for any high fevers, chest pain, shortness of breath, change in behavior, persistent vomiting, bloody stool or any other new or concerning symptoms.  

## 2018-08-06 NOTE — ED Provider Notes (Signed)
[facility] Emergency Department Provider Note   ____________________________________________   I have reviewed the triage vital signs and the nursing notes.   HISTORY  Chief Complaint Loss of Consciousness   History limited by: Not Limited   HPI Corey [name] is a 33 y.o. male who presents to the emergency department today because of concern for syncopal and possible seizure activity. The patient states that he had two syncopal episodes today. He does have a history of frequent syncopal episodes and has followed up with cardiology (pacemaker was put in) and is now following up with neurology in case they are seizures. He states he did urinate on himself during the second episode. Denies any chest pain or palpitations. Denies any recent fevers.   Per medical record review patient has a history of bipolar, frequent ed visits for syncopal episodes, pacemaker implantation.  Past Medical History:  Diagnosis Date  . Bipolar 1 disorder (HCC)   . GERD (gastroesophageal reflux disease)   . HTN (hypertension)   . Schizoaffective disorder (HCC)   . 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 Darrold JunkerParaschos    Patient Active Problem List   Diagnosis Date Noted  . Psychosis (HCC) 06/29/2018  . Bipolar 1 disorder most recent episode depressed with psychotic features 05/04/2018  . Syncope and collapse 04/29/2018  . Hypotension 04/29/2018  . Bradycardia 04/24/2018  . 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 Kidney stone 03/26/2018    Past Surgical History:  Procedure Laterality Date  . ESOPHAGOGASTRODUODENOSCOPY (EGD) WITH PROPOFOL N/A 04/26/2018   Procedure: ESOPHAGOGASTRODUODENOSCOPY (EGD) WITH PROPOFOL;  Surgeon: Wyline MoodAnna, Kiran, MD;  [facility];  Service: Gastroenterology;  Laterality: N/A;  . KNEE ARTHROSCOPY    . PACEMAKER INSERTION Left 05/15/2018   Procedure: INSERTION PACEMAKER;  Surgeon: Marcina MillardParaschos, Alexander, MD;  Location:  [facility] ORS;  Service: Cardiovascular;  Laterality: Left;    Prior to Admission medications   Medication Sig Start Date End Date Taking? Authorizing Provider  ALPRAZolam (XANAX XR) 2 MG 24 hr tablet Take 2 mg by mouth every morning.    [provider]  ALPRAZolam Prudy Feeler(XANAX) 1 MG tablet Take 1 mg by mouth 2 (two) times daily as needed for anxiety.    [provider]  divalproex (DEPAKOTE ER) 500 MG 24 hr tablet Take 2 tablets (1,000 mg total) by mouth at bedtime. Patient taking differently: Take 1,500 mg by mouth at bedtime.  07/04/18   Hessie Knows'Neal, Sarita, MD  FLUoxetine (PROZAC) 40 MG capsule Take 1 capsule (40 mg total) by mouth daily. Patient taking differently: Take 60 mg by mouth daily.  07/04/18   Hessie Knows'Neal, Sarita, MD  nitroGLYCERIN (NITROSTAT) 0.4 MG SL tablet Place 1 tablet (0.4 mg total) under the tongue every 5 (five) minutes as needed for chest pain. 05/28/18 05/28/19  Nita SickleVeronese, [place], MD  pantoprazole (PROTONIX) 40 MG tablet Take 1 tablet (40 mg total) by mouth daily. 07/04/18 09/02/18  Hessie Knows'Neal, Sarita, MD  risperiDONE (RISPERDAL) 3 MG tablet Take 1 tablet (3 mg total) by mouth at bedtime. Patient taking differently: Take 1.5 mg by mouth at bedtime.  07/04/18   Hessie Knows'Neal, Sarita, MD  sucralfate (CARAFATE) 1 g tablet Take 1 tablet (1 g total) by mouth 4 (four) times daily. 04/01/18   Sharman CheekStafford, Phillip, MD    Allergies Penicillins and Sulfa antibiotics  Family History  Problem Relation Age of Onset  . Kidney cancer Neg Hx   . Bladder Cancer Neg Hx   . Prostate cancer Neg Hx  Social History Social History   Tobacco Use  . Smoking status: Former Games developer  . Smokeless tobacco: Current User    Types: Snuff  Substance Use Topics  . Alcohol use: No  . Drug use: Not Currently    Types: Marijuana   Review of Systems Constitutional: No fever/chills Eyes: No visual changes. ENT: No sore throat. Cardiovascular: Denies chest pain. Respiratory: Denies shortness of  breath. Gastrointestinal: No abdominal pain.  No nausea, no vomiting.  No diarrhea.   Genitourinary: Negative for dysuria. Musculoskeletal: Negative for back pain. Skin: Negative for rash. Neurological: Positive for headache.  ____________________________________________   PHYSICAL EXAM:  VITAL SIGNS: ED Triage Vitals  Enc Vitals Group     BP 08/06/18 2020 134/83     Pulse Rate 08/06/18 2020 82     Resp 08/06/18 2020 18     Temp 08/06/18 2020 98.8 F (37.1 C)     Temp Source 08/06/18 2020 Oral     SpO2 08/06/18 2020 97 %     Weight 08/06/18 2020 270 lb (122.5 kg)     Height 08/06/18 2020 5\' 11"  (1.803 m)     Head Circumference --      Peak Flow --      Pain Score 08/06/18 2026 0   Constitutional: Alert and oriented.  Eyes: Conjunctivae are normal.  ENT      Head: Normocephalic and atraumatic.      Nose: No congestion/rhinnorhea.      Mouth/Throat: Mucous membranes are moist.      Neck: No stridor. Hematological/Lymphatic/Immunilogical: No cervical lymphadenopathy. Cardiovascular: Normal rate, regular rhythm.  No murmurs, rubs, or gallops.  Respiratory: Normal respiratory effort without tachypnea nor retractions. Breath sounds are clear and equal bilaterally. No wheezes/rales/rhonchi. Gastrointestinal: Soft and non tender. No rebound. No guarding.  Genitourinary: Deferred Musculoskeletal: Normal range of motion in all extremities. No lower extremity edema. Neurologic:  Normal speech and language. No gross focal neurologic deficits are appreciated.  Skin:  Skin is warm, dry and intact. No rash noted. Psychiatric: Mood and affect are normal. Speech and behavior are normal. Patient exhibits appropriate insight and judgment.  ____________________________________________    LABS (pertinent positives/negatives)  CBC wbc 9.2, hgb 15.8, plt 290 BMP wnl except cr 1.31, ca 8.7 UA clear, rare bacteria otherwise  unremarkable  ____________________________________________   EKG  Lurline Idol, attending physician, personally viewed and interpreted this EKG  EKG Time: 2027 Rate: 77 Rhythm: normal sinus rhythm Axis: normal Intervals: qtc 434 QRS: narrow ST changes: no st elevation Impression: normal ekg   ____________________________________________    RADIOLOGY  None  ____________________________________________   PROCEDURES  Procedures  ____________________________________________   INITIAL IMPRESSION / ASSESSMENT AND PLAN / ED COURSE  Pertinent labs & imaging results that were available during my care of the patient were reviewed by me and considered in my medical decision making (see chart for details).   Patient presented to the emergency department today after syncopal episodes today.  Patient has a history of frequent syncopal episodes that is been evaluated both in the emergency department as well as in cardiology and neurology.  Patient's blood work shows a mild elevation of the creatinine.  Patient will be given IV fluids here.  At this point feel patient can continue outpatient work-up for syncope.  ____________________________________________   FINAL CLINICAL IMPRESSION(S) / ED DIAGNOSES  Final diagnoses:  Syncope, unspecified syncope type  Dehydration     Note: This dictation was prepared with Dragon dictation. Any transcriptional  errors that result from this process are unintentional     Phineas SemenGoodman, Julie-Anne Torain, MD 08/06/18 [phone]

## 2018-08-06 NOTE — ED Notes (Signed)
Was on the phone with his wife and wife thinks he may have had a seizure. She is three hours away and called EMS. Patient has pacemaker due to bradycardia. Pulse with EMS 70-93 BP 130/74. BS 119. Patient arrives alert and oriented.

## 2018-08-13 ENCOUNTER — Emergency Department
Admission: EM | Admit: 2018-08-13 | Discharge: 2018-08-14 | Disposition: A | Discharge status: Other Acute Inpatient Hospital | Payer: BLUE CROSS/BLUE SHIELD | Source: Home / Self Care | Attending: Emergency Medicine | Admitting: Emergency Medicine

## 2018-08-13 ENCOUNTER — Other Ambulatory Visit: Payer: Self-pay

## 2018-08-13 DIAGNOSIS — F259 Schizoaffective disorder, unspecified: Secondary | ICD-10-CM

## 2018-08-13 DIAGNOSIS — Z950 Presence of cardiac pacemaker: Secondary | ICD-10-CM

## 2018-08-13 DIAGNOSIS — I10 Essential (primary) hypertension: Secondary | ICD-10-CM | POA: Insufficient documentation

## 2018-08-13 DIAGNOSIS — R443 Hallucinations, unspecified: Secondary | ICD-10-CM

## 2018-08-13 DIAGNOSIS — F319 Bipolar disorder, unspecified: Secondary | ICD-10-CM | POA: Insufficient documentation

## 2018-08-13 DIAGNOSIS — F29 Unspecified psychosis not due to a substance or known physiological condition: Secondary | ICD-10-CM

## 2018-08-13 DIAGNOSIS — Z87891 Personal history of nicotine dependence: Secondary | ICD-10-CM

## 2018-08-13 DIAGNOSIS — Z79899 Other long term (current) drug therapy: Secondary | ICD-10-CM

## 2018-08-13 DIAGNOSIS — F315 Bipolar disorder, current episode depressed, severe, with psychotic features: Secondary | ICD-10-CM | POA: Diagnosis present

## 2018-08-13 LAB — URINE DRUG SCREEN, QUALITATIVE (ARMC ONLY)
AMPHETAMINES, UR SCREEN: NOT DETECTED
Barbiturates, Ur Screen: NOT DETECTED
Benzodiazepine, Ur Scrn: POSITIVE — AB
Cannabinoid 50 Ng, Ur ~~LOC~~: POSITIVE — AB
Cocaine Metabolite,Ur ~~LOC~~: NOT DETECTED
MDMA (Ecstasy)Ur Screen: NOT DETECTED
Methadone Scn, Ur: NOT DETECTED
Opiate, Ur Screen: NOT DETECTED
PHENCYCLIDINE (PCP) UR S: NOT DETECTED
Tricyclic, Ur Screen: POSITIVE — AB

## 2018-08-13 LAB — ACETAMINOPHEN LEVEL: Acetaminophen (Tylenol), Serum: <10 ug/mL — ABNORMAL LOW (ref 10–30)

## 2018-08-13 LAB — COMPREHENSIVE METABOLIC PANEL
ALT: 22 U/L (ref 0–44)
ANION GAP: 9 (ref 5–15)
AST: 26 U/L (ref 15–41)
Albumin: 4 g/dL (ref 3.5–5.0)
Alkaline Phosphatase: 84 U/L (ref 38–126)
BUN: 15 mg/dL (ref 6–20)
CO2: 22 mmol/L (ref 22–32)
Calcium: 8.5 mg/dL — ABNORMAL LOW (ref 8.9–10.3)
Chloride: 111 mmol/L (ref 98–111)
Creatinine, Ser: 1.2 mg/dL (ref 0.61–1.24)
GFR calc Af Amer: >60 mL/min (ref >60)
GFR calc non Af Amer: >60 mL/min (ref >60)
Glucose, Bld: 145 mg/dL — ABNORMAL HIGH (ref 70–99)
Potassium: 3.8 mmol/L (ref 3.5–5.1)
Sodium: 142 mmol/L (ref 135–145)
Total Bilirubin: 0.3 mg/dL (ref 0.3–1.2)
Total Protein: 7.1 g/dL (ref 6.5–8.1)

## 2018-08-13 LAB — CBC
HCT: 44.8 % (ref 39.0–52.0)
Hemoglobin: 14.7 g/dL (ref 13.0–17.0)
MCH: 29.2 pg (ref 26.0–34.0)
MCHC: 32.8 g/dL (ref 30.0–36.0)
MCV: 88.9 fL (ref 80.0–100.0)
Platelets: 273 10*3/uL (ref 150–400)
RBC: 5.04 MIL/uL (ref 4.22–5.81)
RDW: 12.6 % (ref 11.5–15.5)
WBC: 8.6 10*3/uL (ref 4.0–10.5)
nRBC: 0 % (ref 0.0–0.2)

## 2018-08-13 LAB — ETHANOL: Alcohol, Ethyl (B): <10 mg/dL (ref <10)

## 2018-08-13 LAB — SALICYLATE LEVEL: Salicylate Lvl: <7 mg/dL (ref 2.8–30.0)

## 2018-08-13 LAB — VALPROIC ACID LEVEL

## 2018-08-13 MED ORDER — NITROGLYCERIN 0.4 MG SL SUBL
0.4000 mg | SUBLINGUAL_TABLET | SUBLINGUAL | Status: DC | PRN
  Filled 2018-08-13: qty 1

## 2018-08-13 MED ORDER — ZOLPIDEM TARTRATE 5 MG PO TABS
5.0000 mg | ORAL_TABLET | Freq: Every evening | ORAL | Status: DC | PRN
  Administered 2018-08-14: 5 mg via ORAL
  Filled 2018-08-13: qty 1

## 2018-08-13 MED ORDER — PANTOPRAZOLE SODIUM 40 MG PO TBEC
40.0000 mg | DELAYED_RELEASE_TABLET | Freq: Every day | ORAL | Status: DC
  Administered 2018-08-14: 40 mg via ORAL
  Filled 2018-08-13: qty 1

## 2018-08-13 MED ORDER — ALPRAZOLAM 0.5 MG PO TABS
1.0000 mg | ORAL_TABLET | Freq: Two times a day (BID) | ORAL | Status: DC | PRN
  Administered 2018-08-14: 1 mg via ORAL
  Filled 2018-08-13: qty 2

## 2018-08-13 MED ORDER — SUCRALFATE 1 G PO TABS
1.0000 g | ORAL_TABLET | Freq: Four times a day (QID) | ORAL | Status: DC
  Administered 2018-08-14 (×2): 1 g via ORAL
  Filled 2018-08-13 (×5): qty 1

## 2018-08-13 MED ORDER — RISPERIDONE 1 MG PO TABS: 1.5000 mg | ORAL_TABLET | Freq: Every day | ORAL | Status: DC

## 2018-08-13 MED ORDER — DIVALPROEX SODIUM ER 500 MG PO TB24
1500.0000 mg | ORAL_TABLET | Freq: Every day | ORAL | Status: DC
  Administered 2018-08-14: 1500 mg via ORAL
  Filled 2018-08-13: qty 3

## 2018-08-13 MED ORDER — ALPRAZOLAM ER 1 MG PO TB24: 2.0000 mg | ORAL_TABLET | ORAL | Status: DC

## 2018-08-13 NOTE — ED Notes (Signed)
Pt eating sandwich tray  

## 2018-08-13 NOTE — ED Notes (Signed)
Report to Dr Henderson Cloud, [facility] finished att

## 2018-08-13 NOTE — ED Provider Notes (Signed)
[facility] Emergency Department Provider Note   ____________________________________________   First MD Initiated Contact with Patient 08/13/18 1843     (approximate)  I have reviewed the triage vital signs and the nursing notes.   HISTORY  Chief Complaint Voluntary    HPI Corey [name] is a 33 y.o. male patient reports he was diagnosed with schizoaffective disorder.  He is on medicines but lately he has been hearing voices telling him to hurt himself.  These voices been getting louder.  Today he almost listen to him and then called for help.  That is why he is here.  Past Medical History:  Diagnosis Date  . Bipolar 1 disorder (HCC)   . GERD (gastroesophageal reflux disease)   . HTN (hypertension)   . Schizoaffective disorder (HCC)   . 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 Psychosis (HCC) 06/29/2018  . Bipolar 1 disorder most recent episode depressed with psychotic features 05/04/2018  . Syncope and collapse 04/29/2018  . Hypotension 04/29/2018  . Bradycardia 04/24/2018  . 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 Kidney stone 03/26/2018    Past Surgical History:  Procedure Laterality Date  . ESOPHAGOGASTRODUODENOSCOPY (EGD) WITH PROPOFOL N/A 04/26/2018   Procedure: ESOPHAGOGASTRODUODENOSCOPY (EGD) WITH PROPOFOL;  Surgeon: Wyline Mood, MD;  Location: [facility] ENDOSCOPY;  Service: Gastroenterology;  Laterality: N/A;  . KNEE ARTHROSCOPY    . PACEMAKER INSERTION Left 05/15/2018   Procedure: INSERTION PACEMAKER;  Surgeon: Marcina Millard, MD;  Location: [facility] ORS;  Service: Cardiovascular;  Laterality: Left;    Prior to Admission medications   Medication Sig Start Date End Date Taking? Authorizing Provider  ALPRAZolam (XANAX XR) 2 MG 24 hr tablet Take 2 mg by mouth every morning.    [provider]  ALPRAZolam Prudy Feeler) 1 MG tablet Take 1 mg by  mouth 2 (two) times daily as needed for anxiety.    [provider]  divalproex (DEPAKOTE ER) 500 MG 24 hr tablet Take 2 tablets (1,000 mg total) by mouth at bedtime. Patient taking differently: Take 1,500 mg by mouth at bedtime.  07/04/18   Hessie Knows, MD  FLUoxetine (PROZAC) 40 MG capsule Take 1 capsule (40 mg total) by mouth daily. Patient taking differently: Take 60 mg by mouth daily.  07/04/18   Hessie Knows, MD  nitroGLYCERIN (NITROSTAT) 0.4 MG SL tablet Place 1 tablet (0.4 mg total) under the tongue every 5 (five) minutes as needed for chest pain. 05/28/18 05/28/19  Nita Sickle, MD  pantoprazole (PROTONIX) 40 MG tablet Take 1 tablet (40 mg total) by mouth daily. 07/04/18 09/02/18  Hessie Knows, MD  risperiDONE (RISPERDAL) 3 MG tablet Take 1 tablet (3 mg total) by mouth at bedtime. Patient taking differently: Take 1.5 mg by mouth at bedtime.  07/04/18   Hessie Knows, MD  sucralfate (CARAFATE) 1 g tablet Take 1 tablet (1 g total) by mouth 4 (four) times daily. 04/01/18   Sharman Cheek, MD    Allergies Penicillins and Sulfa antibiotics  Family History  Problem Relation Age of Onset  . Kidney cancer Neg Hx   . Bladder Cancer Neg Hx   . Prostate cancer Neg Hx     Social History Social History   Tobacco Use  . Smoking status: Former Games developer  . Smokeless tobacco: Current User    Types: Snuff  Substance Use Topics  . Alcohol use: No  . Drug use: Not  Currently    Types: Marijuana    Review of Systems  Constitutional: No fever/chills Eyes: No visual changes. ENT: No sore throat. Cardiovascular: Denies chest pain. Respiratory: Denies shortness of breath. Gastrointestinal: No abdominal pain.  No nausea, no vomiting.  No diarrhea.  No constipation. Genitourinary: Negative for dysuria. Musculoskeletal: Negative for back pain. Skin: Negative for rash. Neurological: Negative for headaches, focal weakness   ____________________________________________   PHYSICAL EXAM:  VITAL SIGNS: ED Triage Vitals [08/13/18 1821]  Enc Vitals Group     BP 105/67     Pulse Rate 91     Resp 18     Temp 98.2 F (36.8 C)     Temp src      SpO2 97 %     Weight 270 lb (122.5 kg)     Height 5\' 11"  (1.803 m)     Head Circumference      Peak Flow      Pain Score 0     Pain Loc      Pain Edu?      Excl. in GC?     Constitutional: Alert and oriented. Well appearing and in no acute distress. Eyes: Conjunctivae are normal.  Head: Atraumatic. Nose: No congestion/rhinnorhea. Mouth/Throat: Mucous membranes are moist.  Oropharynx non-erythematous. Neck: No stridor.  Cardiovascular: Normal rate, regular rhythm. Grossly normal heart sounds.  Good peripheral circulation. Respiratory: Normal respiratory effort.  No retractions. Lungs CTAB. Gastrointestinal: Soft and nontender. No distention. No abdominal bruits. No CVA tenderness. Musculoskeletal: No lower extremity tenderness nor edema.   Neurologic:  Normal speech and language. No gross focal neurologic deficits are appreciated.  Skin:  Skin is warm, dry and intact. No rash noted.   ____________________________________________   LABS (all labs ordered are listed, but only abnormal results are displayed)  Labs Reviewed  CBC  COMPREHENSIVE METABOLIC PANEL  ETHANOL  SALICYLATE LEVEL  ACETAMINOPHEN LEVEL  URINE DRUG SCREEN, QUALITATIVE ([facility] ONLY)  VALPROIC ACID LEVEL   ____________________________________________  EKG   ____________________________________________  RADIOLOGY  ED MD interpretation:   Official radiology report(s): No results found.  ____________________________________________   PROCEDURES  Procedure(s) performed:   Procedures  Critical Care performed:   ____________________________________________   INITIAL IMPRESSION / ASSESSMENT AND PLAN / ED COURSE         ____________________________________________   FINAL CLINICAL IMPRESSION(S) / ED DIAGNOSES  Final diagnoses:  Psychosis, unspecified psychosis type (HCC)  Hallucinations     ED Discharge Orders    None       Note:  This document was prepared using Dragon voice recognition software and may include unintentional dictation errors.    Arnaldo NatalMalinda,  F, MD 08/13/18 [phone]

## 2018-08-13 NOTE — ED Notes (Signed)
Pt dressed out. Pt belongings bag:  1 orange shirt 1 pair of blue jeans 1 belt 1 nose ring 1 lip ring 1 silver ring 3 braclets 1 pair of brown boots 1 pair of socks

## 2018-08-13 NOTE — ED Triage Notes (Addendum)
Pt comes via POV from home with c/o hearing voices. Pt states he was dx with schizo about a month ago and was prescribed medications. Pt states he has been taking his medications. Pt states today he started hearing the voices and they are telling him that he should just kill himself and that no one cares.  Pt states SI and HI. Pt denies any plan at this time.  Pt calm and cooperative.

## 2018-08-13 NOTE — ED Notes (Signed)
Pharm tech verify meds att

## 2018-08-13 NOTE — ED Notes (Signed)
As per instructions from pt: Corey [name], mother ro pt, called att and updated with pt status, [phone]  Mother wanted to ensure pt's voices were instructing pt to "kill hisself"

## 2018-08-14 ENCOUNTER — Inpatient Hospital Stay
Admission: AD | Admit: 2018-08-14 | Discharge: 2018-08-16 | DRG: 885 | Disposition: A | Discharge status: Home / Self Care | Payer: BLUE CROSS/BLUE SHIELD | Attending: Psychiatry | Admitting: Psychiatry

## 2018-08-14 DIAGNOSIS — I10 Essential (primary) hypertension: Secondary | ICD-10-CM | POA: Diagnosis present

## 2018-08-14 DIAGNOSIS — F315 Bipolar disorder, current episode depressed, severe, with psychotic features: Secondary | ICD-10-CM | POA: Diagnosis present

## 2018-08-14 DIAGNOSIS — Z915 Personal history of self-harm: Secondary | ICD-10-CM

## 2018-08-14 DIAGNOSIS — Z950 Presence of cardiac pacemaker: Secondary | ICD-10-CM | POA: Diagnosis not present

## 2018-08-14 DIAGNOSIS — F1220 Cannabis dependence, uncomplicated: Secondary | ICD-10-CM | POA: Diagnosis present

## 2018-08-14 DIAGNOSIS — K219 Gastro-esophageal reflux disease without esophagitis: Secondary | ICD-10-CM | POA: Diagnosis present

## 2018-08-14 DIAGNOSIS — R45851 Suicidal ideations: Secondary | ICD-10-CM | POA: Diagnosis present

## 2018-08-14 DIAGNOSIS — Z79899 Other long term (current) drug therapy: Secondary | ICD-10-CM | POA: Diagnosis not present

## 2018-08-14 DIAGNOSIS — R45850 Homicidal ideations: Secondary | ICD-10-CM | POA: Diagnosis present

## 2018-08-14 DIAGNOSIS — F319 Bipolar disorder, unspecified: Secondary | ICD-10-CM

## 2018-08-14 DIAGNOSIS — R001 Bradycardia, unspecified: Secondary | ICD-10-CM | POA: Diagnosis present

## 2018-08-14 LAB — HEMOGLOBIN A1C
Hgb A1c MFr Bld: 5.2 % (ref 4.8–5.6)
Mean Plasma Glucose: 102.54 mg/dL

## 2018-08-14 LAB — LIPID PANEL
Cholesterol: 167 mg/dL (ref 0–200)
HDL: 35 mg/dL — ABNORMAL LOW (ref >40)
LDL Cholesterol: 76 mg/dL (ref 0–99)
Total CHOL/HDL Ratio: 4.8 RATIO
Triglycerides: 281 mg/dL — ABNORMAL HIGH (ref <150)
VLDL: 56 mg/dL — ABNORMAL HIGH (ref 0–40)

## 2018-08-14 MED ORDER — RISPERIDONE 1 MG PO TABS: 1.0000 mg | ORAL_TABLET | Freq: Every day | ORAL | Status: DC

## 2018-08-14 MED ORDER — ACETAMINOPHEN 325 MG PO TABS: 650.0000 mg | ORAL_TABLET | Freq: Four times a day (QID) | ORAL | Status: DC | PRN

## 2018-08-14 MED ORDER — BREXPIPRAZOLE 1 MG PO TABS
3.0000 mg | ORAL_TABLET | Freq: Every day | ORAL | Status: DC
  Administered 2018-08-14 – 2018-08-15 (×2): 3 mg via ORAL
  Filled 2018-08-14 (×2): qty 3

## 2018-08-14 MED ORDER — HYDROXYZINE HCL 25 MG PO TABS
25.0000 mg | ORAL_TABLET | ORAL | Status: DC | PRN
  Administered 2018-08-14 – 2018-08-15 (×2): 25 mg via ORAL
  Filled 2018-08-14 (×2): qty 1

## 2018-08-14 MED ORDER — SUCRALFATE 1 G PO TABS
1.0000 g | ORAL_TABLET | Freq: Four times a day (QID) | ORAL | Status: DC
  Administered 2018-08-14 – 2018-08-15 (×6): 1 g via ORAL
  Filled 2018-08-14 (×12): qty 1

## 2018-08-14 MED ORDER — FLUOXETINE HCL 20 MG PO CAPS: 60.0000 mg | ORAL_CAPSULE | Freq: Every day | ORAL | Status: DC

## 2018-08-14 MED ORDER — TRAZODONE HCL 100 MG PO TABS
100.0000 mg | ORAL_TABLET | Freq: Every evening | ORAL | Status: DC | PRN
  Administered 2018-08-14 – 2018-08-15 (×2): 100 mg via ORAL
  Filled 2018-08-14 (×2): qty 1

## 2018-08-14 MED ORDER — MAGNESIUM HYDROXIDE 400 MG/5ML PO SUSP: 30.0000 mL | Freq: Every day | ORAL | Status: DC | PRN

## 2018-08-14 MED ORDER — FLUOXETINE HCL 20 MG PO CAPS
60.0000 mg | ORAL_CAPSULE | Freq: Every day | ORAL | Status: DC
  Administered 2018-08-15 – 2018-08-16 (×2): 60 mg via ORAL
  Filled 2018-08-14 (×2): qty 3

## 2018-08-14 MED ORDER — ALUM & MAG HYDROXIDE-SIMETH 200-200-20 MG/5ML PO SUSP
30.0000 mL | ORAL | Status: DC | PRN
  Administered 2018-08-16: 30 mL via ORAL
  Filled 2018-08-14: qty 30

## 2018-08-14 MED ORDER — DIVALPROEX SODIUM ER 500 MG PO TB24: 1500.0000 mg | ORAL_TABLET | Freq: Every day | ORAL | Status: DC

## 2018-08-14 MED ORDER — DIVALPROEX SODIUM ER 500 MG PO TB24
1500.0000 mg | ORAL_TABLET | Freq: Every day | ORAL | Status: AC
  Administered 2018-08-14: 1500 mg via ORAL
  Filled 2018-08-14: qty 3

## 2018-08-14 MED ORDER — NITROGLYCERIN 0.4 MG SL SUBL: 0.4000 mg | SUBLINGUAL_TABLET | SUBLINGUAL | Status: DC | PRN

## 2018-08-14 MED ORDER — PANTOPRAZOLE SODIUM 40 MG PO TBEC
40.0000 mg | DELAYED_RELEASE_TABLET | Freq: Every day | ORAL | Status: DC
  Administered 2018-08-15 – 2018-08-16 (×2): 40 mg via ORAL
  Filled 2018-08-14 (×2): qty 1

## 2018-08-14 MED ORDER — DIVALPROEX SODIUM 500 MG PO DR TAB
500.0000 mg | DELAYED_RELEASE_TABLET | Freq: Three times a day (TID) | ORAL | Status: DC
  Administered 2018-08-15 – 2018-08-16 (×4): 500 mg via ORAL
  Filled 2018-08-14 (×4): qty 1

## 2018-08-14 MED ORDER — BREXPIPRAZOLE 3 MG PO TABS: 3.0000 mg | ORAL_TABLET | Freq: Every day | ORAL | Status: DC

## 2018-08-14 NOTE — Progress Notes (Signed)
Admission  From ER  Report from Amy T. RN  33 year old white male  in under the service of Dr. Jennet Maduro   Pt appeared depressed  With  a flat affect.  Pt  denies SI / AVH at this time. Prior to coming to unit in ER patient stated he was suicidal.  Pt is redirectable and cooperative with assessment. Patient smokes marijuana weekly  Patient goes to the [facility] for treatment  Recently diagnosis  With Schizoaffective disorder .  Patient IVC by the ER physician  Patient was in this facility in November  History of self injurious behavior.  A: Pt admitted to unit per protocol, skin assessment and search done and no contraband found.MHT Reggie   Pt  educated on therapeutic milieu rules. Pt was introduced to milieu by nursing staff.    R: Pt was receptive to education about the milieu .  15 min safety checks started. Clinical research associate offered support

## 2018-08-14 NOTE — ED Notes (Signed)
BEHAVIORAL HEALTH ROUNDING  Patient sleeping: No.  Patient alert and oriented: yes  Behavior appropriate: Yes. ; If no, describe:  Nutrition and fluids offered: Yes  Toileting and hygiene offered: Yes  Sitter present: not applicable, Q 15 min safety rounds and observation via security camera. Law enforcement present: Yes ODS  

## 2018-08-14 NOTE — ED Notes (Signed)
BEHAVIORAL HEALTH ROUNDING Patient sleeping: Yes.   Patient alert and oriented: not applicable SLEEPING Behavior appropriate: Yes.  ; If no, describe: SLEEPING Nutrition and fluids offered: No SLEEPING Toileting and hygiene offered: NoSLEEPING Sitter present: not applicable, Q 15 min safety rounds and observation via security camera. Law enforcement present: Yes ODS 

## 2018-08-14 NOTE — ED Notes (Signed)
BEHAVIORAL HEALTH ROUNDING Patient sleeping: No. Patient alert and oriented: yes Behavior appropriate: Yes.  ; If no, describe:  Nutrition and fluids offered: yes Toileting and hygiene offered: Yes  Sitter present: q15 minute observations and security  monitoring Law enforcement present: Yes  ODS  

## 2018-08-14 NOTE — ED Notes (Signed)
BEHAVIORAL HEALTH ROUNDING Patient sleeping: No. Patient alert and oriented: yes Behavior appropriate: Yes.  ; If no, describe:  Nutrition and fluids offered: yes Toileting and hygiene offered: Yes  Sitter present: q15 minute observations and security camera monitoring Law enforcement present: Yes  ODS  

## 2018-08-14 NOTE — ED Notes (Signed)
He has ambulated to and from the BR with a steady gait  - NAD observed  Continue to monitor   

## 2018-08-14 NOTE — ED Notes (Signed)
IVC/  PENDING  PLACEMENT 

## 2018-08-14 NOTE — [facility] Group Notes (Signed)
[facility] Group Notes:  (Nursing/MHT/Case Management/Adjunct)  Date:  08/14/2018  Time:  10:07 PM  Type of Therapy:  Group Therapy  Participation Level:  Active  Participation Quality:  Appropriate  Affect:  Appropriate  Cognitive:  Alert  Insight:  Good  Engagement in Group:  Engaged  Modes of Intervention:  Support  Summary of Progress/Problems:  Corey [name] [name] 08/14/2018, 10:07 PM 

## 2018-08-14 NOTE — ED Provider Notes (Signed)
-----------------------------------------   4:43 AM on 08/14/2018 -----------------------------------------   Blood pressure 113/78, pulse 62, temperature 97.8 F (36.6 C), temperature source Oral, resp. rate 18, height 1.803 m (5\' 11" ), weight 122.5 kg, SpO2 98 %.  The patient is asleep at this time.  I reviewed the tele-psych evaluation who recommended inpatient treatment on the behavioral medicine service.  I added medications as recommended in the report as well.  Upholding IVC.    Loleta Rose, MD 08/14/18 [phone]

## 2018-08-14 NOTE — Progress Notes (Signed)
[facility] MD Progress Note  08/14/2018 10:05 AM Corey [name]  MRN:  [id number] Subjective: "I have been hearing voices telling me nobody likes you, go ahead and ended." Principal Problem: Bipolar 1 disorder (HCC) Diagnosis: Principal Problem:   Bipolar 1 disorder most recent episode depressed with psychotic features  Total Time spent with patient: 35 minutes  Past Psychiatric History: Corey [name] is a 33 y.o. male with a history of bipolar disorder with psychotic features when depressed.  Patient reports that over the past month he has been having worsening voices of a derogatory nature.  Patient has been having command auditory hallucinations to kill himself and to harm others.  He reports it has been "a while" until he has had voices to hurt others.  He has felt like acting upon his thoughts to hurt himself.  He denies visual hallucinations, however reports that he has been feeling paranoid and often looking over his shoulders.  Patient describes recent stressor of his ex-wife moving to [place] with their children, 95 years.  His current wife and 42-month-old son live on the [place].  Patient states that since he received his pacemaker on May 15, 2018 he has not been able to move.  He is uncertain if he will move to that area patient is unemployed.  He has been living alone, and states his grandfather lives up the road as support.  Patient states he is followed at [facility] for psychiatry by Shanda Bumps and Veatrice Kells as prescriber.  He states he is on 3 mg of Rexulti, 1500 mg of Depakote, 60 mg of Prozac and 1 mg of Klonopin twice daily as well as Carafate and Protonix.  He states that he recently was added something for sleep, but does not know if that has been effective.  Patient states that he has been taking medication as prescribed (however Depakote level reveals Depakote of less than 10).  Patient denies current suicidal homicidal ideation.  He continues to have auditory  hallucinations but denies visual hallucinations at this time.  Patient does have a history of suicide attempt, and history of past psychiatric admissions. Patient denies access to weapons.   Past Medical History:  Past Medical History:  Diagnosis Date  . Bipolar 1 disorder (HCC)   . GERD (gastroesophageal reflux disease)   . HTN (hypertension)   . Schizoaffective disorder (HCC)   . 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 ESOPHAGOGASTRODUODENOSCOPY (EGD) WITH PROPOFOL N/A 04/26/2018   Procedure: ESOPHAGOGASTRODUODENOSCOPY (EGD) WITH PROPOFOL;  Surgeon: Wyline Mood, MD;  Location: [facility] ENDOSCOPY;  Service: Gastroenterology;  Laterality: N/A;  . KNEE ARTHROSCOPY    . PACEMAKER INSERTION Left 05/15/2018   Procedure: INSERTION PACEMAKER;  Surgeon: Marcina Millard, MD;  Location: [facility] ORS;  Service: Cardiovascular;  Laterality: Left;   Family History:  Family History  Problem Relation Age of Onset  . Kidney cancer Neg Hx   . Bladder Cancer Neg Hx   . Prostate cancer Neg Hx    Family Psychiatric  History: Not stated Social History:  Social History   Substance and Sexual Activity  Alcohol Use No     Social History   Substance and Sexual Activity  Drug Use Not Currently  . Types: Marijuana    Social History   Socioeconomic History  . Marital status: Legally Separated    Spouse name: Not on file  . Number of children:  3  . Years of education: Not on file  . Highest education level: Not on file  Occupational History  . Not on file  Social Needs  . Financial resource strain: Not hard at all  . Food insecurity:    Worry: Never true    Inability: Never true  . Transportation needs:    Medical: No    Non-medical: No  Tobacco Use  . Smoking status: Former Games developer  . Smokeless tobacco: Current User    Types: Snuff  Substance and Sexual Activity  . Alcohol use: No  . Drug  use: Not Currently    Types: Marijuana  . Sexual activity: Not Currently  Lifestyle  . Physical activity:    Days per week: 7 days    Minutes per session: 60 min  . Stress: Not at all  Relationships  . Social connections:    Talks on phone: More than three times a week    Gets together: More than three times a week    Attends religious service: Never    Active member of club or organization: No    Attends meetings of clubs or organizations: Never    Relationship status: Separated  Other Topics Concern  . Not on file  Social History Narrative  . 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            Sleep: Poor  Appetite:  Fair  Current Medications: Current Facility-Administered Medications  Medication Dose Route Frequency Provider Last Rate Last Dose  . ALPRAZolam (XANAX XR) 24 hr tablet 2 mg  2 mg Oral BH-q7a Arnaldo Natal, MD      . ALPRAZolam Prudy Feeler) tablet 1 mg  1 mg Oral BID PRN Arnaldo Natal, MD      . divalproex (DEPAKOTE ER) 24 hr tablet 1,500 mg  1,500 mg Oral QHS Arnaldo Natal, MD   1,500 mg at 08/14/18 0004  . FLUoxetine (PROZAC) capsule 60 mg  60 mg Oral Daily Loleta Rose, MD      . nitroGLYCERIN (NITROSTAT) SL tablet 0.4 mg  0.4 mg Sublingual Q5 min PRN Arnaldo Natal, MD      . pantoprazole (PROTONIX) EC tablet 40 mg  40 mg Oral Daily Arnaldo Natal, MD      . risperiDONE (RISPERDAL) tablet 1 mg  1 mg Oral QHS Loleta Rose, MD      . sucralfate (CARAFATE) tablet 1 g  1 g Oral QID Arnaldo Natal, MD   1 g at 08/14/18 0029  . zolpidem (AMBIEN) tablet 5 mg  5 mg Oral QHS PRN,MR X 1 Arnaldo Natal, MD    5 mg at 08/14/18 6387   Current Outpatient Medications  Medication Sig Dispense Refill  . Brexpiprazole (REXULTI) 3 MG TABS Take 3 mg by mouth at bedtime.    . divalproex (DEPAKOTE ER) 500 MG 24 hr tablet Take 2 tablets (1,000 mg total) by mouth at bedtime. (Patient taking differently: Take 1,500 mg by mouth at bedtime. ) 60 tablet 0  . Eszopiclone (ESZOPICLONE)  3 MG TABS Take 3 mg by mouth at bedtime. Take immediately before bedtime    . ALPRAZolam (XANAX XR) 2 MG 24 hr tablet Take 2 mg by mouth every morning.    Marland Kitchen ALPRAZolam (XANAX) 1 MG tablet Take 1 mg by mouth 2 (two) times daily as needed for anxiety.    Marland Kitchen FLUoxetine (PROZAC) 40 MG capsule Take 1 capsule (40 mg total) by mouth daily. (Patient taking differently: Take 60 mg by mouth daily. ) 30 capsule 0  . nitroGLYCERIN (NITROSTAT) 0.4 MG SL tablet Place 1 tablet (0.4 mg total) under the tongue every 5 (five) minutes as needed for chest pain. 10 tablet 0  . pantoprazole (PROTONIX) 40 MG tablet Take 1 tablet (40 mg total) by mouth daily. 30 tablet 1  . risperiDONE (RISPERDAL) 3 MG tablet Take 1 tablet (3 mg total) by mouth at bedtime. (Patient taking differently: Take 1.5 mg by mouth at bedtime. ) 30 tablet 0  . sucralfate (CARAFATE) 1 g tablet Take 1 tablet (1 g total) by mouth 4 (four) times daily. 60 tablet 0    Lab Results:  Results for orders placed or performed during the hospital encounter of 08/13/18 (from the past 48 hour(s))  Comprehensive metabolic panel     Status: Abnormal   Collection Time: 08/13/18  6:25 PM  Result Value Ref Range   Sodium 142 135 - 145 mmol/L   Potassium 3.8 3.5 - 5.1 mmol/L   Chloride 111 98 - 111 mmol/L   CO2 22 22 - 32 mmol/L   Glucose, Bld 145 (H) 70 - 99 mg/dL   BUN 15 6 - 20 mg/dL   Creatinine, Ser 4.09 0.61 - 1.24 mg/dL   Calcium 8.5 (L) 8.9 - 10.3 mg/dL   Total Protein 7.1 6.5 - 8.1 g/dL   Albumin 4.0 3.5 - 5.0 g/dL   AST 26 15 - 41 U/L   ALT 22 0 - 44 U/L   Alkaline Phosphatase 84 38 -  126 U/L   Total Bilirubin 0.3 0.3 - 1.2 mg/dL   GFR calc non Af Amer >60 >60 mL/min   GFR calc Af Amer >60 >60 mL/min   Anion gap 9 5 - 15    Comment: Performed at [facility], [address] [place], [place] [address]  Ethanol     Status: None   Collection Time: 08/13/18  6:25 PM  Result Value Ref Range   Alcohol, Ethyl (B) <10 <10 mg/dL    Comment: (NOTE) Lowest detectable limit for serum alcohol is 10 mg/dL. For medical purposes only. Performed at [facility], [address] [place], [place] [address]   Salicylate level     Status: None   Collection Time: 08/13/18  6:25 PM  Result Value Ref Range   Salicylate Lvl <7.0 2.8 - 30.0 mg/dL    Comment: Performed at [facility], [address] [place], [place] [address]  Acetaminophen level     Status: Abnormal   Collection Time: 08/13/18  6:25 PM  Result Value Ref Range   Acetaminophen (Tylenol), Serum <10 (L) 10 - 30 ug/mL    Comment: (NOTE) Therapeutic concentrations vary significantly. A range of 10-30 ug/mL  may be an effective concentration for many patients. However, some  are best treated at concentrations outside of this range. Acetaminophen concentrations >150 ug/mL at 4 hours after ingestion  and >50 ug/mL at 12 hours after ingestion are often associated with  toxic reactions. Performed at [facility], [address] [place], [place] [address]   cbc     Status: None   Collection Time: 08/13/18  6:25 PM  Result Value Ref Range   WBC 8.6 4.0 - 10.5 K/uL   RBC 5.04 4.22 - 5.81 MIL/uL   Hemoglobin 14.7 13.0 - 17.0 g/dL   HCT 60.444.8 54.039.0 - 98.152.0 %   MCV 88.9 80.0 - 100.0 fL   MCH 29.2 26.0 - 34.0 pg   MCHC 32.8 30.0 - 36.0 g/dL   RDW 19.112.6 47.811.5 - 29.515.5 %   Platelets 273 150 - 400 K/uL   nRBC 0.0 0.0 - 0.2 %    Comment: Performed at [facility], [address] [place], [place] [address]  Valproic acid level     Status: Abnormal   Collection Time: 08/13/18  6:25  PM  Result Value Ref Range   Valproic Acid Lvl <10 (L) 50.0 - 100.0 ug/mL    Comment: RESULT CONFIRMED BY MANUAL DILUTION AKT Performed at [facility], [address] [place], [place] [address]   Urine Drug Screen, Qualitative     Status: Abnormal   Collection Time: 08/13/18 10:44 PM  Result Value Ref Range   Tricyclic, Ur Screen POSITIVE (A) NONE DETECTED   Amphetamines, Ur Screen NONE DETECTED NONE DETECTED   MDMA (Ecstasy)Ur Screen NONE DETECTED NONE DETECTED   Cocaine Metabolite,Ur Kirby NONE DETECTED NONE DETECTED   Opiate, Ur Screen NONE DETECTED NONE DETECTED   Phencyclidine (PCP) Ur S NONE DETECTED NONE DETECTED   Cannabinoid 50 Ng, Ur Crocker POSITIVE (A) NONE DETECTED   Barbiturates, Ur Screen NONE DETECTED NONE DETECTED   Benzodiazepine, Ur Scrn POSITIVE (A) NONE DETECTED   Methadone Scn, Ur NONE DETECTED NONE DETECTED    Comment: (NOTE) Tricyclics + metabolites, urine    Cutoff 1000 ng/mL Amphetamines + metabolites, urine  Cutoff 1000 ng/mL MDMA (Ecstasy), urine              Cutoff 500 ng/mL Cocaine Metabolite, urine          Cutoff 300 ng/mL Opiate + metabolites, urine        Cutoff 300 ng/mL Phencyclidine (PCP), urine         Cutoff 25 ng/mL Cannabinoid, urine                 Cutoff 50 ng/mL Barbiturates + metabolites, urine  Cutoff 200 ng/mL Benzodiazepine, urine              Cutoff 200 ng/mL Methadone, urine                   Cutoff 300 ng/mL The urine drug screen provides only a preliminary, unconfirmed analytical test result and should not be used for non-medical purposes. Clinical consideration and professional judgment should be applied to any positive drug screen result due to possible interfering substances. A more specific alternate chemical method must be used in order to obtain a confirmed analytical result. Gas chromatography / mass spectrometry (GC/MS) is the preferred confirmat ory method. Performed at [facility], [address] [place], [place] [address]     Blood Alcohol level:  Lab Results  Component Value Date   [facility] 08/13/2018   ETH <10 07/15/2018    Metabolic Disorder Labs: No results found for: HGBA1C, MPG No results found for: PROLACTIN No results found for: CHOL, TRIG, HDL, CHOLHDL, VLDL, LDLCALC  Physical Findings: AIMS:  , ,  ,  ,  CIWA:    COWS:     Musculoskeletal: Strength & Muscle Tone: within normal limits Gait & Station: normal Patient leans: N/A  Psychiatric Specialty Exam: Physical Exam  Constitutional: He appears well-developed and well-nourished. No distress.  HENT:  Head: Normocephalic and atraumatic.  Cardiovascular: Normal rate.  Respiratory: Effort normal.  Musculoskeletal: Normal range of motion.  Neurological: He is alert.    Review of Systems  Constitutional: Negative.   Cardiovascular: Negative for chest pain and palpitations.       Has a pacemaker placed on 05/15/2018  Gastrointestinal: Positive for heartburn.  Musculoskeletal: Negative.   Psychiatric/Behavioral: Positive for depression and suicidal ideas.    Blood pressure 113/78, pulse 62, temperature 97.8 F (36.6 C), temperature source Oral, resp. rate 18, height 5\' 11"  (1.803 m), weight 122.5 kg, SpO2 98 %.Body mass index is 37.66 kg/m.  General Appearance: Casual and Long beard  Eye Contact:  Fair  Speech:  Garbled  Volume:  Normal  Mood:  Depressed  Affect:  Congruent  Thought Process:  Coherent and Descriptions of Associations: Intact  Orientation:  Full (Time, Place, and Person)  Thought Content:  Logical, Hallucinations: Auditory Command:  To end his life. and Paranoid Ideation  Suicidal Thoughts:  Yes.  without intent/plan  Homicidal Thoughts:  No  Memory:  Immediate;   Fair Recent;   Fair Remote;   Fair  Judgement:  Fair  Insight:  Fair  Psychomotor Activity:  Normal  Concentration:  Concentration: Fair and Attention Span: Fair  Recall:  FiservFair  Fund of Knowledge:  Fair   Language:  Fair  Akathisia:  No  Handed:  Right  AIMS (if indicated):   0  Assets:  Communication Skills Desire for Improvement Housing Social Support  ADL's:  Intact  Cognition:  WNL  Sleep:   Poor, patient has been medication noncompliant    Labs reviewed: UDS positive for TCAs, cannabinoids and benzodiazepines CMP with elevated blood sugar, will add hemoglobin A1c to current labs. Blood alcohol, salicylate and acetaminophen levels negative CBC within normal limits Valproic acid level less than 10, 1 month ago was therapeutic at 5974.  TSH November 2019 was within normal limits. Lipid level not completed, will add on to current labs.  Patient does not have current EKG on file, will order to be completed on unit.    Treatment Plan Summary: Medication management and Plan Restart home medications.  Admit to inpatient psychiatry.  Mariel CraftSHEILA M Aanika Defoor, MD 08/14/2018, 10:05 AM   Note:  This document was prepared using Dragon voice recognition software and may include unintentional dictation errors.

## 2018-08-14 NOTE — ED Provider Notes (Signed)
-----------------------------------------   10:18 AM on 08/14/2018 -----------------------------------------  Patient has been seen by psychiatry.  They will be admitting to their service once a bed becomes available.   Minna Antis, MD 08/14/18 1019

## 2018-08-14 NOTE — Plan of Care (Signed)
  Problem: Education: Goal: Knowledge of [facility] Education information/materials will improve Note:  Instructed on unit programing  hand washing and attendance , verbalize understanding .

## 2018-08-14 NOTE — Tx Team (Signed)
Initial Treatment Plan 08/14/2018 3:29 PM [name] PHK:[id number]    PATIENT STRESSORS: Health problems Medication change or noncompliance Substance abuse   PATIENT STRENGTHS: Family Support Insight into problems  PATIENT IDENTIFIED PROBLEMS: Command Thoughts  08/14/2018  Girlfriend  Issues  08/14/2018  Substance Abuse  08/14/2018                 DISCHARGE CRITERIA:  Ability to meet basic life and health needs Improved stabilization in mood, thinking, and/or behavior  PRELIMINARY DISCHARGE PLAN: Outpatient therapy Return to previous living arrangement  PATIENT/FAMILY INVOLVEMENT: This treatment plan has been presented to and reviewed with the patient, Corey [name], and/or family member,  .  The patient and family have been given the opportunity to ask questions and make suggestions.  Crist Infante, RN 08/14/2018, 3:29 PM

## 2018-08-14 NOTE — BHH Group Notes (Signed)
LCSW Group Therapy Note   08/14/2018 1:00 PM  Type of Therapy and Topic:  Group Therapy:  Overcoming Obstacles   Participation Level:  Did Not Attend   Description of Group:    In this group patients will be encouraged to explore what they see as obstacles to their own wellness and recovery. They will be guided to discuss their thoughts, feelings, and behaviors related to these obstacles. The group will process together ways to cope with barriers, with attention given to specific choices patients can make. Each patient will be challenged to identify changes they are motivated to make in order to overcome their obstacles. This group will be process-oriented, with patients participating in exploration of their own experiences as well as giving and receiving support and challenge from other group members.   Therapeutic Goals: 1. Patient will identify personal and current obstacles as they relate to admission. 2. Patient will identify barriers that currently interfere with their wellness or overcoming obstacles.  3. Patient will identify feelings, thought process and behaviors related to these barriers. 4. Patient will identify two changes they are willing to make to overcome these obstacles:      Summary of Patient Progress       Therapeutic Modalities:   Cognitive Behavioral Therapy Solution Focused Therapy Motivational Interviewing Relapse Prevention Therapy  Penni HomansMichaela Cordia Miklos, MSW, LCSW 08/14/2018 3:59 PM

## 2018-08-14 NOTE — ED Notes (Signed)
ED  Is the patient under IVC or is there intent for IVC: Yes.   Is the patient medically cleared: Yes.   Is there vacancy in the ED BHU: Yes.   Is the population mix appropriate for patient: Yes.   Is the patient awaiting placement in inpatient or outpatient setting: Yes.   Has the patient had a psychiatric consult: Yes.   Survey of unit performed for contraband, proper placement and condition of furniture, tampering with fixtures in bathroom, shower, and each patient room: Yes.  ; Findings:  APPEARANCE/BEHAVIOR Calm and cooperative NEURO ASSESSMENT Orientation: oriented x3  Denies pain Hallucinations:  Denies at present  Speech: Normal Gait: normal RESPIRATORY ASSESSMENT Even  Unlabored respirations  CARDIOVASCULAR ASSESSMENT Pulses equal   regular rate  Skin warm and dry   GASTROINTESTINAL ASSESSMENT no GI complaint EXTREMITIES Full ROM  PLAN OF CARE Provide calm/safe environment. Vital signs assessed twice daily. ED BHU Assessment once each 12-hour shift. Collaborate with TTS daily or as condition indicates. Assure the ED provider has rounded once each shift. Provide and encourage hygiene. Provide redirection as needed. Assess for escalating behavior; address immediately and inform ED provider.  Assess family dynamic and appropriateness for visitation as needed: Yes.  ; If necessary, describe findings:  Educate the patient/family about BHU procedures/visitation: Yes.  ; If necessary, describe findings:

## 2018-08-14 NOTE — BH Assessment (Signed)
Assessment Note  Corey [name] is an 33 y.o. male. Who reports to the emergency department with primary complaints of auditory hallucinations and inability to maintain restful sleep. Patient shares that he was recently diagnosed with schizoaffective disorder, approximately one month ago. He states that he has been compliant with medication requests. He reports participating in outpatient treatment at [facility]. Pt shares that he is unable to sleep more than 3 or 4 hours per night. He states that on today  his auditory hallucinations have returned, they are command in nature and tell him to harm himself. He shares a history of self-injurious behavior, fast occurrence approximately 1 months ago. Patient states that he cuts to inflict pain. Patient states "I'm just trying to get the voices to calm down." He denies any plan to harm himself or others. He reports weekly marijuana use.A behavioral health assessment has been completed including evaluation of the patient, collecting collateral history:, reviewing available medical/clinic records, evaluating his unique risk and protective factors, and discussing treatment recommendations.    Diagnosis: Bipolar Disorder  Past Medical History:  Past Medical History:  Diagnosis Date  . Bipolar 1 disorder (HCC)   . GERD (gastroesophageal reflux disease)   . HTN (hypertension)   . Schizoaffective disorder (HCC)   . 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 ESOPHAGOGASTRODUODENOSCOPY (EGD) WITH PROPOFOL N/A 04/26/2018   Procedure: ESOPHAGOGASTRODUODENOSCOPY (EGD) WITH PROPOFOL;  Surgeon: Wyline Mood, MD;  Location: [facility] ENDOSCOPY;  Service: Gastroenterology;  Laterality: N/A;  . KNEE ARTHROSCOPY    . PACEMAKER INSERTION Left 05/15/2018   Procedure: INSERTION PACEMAKER;  Surgeon: Marcina Millard, MD;  Location: [facility] ORS;  Service: Cardiovascular;   Laterality: Left;    Family History:  Family History  Problem Relation Age of Onset  . Kidney cancer Neg Hx   . Bladder Cancer Neg Hx   . Prostate cancer Neg Hx     Social History:  reports that he has quit smoking. His smokeless tobacco use includes snuff. He reports previous drug use. Drug: Marijuana. He reports that he does not drink alcohol.  Additional Social History:  Alcohol / Drug Use Pain Medications: SEE MAR Prescriptions: SEE MAR Over the Counter: None reported History of alcohol / drug use?: Yes Substance #1 Name of Substance 1: Cocaine 1 - Age of First Use: uknown  1 - Amount (size/oz): none  1 - Frequency: none  1 - Duration: several years, sober 10 years now  1 - Last Use / Amount: 2009  CIWA: CIWA-Ar BP: 113/78 Pulse Rate: 62 COWS:    Allergies:  Allergies  Allergen Reactions  . Penicillins Hives    Has patient had a PCN reaction causing immediate rash, facial/tongue/throat swelling, SOB or lightheadedness with hypotension: Yes Has patient had a PCN reaction causing severe rash involving mucus membranes or skin necrosis: No Has patient had a PCN reaction that required hospitalization: No Has patient had a PCN reaction occurring within the last 10 years: No If all of the above answers are "NO", then may proceed with Cephalosporin use.   . Sulfa Antibiotics Hives    Home Medications: (Not in a hospital admission)   OB/GYN Status:  No LMP for male patient.  General Assessment Data TTS Assessment: In system Is this a Tele or Face-to-Face Assessment?: Face-to-Face Is this an Initial Assessment or a Re-assessment for this encounter?: Initial Assessment Patient Accompanied by:: N/A Language Other than  English: No Living Arrangements: Other (Comment) What gender do you identify as?: Male Marital status: Single Living Arrangements: Alone Can pt return to current living arrangement?: Yes Admission Status: Involuntary Petitioner: ED Attending Is  patient capable of signing voluntary admission?: No Referral Source: Self/Family/Friend Insurance type: BCBS  Medical Screening Exam [facility] Walk-in ONLY) Medical Exam completed: Yes  Crisis Care Plan Living Arrangements: Alone Name of Psychiatrist: [facility]  Name of Therapist: [facility]   Education Status Is patient currently in school?: No Is the patient employed, unemployed or receiving disability?: Unemployed  Risk to self with the past 6 months Suicidal Ideation: No Has patient been a risk to self within the past 6 months prior to admission? : No Suicidal Intent: No Has patient had any suicidal intent within the past 6 months prior to admission? : No Is patient at risk for suicide?: No, but patient needs Medical Clearance Suicidal Plan?: No Has patient had any suicidal plan within the past 6 months prior to admission? : No Access to Means: No What has been your use of drugs/alcohol within the last 12 months?: N Previous Attempts/Gestures: No How many times?: 0 Other Self Harm Risks: NONE Triggers for Past Attempts: None known Intentional Self Injurious Behavior: None Comment - Self Injurious Behavior: NONE Family Suicide History: No Recent stressful life event(s): Conflict (Comment), Loss (Comment) Persecutory voices/beliefs?: Yes Depression: Yes Depression Symptoms: Despondent, Loss of interest in usual pleasures Substance abuse history and/or treatment for substance abuse?: Yes Suicide prevention information given to non-admitted patients: Not applicable  Risk to Others within the past 6 months Homicidal Ideation: No Does patient have any lifetime risk of violence toward others beyond the six months prior to admission? : No Thoughts of Harm to Others: No Current Homicidal Intent: No Current Homicidal Plan: No Access to Homicidal Means: No Identified Victim: NONE History of harm to others?: No Assessment of Violence: None Noted Violent Behavior  Description: NONE Does patient have access to weapons?: No Criminal Charges Pending?: No Does patient have a court date: No Is patient on probation?: No  Psychosis Hallucinations: Auditory Delusions: None noted  Mental Status Report Appearance/Hygiene: In scrubs Eye Contact: Fair Motor Activity: Freedom of movement Speech: Slow Level of Consciousness: Alert Mood: Anxious Affect: Flat, Constricted Anxiety Level: None Thought Processes: Coherent Judgement: Impaired Orientation: Time, Situation, Place, Person Obsessive Compulsive Thoughts/Behaviors: None  Cognitive Functioning Concentration: Good Memory: Remote Intact, Recent Intact Is patient IDD: No Insight: Fair Impulse Control: Fair Appetite: Fair Have you had any weight changes? : No Change Amount of the weight change? (lbs): 0 lbs Sleep: Decreased Total Hours of Sleep: 3 Vegetative Symptoms: None  ADLScreening [facility] Assessment Services) Patient's cognitive ability adequate to safely complete daily activities?: Yes Patient able to express need for assistance with ADLs?: Yes Independently performs ADLs?: Yes (appropriate for developmental age)  Prior Inpatient Therapy Prior Inpatient Therapy: Yes Prior Therapy Dates: 06/2018 [facility] Reason for Treatment: PSYCHOSIS  Prior Outpatient Therapy Prior Outpatient Therapy: Yes Prior Therapy Dates: Current Prior Therapy Facilty/Provider(s): [facility] Reason for Treatment: Bipolar  Does patient have an ACCT team?: No Does patient have Intensive In-House Services?  : No Does patient have [facility] services? : No Does patient have P4CC services?: No  ADL Screening (condition at time of admission) Patient's cognitive ability adequate to safely complete daily activities?: Yes Patient able to express need for assistance with ADLs?: Yes Independently performs ADLs?: Yes (appropriate for developmental age)  Abuse/Neglect  Assessment (Assessment to be complete while patient is alone) Abuse/Neglect Assessment Can Be Completed: Yes Physical Abuse: Denies Verbal Abuse: Denies Sexual Abuse: Denies Exploitation of patient/patient's resources: Denies Self-Neglect: Denies Values / Beliefs Cultural Requests During Hospitalization: None Spiritual Requests During Hospitalization: None Consults Spiritual Care Consult Needed: No Social Work Consult Needed: No Merchant navy officerAdvance Directives (For Healthcare) Does Patient Have a Medical Advance Directive?: No          Disposition:  Disposition Initial Assessment Completed for this Encounter: Yes Patient referred to: Other (Comment)(Consult with Psy MD)  On Site Evaluation by:   Reviewed with Physician:    Asa SaunasShawanna N Jakyron Fabro 08/14/2018 1:18 AM

## 2018-08-14 NOTE — BH Assessment (Signed)
[facility] by Dr. Viviano SimasMaurer.  Attending Physician will be Dr. Jennet MaduroPucilowska.   Patient [facility] Lillette BoxerGwen F.   ER staff is aware of the admission:  Misty StanleyLisa, ER Secretary    Dr. Lenard LancePaduchowski, ER MD   Amy T., Patient's Nurse   Ivin BootyJoshua, Patient Access.

## 2018-08-14 NOTE — ED Notes (Signed)
BEHAVIORAL HEALTH ROUNDING  Patient sleeping: No.  Patient alert and oriented: yes  Behavior appropriate: Yes. ; If no, describe:  Nutrition and fluids offered: Yes  Toileting and hygiene offered: Yes  Sitter present: not applicable, Q 15 min safety rounds and observation via security camera. Law enforcement present: Yes ODS  ED BHU PLACEMENT JUSTIFICATION  Is the patient under IVC or is there intent for IVC: Yes.  Is the patient medically cleared: Yes.  Is there vacancy in the ED BHU: Yes.  Is the population mix appropriate for patient: Yes.  Is the patient awaiting placement in inpatient or outpatient setting: Yes.  Has the patient had a psychiatric consult: Yes.  Survey of unit performed for contraband, proper placement and condition of furniture, tampering with fixtures in bathroom, shower, and each patient room: Yes. ; Findings: All clear  APPEARANCE/BEHAVIOR  calm, cooperative and adequate rapport can be established  NEURO ASSESSMENT  Orientation: time, place and person  Hallucinations: No.None noted (Hallucinations)  Speech: Normal  Gait: normal  RESPIRATORY ASSESSMENT  WNL  CARDIOVASCULAR ASSESSMENT  WNL  GASTROINTESTINAL ASSESSMENT  WNL  EXTREMITIES  WNL  PLAN OF CARE  Provide calm/safe environment. Vital signs assessed TID. ED BHU Assessment once each 12-hour shift. Collaborate with TTS daily or as condition indicates. Assure the ED provider has rounded once each shift. Provide and encourage hygiene. Provide redirection as needed. Assess for escalating behavior; address immediately and inform ED provider.  Assess family dynamic and appropriateness for visitation as needed: Yes. ; If necessary, describe findings:  Educate the patient/family about BHU procedures/visitation: Yes. ; If necessary, describe findings: Pt is calm and cooperative at this time. Pt understanding and accepting of unit procedures/rules. Will continue to monitor with Q 15 min safety rounds and  observation via security camera.   ENVIRONMENTAL ASSESSMENT  Potentially harmful objects out of patient reach: Yes.  Personal belongings secured: Yes.  Patient dressed in hospital provided attire only: Yes.  Plastic bags out of patient reach: Yes.  Patient care equipment (cords, cables, call bells, lines, and drains) shortened, removed, or accounted for: Yes.  Equipment and supplies removed from bottom of stretcher: Yes.  Potentially toxic materials out of patient reach: Yes.  Sharps container removed or out of patient reach: Yes.   Pt brought into ED BHU via sally port and wand with metal detector for safety by ODS officer. Patient oriented to unit/care area: Pt informed of unit policies and procedures.  Informed that, for their safety, care areas are designed for safety and monitored by security cameras at all times; and visiting hours explained to patient. Patient verbalizes understanding, and verbal contract for safety obtained.Pt shown to their room.

## 2018-08-14 NOTE — ED Notes (Signed)
Patient observed lying in bed with eyes closed  Even, unlabored respirations observed   NAD pt appears to be sleeping  I will continue to monitor along with every 15 minute visual observations and ongoing security camera monitoring    

## 2018-08-14 NOTE — ED Notes (Signed)

## 2018-08-15 ENCOUNTER — Encounter: Payer: Self-pay | Admitting: Psychiatry

## 2018-08-15 DIAGNOSIS — F315 Bipolar disorder, current episode depressed, severe, with psychotic features: Principal | ICD-10-CM

## 2018-08-15 DIAGNOSIS — Z950 Presence of cardiac pacemaker: Secondary | ICD-10-CM | POA: Diagnosis present

## 2018-08-15 DIAGNOSIS — K219 Gastro-esophageal reflux disease without esophagitis: Secondary | ICD-10-CM | POA: Diagnosis present

## 2018-08-15 NOTE — [facility] Suicide Risk Assessment (Signed)
Corey [name] Hospital Admission Suicide Risk Assessment   Nursing information obtained from:  Patient Demographic [name]:  Unemployed, Living alone, Male, Caucasian Current Mental Status:  NA Loss [name]:  NA Historical [name]:  Prior suicide attempts Risk Reduction [name]:  Positive social support  Total Time spent with patient: 1 hour Principal Problem: Bipolar I disorder, current or most recent episode depressed, with psychotic features (HCC) Diagnosis:  Principal Problem:   Bipolar I disorder, current or most recent episode depressed, with psychotic features (HCC) Active Problems:   Symptomatic bradycardia   Cannabis use disorder, moderate, dependence (HCC)   GERD (gastroesophageal reflux disease)   Cardiac pacemaker in situ  Subjective Data: suicidal ideation  Continued Clinical Symptoms:  Alcohol Use Disorder Identification Test Final Score (AUDIT): 0 The "Alcohol Use Disorders Identification Test", Guidelines for Use in Primary Care, Second Edition.  Corey [name] Corey [name]). Score between 0-7:  no or low risk or alcohol related problems. Score between 8-15:  moderate risk of alcohol related problems. Score between 16-19:  high risk of alcohol related problems. Score 20 or above:  warrants further diagnostic evaluation for alcohol dependence and treatment.   CLINICAL [name]:   Bipolar Disorder:   Depressive phase Alcohol/Substance Abuse/Dependencies Currently Psychotic   Musculoskeletal: Strength & Muscle Tone: within normal limits Gait & Station: normal Patient leans: N/A  Psychiatric Specialty Exam: Physical Exam  Nursing note and vitals reviewed. Psychiatric: His affect is blunt. His speech is delayed. He is slowed, withdrawn and actively hallucinating. Cognition and memory are normal. He expresses impulsivity. He exhibits a depressed mood. He expresses homicidal and suicidal ideation.    Review of Systems  Neurological: Negative.   Psychiatric/Behavioral:  Positive for depression, hallucinations and suicidal ideas.  All other systems reviewed and are negative.   Blood pressure 114/84, pulse 69, temperature (!) 97.4 F (36.3 C), temperature source Oral, resp. rate 18, height 5\' 11"  (1.803 m), weight 126.1 kg, SpO2 100 %.Body mass index is 38.77 kg/m.  General Appearance: Casual  Eye Contact:  Good  Speech:  Clear and Coherent  Volume:  Normal  Mood:  Depressed  Affect:  Flat  Thought Process:  Goal Directed and Descriptions of Associations: Intact  Orientation:  Full (Time, Place, and Person)  Thought Content:  Hallucinations: Auditory Command:  to kill himself anf his ex-wife  Suicidal Thoughts:  Yes.  without intent/plan  Homicidal Thoughts:  Yes.  without intent/plan  Memory:  Immediate;   Fair Recent;   Fair Remote;   Fair  Judgement:  Impaired  Insight:  Shallow  Psychomotor Activity:  Decreased  Concentration:  Concentration: Fair and Attention Span: Fair  Recall:  Corey [name] of Knowledge:  Fair  Language:  Fair  Akathisia:  No  Handed:  Right  AIMS (if indicated):     Assets:  Communication Skills Desire for Improvement Financial Resources/Insurance Housing Resilience Social Support  ADL's:  Intact  Cognition:  WNL  Sleep:  Number of Hours: 6      COGNITIVE FEATURES THAT CONTRIBUTE TO RISK:  None    SUICIDE RISK:   Moderate:  Frequent suicidal ideation with limited intensity, and duration, some specificity in terms of plans, no associated intent, good self-control, limited dysphoria/symptomatology, some risk [name] present, and identifiable protective [name], including available and accessible social support.  PLAN OF CARE: hospital admission, medication management, substance abuse counseling, discharge planning.  Corey [name] ia a 33 year old male with a history of bipolar disorder admitted for auditory command hallucinations to kill in  spite of medication compliance but facing severe social  stressors.  #Suicidal and homicidal ideation -patient able to contract for safety in the hospital  #Mood/psychosis -continue Rexulti 3 mg daily -Prozac 60 mg daily -Depakote 500 mg TID -Trazodone 100 mg nightly  #GERD -Protonix 40 mg daily -Carafate QID  #Cannabis abuswe -patient minimizes problems and declines treatment  #Labs -EKG  #Disposition -discharge to home -follow up with Corey [name]   I certify that inpatient services furnished can reasonably be expected to improve the patient's condition.   Corey Linea, MD 08/15/2018, 11:23 AM

## 2018-08-15 NOTE — Progress Notes (Signed)
D: Pt denies SI/HI/AVH, can contract for safety. Pt. States when asked about hallucinations, "I haven't had any since I've been here". Pt. Affect mostly flat and constricted. Pt. Reports an anxious mood and at times seems visibly anxious. Pt is pleasant and cooperative, engages appropriately with staff and peers overall. Pt. Observed socializing with peers in the milieu frequently. No behavioral concerns to report. Pt. Denies depression.   A: Q x 15 minute observation checks were completed for safety. Patient was provided with education, but needs reinforcement. Patient was given/offered medications per orders. Patient  was encourage to attend groups, participate in unit activities and continue with plan of care. Pt. Chart and plans of care reviewed. Pt. Given support and encouragement.   R: Patient is complaint with medication and unit procedures. Pt. Given sleep aid and medication for anxiety per patient request. Pt. Attends snack time and group this evening. Pt. Eating good. Denies pain.             Precautionary checks every 15 minutes for safety maintained, room free of safety hazards, patient sustains no injury or falls during this shift. Will endorse care to next shift.

## 2018-08-15 NOTE — [facility] Group Notes (Signed)
Feelings Around Diagnosis 08/15/2018 1PM  Type of Therapy/Topic:  Group Therapy:  Feelings about Diagnosis  Participation Level:  Did Not Attend   Description of Group:   This group will allow patients to explore their thoughts and feelings about diagnoses they have received. Patients will be guided to explore their level of understanding and acceptance of these diagnoses. Facilitator will encourage patients to process their thoughts and feelings about the reactions of others to their diagnosis and will guide patients in identifying ways to discuss their diagnosis with significant others in their lives. This group will be process-oriented, with patients participating in exploration of their own experiences, giving and receiving support, and processing challenge from other group members.   Therapeutic Goals: 1. Patient will demonstrate understanding of diagnosis as evidenced by identifying two or more symptoms of the disorder 2. Patient will be able to express two feelings regarding the diagnosis 3. Patient will demonstrate their ability to communicate their needs through discussion and/or role play  Summary of Patient Progress:       Therapeutic Modalities:   Cognitive Behavioral Therapy Brief Therapy Feelings Identification    Suzan Slick, LCSW 08/15/2018 2:08 PM
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
## 2018-08-15 NOTE — Plan of Care (Signed)
Pt. Denies si/hi/avh, can contract verbally for safety. Pt. Reports he can remain safe while on the unit.    Problem: Education: Goal: Mental status will improve Outcome: Progressing   Problem: Safety: Goal: Ability to remain free from injury will improve Outcome: Progressing

## 2018-08-15 NOTE — Plan of Care (Signed)
Patient is alert and oriented X 4, denies SI, HI and AVH. Patient states, "I am fine just chilling." Patient is taking medication and appropriate on the unit with staff and peers. Patient denies pain 0/10. Patient reports sleeping and eating well, last bowel movement 08/15/2018. Patient does not appear to be responding to any internal stimulus. Patient is logical and coherent in thinking and speech. No self harmful behaviors observed. Safety checks Q 15 minutes. Problem: Education: Goal: Knowledge of [facility] Education information/materials will improve Outcome: Progressing Goal: Mental status will improve Outcome: Progressing Goal: Verbalization of understanding the information provided will improve Outcome: Progressing   Problem: Coping: Goal: Coping ability will improve Outcome: Progressing Goal: Will verbalize feelings Outcome: Progressing

## 2018-08-15 NOTE — Progress Notes (Signed)
Recreation Therapy Notes  Date:08/15/2018  Time:9:30 am  Location:Craft room  Behavioral response:N/A  Intervention Topic: Goals  Discussion/Intervention: Patient did not attend group.  Clinical Observations/Feedback:  Patient did not attend group.  Steed Kanaan LRT/CTRS        Mayerli Kirst 08/15/2018 10:46 AM

## 2018-08-15 NOTE — H&P (Signed)
Psychiatric Admission Assessment Adult  Patient Identification: Corey [name] MRN:  [id number] Date of Evaluation:  08/15/2018 Chief Complaint:  schizophrenia Principal Diagnosis: Bipolar I disorder, current or most recent episode depressed, with psychotic features (HCC) Diagnosis:  Principal Problem:   Bipolar I disorder, current or most recent episode depressed, with psychotic features (HCC) Active Problems:   Symptomatic bradycardia   Cannabis use disorder, moderate, dependence (HCC)   GERD (gastroesophageal reflux disease)   Cardiac pacemaker in situ  History of Present Illness:   Identifying data. Corey [name] is a 33 year old male with a history of bipolar disorder.  Chief complaint. "A lot of things have changed."  History of present illness. Information was obtained from the patient and the chart. The patient came to the ER complaining os voices commanding him to kill himself and his ex-wife who took their children out of the state. The voices started one week aco just after hia visit with his regular psychiatrist at the [facility]. He reports good medication compliance but his VPA level; was very low on admission. He does not report worsening of depression or heightened anxiety, just new onset of voices. He denies substance abuse but positive for cannabis.  During the interview, the patient is cool and collected, not in any distress from the voices. He roprts good sleep and appetite. He is not interested in any medication changes and would like to postpone it until his next appointment with his primary psychiatrist at the end of January. He complains of being separated from his children, ages 59 and 67 but they did visit [place] over Chrismas but the patient saw the only briefly. He has another chils with a girlfriend, who also lives out of town. They are trying to "work things out".  Past psychiatric history. Diagnosed with bipolar and schizoaffective disorder. Last hospitalization was  here in the fall. He was stabilized on Depakote, Risperdal and Prozac. Risperdal was changed to Rexulti in the community. There is a history of suicide attempt.  Family psychiatric history. None reported.  Social history. Due to symptomatic bradycardia, he had pacemaker placed. He clains to have syncopal episodes every couple of weeks still. Unable to wark. Applied for disability. Lives independently supported by his grandparents. Attends church.  Total Time spent with patient: 1 hour  Is the patient at risk to self? Yes.    Has the patient been a risk to self in the past 6 months? Yes.    Has the patient been a risk to self within the distant past? Yes.    Is the patient a risk to others? Yes.    Has the patient been a risk to others in the past 6 months? No.  Has the patient been a risk to others within the distant past? No.   Prior Inpatient Therapy:   Prior Outpatient Therapy:    Alcohol Screening: 1. How often do you have a drink containing alcohol?: Never 2. How many drinks containing alcohol do you have on a typical day when you are drinking?: 1 or 2 3. How often do you have six or more drinks on one occasion?: Never AUDIT-C Score: 0 4. How often during the last year have you found that you were not able to stop drinking once you had started?: Never 5. How often during the last year have you failed to do what was normally expected from you becasue of drinking?: Never 6. How often during the last year have you needed a first drink in the  morning to get yourself going after a heavy drinking session?: Never 7. How often during the last year have you had a feeling of guilt of remorse after drinking?: Never 8. How often during the last year have you been unable to remember what happened the night before because you had been drinking?: Never 9. Have you or someone else been injured as a result of your drinking?: No 10. Has a relative or friend or a doctor or another health worker been  concerned about your drinking or suggested you cut down?: No Alcohol Use Disorder Identification Test Final Score (AUDIT): 0 Intervention/Follow-up: AUDIT Score <7 follow-up not indicated Substance Abuse History in the last 12 months:  Yes.   Consequences of Substance Abuse: Negative Previous Psychotropic Medications: Yes  Psychological Evaluations: No  Past Medical History:  Past Medical History:  Diagnosis Date  . Bipolar 1 disorder (HCC)   . GERD (gastroesophageal reflux disease)   . HTN (hypertension)   . Schizoaffective disorder (HCC)   . 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 ESOPHAGOGASTRODUODENOSCOPY (EGD) WITH PROPOFOL N/A 04/26/2018   Procedure: ESOPHAGOGASTRODUODENOSCOPY (EGD) WITH PROPOFOL;  Surgeon: Wyline Mood, MD;  Location: [facility] ENDOSCOPY;  Service: Gastroenterology;  Laterality: N/A;  . KNEE ARTHROSCOPY    . PACEMAKER INSERTION Left 05/15/2018   Procedure: INSERTION PACEMAKER;  Surgeon: Marcina Millard, MD;  Location: [facility] ORS;  Service: Cardiovascular;  Laterality: Left;   Family History:  Family History  Problem Relation Age of Onset  . Kidney cancer Neg Hx   . Bladder Cancer Neg Hx   . Prostate cancer Neg Hx    Tobacco Screening: Have you used any form of tobacco in the last 30 days? (Cigarettes, Smokeless Tobacco, Cigars, and/or Pipes): Yes Tobacco use, Select all that apply: smokeless tobacco use daily Are you interested in Tobacco Cessation Medications?: Yes, will notify MD for an order Counseled patient on smoking cessation including recognizing danger situations, developing coping skills and basic information about quitting provided: Refused/Declined practical counseling Social History:  Social History   Substance and Sexual Activity  Alcohol Use No     Social History   Substance and Sexual Activity  Drug Use Not Currently  . Types: Marijuana     Additional Social History:                           Allergies:   Allergies  Allergen Reactions  . Penicillins Hives    Has patient had a PCN reaction causing immediate rash, facial/tongue/throat swelling, SOB or lightheadedness with hypotension: Yes Has patient had a PCN reaction causing severe rash involving mucus membranes or skin necrosis: No Has patient had a PCN reaction that required hospitalization: No Has patient had a PCN reaction occurring within the last 10 years: No If all of the above answers are "NO", then may proceed with Cephalosporin use.   . Sulfa Antibiotics Hives   Lab Results:  Results for orders placed or performed during the hospital encounter of 08/13/18 (from the past 48 hour(s))  Comprehensive metabolic panel     Status: Abnormal   Collection Time: 08/13/18  6:25 PM  Result Value Ref Range   Sodium 142 135 - 145 mmol/L   Potassium 3.8 3.5 - 5.1 mmol/L   Chloride 111 98 - 111 mmol/L   CO2 22 22 - 32 mmol/L   Glucose, Bld 145 (H) 70 -  99 mg/dL   BUN 15 6 - 20 mg/dL   Creatinine, Ser 3.08 0.61 - 1.24 mg/dL   Calcium 8.5 (L) 8.9 - 10.3 mg/dL   Total Protein 7.1 6.5 - 8.1 g/dL   Albumin 4.0 3.5 - 5.0 g/dL   AST 26 15 - 41 U/L   ALT 22 0 - 44 U/L   Alkaline Phosphatase 84 38 - 126 U/L   Total Bilirubin 0.3 0.3 - 1.2 mg/dL   GFR calc non Af Amer >60 >60 mL/min   GFR calc Af Amer >60 >60 mL/min   Anion gap 9 5 - 15    Comment: Performed at [facility], [address], [place], [place] [address]  Ethanol     Status: None   Collection Time: 08/13/18  6:25 PM  Result Value Ref Range   Alcohol, Ethyl (B) <10 <10 mg/dL    Comment: (NOTE) Lowest detectable limit for serum alcohol is 10 mg/dL. For medical purposes only. Performed at [facility], [address] [place], [place] [address]   Salicylate level     Status: None   Collection Time: 08/13/18  6:25 PM  Result Value Ref Range   Salicylate Lvl <7.0 2.8 - 30.0  mg/dL    Comment: Performed at [facility], [address] [place], [place] [address]  Acetaminophen level     Status: Abnormal   Collection Time: 08/13/18  6:25 PM  Result Value Ref Range   Acetaminophen (Tylenol), Serum <10 (L) 10 - 30 ug/mL    Comment: (NOTE) Therapeutic concentrations vary significantly. A range of 10-30 ug/mL  may be an effective concentration for many patients. However, some  are best treated at concentrations outside of this range. Acetaminophen concentrations >150 ug/mL at 4 hours after ingestion  and >50 ug/mL at 12 hours after ingestion are often associated with  toxic reactions. Performed at [facility], [address] [place], [place] [address]   cbc     Status: None   Collection Time: 08/13/18  6:25 PM  Result Value Ref Range   WBC 8.6 4.0 - 10.5 K/uL   RBC 5.04 4.22 - 5.81 MIL/uL   Hemoglobin 14.7 13.0 - 17.0 g/dL   HCT 10.2 72.5 - 36.6 %   MCV 88.9 80.0 - 100.0 fL   MCH 29.2 26.0 - 34.0 pg   MCHC 32.8 30.0 - 36.0 g/dL   RDW 44.0 34.7 - 42.5 %   Platelets 273 150 - 400 K/uL   nRBC 0.0 0.0 - 0.2 %    Comment: Performed at [facility], [address], [place], [place] [address]  Valproic acid level     Status: Abnormal   Collection Time: 08/13/18  6:25 PM  Result Value Ref Range   Valproic Acid Lvl <10 (L) 50.0 - 100.0 ug/mL    Comment: RESULT CONFIRMED BY MANUAL DILUTION AKT Performed at [facility], [address] [place], [place] [address]   Hemoglobin A1c     Status: None   Collection Time: 08/13/18  6:25 PM  Result Value Ref Range   Hgb A1c MFr Bld 5.2 4.8 - 5.6 %    Comment: (NOTE) Pre diabetes:          5.7%-6.4% Diabetes:              >6.4% Glycemic control for   <7.0% adults with diabetes    Mean Plasma Glucose 102.54 mg/dL    Comment: Performed at [facility], [address] [place], [place] [address]  Lipid panel     Status: Abnormal   Collection Time: 08/13/18  6:25 PM   Result Value Ref Range   Cholesterol 167 0 - 200 mg/dL   Triglycerides 191281 (H) <150 mg/dL   HDL 35 (L) >47>40 mg/dL   Total CHOL/HDL Ratio 4.8 RATIO   VLDL 56 (H) 0 - 40 mg/dL   LDL Cholesterol 76 0 - 99 mg/dL    Comment:        Total Cholesterol/HDL:CHD Risk Coronary Heart Disease Risk Table                     Men   Women  1/2 Average Risk   3.4   3.3  Average Risk       5.0   4.4  2 X Average Risk   9.6   7.1  3 X Average Risk  23.4   11.0        Use the calculated Patient Ratio above and the CHD Risk Table to determine the patient's CHD Risk.        ATP III CLASSIFICATION (LDL):  <100     mg/dL   Optimal  [id number]  mg/dL   Near or Above                    Optimal  130-159  mg/dL   Borderline  [id number]  mg/dL   High  >784>190     mg/dL   Very High Performed at [facility], [address] [place], [place] [address]   Urine Drug Screen, Qualitative     Status: Abnormal   Collection Time: 08/13/18 10:44 PM  Result Value Ref Range   Tricyclic, Ur Screen POSITIVE (A) NONE DETECTED   Amphetamines, Ur Screen NONE DETECTED NONE DETECTED   MDMA (Ecstasy)Ur Screen NONE DETECTED NONE DETECTED   Cocaine Metabolite,Ur [place] NONE DETECTED NONE DETECTED   Opiate, Ur Screen NONE DETECTED NONE DETECTED   Phencyclidine (PCP) Ur S NONE DETECTED NONE DETECTED   Cannabinoid 50 Ng, Ur [place] POSITIVE (A) NONE DETECTED   Barbiturates, Ur Screen NONE DETECTED NONE DETECTED   Benzodiazepine, Ur Scrn POSITIVE (A) NONE DETECTED   Methadone Scn, Ur NONE DETECTED NONE DETECTED    Comment: (NOTE) Tricyclics + metabolites, urine    Cutoff 1000 ng/mL Amphetamines + metabolites, urine  Cutoff 1000 ng/mL MDMA (Ecstasy), urine              Cutoff 500 ng/mL Cocaine Metabolite, urine          Cutoff 300 ng/mL Opiate + metabolites, urine        Cutoff 300 ng/mL Phencyclidine (PCP), urine         Cutoff 25 ng/mL Cannabinoid, urine                 Cutoff 50 ng/mL Barbiturates + metabolites, urine  Cutoff  200 ng/mL Benzodiazepine, urine              Cutoff 200 ng/mL Methadone, urine                   Cutoff 300 ng/mL The urine drug screen provides only a preliminary, unconfirmed analytical test result and should not be used for non-medical purposes. Clinical consideration and professional judgment should be applied to any positive drug screen result due to possible interfering substances. A more specific alternate chemical method must be used in order  to obtain a confirmed analytical result. Gas chromatography / mass spectrometry (GC/MS) is the preferred confirmat ory method. Performed at [facility], [address] [place], [place] [address]     Blood Alcohol level:  Lab Results  Component Value Date   RaLPh [facility] <10 08/13/2018   ETH <10 07/15/2018    Metabolic Disorder Labs:  Lab Results  Component Value Date   HGBA1C 5.2 08/13/2018   MPG 102.54 08/13/2018   No results found for: PROLACTIN Lab Results  Component Value Date   CHOL 167 08/13/2018   TRIG 281 (H) 08/13/2018   HDL 35 (L) 08/13/2018   CHOLHDL 4.8 08/13/2018   VLDL 56 (H) 08/13/2018   LDLCALC 76 08/13/2018    Current Medications: Current Facility-Administered Medications  Medication Dose Route Frequency Provider Last Rate Last Dose  . acetaminophen (TYLENOL) tablet 650 mg  650 mg Oral Q6H PRN [name], MD      . alum & mag hydroxide-simeth (MAALOX/MYLANTA) 200-200-20 MG/5ML suspension 30 mL  30 mL Oral Q4H PRN [name], MD      . Brexpiprazole TABS 3 mg  3 mg Oral QHS [name], MD   3 mg at 08/14/18 2214  . divalproex (DEPAKOTE) DR tablet 500 mg  500 mg Oral Q8H Derrian Poli B, MD   500 mg at 08/15/18 0634  . FLUoxetine (PROZAC) capsule 60 mg  60 mg Oral Daily [name], MD   60 mg at 08/15/18 0820  . hydrOXYzine (ATARAX/VISTARIL) tablet 25 mg  25 mg Oral Q4H PRN [name], MD   25 mg at 08/14/18 2135  . magnesium hydroxide (MILK OF MAGNESIA) suspension 30  mL  30 mL Oral Daily PRN [name], MD      . nitroGLYCERIN (NITROSTAT) SL tablet 0.4 mg  0.4 mg Sublingual Q5 min PRN [name], MD      . pantoprazole (PROTONIX) EC tablet 40 mg  40 mg Oral Daily [name], MD   40 mg at 08/15/18 0820  . sucralfate (CARAFATE) tablet 1 g  1 g Oral QID [name], MD   1 g at 08/15/18 1244  . traZODone (DESYREL) tablet 100 mg  100 mg Oral QHS PRN [name], [name], MD   100 mg at 08/14/18 2249   PTA Medications: Medications Prior to Admission  Medication Sig Dispense Refill Last Dose  . ALPRAZolam (XANAX XR) 2 MG 24 hr tablet Take 2 mg by mouth every morning.   Not Taking at Unknown time  . ALPRAZolam (XANAX) 1 MG tablet Take 1 mg by mouth 2 (two) times daily as needed for anxiety.   Not Taking at Unknown time  . Brexpiprazole (REXULTI) 3 MG TABS Take 3 mg by mouth at bedtime.   Past Week at Unknown time  . clonazePAM (KLONOPIN) 1 MG tablet Take 1 mg by mouth 2 (two) times daily.   Past Week at Unknown time  . divalproex (DEPAKOTE ER) 500 MG 24 hr tablet Take 2 tablets (1,000 mg total) by mouth at bedtime. (Patient taking differently: Take 1,500 mg by mouth at bedtime. ) 60 tablet 0 Past Week at Unknown time  . Eszopiclone (ESZOPICLONE) 3 MG TABS Take 3 mg by mouth at bedtime. Take immediately before bedtime   Not Taking at Unknown time  . FLUoxetine (PROZAC) 40 MG capsule Take 1 capsule (40 mg total) by mouth daily. (Patient taking differently: Take 60 mg by mouth daily. )  30 capsule 0 Past Week at Unknown time  . hydrOXYzine (VISTARIL) 25 MG capsule Take 25-50 mg by mouth 4 (four) times daily as needed.   Past Week at Unknown time  . nitroGLYCERIN (NITROSTAT) 0.4 MG SL tablet Place 1 tablet (0.4 mg total) under the tongue every 5 (five) minutes as needed for chest pain. (Patient not taking: Reported on 08/14/2018) 10 tablet 0 Not Taking at Unknown time  . pantoprazole (PROTONIX) 40 MG tablet Take 1 tablet (40 mg total) by mouth daily.  (Patient not taking: Reported on 08/14/2018) 30 tablet 1 Not Taking at Unknown time  . risperiDONE (RISPERDAL) 3 MG tablet Take 1 tablet (3 mg total) by mouth at bedtime. (Patient not taking: Reported on 08/14/2018) 30 tablet 0 Not Taking at Unknown time  . sucralfate (CARAFATE) 1 g tablet Take 1 tablet (1 g total) by mouth 4 (four) times daily. (Patient not taking: Reported on 08/14/2018) 60 tablet 0 Not Taking at Unknown time    Musculoskeletal: Strength & Muscle Tone: within normal limits Gait & Station: normal Patient leans: N/A  Psychiatric Specialty Exam: Physical Exam  Nursing note and vitals reviewed. Constitutional: He is oriented to person, place, and time. He appears well-developed and well-nourished.  HENT:  Head: Normocephalic and atraumatic.  Eyes: Pupils are equal, round, and reactive to light. Conjunctivae and EOM are normal.  Neck: Normal range of motion. Neck supple.  Cardiovascular: Normal rate and regular rhythm.  Respiratory: Effort normal and breath sounds normal.  GI: Soft. Bowel sounds are normal.  Musculoskeletal: Normal range of motion.  Neurological: He is alert and oriented to person, place, and time.  Skin: Skin is warm and dry.  Psychiatric: His affect is blunt. His speech is delayed. He is slowed, withdrawn and actively hallucinating. Cognition and memory are normal. He expresses impulsivity. He exhibits a depressed mood. He expresses homicidal and suicidal ideation.    Review of Systems  Neurological: Negative.   Psychiatric/Behavioral: Positive for depression, hallucinations, substance abuse and suicidal ideas.  All other systems reviewed and are negative.   Blood pressure 114/84, pulse 69, temperature (!) 97.4 F (36.3 C), temperature source Oral, resp. rate 18, height 5\' 11"  (1.803 m), weight 126.1 kg, SpO2 100 %.Body mass index is 38.77 kg/m.  See SRA                                                  Sleep:  Number of Hours: 6     Treatment Plan Summary: Daily contact with patient to assess and evaluate symptoms and progress in treatment and Medication management   Mr. Cory ia a 33 year old male with a history of bipolar disorder admitted for auditory command hallucinations to kill in spite of medication compliance but facing severe social stressors.  #Suicidal and homicidal ideation -patient able to contract for safety in the hospital  #Mood/psychosis -continue Rexulti 3 mg daily -Prozac 60 mg daily -Depakote 500 mg TID -Trazodone 100 mg nightly  #GERD -Protonix 40 mg daily -Carafate QID  #Cannabis abuswe -patient minimizes problems and declines treatment  #Labs -EKG  #Disposition -discharge to home -follow up with [facility]   Observation Level/Precautions:  15 minute checks  Laboratory:  CBC Chemistry Profile UDS UA  Psychotherapy:    Medications:    Consultations:    Discharge Concerns:    Estimated LOS:  Other:     Physician Treatment Plan for Primary Diagnosis: Bipolar I disorder, current or most recent episode depressed, with psychotic features (HCC) Long Term Goal(s): Improvement in symptoms so as ready for discharge  Short Term Goals: Ability to identify changes in lifestyle to reduce recurrence of condition will improve, Ability to verbalize feelings will improve, Ability to disclose and discuss suicidal ideas, Ability to demonstrate self-control will improve, Ability to identify and develop effective coping behaviors will improve, Ability to maintain clinical measurements within normal limits will improve, Compliance with prescribed medications will improve and Ability to identify triggers associated with substance abuse/mental health issues will improve  Physician Treatment Plan for Secondary Diagnosis: Principal Problem:   Bipolar I disorder, current or most recent episode depressed, with psychotic features (HCC) Active Problems:   Symptomatic bradycardia   Cannabis use  disorder, moderate, dependence (HCC)   GERD (gastroesophageal reflux disease)   Cardiac pacemaker in situ  Long Term Goal(s): Improvement in symptoms so as ready for discharge  Short Term Goals: Ability to identify changes in lifestyle to reduce recurrence of condition will improve, Ability to disclose and discuss suicidal ideas and Ability to identify triggers associated with substance abuse/mental health issues will improve  I certify that inpatient services furnished can reasonably be expected to improve the patient's condition.    Kristine Linea, MD 1/14/202012:47 PM

## 2018-08-15 NOTE — Progress Notes (Signed)
ECG with copy placed in the chart for MD review.

## 2018-08-15 NOTE — [facility] Suicide Risk Assessment (Signed)
[facility] INPATIENT:  Family/Significant Other Suicide Prevention Education  Suicide Prevention Education:  Contact Attempts: June Parrish, mother, [phone] has been identified by the patient as the family member/significant other with whom the patient will be residing, and identified as the person(s) who will aid the patient in the event of a mental health crisis.  With written consent from the patient, two attempts were made to provide suicide prevention education, prior to and/or following the patient's discharge.  We were unsuccessful in providing suicide prevention education.  A suicide education pamphlet was given to the patient to share with family/significant other.  Date and time of first attempt: 08/15/2018 at 3:50pm Date and time of second attempt: Second attempt is needed.  Harden Mo, LCSW 08/15/2018, 3:51 PM

## 2018-08-16 MED ORDER — NITROGLYCERIN 0.4 MG SL SUBL: 0.4000 mg | SUBLINGUAL_TABLET | SUBLINGUAL | 0 refills | Status: AC | PRN

## 2018-08-16 MED ORDER — FLUOXETINE HCL 20 MG PO CAPS: 60.0000 mg | ORAL_CAPSULE | Freq: Every day | ORAL | 3 refills | Status: DC

## 2018-08-16 NOTE — Progress Notes (Signed)
  [facility] Adult Case Management Discharge Plan :  Will you be returning to the same living situation after discharge:  Yes,  pt reports that he is returning home. At discharge, do you have transportation home?: Yes,  pt reports mom will provide transportation. Do you have the ability to pay for your medications: Yes,  pt has [employer]  Release of information consent forms completed and in the chart;  Patient's signature needed at discharge.  Patient to Follow up at: Follow-up Information    [facility] Follow up.   Specialty:  Behavioral Health Why:  Please attend appointment with Hulan Fray at [facility] and medication management appointment 2/4 at 2:30pm. Contact information: [address] [place] [address] [phone]           Next level of care provider has access to [facility] Link:no  Safety Planning and Suicide Prevention discussed: Yes,  SPE completed with the patient. CSW attempted to contact mom, however was unable to reach her.  Have you used any form of tobacco in the last 30 days? (Cigarettes, Smokeless Tobacco, Cigars, and/or Pipes): Yes  Has patient been referred to the Quitline?: Patient refused referral  Patient has been referred for addiction treatment: Pt. refused referral  Harden Mo, LCSW 08/16/2018, 9:02 AM

## 2018-08-16 NOTE — Discharge Summary (Signed)
Physician Discharge Summary Note  Patient:  Marguerita MerlesDevin Dean Trenkamp is an 33 y.o., male MRN:  [id number] DOB:  [birth year] Patient phone:  [phone] (home)  Patient address:   [address] [place] [id number],  Total Time spent with patient: 20 minutes plus 15 min on care coordination and documentation  Date of Admission:  08/14/2018 Date of Discharge: 08/16/2018  Reason for Admission:  Suicidal gesture.  History of Present Illness:   Identifying data. Mr. Lawerance BachBurns is a 33 year old male with a history of bipolar disorder.  Chief complaint. "A lot of things have changed."  History of present illness. Information was obtained from the patient and the chart. The patient came to the ER complaining os voices commanding him to kill himself and his ex-wife who took their children out of the state. The voices started one week aco just after hia visit with his regular psychiatrist at the [facility]. He reports good medication compliance but his VPA level; was very low on admission. He does not report worsening of depression or heightened anxiety, just new onset of voices. He denies substance abuse but positive for cannabis.  During the interview, the patient is cool and collected, not in any distress from the voices. He roprts good sleep and appetite. He is not interested in any medication changes and would like to postpone it until his next appointment with his primary psychiatrist at the end of January. He complains of being separated from his children, ages 795 and 489 but they did visit [place] over Chrismas but the patient saw the only briefly. He has another chils with a girlfriend, who also lives out of town. They are trying to "work things out".  Past psychiatric history. Diagnosed with bipolar and schizoaffective disorder. Last hospitalization was here in the fall. He was stabilized on Depakote, Risperdal and Prozac. Risperdal was changed to Rexulti in the community. There is a history of suicide  attempt.  Family psychiatric history. None reported.  Social history. Due to symptomatic bradycardia, he had pacemaker placed. He clains to have syncopal episodes every couple of weeks still. Unable to wark. Applied for disability. Lives independently supported by his grandparents. Attends church.  Principal Problem: Bipolar I disorder, current or most recent episode depressed, with psychotic features [facility](HCC) Discharge Diagnoses: Principal Problem:   Bipolar I disorder, current or most recent episode depressed, with psychotic features (HCC) Active Problems:   Symptomatic bradycardia   Cannabis use disorder, moderate, dependence (HCC)   GERD (gastroesophageal reflux disease)   Cardiac pacemaker in situ   Past Medical History:  Past Medical History:  Diagnosis Date  . Bipolar 1 disorder (HCC)   . GERD (gastroesophageal reflux disease)   . HTN (hypertension)   . Schizoaffective disorder (HCC)   . 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 Darrold JunkerParaschos    Past Surgical History:  Procedure Laterality Date  . ESOPHAGOGASTRODUODENOSCOPY (EGD) WITH PROPOFOL N/A 04/26/2018   Procedure: ESOPHAGOGASTRODUODENOSCOPY (EGD) WITH PROPOFOL;  Surgeon: Wyline MoodAnna, Kiran, MD;  Location[facility] ENDOSCOPY;  Service: Gastroenterology;  Laterality: N/A;  . KNEE ARTHROSCOPY    . PACEMAKER INSERTION Left 05/15/2018   Procedure: INSERTION PACEMAKER;  Surgeon: Marcina MillardParaschos, Alexander, MD;  Location: [facility] ORS;  Service: Cardiovascular;  Laterality: Left;   Family History:  Family History  Problem Relation Age of Onset  . Kidney cancer Neg Hx   . Bladder Cancer Neg Hx   . Prostate cancer Neg Hx    Social History:  Social History  Substance and Sexual Activity  Alcohol Use No     Social History   Substance and Sexual Activity  Drug Use Not Currently  . Types: Marijuana    Social History   Socioeconomic History  . Marital status: Legally Separated    Spouse name: Not on file  .  Number of children: 3  . Years of education: Not on file  . Highest education level: Not on file  Occupational History  . Not on file  Social Needs  . Financial resource strain: Not hard at all  . Food insecurity:    Worry: Never true    Inability: Never true  . Transportation needs:    Medical: No    Non-medical: No  Tobacco Use  . Smoking status: Former Games developer  . Smokeless tobacco: Current User    Types: Snuff  Substance and Sexual Activity  . Alcohol use: No  . Drug use: Not Currently    Types: Marijuana  . Sexual activity: Not Currently  Lifestyle  . Physical activity:    Days per week: 7 days    Minutes per session: 60 min  . Stress: Not at all  Relationships  . Social connections:    Talks on phone: More than three times a week    Gets together: More than three times a week    Attends religious service: Never    Active member of club or organization: No    Attends meetings of clubs or organizations: Never    Relationship status: Separated  Other Topics Concern  . Not on file  Social History Narrative  . Not on file    Hospital Course:    Mr. Kaiser ia a 33 year old male with a history of bipolar disorder admitted for auditory command hallucinations to kill himself and his ex-wife in spite of medication compliance but facing severe social stressors. The patient did not wish any medication adjustments and tolerated his regular regimen well. At the time of discharge, the patient was no longer suicidal or homicidal. He was able to contract for safety. He is forward thinking and optimistic about the future.  #Mood/psychosis, improved -continue Rexulti 3 mg daily -Prozac 60 mg daily -Depakote 100 mg nightly   #GERD, patient no longer takes it at home -Protonix 40 mg daily -Carafate QID  #Cannabis abuswe -patient minimizes problems and declines treatment  #Labs -EKG reviewed, electronic atrial pacemaker rhythm, QTc 448  #Disposition -discharge to  home -follow up with [facility]  Physical Findings: AIMS:  , ,  ,  ,    CIWA:    COWS:     Musculoskeletal: Strength & Muscle Tone: within normal limits Gait & Station: normal Patient leans: N/A  Psychiatric Specialty Exam: Physical Exam  Nursing note and vitals reviewed. Psychiatric: He has a normal mood and affect. His speech is normal and behavior is normal. Thought content normal. Cognition and memory are normal. He expresses impulsivity.    Review of Systems  Neurological: Negative.   Psychiatric/Behavioral: Positive for substance abuse.  All other systems reviewed and are negative.   Blood pressure 109/78, pulse 70, temperature (!) 97.4 F (36.3 C), temperature source Oral, resp. rate 16, height 5\' 11"  (1.803 m), weight 126.1 kg, SpO2 98 %.Body mass index is 38.77 kg/m.  General Appearance: Casual  Eye Contact:  Good  Speech:  Clear and Coherent  Volume:  Normal  Mood:  Euthymic  Affect:  Appropriate  Thought Process:  Goal Directed and Descriptions of Associations: Intact  Orientation:  Full (Time, Place, and Person)  Thought Content:  WDL  Suicidal Thoughts:  No  Homicidal Thoughts:  No  Memory:  Immediate;   Fair Recent;   Fair Remote;   Fair  Judgement:  Impaired  Insight:  Shallow  Psychomotor Activity:  Normal  Concentration:  Concentration: Fair and Attention Span: Fair  Recall:  Fis[employer]Fair  Fund of Knowledge:  Fair  Language:  Fair  Akathisia:  No  Handed:  Right  AIMS (if indicated):     Assets:  Communication Skills Desire for Improvement Financial Resources/Insurance Intimacy Resilience Social Support  ADL's:  Intact  Cognition:  WNL  Sleep:  Number of Hours: 7.5     Have you used any form of tobacco in the last 30 days? (Cigarettes, Smokeless Tobacco, Cigars, and/or Pipes): Yes  Has this patient used any form of tobacco in the last 30 days? (Cigarettes, Smokeless Tobacco, Cigars, and/or Pipes) Yes, No  Blood Alcohol level:  Lab Results   Component Value Date   ETH <10 08/13/2018   ETH <10 07/15/2018    Metabolic Disorder Labs:  Lab Results  Component Value Date   HGBA1C 5.2 08/13/2018   MPG 102.54 08/13/2018   No results found for: PROLACTIN Lab Results  Component Value Date   CHOL 167 08/13/2018   TRIG 281 (H) 08/13/2018   HDL 35 (L) 08/13/2018   CHOLHDL 4.8 08/13/2018   VLDL 56 (H) 08/13/2018   LDLCALC 76 08/13/2018    See Psychiatric Specialty Exam and Suicide Risk Assessment completed by Attending Physician prior to discharge.  Discharge destination:  Home  Is patient on multiple antipsychotic therapies at discharge:  No   Has Patient had three or more failed trials of antipsychotic monotherapy by history:  No  Recommended Plan for Multiple Antipsychotic Therapies: NA  Discharge Instructions    Diet - low sodium heart healthy   Complete by:  As directed    Increase activity slowly   Complete by:  As directed      Allergies as of 08/16/2018      Reactions   Penicillins Hives   Has patient had a PCN reaction causing immediate rash, facial/tongue/throat swelling, SOB or lightheadedness with hypotension: Yes Has patient had a PCN reaction causing severe rash involving mucus membranes or skin necrosis: No Has patient had a PCN reaction that required hospitalization: No Has patient had a PCN reaction occurring within the last 10 years: No If all of the above answers are "NO", then may proceed with Cephalosporin use.   Sulfa Antibiotics Hives      Medication List    STOP taking these medications   ALPRAZolam 1 MG tablet Commonly known as:  XANAX   ALPRAZolam 2 MG 24 hr tablet Commonly known as:  XANAX XR   clonazePAM 1 MG tablet Commonly known as:  KLONOPIN   pantoprazole 40 MG tablet Commonly known as:  PROTONIX   risperiDONE 3 MG tablet Commonly known as:  RISPERDAL   sucralfate 1 g tablet Commonly known as:  CARAFATE     TAKE these medications     Indication  divalproex 500  MG 24 hr tablet Commonly known as:  DEPAKOTE ER Take 2 tablets (1,000 mg total) by mouth at bedtime. What changed:  how much to take  Indication:  bipolar disorder   eszopiclone 3 MG Tabs Generic drug:  Eszopiclone Take 3 mg by mouth at bedtime. Take immediately before bedtime  Indication:  Trouble Sleeping   FLUoxetine  20 MG capsule Commonly known as:  PROZAC Take 3 capsules (60 mg total) by mouth daily. Start taking on:  August 17, 2018 What changed:    medication strength  how much to take  Indication:  Depression   hydrOXYzine 25 MG capsule Commonly known as:  VISTARIL Take 25-50 mg by mouth 4 (four) times daily as needed.  Indication:  Feeling Anxious   nitroGLYCERIN 0.4 MG SL tablet Commonly known as:  NITROSTAT Place 1 tablet (0.4 mg total) under the tongue every 5 (five) minutes as needed for chest pain.  Indication:  Acute Angina Pectoris, chest pain, angina   REXULTI 3 MG Tabs Generic drug:  Brexpiprazole Take 3 mg by mouth at bedtime.  Indication:  Major Depressive Disorder      Follow-up Information    [facility] Follow up.   Specialty:  Behavioral Health Why:  Please attend appointment with Hulan Fray at [facility] and medication management appointment 2/4 at 2:30pm. Contact information: [address] [place] [address] [phone]           Follow-up recommendations:  Activity:  as tolerated Diet:  kow sodium heart healthy Other:  keep follow up appointments  Comments:    Signed: Kristine Linea, MD 08/16/2018, 9:41 AM

## 2018-08-16 NOTE — Tx Team (Signed)
Interdisciplinary Treatment and Diagnostic Plan Update  08/16/2018 Time of Session: 10:30AM Corey [name] MRN: [id number]  Principal Diagnosis: Bipolar I disorder, current or most recent episode depressed, with psychotic features (HCC)  Secondary Diagnoses: Principal Problem:   Bipolar I disorder, current or most recent episode depressed, with psychotic features (HCC) Active Problems:   Symptomatic bradycardia   Cannabis use disorder, moderate, dependence (HCC)   GERD (gastroesophageal reflux disease)   Cardiac pacemaker in situ   Current Medications:  Current Facility-Administered Medications  Medication Dose Route Frequency Provider Last Rate Last Dose  . acetaminophen (TYLENOL) tablet 650 mg  650 mg Oral Q6H PRN [name], MD      . alum & mag hydroxide-simeth (MAALOX/MYLANTA) 200-200-20 MG/5ML suspension 30 mL  30 mL Oral Q4H PRN [name], MD   30 mL at 08/16/18 1111  . Brexpiprazole TABS 3 mg  3 mg Oral QHS [name], MD   3 mg at 08/15/18 2122  . divalproex (DEPAKOTE) DR tablet 500 mg  500 mg Oral Q8H [name], [name], MD   500 mg at 08/16/18 0631  . FLUoxetine (PROZAC) capsule 60 mg  60 mg Oral Daily [name], MD   60 mg at 08/16/18 4098  . hydrOXYzine (ATARAX/VISTARIL) tablet 25 mg  25 mg Oral Q4H PRN [name], MD   25 mg at 08/15/18 2124  . magnesium hydroxide (MILK OF MAGNESIA) suspension 30 mL  30 mL Oral Daily PRN [name], MD      . nitroGLYCERIN (NITROSTAT) SL tablet 0.4 mg  0.4 mg Sublingual Q5 min PRN [name], MD      . pantoprazole (PROTONIX) EC tablet 40 mg  40 mg Oral Daily [name], MD   40 mg at 08/16/18 1191  . sucralfate (CARAFATE) tablet 1 g  1 g Oral QID [name], MD   1 g at 08/15/18 2121  . traZODone (DESYREL) tablet 100 mg  100 mg Oral QHS PRN [name], [name], MD   100 mg at 08/15/18 2124   PTA Medications: Medications Prior to Admission  Medication Sig Dispense Refill Last Dose   . ALPRAZolam (XANAX XR) 2 MG 24 hr tablet Take 2 mg by mouth every morning.   Not Taking at Unknown time  . ALPRAZolam (XANAX) 1 MG tablet Take 1 mg by mouth 2 (two) times daily as needed for anxiety.   Not Taking at Unknown time  . Brexpiprazole (REXULTI) 3 MG TABS Take 3 mg by mouth at bedtime.   Past Week at Unknown time  . clonazePAM (KLONOPIN) 1 MG tablet Take 1 mg by mouth 2 (two) times daily.   Past Week at Unknown time  . divalproex (DEPAKOTE ER) 500 MG 24 hr tablet Take 2 tablets (1,000 mg total) by mouth at bedtime. (Patient taking differently: Take 1,500 mg by mouth at bedtime. ) 60 tablet 0 Past Week at Unknown time  . Eszopiclone (ESZOPICLONE) 3 MG TABS Take 3 mg by mouth at bedtime. Take immediately before bedtime   Not Taking at Unknown time  . FLUoxetine (PROZAC) 40 MG capsule Take 1 capsule (40 mg total) by mouth daily. (Patient taking differently: Take 60 mg by mouth daily. ) 30 capsule 0 Past Week at Unknown time  . hydrOXYzine (VISTARIL) 25 MG capsule Take 25-50 mg by mouth 4 (four) times daily as needed.   Past Week at Unknown time  . pantoprazole (PROTONIX) 40 MG tablet Take  1 tablet (40 mg total) by mouth daily. (Patient not taking: Reported on 08/14/2018) 30 tablet 1 Not Taking at Unknown time  . risperiDONE (RISPERDAL) 3 MG tablet Take 1 tablet (3 mg total) by mouth at bedtime. (Patient not taking: Reported on 08/14/2018) 30 tablet 0 Not Taking at Unknown time  . sucralfate (CARAFATE) 1 g tablet Take 1 tablet (1 g total) by mouth 4 (four) times daily. (Patient not taking: Reported on 08/14/2018) 60 tablet 0 Not Taking at Unknown time  . [DISCONTINUED] nitroGLYCERIN (NITROSTAT) 0.4 MG SL tablet Place 1 tablet (0.4 mg total) under the tongue every 5 (five) minutes as needed for chest pain. (Patient not taking: Reported on 08/14/2018) 10 tablet 0 Not Taking at Unknown time    Patient Stressors: Health problems Medication change or noncompliance Substance abuse  Patient  Strengths:    Treatment Modalities: Medication Management, Group therapy, Case management,  1 to 1 session with clinician, Psychoeducation, Recreational therapy.   Physician Treatment Plan for Primary Diagnosis: Bipolar I disorder, current or most recent episode depressed, with psychotic features (HCC) Long Term Goal(s): Improvement in symptoms so as ready for discharge Improvement in symptoms so as ready for discharge   Short Term Goals: Ability to identify changes in lifestyle to reduce recurrence of condition will improve Ability to verbalize feelings will improve Ability to disclose and discuss suicidal ideas Ability to demonstrate self-control will improve Ability to identify and develop effective coping behaviors will improve Ability to maintain clinical measurements within normal limits will improve Compliance with prescribed medications will improve Ability to identify triggers associated with substance abuse/mental health issues will improve Ability to identify changes in lifestyle to reduce recurrence of condition will improve Ability to disclose and discuss suicidal ideas Ability to identify triggers associated with substance abuse/mental health issues will improve  Medication Management: Evaluate patient's response, side effects, and tolerance of medication regimen.  Therapeutic Interventions: 1 to 1 sessions, Unit Group sessions and Medication administration.  Evaluation of Outcomes: Adequate for Discharge  Physician Treatment Plan for Secondary Diagnosis: Principal Problem:   Bipolar I disorder, current or most recent episode depressed, with psychotic features (HCC) Active Problems:   Symptomatic bradycardia   Cannabis use disorder, moderate, dependence (HCC)   GERD (gastroesophageal reflux disease)   Cardiac pacemaker in situ  Long Term Goal(s): Improvement in symptoms so as ready for discharge Improvement in symptoms so as ready for discharge   Short Term Goals:  Ability to identify changes in lifestyle to reduce recurrence of condition will improve Ability to verbalize feelings will improve Ability to disclose and discuss suicidal ideas Ability to demonstrate self-control will improve Ability to identify and develop effective coping behaviors will improve Ability to maintain clinical measurements within normal limits will improve Compliance with prescribed medications will improve Ability to identify triggers associated with substance abuse/mental health issues will improve Ability to identify changes in lifestyle to reduce recurrence of condition will improve Ability to disclose and discuss suicidal ideas Ability to identify triggers associated with substance abuse/mental health issues will improve     Medication Management: Evaluate patient's response, side effects, and tolerance of medication regimen.  Therapeutic Interventions: 1 to 1 sessions, Unit Group sessions and Medication administration.  Evaluation of Outcomes: Adequate for Discharge   RN Treatment Plan for Primary Diagnosis: Bipolar I disorder, current or most recent episode depressed, with psychotic features (HCC) Long Term Goal(s): Knowledge of disease and therapeutic regimen to maintain health will improve  Short Term Goals: Ability to  demonstrate self-control, Ability to participate in decision making will improve, Ability to verbalize feelings will improve, Ability to disclose and discuss suicidal ideas, Ability to identify and develop effective coping behaviors will improve and Compliance with prescribed medications will improve  Medication Management: RN will administer medications as ordered by provider, will assess and evaluate patient's response and provide education to patient for prescribed medication. RN will report any adverse and/or side effects to prescribing provider.  Therapeutic Interventions: 1 on 1 counseling sessions, Psychoeducation, Medication administration,  Evaluate responses to treatment, Monitor vital signs and CBGs as ordered, Perform/monitor CIWA, COWS, AIMS and Fall Risk screenings as ordered, Perform wound care treatments as ordered.  Evaluation of Outcomes: Adequate for Discharge   LCSW Treatment Plan for Primary Diagnosis: Bipolar I disorder, current or most recent episode depressed, with psychotic features (HCC) Long Term Goal(s): Safe transition to appropriate next level of care at discharge, Engage patient in therapeutic group addressing interpersonal concerns.  Short Term Goals: Engage patient in aftercare planning with referrals and resources, Increase social support, Increase ability to appropriately verbalize feelings, Increase emotional regulation and Increase skills for wellness and recovery  Therapeutic Interventions: Assess for all discharge needs, 1 to 1 time with Social worker, Explore available resources and support systems, Assess for adequacy in community support network, Educate family and significant other(s) on suicide prevention, Complete Psychosocial Assessment, Interpersonal group therapy.  Evaluation of Outcomes: Adequate for Discharge   Progress in Treatment: Attending groups: No. Participating in groups: No. Taking medication as prescribed: Yes. Toleration medication: Yes. Family/Significant other contact made: Yes, individual(s) contacted:  CSW spoke with pt mother. Patient understands diagnosis: Yes. Discussing patient identified problems/goals with staff: Yes. Medical problems stabilized or resolved: Yes. Denies suicidal/homicidal ideation: Yes. Issues/concerns per patient self-inventory: No. Other: none  New problem(s) identified: No, Describe:  none  New Short Term/Long Term Goal(s):  Patient Goals:    Discharge Plan or Barriers: "leave here not in the slumps like I was when I got here."  Reason for Continuation of Hospitalization: Anxiety Depression Suicidal ideation  Estimated Length of  Stay:  Attendees: Patient: Metta ClinesDevin Duross 08/16/2018 12:00 PM  Physician: Dr. Jennet MaduroPucilowska, MD 08/16/2018 12:00 PM  Nursing:  08/16/2018 12:00 PM  RN Care Manager: 08/16/2018 12:00 PM  Social Worker: Penni HomansMichaela Emmalyne Giacomo, MSW, LCSW 08/16/2018 12:00 PM  Recreational Therapist: Hilbert BibleShay Outlaw, CTRS, LRT 08/16/2018 12:00 PM  Other:  08/16/2018 12:00 PM  Other:  08/16/2018 12:00 PM  Other: 08/16/2018 12:00 PM    Scribe for Treatment Team: Harden MoMichaela J Aasia Peavler, LCSW 08/16/2018 12:00 PM

## 2018-08-16 NOTE — [facility] Suicide Risk Assessment (Signed)
[facility] INPATIENT:  Family/Significant Other Suicide Prevention Education  Suicide Prevention Education:  Contact Attempts: June Parrish, [phone], mom has been identified by the patient as the family member/significant other with whom the patient will be residing, and identified as the person(s) who will aid the patient in the event of a mental health crisis.  With written consent from the patient, two attempts were made to provide suicide prevention education, prior to and/or following the patient's discharge.  We were unsuccessful in providing suicide prevention education.  A suicide education pamphlet was given to the patient to share with family/significant other.  Date and time of first attempt: 08/15/2018 at 3:50pm Date and time of second attempt: 08/16/2018 at 9:00AM  Harden Mo 08/16/2018, 8:59 AM

## 2018-08-16 NOTE — Plan of Care (Signed)
Patient said he is starting to feel better. Patient denies SI/HI/AVH with this Clinical research associate. Patient compliant with medication administration and procedures on the unit.   Problem: Education: Goal: Mental status will improve Outcome: Progressing Goal: Verbalization of understanding the information provided will improve Outcome: Progressing

## 2018-08-16 NOTE — B[facility] Suicide Risk Assessment (Signed)
[facility] Discharge Suicide Risk Assessment   Principal Problem: Bipolar I disorder, current or most recent episode depressed, with psychotic features [facility]) Discharge Diagnoses: Principal Problem:   Bipolar I disorder, current or most recent episode depressed, with psychotic features (HCC) Active Problems:   Symptomatic bradycardia   Cannabis use disorder, moderate, dependence (HCC)   GERD (gastroesophageal reflux disease)   Cardiac pacemaker in situ   Total Time spent with patient: 20 minutes  Musculoskeletal: Strength & Muscle Tone: within normal limits Gait & Station: normal Patient leans: N/A  Psychiatric Specialty Exam: Review of Systems  Neurological: Negative.   Psychiatric/Behavioral: Negative.   All other systems reviewed and are negative.   Blood pressure 109/78, pulse 70, temperature (!) 97.4 F (36.3 C), temperature source Oral, resp. rate 16, height 5\' 11"  (1.803 m), weight 126.1 kg, SpO2 98 %.Body mass index is 38.77 kg/m.  General Appearance: Casual  Eye Contact::  Good  Speech:  Clear and Coherent409  Volume:  Normal  Mood:  Euthymic  Affect:  Appropriate  Thought Process:  Goal Directed and Descriptions of Associations: Intact  Orientation:  Full (Time, Place, and Person)  Thought Content:  WDL  Suicidal Thoughts:  No  Homicidal Thoughts:  No  Memory:  Immediate;   Fair Recent;   Fair Remote;   Fair  Judgement:  Impaired  Insight:  Shallow  Psychomotor Activity:  Normal  Concentration:  Fair  Recall:  [employer] of Knowledge:Fair  Language: Fair  Akathisia:  No  Handed:  Right  AIMS (if indicated):     Assets:  Communication Skills Desire for Improvement Financial Resources/Insurance Housing Resilience Social Support  Sleep:  Number of Hours: 7.5  Cognition: WNL  ADL's:  Intact   Mental Status Per Nursing Assessment::   On Admission:  NA  Demographic Factors:  Male, Divorced or widowed, Caucasian, Living alone and Unemployed  Loss  Factors: Decrease in vocational status, Loss of significant relationship, Decline in physical health and Financial problems/change in socioeconomic status  Historical Factors: Prior suicide attempts and Impulsivity  Risk Reduction Factors:   Responsible for children under 17 years of age, Sense of responsibility to family, Religious beliefs about death, Positive social support and Positive therapeutic relationship  Continued Clinical Symptoms:  Bipolar Disorder:   Depressive phase Depression:   Comorbid alcohol abuse/dependence Alcohol/Substance Abuse/Dependencies  Cognitive Features That Contribute To Risk:  None    Suicide Risk:  Minimal: No identifiable suicidal ideation.  Patients presenting with no risk factors but with morbid ruminations; may be classified as minimal risk based on the severity of the depressive symptoms  Follow-up Information    [facility] Follow up.   Specialty:  Behavioral Health Why:  Please attend appointment with Hulan Fray at [facility] and medication management appointment 2/4 at 2:30pm. Contact information: [address] [place] [address] [phone]           Plan Of Care/Follow-up recommendations:  Activity:  as tolerated Diet:  low sodium heart healthy Other:  keep follow up appointments  Kristine Linea, MD 08/16/2018, 9:23 AM

## 2018-08-16 NOTE — Progress Notes (Signed)
Recreation Therapy Notes   Date: 08/16/2018  Time: 9:30 am   Location: Craft room   Behavioral response: N/A   Intervention Topic: Coping  Discussion/Intervention: Patient did not attend group.   Clinical Observations/Feedback:  Patient did not attend group.   [name] LRT/CTRS         Corey [name] 08/16/2018 12:31 PM 

## 2018-08-16 NOTE — BHH Suicide Risk Assessment (Signed)
[facility] INPATIENT:  Family/Significant Other Suicide Prevention Education  Suicide Prevention Education:  Education Completed; Corey [name] [phone], mom, has been identified by the patient as the family member/significant other with whom the patient will be residing, and identified as the person(s) who will aid the patient in the event of a mental health crisis (suicidal ideations/suicide attempt).  With written consent from the patient, the family member/significant other has been provided the following suicide prevention education, prior to the and/or following the discharge of the patient.  The suicide prevention education provided includes the following:  Suicide risk factors  Suicide prevention and interventions  National Suicide Hotline telephone number  [facility] assessment telephone number  [facility] Emergency Assistance 911  [facility] and/or Residential Mobile Crisis Unit telephone number  Request made of family/significant other to:  Remove weapons (e.g., guns, rifles, knives), all items previously/currently identified as safety concern.    Remove drugs/medications (over-the-counter, prescriptions, illicit drugs), all items previously/currently identified as a safety concern.  The family member/significant other verbalizes understanding of the suicide prevention education information provided.  The family member/significant other agrees to remove the items of safety concern listed above.  CSW was contacted by the patients mother.  Mother confirmed that there are no weapons in the home.  Mother reports that patient can not return to her home due to her having a one year old in the home.  Mother reports that the patient lives 40 minutes away and maternal grandfather lives across the street.  Mother reports that she will provide transportation home.     Harden Mo MSW, LCSW 08/16/2018, 9:07 AM

## 2018-08-16 NOTE — Progress Notes (Signed)
D - Patient was in his room upon arrival to the unit. Patient was pleasant during assessment and medication administration. Patient denies SI/HI/AVH and pain. Patient stated his anxiety was 5/10 and depression was 4/10. See MAR. Patient was seen interacting appropriately with staff and peers on the unit by this Clinical research associate. Patient was asking about when he would be discharged. Patient given education.   A - Patient compliant with medication administration per MD orders and procedures on the unit. Patient given support and encouragement. Patient informed to let staff know if there are any issues or problems on the unit.   R - Patient being monitored Q 15 minutes for safety per unit protocol. Patient remains safe on the unit at this time.

## 2018-09-12 ENCOUNTER — Emergency Department
Admission: EM | Admit: 2018-09-12 | Discharge: 2018-09-12 | Disposition: A | Discharge status: Home / Self Care | Payer: BLUE CROSS/BLUE SHIELD | Attending: Emergency Medicine | Admitting: Emergency Medicine

## 2018-09-12 ENCOUNTER — Emergency Department: Payer: BLUE CROSS/BLUE SHIELD

## 2018-09-12 ENCOUNTER — Other Ambulatory Visit: Payer: Self-pay

## 2018-09-12 DIAGNOSIS — S59911A Unspecified injury of right forearm, initial encounter: Secondary | ICD-10-CM

## 2018-09-12 DIAGNOSIS — Y9389 Activity, other specified: Secondary | ICD-10-CM | POA: Insufficient documentation

## 2018-09-12 DIAGNOSIS — Z79899 Other long term (current) drug therapy: Secondary | ICD-10-CM | POA: Insufficient documentation

## 2018-09-12 DIAGNOSIS — Y929 Unspecified place or not applicable: Secondary | ICD-10-CM | POA: Diagnosis not present

## 2018-09-12 DIAGNOSIS — Z950 Presence of cardiac pacemaker: Secondary | ICD-10-CM | POA: Diagnosis not present

## 2018-09-12 DIAGNOSIS — Y998 Other external cause status: Secondary | ICD-10-CM | POA: Diagnosis not present

## 2018-09-12 DIAGNOSIS — F17228 Nicotine dependence, chewing tobacco, with other nicotine-induced disorders: Secondary | ICD-10-CM | POA: Insufficient documentation

## 2018-09-12 DIAGNOSIS — W010XXA Fall on same level from slipping, tripping and stumbling without subsequent striking against object, initial encounter: Secondary | ICD-10-CM | POA: Diagnosis not present

## 2018-09-12 DIAGNOSIS — I10 Essential (primary) hypertension: Secondary | ICD-10-CM | POA: Insufficient documentation

## 2018-09-12 MED ORDER — TRAMADOL HCL 50 MG PO TABS: 50.0000 mg | ORAL_TABLET | Freq: Four times a day (QID) | ORAL | 0 refills | Status: AC | PRN

## 2018-09-12 MED ORDER — HYDROCODONE-ACETAMINOPHEN 5-325 MG PO TABS
1.0000 | ORAL_TABLET | Freq: Once | ORAL | Status: AC
  Administered 2018-09-12: 1 via ORAL
  Filled 2018-09-12: qty 1

## 2018-09-12 NOTE — ED Provider Notes (Signed)
[facility] Emergency Department Provider Note ____________________________________________   First MD Initiated Contact with Patient 09/12/18 1904     (approximate)  I have reviewed the triage vital signs and the nursing notes.   HISTORY  Chief Complaint Arm Injury    HPI Corey [name] is a 33 y.o. male presents with right arm injury acute onset just prior to coming to the hospital when he fell on outstretched hand.  He reports pain just proximal to the right wrist.  He denies any other injuries.  The patient states he is concerned because he had surgery in that arm before.  Past Medical History:  Diagnosis Date  . Bipolar 1 disorder (HCC)   . GERD (gastroesophageal reflux disease)   . HTN (hypertension)   . Schizoaffective disorder (HCC)   . 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 GERD (gastroesophageal reflux disease) 08/15/2018  . 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 Cannabis use disorder, moderate, dependence (HCC) 08/14/2018  . Psychosis (HCC) 06/29/2018  . Bipolar I disorder, current or most recent episode depressed, with psychotic features (HCC) 05/04/2018  . Syncope and collapse 04/29/2018  . Hypotension 04/29/2018  . Bradycardia 04/24/2018  . 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 Kidney stone 03/26/2018    Past Surgical History:  Procedure Laterality Date  . ESOPHAGOGASTRODUODENOSCOPY (EGD) WITH PROPOFOL N/A 04/26/2018   Procedure: ESOPHAGOGASTRODUODENOSCOPY (EGD) WITH PROPOFOL;  Surgeon: Wyline Mood, MD;  Location: [facility] ENDOSCOPY;  Service: Gastroenterology;  Laterality: N/A;  . KNEE ARTHROSCOPY    . PACEMAKER INSERTION Left 05/15/2018   Procedure: INSERTION PACEMAKER;  Surgeon: Marcina Millard, MD;  Location: [facility] ORS;  Service: Cardiovascular;  Laterality: Left;    Prior to Admission medications   Medication Sig Start Date End  Date Taking? Authorizing Provider  Brexpiprazole (REXULTI) 3 MG TABS Take 3 mg by mouth at bedtime.    [provider]  divalproex (DEPAKOTE ER) 500 MG 24 hr tablet Take 2 tablets (1,000 mg total) by mouth at bedtime. Patient taking differently: Take 1,500 mg by mouth at bedtime.  07/04/18   Hessie Knows, MD  Eszopiclone (ESZOPICLONE) 3 MG TABS Take 3 mg by mouth at bedtime. Take immediately before bedtime    [provider]  FLUoxetine (PROZAC) 20 MG capsule Take 3 capsules (60 mg total) by mouth daily. 08/17/18   [name], Braulio Conte B, MD  hydrOXYzine (VISTARIL) 25 MG capsule Take 25-50 mg by mouth 4 (four) times daily as needed.    [provider]  nitroGLYCERIN (NITROSTAT) 0.4 MG SL tablet Place 1 tablet (0.4 mg total) under the tongue every 5 (five) minutes as needed for chest pain. 08/16/18 08/16/19  [name], Ellin Goodie, MD    Allergies Penicillins and Sulfa antibiotics  Family History  Problem Relation Age of Onset  . Kidney cancer Neg Hx   . Bladder Cancer Neg Hx   . Prostate cancer Neg Hx     Social History Social History   Tobacco Use  . Smoking status: Former Games developer  . Smokeless tobacco: Current User    Types: Snuff  Substance Use Topics  . Alcohol use: No  . Drug use: Not Currently    Types: Marijuana    Review of Systems  Constitutional: No fever Eyes: No redness. ENT: No neck pain. Cardiovascular: Denies chest pain. Musculoskeletal: Positive for right arm injury. Skin: Negative for rash. Neurological: Negative for focal weakness  or numbness.   ____________________________________________   PHYSICAL EXAM:  VITAL SIGNS: ED Triage Vitals  Enc Vitals Group     BP 09/12/18 1853 130/88     Pulse Rate 09/12/18 1853 73     Resp 09/12/18 1853 18     Temp 09/12/18 1853 98.3 F (36.8 C)     Temp Source 09/12/18 1853 Oral     SpO2 09/12/18 1853 97 %     Weight 09/12/18 1854 260 lb (117.9 kg)     Height 09/12/18 1854 5\' 11"   (1.803 m)     Head Circumference --      Peak Flow --      Pain Score 09/12/18 1854 7     Pain Loc --      Pain Edu? --      Excl. in GC? --     Constitutional: Alert and oriented. Well appearing and in no acute distress. Eyes: Conjunctivae are normal.  Head: Atraumatic. Nose: No congestion/rhinnorhea. Mouth/Throat: Mucous membranes are moist.   Neck: Normal range of motion.  Cardiovascular: Good peripheral circulation. Respiratory: Normal respiratory effort.  No retractions. Lungs CTAB. Gastrointestinal: Soft and nontender.   Musculoskeletal: Extremities warm and well perfused.  Tenderness to right distal forearm but not over the wrist or elbow.  Full range of motion of the wrist and elbow. Neurologic:  Normal speech and language. No gross focal neurologic deficits are appreciated.  Motor and sensory intact in median/radial/ulnar to right hand. Skin:  No rash noted. Psychiatric: Mood and affect are normal. Speech and behavior are normal.  ____________________________________________   LABS (all labs ordered are listed, but only abnormal results are displayed)  Labs Reviewed - No data to display ____________________________________________  EKG   ____________________________________________  RADIOLOGY    ____________________________________________   PROCEDURES  Procedure(s) performed: No  Procedures  Critical Care performed: No ____________________________________________   INITIAL IMPRESSION / ASSESSMENT AND PLAN / ED COURSE  Pertinent labs & imaging results that were available during my care of the patient were reviewed by me and considered in my medical decision making (see chart for details).  33 year old male with PMH as noted above presents with a right forearm injury after a FOOSH.  He has no other injuries.  On exam the vital signs are normal and the patient is comfortable appearing.  The right arm is neuro/vascular intact and there is tenderness  over the distal forearm.  I do not note any deformity as was mentioned in triage.  He has full range of motion at the wrist and elbow.  We will obtain an x-ray to further evaluate.  ----------------------------------------- 8:11 PM on 09/12/2018 -----------------------------------------  X-rays negative for acute fracture.  The patient is stable for discharge home.  I gave him RICE instructions and return precautions, and he expressed understanding. ____________________________________________   FINAL CLINICAL IMPRESSION(S) / ED DIAGNOSES  Final diagnoses:  Injury of right forearm, initial encounter      NEW MEDICATIONS STARTED DURING THIS VISIT:  New Prescriptions   No medications on file     Note:  This document was prepared using Dragon voice recognition software and may include unintentional dictation errors.    Dionne Bucy, MD 09/12/18 2011

## 2018-09-12 NOTE — ED Triage Notes (Signed)
Pt states he slipped and fell on the wet floor today and has obvious deformity to the right FA.Marland Kitchen

## 2018-09-12 NOTE — Discharge Instructions (Signed)
Return to the ER for new, worsening, or persistent pain, swelling, weakness or numbness, or any other new or worsening symptoms that concern you.

## 2018-10-08 ENCOUNTER — Other Ambulatory Visit: Payer: Self-pay

## 2018-10-08 ENCOUNTER — Encounter: Payer: Self-pay | Admitting: Emergency Medicine

## 2018-10-08 ENCOUNTER — Emergency Department
Admission: EM | Admit: 2018-10-08 | Discharge: 2018-10-10 | Disposition: A | Discharge status: Other Acute Inpatient Hospital | Payer: BLUE CROSS/BLUE SHIELD | Attending: Emergency Medicine | Admitting: Emergency Medicine

## 2018-10-08 ENCOUNTER — Emergency Department: Payer: BLUE CROSS/BLUE SHIELD

## 2018-10-08 DIAGNOSIS — T43222A Poisoning by selective serotonin reuptake inhibitors, intentional self-harm, initial encounter: Secondary | ICD-10-CM | POA: Diagnosis present

## 2018-10-08 DIAGNOSIS — Z950 Presence of cardiac pacemaker: Secondary | ICD-10-CM | POA: Diagnosis not present

## 2018-10-08 DIAGNOSIS — Z79899 Other long term (current) drug therapy: Secondary | ICD-10-CM | POA: Insufficient documentation

## 2018-10-08 DIAGNOSIS — F319 Bipolar disorder, unspecified: Secondary | ICD-10-CM | POA: Diagnosis not present

## 2018-10-08 DIAGNOSIS — F1220 Cannabis dependence, uncomplicated: Secondary | ICD-10-CM | POA: Diagnosis not present

## 2018-10-08 DIAGNOSIS — T43502A Poisoning by unspecified antipsychotics and neuroleptics, intentional self-harm, initial encounter: Secondary | ICD-10-CM | POA: Insufficient documentation

## 2018-10-08 DIAGNOSIS — R0689 Other abnormalities of breathing: Secondary | ICD-10-CM | POA: Insufficient documentation

## 2018-10-08 DIAGNOSIS — I10 Essential (primary) hypertension: Secondary | ICD-10-CM | POA: Insufficient documentation

## 2018-10-08 DIAGNOSIS — T450X2A Poisoning by antiallergic and antiemetic drugs, intentional self-harm, initial encounter: Secondary | ICD-10-CM | POA: Diagnosis not present

## 2018-10-08 DIAGNOSIS — T50902A Poisoning by unspecified drugs, medicaments and biological substances, intentional self-harm, initial encounter: Secondary | ICD-10-CM | POA: Diagnosis not present

## 2018-10-08 DIAGNOSIS — Z87891 Personal history of nicotine dependence: Secondary | ICD-10-CM | POA: Diagnosis not present

## 2018-10-08 DIAGNOSIS — F314 Bipolar disorder, current episode depressed, severe, without psychotic features: Secondary | ICD-10-CM

## 2018-10-08 LAB — CBC WITH DIFFERENTIAL/PLATELET
Abs Immature Granulocytes: 0.03 10*3/uL (ref 0.00–0.07)
Basophils Absolute: 0 10*3/uL (ref 0.0–0.1)
Basophils Relative: 1 %
Eosinophils Absolute: 0.6 10*3/uL — ABNORMAL HIGH (ref 0.0–0.5)
Eosinophils Relative: 11 %
HCT: 43.9 % (ref 39.0–52.0)
Hemoglobin: 14.6 g/dL (ref 13.0–17.0)
IMMATURE GRANULOCYTES: 1 %
LYMPHS PCT: 36 %
Lymphs Abs: 2.1 10*3/uL (ref 0.7–4.0)
MCH: 29.8 pg (ref 26.0–34.0)
MCHC: 33.3 g/dL (ref 30.0–36.0)
MCV: 89.6 fL (ref 80.0–100.0)
Monocytes Absolute: 0.4 10*3/uL (ref 0.1–1.0)
Monocytes Relative: 7 %
Neutro Abs: 2.6 10*3/uL (ref 1.7–7.7)
Neutrophils Relative %: 44 %
Platelets: 239 10*3/uL (ref 150–400)
RBC: 4.9 MIL/uL (ref 4.22–5.81)
RDW: 13.1 % (ref 11.5–15.5)
WBC: 5.8 10*3/uL (ref 4.0–10.5)
nRBC: 0 % (ref 0.0–0.2)

## 2018-10-08 LAB — BASIC METABOLIC PANEL
ANION GAP: 9 (ref 5–15)
BUN: 14 mg/dL (ref 6–20)
CHLORIDE: 111 mmol/L (ref 98–111)
CO2: 23 mmol/L (ref 22–32)
Calcium: 8.6 mg/dL — ABNORMAL LOW (ref 8.9–10.3)
Creatinine, Ser: 1.05 mg/dL (ref 0.61–1.24)
GFR calc Af Amer: >60 mL/min (ref >60)
GFR calc non Af Amer: >60 mL/min (ref >60)
Glucose, Bld: 124 mg/dL — ABNORMAL HIGH (ref 70–99)
Potassium: 3.7 mmol/L (ref 3.5–5.1)
Sodium: 143 mmol/L (ref 135–145)

## 2018-10-08 LAB — MAGNESIUM: Magnesium: 1.9 mg/dL (ref 1.7–2.4)

## 2018-10-08 LAB — ACETAMINOPHEN LEVEL
Acetaminophen (Tylenol), Serum: <10 ug/mL — ABNORMAL LOW (ref 10–30)
Acetaminophen (Tylenol), Serum: <10 ug/mL — ABNORMAL LOW (ref 10–30)

## 2018-10-08 LAB — ETHANOL: Alcohol, Ethyl (B): <10 mg/dL (ref <10)

## 2018-10-08 LAB — SALICYLATE LEVEL: Salicylate Lvl: <7 mg/dL (ref 2.8–30.0)

## 2018-10-08 MED ORDER — SODIUM CHLORIDE 0.9 % IV BOLUS
1000.0000 mL | Freq: Once | INTRAVENOUS | Status: AC
  Administered 2018-10-08: 1000 mL via INTRAVENOUS

## 2018-10-08 MED ORDER — LORAZEPAM 2 MG/ML IJ SOLN: 1.0000 mg | Freq: Once | INTRAMUSCULAR | Status: DC

## 2018-10-08 NOTE — ED Triage Notes (Signed)
Pt took overdose after sending suicide intent text to his family. Took approx 57 fluoxetine, 57 hydroxyzine, 27 seroquel, unknown amt of rexulti. (first 3 were refilled on 10/03/18 and last refilled in October.

## 2018-10-08 NOTE — ED Notes (Signed)
Spoke with Patty at poison control.  For all above meds - cns depression, hypotension and tachycardia, qtc prolongation (if >500 monitor K and Mag). rexulti can cause tremors and seizures. Hold charcoal d/t sedation and airway. Dr. Bevely Palmer aware.

## 2018-10-08 NOTE — ED Provider Notes (Signed)
[facility] Emergency Department Provider Note  ____________________________________________   I have reviewed the triage vital signs and the nursing notes.   HISTORY  Chief Complaint Ingestion and Suicidal   History limited by and level 5 caveat due to: Altered Mental Status   HPI Corey [name] is a 33 y.o. male who presents to the emergency department today after apparent overdose. History is limited second to patient somnolence. Patient states that he took an overdose of medication. He says that he did it in an attempt to harm himself. Patient unable to give any further history at this time.   Per medical record review patient has a history of bipolar disorder, schizoaffective disorder.  Past Medical History:  Diagnosis Date  . Bipolar 1 disorder (HCC)   . GERD (gastroesophageal reflux disease)   . HTN (hypertension)   . Schizoaffective disorder (HCC)   . 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 GERD (gastroesophageal reflux disease) 08/15/2018  . 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 Cannabis use disorder, moderate, dependence (HCC) 08/14/2018  . Psychosis (HCC) 06/29/2018  . Bipolar I disorder, current or most recent episode depressed, with psychotic features (HCC) 05/04/2018  . Syncope and collapse 04/29/2018  . Hypotension 04/29/2018  . Bradycardia 04/24/2018  . 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 Kidney stone 03/26/2018    Past Surgical History:  Procedure Laterality Date  . ESOPHAGOGASTRODUODENOSCOPY (EGD) WITH PROPOFOL N/A 04/26/2018   Procedure: ESOPHAGOGASTRODUODENOSCOPY (EGD) WITH PROPOFOL;  Surgeon: Wyline Mood, MD;  Location: [facility] ENDOSCOPY;  Service: Gastroenterology;  Laterality: N/A;  . KNEE ARTHROSCOPY    . PACEMAKER INSERTION Left 05/15/2018   Procedure: INSERTION PACEMAKER;  Surgeon: Marcina Millard, MD;  Location: [facility]  ORS;  Service: Cardiovascular;  Laterality: Left;    Prior to Admission medications   Medication Sig Start Date End Date Taking? Authorizing Provider  Brexpiprazole (REXULTI) 3 MG TABS Take 3 mg by mouth at bedtime.    [provider]  divalproex (DEPAKOTE ER) 500 MG 24 hr tablet Take 2 tablets (1,000 mg total) by mouth at bedtime. Patient taking differently: Take 1,500 mg by mouth at bedtime.  07/04/18   Hessie Knows, MD  Eszopiclone (ESZOPICLONE) 3 MG TABS Take 3 mg by mouth at bedtime. Take immediately before bedtime    [provider]  FLUoxetine (PROZAC) 20 MG capsule Take 3 capsules (60 mg total) by mouth daily. 08/17/18   [name], Braulio Conte B, MD  hydrOXYzine (VISTARIL) 25 MG capsule Take 25-50 mg by mouth 4 (four) times daily as needed.    [provider]  nitroGLYCERIN (NITROSTAT) 0.4 MG SL tablet Place 1 tablet (0.4 mg total) under the tongue every 5 (five) minutes as needed for chest pain. 08/16/18 08/16/19  [name], Ellin Goodie, MD    Allergies Penicillins and Sulfa antibiotics  Family History  Problem Relation Age of Onset  . Kidney cancer Neg Hx   . Bladder Cancer Neg Hx   . Prostate cancer Neg Hx     Social History Social History   Tobacco Use  . Smoking status: Former Games developer  . Smokeless tobacco: Current User    Types: Snuff  Substance Use Topics  . Alcohol use: No  . Drug use: Not Currently    Types: Marijuana    Review of Systems Unable to obtain secondary to patient's condition.  ____________________________________________   PHYSICAL EXAM:  VITAL SIGNS: ED Triage  Vitals  Enc Vitals Group     BP 10/08/18 1501 (!) 165/112     Pulse Rate 10/08/18 1501 (!) 102     Resp 10/08/18 1501 11     Temp 10/08/18 1501 98 F (36.7 C)     Temp Source 10/08/18 1501 Oral     SpO2 10/08/18 1501 97 %     Weight 10/08/18 1502 259 lb 14.8 oz (117.9 kg)     Height 10/08/18 1502 5\' 11"  (1.803 m)     Head Circumference --      Peak Flow  --      Pain Score 10/08/18 1502 0   Constitutional: Somnolent, awakens to verbal stimuli.  Eyes: Conjunctivae are normal.  ENT      Head: Normocephalic and atraumatic.      Nose: No congestion/rhinnorhea.      Mouth/Throat: Mucous membranes are moist.      Neck: No stridor. Hematological/Lymphatic/Immunilogical: No cervical lymphadenopathy. Cardiovascular: Normal rate, regular rhythm.  No murmurs, rubs, or gallops.  Respiratory: Normal respiratory effort without tachypnea nor retractions. Breath sounds are clear and equal bilaterally. No wheezes/rales/rhonchi. Gastrointestinal: Soft and non tender. No rebound. No guarding.  Genitourinary: Deferred Musculoskeletal: Normal range of motion in all extremities. No lower extremity edema. Neurologic:  Somnolent. Awakens to verbal stimuli. Answers with one word.  Skin:  Skin is warm, dry and intact. No rash noted.  ____________________________________________    LABS (pertinent positives/negatives)  Acetaminophen, salicylate, ethanol below threshold CBC wbc 5.8, hgb 14.6, plt 239 BMP wnl except glu 124, ca 8.6  ____________________________________________   EKG  I, Phineas Semen, attending physician, personally viewed and interpreted this EKG  EKG Time: 1502 Rate: 99 Rhythm: sinus rhythm Axis: left axis deviation Intervals: qtc 511 QRS: narrow ST changes: no st elevation Impression: abnormal ekg  I, Phineas Semen, attending physician, personally viewed and interpreted this EKG  EKG Time: 1917 Rate: 65 Rhythm: sinus rhythm Axis: left axis deviation Intervals: qtc 498 QRS: narrow ST changes: no st elevation Impression: abnormal ekg  ____________________________________________    RADIOLOGY  CXR No acute findings  ____________________________________________   PROCEDURES  Procedures  ____________________________________________   INITIAL IMPRESSION / ASSESSMENT AND PLAN / ED COURSE  Pertinent labs  & imaging results that were available during my care of the patient were reviewed by me and considered in my medical decision making (see chart for details).   Patient presented to the emergency department today after intentional overdose of multiple medications and attempt to harm himself.  Poison control was contacted.  At this point they recommended symptomatic management.  Patient's blood work without any concerning electrolyte abnormality.  Frequent checks study showed patient was somnolent however he would wake up to verbal stimuli.  Patient's oxygen level did start to drop after a episode of emesis.  Chest x-ray was obtained which not show any acute abnormality.  At this point would have patient has some aspect of sleep apnea.  Patient was placed under involuntary commitment.  Will have psychiatry evaluate.   ____________________________________________   FINAL CLINICAL IMPRESSION(S) / ED DIAGNOSES  Final diagnoses:  Intentional drug overdose, initial encounter [facility])     Note: This dictation was prepared with Dragon dictation. Any transcriptional errors that result from this process are unintentional     Phineas Semen, MD 10/08/18 2348

## 2018-10-08 NOTE — ED Notes (Signed)
Pt currently lying in bed asleep. Nothing needed from staff at this time

## 2018-10-08 NOTE — ED Notes (Signed)
Pt urinated and vomited on self. This EDT and RN Ann, cleaned the patient up and sat him upright. Pt now has on a clean gown with fresh linens. Nothing needed from staff at this time. Pt can be aroused  and states he is is comfortable.

## 2018-10-08 NOTE — ED Notes (Signed)
O2 sats down to 89% Dr Derrill Kay informed. Pt placed on O2 at 2l/min via [place].

## 2018-10-08 NOTE — ED Notes (Signed)
Pt vomited moderate amount of yellowish thick emesis. Pt turned to side and suctioned. Dr Derrill Kay informed that pt vomited and will give new orders. Pt is still very sleepy but arousable.

## 2018-10-09 ENCOUNTER — Emergency Department: Payer: BLUE CROSS/BLUE SHIELD

## 2018-10-09 DIAGNOSIS — T50902A Poisoning by unspecified drugs, medicaments and biological substances, intentional self-harm, initial encounter: Secondary | ICD-10-CM | POA: Diagnosis not present

## 2018-10-09 DIAGNOSIS — F314 Bipolar disorder, current episode depressed, severe, without psychotic features: Secondary | ICD-10-CM | POA: Diagnosis not present

## 2018-10-09 LAB — URINE DRUG SCREEN, QUALITATIVE (ARMC ONLY)
Amphetamines, Ur Screen: NOT DETECTED
Barbiturates, Ur Screen: NOT DETECTED
Benzodiazepine, Ur Scrn: POSITIVE — AB
Cannabinoid 50 Ng, Ur ~~LOC~~: NOT DETECTED
Cocaine Metabolite,Ur ~~LOC~~: NOT DETECTED
MDMA (Ecstasy)Ur Screen: NOT DETECTED
Methadone Scn, Ur: NOT DETECTED
Opiate, Ur Screen: NOT DETECTED
Phencyclidine (PCP) Ur S: NOT DETECTED
TRICYCLIC, UR SCREEN: POSITIVE — AB

## 2018-10-09 LAB — MAGNESIUM
MAGNESIUM: 1.5 mg/dL — AB (ref 1.7–2.4)
Magnesium: 1.7 mg/dL (ref 1.7–2.4)
Magnesium: 3.1 mg/dL — ABNORMAL HIGH (ref 1.7–2.4)

## 2018-10-09 LAB — COMPREHENSIVE METABOLIC PANEL
ALT: 16 U/L (ref 0–44)
AST: 18 U/L (ref 15–41)
Albumin: 3.5 g/dL (ref 3.5–5.0)
Alkaline Phosphatase: 52 U/L (ref 38–126)
Anion gap: 11 (ref 5–15)
BUN: 11 mg/dL (ref 6–20)
CO2: 26 mmol/L (ref 22–32)
Calcium: 8.7 mg/dL — ABNORMAL LOW (ref 8.9–10.3)
Chloride: 105 mmol/L (ref 98–111)
Creatinine, Ser: 1.16 mg/dL (ref 0.61–1.24)
GFR calc Af Amer: >60 mL/min (ref >60)
Glucose, Bld: 101 mg/dL — ABNORMAL HIGH (ref 70–99)
Potassium: 3.7 mmol/L (ref 3.5–5.1)
Sodium: 142 mmol/L (ref 135–145)
Total Bilirubin: 0.8 mg/dL (ref 0.3–1.2)
Total Protein: 6.7 g/dL (ref 6.5–8.1)

## 2018-10-09 LAB — POTASSIUM: POTASSIUM: 3.9 mmol/L (ref 3.5–5.1)

## 2018-10-09 MED ORDER — PANTOPRAZOLE SODIUM 40 MG PO TBEC
40.0000 mg | DELAYED_RELEASE_TABLET | Freq: Every day | ORAL | Status: DC
  Administered 2018-10-09 – 2018-10-10 (×2): 40 mg via ORAL
  Filled 2018-10-09 (×2): qty 1

## 2018-10-09 MED ORDER — MAGNESIUM SULFATE 2 GM/50ML IV SOLN
2.0000 g | Freq: Once | INTRAVENOUS | Status: AC
  Administered 2018-10-09: 2 g via INTRAVENOUS
  Filled 2018-10-09: qty 50

## 2018-10-09 MED ORDER — CALCIUM GLUCONATE-NACL 1-0.675 GM/50ML-% IV SOLN
1.0000 g | Freq: Once | INTRAVENOUS | Status: AC
  Administered 2018-10-09: 1000 mg via INTRAVENOUS
  Filled 2018-10-09: qty 50

## 2018-10-09 MED ORDER — SODIUM CHLORIDE 0.9 % IV SOLN: 1.0000 g | Freq: Once | INTRAVENOUS | Status: DC

## 2018-10-09 MED ORDER — ALBUTEROL SULFATE (2.5 MG/3ML) 0.083% IN NEBU
2.5000 mg | INHALATION_SOLUTION | Freq: Once | RESPIRATORY_TRACT | Status: AC
  Administered 2018-10-09: 2.5 mg via RESPIRATORY_TRACT
  Filled 2018-10-09: qty 3

## 2018-10-09 MED ORDER — SODIUM CHLORIDE 0.9 % IV SOLN
1.0000 g | Freq: Once | INTRAVENOUS | Status: DC
  Filled 2018-10-09 (×2): qty 10

## 2018-10-09 NOTE — ED Notes (Signed)
IVC/Consult completed/ Pending placement 

## 2018-10-09 NOTE — ED Notes (Signed)
X-ray at bedside

## 2018-10-09 NOTE — ED Notes (Signed)
Verlon Au, RN from [employer] called for update on pt. Recommends "optimize potassium, magnesium, and calcium to help reduce the QTc (on last EKG)". Dr Manson Passey informed, no new orders received. Will continue to monitor.

## 2018-10-09 NOTE — ED Provider Notes (Signed)
Will replete calcium and magnesium   Emily Filbert, MD 10/09/18 1349

## 2018-10-09 NOTE — ED Notes (Addendum)
Denies SI or HI at this time, states he was upset due to a situation with his children visiting.

## 2018-10-09 NOTE — ED Notes (Signed)
Labs sent

## 2018-10-09 NOTE — ED Notes (Signed)
EKG repeat per RN Dewayne Hatch

## 2018-10-09 NOTE — ED Provider Notes (Signed)
-----------------------------------------   6:50 PM on 10/09/2018 -----------------------------------------  I took over care on this patient from Dr. Mayford Knife.  The patient presented yesterday evening after intentional overdose of multiple medications.  He had QT prolongation and electrolytes were repleted.  He was somnolent for some time although now is more alert.  The patient is pending admission to psychiatry when he is medically cleared.  I discussed his case with Dr. Viviano Simas from psychiatry who confirmed the plan.  However, the patient was borderline hypoxic especially when asleep.  He has somewhat coarse breath sounds and a wet sounding cough.  Although he had a chest x-ray at last night, I repeated it to determine if he had any evidence of aspiration, developing pneumonia, or other acute pulmonary issue.  I also gave a nebulizer treatment.  The chest x-ray shows no acute findings.  The patient remained stable with O2 saturation in the low to mid 90s on 2 L by nasal cannula.  Repeat EKG shows improving QTc.  Overall I suspect that the patient's borderline hypoxia is related to prolonged effects of the medication overdose.  He is otherwise clinically improving.  At this time I do not see any specific benefit to admitting him medically and I think it would be reasonable to continue to observe him in the emergency department until he is stable enough to go to psychiatry.  If he has any clinical deterioration we will reassess and then consider medical admission at that time.  ED ECG REPORT I, Dionne Bucy, the attending physician, personally viewed and interpreted this ECG.  Date: 10/09/2018 EKG Time: 1848 Rate: 72 Rhythm: normal sinus rhythm QRS Axis: Borderline left axis Intervals: QTc 502 ST/T Wave abnormalities: Nonspecific T wave flattening diffusely Narrative Interpretation: no evidence of acute ischemia; improving QTc when compared to EKGs from earlier  today   ----------------------------------------- 12:11 AM on 10/10/2018 -----------------------------------------  Patient continues to be sleeping comfortably with stable vital signs.  O2 saturation is now improved.  I anticipate that the patient will be medically cleared in the morning and appropriate for psychiatry admission.  I signed him out to the oncoming physician Dr. Manson Passey.   Dionne Bucy, MD 10/10/18 [phone]

## 2018-10-09 NOTE — ED Notes (Signed)
Pt alert at this time, he denies pain, but continues to endorse a depressed mood and suicidal thoughts, but does not disclose a plan at this time. Pt encouraged to continue to report such thoughts and agreed. Will continue to monitor for care and safety.

## 2018-10-09 NOTE — ED Provider Notes (Signed)
EKG does reveal some QT prolongation.  I will recheck labs including electrolytes and reevaluate.   Emily Filbert, MD 10/09/18 [phone]

## 2018-10-09 NOTE — ED Notes (Signed)
Beth from [employer] recommended repeat EKG and maximizing potassium and magnesium. Mayford Knife, MD made aware.

## 2018-10-09 NOTE — BH Assessment (Addendum)
Assessment Note  Corey [name] is an 33 y.o. male who presents to the ER due to an intentional overdose of several of his medications. He ingested; approximately 57 Fluoxetine, 57 Hydroxyzine, 27 Seroquel, unknown amt of Rexulti. (first 3 were refilled on 10/03/18 and last refilled in October.  He states, he was upset because his children didn't visit while they were in town. He also reports of hearing voices but they are controlled due to the medications. He admits this was an attempt to end his life.  During the interview, the patient was calm, cooperative and pleasant. He was lethargic but able to answer questions.  He was able to provide appropriate answers to the questions. He denies HI and V/H.  Diagnosis: Depression  Past Medical History:  Past Medical History:  Diagnosis Date  . Bipolar 1 disorder (HCC)   . GERD (gastroesophageal reflux disease)   . HTN (hypertension)   . Schizoaffective disorder (HCC)   . 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 ESOPHAGOGASTRODUODENOSCOPY (EGD) WITH PROPOFOL N/A 04/26/2018   Procedure: ESOPHAGOGASTRODUODENOSCOPY (EGD) WITH PROPOFOL;  Surgeon: Wyline Mood, MD;  Location[facility] ENDOSCOPY;  Service: Gastroenterology;  Laterality: N/A;  . KNEE ARTHROSCOPY    . PACEMAKER INSERTION Left 05/15/2018   Procedure: INSERTION PACEMAKER;  Surgeon: Marcina Millard, MD;  Location: [facility] ORS;  Service: Cardiovascular;  Laterality: Left;    Family History:  Family History  Problem Relation Age of Onset  . Kidney cancer Neg Hx   . Bladder Cancer Neg Hx   . Prostate cancer Neg Hx     Social History:  reports that he has quit smoking. His smokeless tobacco use includes snuff. He reports previous drug use. Drug: Marijuana. He reports that he does not drink alcohol.  Additional Social History:  Alcohol / Drug Use Pain Medications: See PTA Prescriptions: See  PTA Over the Counter: See PTA History of alcohol / drug use?: Yes Longest period of sobriety (when/how long): Unable to quantify Substance #1 Name of Substance 1: Cocaine  CIWA: CIWA-Ar BP: 118/69 Pulse Rate: 75 COWS:    Allergies:  Allergies  Allergen Reactions  . Penicillins Hives    Has patient had a PCN reaction causing immediate rash, facial/tongue/throat swelling, SOB or lightheadedness with hypotension: Yes Has patient had a PCN reaction causing severe rash involving mucus membranes or skin necrosis: No Has patient had a PCN reaction that required hospitalization: No Has patient had a PCN reaction occurring within the last 10 years: No If all of the above answers are "NO", then may proceed with Cephalosporin use.   . Sulfa Antibiotics Hives    Home Medications: (Not in a hospital admission)   OB/GYN Status:  No LMP for male patient.  General Assessment Data Location of Assessment: [facility] ED TTS Assessment: In system Is this a Tele or Face-to-Face Assessment?: Face-to-Face Is this an Initial Assessment or a Re-assessment for this encounter?: Initial Assessment Language Other than English: No Living Arrangements: Other (Comment)(Private Home) What gender do you identify as?: Male Marital status: Single Pregnancy Status: No Living Arrangements: Alone Can pt return to current living arrangement?: Yes Admission Status: Involuntary Petitioner: Other Is patient capable of signing voluntary admission?: No Referral Source: Self/Family/Friend Insurance type: BCBS  Medical Screening Exam [facility] Walk-in ONLY) Medical Exam completed: Yes  Crisis Care Plan Living Arrangements: Alone Name of Psychiatrist: [facility]  Name of Therapist: [facility]  Education Status Is patient currently in school?: No Is the patient employed, unemployed or receiving disability?: Unemployed  Risk to self with the past 6 months Suicidal Ideation: Yes-Currently Present Has  patient been a risk to self within the past 6 months prior to admission? : Yes Suicidal Intent: Yes-Currently Present Has patient had any suicidal intent within the past 6 months prior to admission? : Yes Is patient at risk for suicide?: Yes Suicidal Plan?: Yes-Currently Present Has patient had any suicidal plan within the past 6 months prior to admission? : Yes Specify Current Suicidal Plan: Overdose Access to Means: Yes Specify Access to Suicidal Means: Overdose Medications What has been your use of drugs/alcohol within the last 12 months?: Cocaine Previous Attempts/Gestures: Yes How many times?: 1 Other Self Harm Risks: Drug use Triggers for Past Attempts: Family contact, Other (Comment) Intentional Self Injurious Behavior: None Comment - Self Injurious Behavior: Drug use Family Suicide History: No Recent stressful life event(s): Other (Comment), Conflict (Comment) Persecutory voices/beliefs?: Yes Depression: Yes Depression Symptoms: Isolating, Fatigue, Feeling worthless/self pity, Loss of interest in usual pleasures, Guilt Substance abuse history and/or treatment for substance abuse?: Yes Suicide prevention information given to non-admitted patients: Not applicable  Risk to Others within the past 6 months Homicidal Ideation: No Does patient have any lifetime risk of violence toward others beyond the six months prior to admission? : No Thoughts of Harm to Others: No Current Homicidal Intent: No Current Homicidal Plan: No Access to Homicidal Means: No Identified Victim: Reports of none History of harm to others?: No Assessment of Violence: None Noted Violent Behavior Description: Reports of none Does patient have access to weapons?: No Criminal Charges Pending?: No Does patient have a court date: No Is patient on probation?: No  Psychosis Hallucinations: Auditory Delusions: None noted  Mental Status Report Appearance/Hygiene: Unremarkable, In scrubs Eye Contact:  Poor Motor Activity: Unable to assess(Patient laying in the bed) Speech: Unremarkable, Logical/coherent Level of Consciousness: Alert Mood: Anxious, Pleasant Affect: Flat, Depressed Anxiety Level: Minimal Thought Processes: Coherent, Relevant Judgement: Partial Orientation: Person, Place, Time, Situation, Appropriate for developmental age Obsessive Compulsive Thoughts/Behaviors: Minimal  Cognitive Functioning Concentration: Normal Memory: Recent Intact, Remote Intact Is patient IDD: No Insight: Poor Impulse Control: Poor Appetite: Good Have you had any weight changes? : Gain Amount of the weight change? (lbs): 10 lbs Sleep: No Change Total Hours of Sleep: 8 Vegetative Symptoms: None  ADLScreening [facility] Assessment Services) Patient's cognitive ability adequate to safely complete daily activities?: Yes Patient able to express need for assistance with ADLs?: Yes Independently performs ADLs?: Yes (appropriate for developmental age)  Prior Inpatient Therapy Prior Inpatient Therapy: Yes Prior Therapy Dates: 08/2018 & 06/2018 Prior Therapy Facilty/Provider(s): [facility] Reason for Treatment: PSYCHOSIS  Prior Outpatient Therapy Prior Outpatient Therapy: Yes Prior Therapy Dates: Current Prior Therapy Facilty/Provider(s): [facility] Reason for Treatment: Bipolar  Does patient have an ACCT team?: No Does patient have Intensive In-House Services?  : No Does patient have [facility] services? : No Does patient have P4CC services?: No  ADL Screening (condition at time of admission) Patient's cognitive ability adequate to safely complete daily activities?: Yes Is the patient deaf or have difficulty hearing?: No Does the patient have difficulty seeing, even when wearing glasses/contacts?: No Does the patient have difficulty concentrating, remembering, or making decisions?: No Patient able to express need for assistance with ADLs?: Yes Does the patient have difficulty dressing or  bathing?: No Independently performs ADLs?: Yes (appropriate for developmental age) Does the patient  have difficulty walking or climbing stairs?: No Weakness of Legs: None Weakness of Arms/Hands: None  Home Assistive Devices/Equipment Home Assistive Devices/Equipment: None  Therapy Consults (therapy consults require a physician order) PT Evaluation Needed: No OT Evalulation Needed: No SLP Evaluation Needed: No Abuse/Neglect Assessment (Assessment to be complete while patient is alone) Abuse/Neglect Assessment Can Be Completed: Yes Physical Abuse: Denies Verbal Abuse: Denies Sexual Abuse: Denies Exploitation of patient/patient's resources: Denies Self-Neglect: Denies Values / Beliefs Cultural Requests During Hospitalization: None Spiritual Requests During Hospitalization: None Consults Spiritual Care Consult Needed: No Social Work Consult Needed: No [employer]chant [employer] officer (For Healthcare) Does Patient Have a Medical Advance Directive?: No Would patient like information on creating a medical advance directive?: No - Patient declined       Child/Adolescent Assessment Running Away Risk: Denies(Patient is an adult)  Disposition:  Disposition Initial Assessment Completed for this Encounter: Yes  On Site Evaluation by:   Reviewed with Physician:    Lilyan Gilford MS, LCAS, LPMHCA, NCC, CCSI Therapeutic Triage Specialist 10/09/2018 7:37 PM

## 2018-10-09 NOTE — ED Notes (Signed)
ED Provider at bedside. 

## 2018-10-09 NOTE — ED Notes (Signed)
Wet brief taken off of patient. Pt clean and dry at this time. Urinal at bedside. Pt verbalized understanding of urinal and states that he will use it. C/o pain with movement. Remains in hospital gown.

## 2018-10-09 NOTE — ED Notes (Signed)
Psychiatrist at bedside. Turned off oxygen to see how patient tolerated. 90% on RA. Placed back on 2L [place] by this RN.

## 2018-10-09 NOTE — ED Notes (Signed)
Pt given dinner tray, sitting up in bed eating independently at this time.

## 2018-10-09 NOTE — ED Notes (Signed)
Pt observed resting quietly in bed. RR even and unlabored on 2Ls [place]. NSR noted on telemetry. No acute distress noted. Will continue to monitor for care and safety.

## 2018-10-09 NOTE — Consult Note (Signed)
[facility] Face-to-Face Psychiatry Consult   Reason for Consult:  Suicide attempt by overdose on Seroquel  Referring Physician:  Dr. Mayford Knife Patient Identification: Corey [name] MRN:  [id number] Principal Diagnosis: Overdose of medication, intentional self-harm, initial encounter [facility]) Diagnosis:  Principal Problem:   Overdose of medication, intentional self-harm, initial encounter [facility]) Active Problems:   Bipolar 1 disorder, depressed, severe (HCC)  Patient is seen, chart is reviewed.  Of note, patient had inpatient psychiatric admission January 13 through August 16, 2018 with command auditory hallucinations to kill himself in the context of depression related to his ex-wife moving with their children out of state.  Patient UDS at that time was positive for marijuana, and he had not been compliant with prescribed medication by his outpatient psychiatrist at the [facility]. Total Time spent with patient: 1 hour  Subjective:  "It doesn't matter"  HPI:  Corey [name] is a 33 y.o. male patient who presented to the ED after overdose in an attempt to harm himself.  On evaluation, patient is still sedated.  He does arouse to voice. Patient describes that this is his third suicide attempt since Thanksgiving 2019: First a day or 2 before Thanksgiving in which he was in an argument with sister's boyfriend and wrestled for a gun, and once he had possession of the gun he tried to shoot himself in the head; January he was cutting as a suicide attempt to hope physical pain could relieve mental pain.  Patient denies that he currently has access to a weapon other than a BB gun.  Patient continues to have ambivalence about being alive.  He denies HI.  He is not describing visual or auditory hallucinations at this time.  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 Chart review: Pt stated that he saw his friend "hang himself" when pt was a teenager. Since then, he said that he has "on and off" of "seeing" his dead friend everywhere.  He said that he did not tell anyone about it so he never got any trauma treatment.    Risk to Self:  Yes Risk to Others:  Possible Prior Inpatient Therapy:  Yes at [facility] November 2019 and January 2020 Prior Outpatient Therapy:   [facility]  Past Medical History:  Past Medical History:  Diagnosis Date  . Bipolar 1 disorder (HCC)   . GERD (gastroesophageal reflux disease)   . HTN (hypertension)   . Schizoaffective disorder (HCC)   . 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 ESOPHAGOGASTRODUODENOSCOPY (EGD) WITH PROPOFOL N/A 04/26/2018   Procedure: ESOPHAGOGASTRODUODENOSCOPY (EGD) WITH PROPOFOL;  Surgeon: Wyline Mood, MD;  Location: [facility] ENDOSCOPY;  Service: Gastroenterology;  Laterality: N/A;  . KNEE ARTHROSCOPY    . PACEMAKER INSERTION Left 05/15/2018   Procedure: INSERTION PACEMAKER;  Surgeon: Marcina Millard, MD;  Location: [facility] ORS;  Service: Cardiovascular;  Laterality: Left;   Family History:  Family History  Problem Relation Age of Onset  . Kidney cancer Neg Hx   . Bladder Cancer Neg Hx   . Prostate cancer Neg Hx    Family Psychiatric  History: none   Social History:  Social History   Substance and Sexual Activity  Alcohol Use No     Social History   Substance and  Sexual Activity  Drug Use Not Currently  . Types: Marijuana    Social History   Socioeconomic History  . Marital status: Legally Separated    Spouse name: Not on file  . Number of children: 3  . Years of education: Not on file  . Highest education level: Not on file  Occupational History  . Not on file  Social Needs  . Financial resource strain: Not hard at all   . Food insecurity:    Worry: Never true    Inability: Never true  . Transportation needs:    Medical: No    Non-medical: No  Tobacco Use  . Smoking status: Former Games developer  . Smokeless tobacco: Current User    Types: Snuff  Substance and Sexual Activity  . Alcohol use: No  . Drug use: Not Currently    Types: Marijuana  . Sexual activity: Not Currently  Lifestyle  . Physical activity:    Days per week: 7 days    Minutes per session: 60 min  . Stress: Not at all  Relationships  . Social connections:    Talks on phone: More than three times a week    Gets together: More than three times a week    Attends religious service: Never    Active member of club or organization: No    Attends meetings of clubs or organizations: Never    Relationship status: Separated  Other Topics Concern  . Not on file  Social History Narrative  . Not on file   Additional Social History:   Living alone. Last worked in August. Not working. Applied for disability after having a pacemaker placed in October 2020.  Allergies:   Allergies  Allergen Reactions  . Penicillins Hives    Has patient had a PCN reaction causing immediate rash, facial/tongue/throat swelling, SOB or lightheadedness with hypotension: Yes Has patient had a PCN reaction causing severe rash involving mucus membranes or skin necrosis: No Has patient had a PCN reaction that required hospitalization: No Has patient had a PCN reaction occurring within the last 10 years: No If all of the above answers are "NO", then may proceed with Cephalosporin use.   . Sulfa Antibiotics Hives    Labs:  Results for orders placed or performed during the hospital encounter of 10/08/18 (from the past 48 hour(s))  Urine Drug Screen, Qualitative ([facility] only)     Status: Abnormal   Collection Time: 10/08/18  4:34 AM  Result Value Ref Range   Tricyclic, Ur Screen POSITIVE (A) NONE DETECTED   Amphetamines, Ur Screen NONE DETECTED NONE DETECTED   MDMA  (Ecstasy)Ur Screen NONE DETECTED NONE DETECTED   Cocaine Metabolite,Ur [place] NONE DETECTED NONE DETECTED   Opiate, Ur Screen NONE DETECTED NONE DETECTED   Phencyclidine (PCP) Ur S NONE DETECTED NONE DETECTED   Cannabinoid 50 Ng, Ur [place] NONE DETECTED NONE DETECTED   Barbiturates, Ur Screen NONE DETECTED NONE DETECTED   Benzodiazepine, Ur Scrn POSITIVE (A) NONE DETECTED   Methadone Scn, Ur NONE DETECTED NONE DETECTED    Comment: (NOTE) Tricyclics + metabolites, urine    Cutoff 1000 ng/mL Amphetamines + metabolites, urine  Cutoff 1000 ng/mL MDMA (Ecstasy), urine              Cutoff 500 ng/mL Cocaine Metabolite, urine          Cutoff 300 ng/mL Opiate + metabolites, urine        Cutoff 300 ng/mL Phencyclidine (PCP), urine  Cutoff 25 ng/mL Cannabinoid, urine                 Cutoff 50 ng/mL Barbiturates + metabolites, urine  Cutoff 200 ng/mL Benzodiazepine, urine              Cutoff 200 ng/mL Methadone, urine                   Cutoff 300 ng/mL The urine drug screen provides only a preliminary, unconfirmed analytical test result and should not be used for non-medical purposes. Clinical consideration and professional judgment should be applied to any positive drug screen result due to possible interfering substances. A more specific alternate chemical method must be used in order to obtain a confirmed analytical result. Gas chromatography / mass spectrometry (GC/MS) is the preferred confirmat ory method. Performed at [facility], [address] [place], [place] [address]   CBC with Differential     Status: Abnormal   Collection Time: 10/08/18  3:06 PM  Result Value Ref Range   WBC 5.8 4.0 - 10.5 K/uL   RBC 4.90 4.22 - 5.81 MIL/uL   Hemoglobin 14.6 13.0 - 17.0 g/dL   HCT 62.1 30.8 - 65.7 %   MCV 89.6 80.0 - 100.0 fL   MCH 29.8 26.0 - 34.0 pg   MCHC 33.3 30.0 - 36.0 g/dL   RDW 84.6 96.2 - 95.2 %   Platelets 239 150 - 400 K/uL   nRBC 0.0 0.0 - 0.2 %   Neutrophils  Relative % 44 %   Neutro Abs 2.6 1.7 - 7.7 K/uL   Lymphocytes Relative 36 %   Lymphs Abs 2.1 0.7 - 4.0 K/uL   Monocytes Relative 7 %   Monocytes Absolute 0.4 0.1 - 1.0 K/uL   Eosinophils Relative 11 %   Eosinophils Absolute 0.6 (H) 0.0 - 0.5 K/uL   Basophils Relative 1 %   Basophils Absolute 0.0 0.0 - 0.1 K/uL   Immature Granulocytes 1 %   Abs Immature Granulocytes 0.03 0.00 - 0.07 K/uL    Comment: Performed at [facility], [address] [place], [place] [address]  Basic metabolic panel     Status: Abnormal   Collection Time: 10/08/18  3:06 PM  Result Value Ref Range   Sodium 143 135 - 145 mmol/L   Potassium 3.7 3.5 - 5.1 mmol/L   Chloride 111 98 - 111 mmol/L   CO2 23 22 - 32 mmol/L   Glucose, Bld 124 (H) 70 - 99 mg/dL   BUN 14 6 - 20 mg/dL   Creatinine, Ser 4.40 0.61 - 1.24 mg/dL   Calcium 8.6 (L) 8.9 - 10.3 mg/dL   GFR calc non Af Amer >60 >60 mL/min   GFR calc Af Amer >60 >60 mL/min   Anion gap 9 5 - 15    Comment: Performed at [facility], [address] [place], [place] [address]  Ethanol     Status: None   Collection Time: 10/08/18  3:06 PM  Result Value Ref Range   Alcohol, Ethyl (B) <10 <10 mg/dL    Comment: (NOTE) Lowest detectable limit for serum alcohol is 10 mg/dL. For medical purposes only. Performed at [facility], [address] [place], [place] [address]   Acetaminophen level     Status: Abnormal   Collection Time: 10/08/18  3:06 PM  Result Value Ref Range   Acetaminophen (Tylenol), Serum <10 (L) 10 - 30 ug/mL    Comment: (NOTE) Therapeutic  concentrations vary significantly. A range of 10-30 ug/mL  may be an effective concentration for many patients. However, some  are best treated at concentrations outside of this range. Acetaminophen concentrations >150 ug/mL at 4 hours after ingestion  and >50 ug/mL at 12 hours after ingestion are often associated with  toxic reactions. Performed at [facility], [address] [place], [place] [address]   Salicylate level     Status: None   Collection Time: 10/08/18  3:06 PM  Result Value Ref Range   Salicylate Lvl <7.0 2.8 - 30.0 mg/dL    Comment: Performed at [facility], [address] [place], [place] [address]  Magnesium     Status: None   Collection Time: 10/08/18  3:06 PM  Result Value Ref Range   Magnesium 1.9 1.7 - 2.4 mg/dL    Comment: Performed at [facility], [address] [place], [place] [address]  Acetaminophen level     Status: Abnormal   Collection Time: 10/08/18  7:26 PM  Result Value Ref Range   Acetaminophen (Tylenol), Serum <10 (L) 10 - 30 ug/mL    Comment: (NOTE) Therapeutic concentrations vary significantly. A range of 10-30 ug/mL  may be an effective concentration for many patients. However, some  are best treated at concentrations outside of this range. Acetaminophen concentrations >150 ug/mL at 4 hours after ingestion  and >50 ug/mL at 12 hours after ingestion are often associated with  toxic reactions. Performed at [facility], [address] [place], [place] [address]   Magnesium     Status: None   Collection Time: 10/09/18  3:27 AM  Result Value Ref Range   Magnesium 1.7 1.7 - 2.4 mg/dL    Comment: Performed at [facility], [address] [place], [place] [address]  Potassium     Status: None   Collection Time: 10/09/18  3:27 AM  Result Value Ref Range   Potassium 3.9 3.5 - 5.1 mmol/L    Comment: Performed at [facility], [address] [place], [place] [address]    Current Facility-Administered Medications  Medication Dose Route Frequency Provider Last Rate Last Dose  . LORazepam (ATIVAN) injection 1 mg  1 mg Intravenous Once Phineas Semen, MD       Current Outpatient Medications  Medication Sig Dispense Refill  . clonazePAM (KLONOPIN) 1 MG tablet Take 1 mg by mouth 2 (two) times daily as needed for anxiety.    . risperiDONE (RISPERDAL) 3  MG tablet Take 3 mg by mouth Nightly.    . Brexpiprazole (REXULTI) 3 MG TABS Take 3 mg by mouth at bedtime.    . divalproex (DEPAKOTE ER) 500 MG 24 hr tablet Take 2 tablets (1,000 mg total) by mouth at bedtime. (Patient taking differently: Take 1,500 mg by mouth at bedtime. ) 60 tablet 0  . Doxepin HCl 6 MG TABS Take 6 mg by mouth Nightly.    . Eszopiclone (ESZOPICLONE) 3 MG TABS Take 3 mg by mouth at bedtime. Take immediately before bedtime    . FLUoxetine (PROZAC) 20 MG capsule Take 3 capsules (60 mg total) by mouth daily. 60 capsule 3  . hydrOXYzine (VISTARIL) 25 MG capsule Take 25-50 mg by mouth 4 (four) times daily as needed.    . nitroGLYCERIN (NITROSTAT) 0.4 MG SL tablet Place 1 tablet (0.4 mg total) under the tongue every 5 (five) minutes as needed for chest pain. 10 tablet 0  . pantoprazole (PROTONIX) 40 MG tablet Take  40 mg by mouth daily.    . QUEtiapine (SEROQUEL) 100 MG tablet Take 100 mg by mouth Nightly.      Musculoskeletal: Strength & Muscle Tone: within normal limits Gait & Station: not assessed Patient leans: N/A  Psychiatric Specialty Exam: Physical Exam  Nursing note and vitals reviewed. Constitutional: He is oriented to person, place, and time. He appears well-developed and well-nourished. He appears distressed.  Eyes: EOM are normal.  Neck: Normal range of motion.  Cardiovascular: Normal rate and regular rhythm.  Respiratory: Effort normal. No respiratory distress.  Musculoskeletal: Normal range of motion.  Neurological: He is alert and oriented to person, place, and time.  Skin: Skin is warm.    Review of Systems  Constitutional: Negative.   Respiratory: Negative.   Cardiovascular: Negative.   Musculoskeletal: Negative.   Neurological: Negative.   Psychiatric/Behavioral: Positive for depression and suicidal ideas. Negative for hallucinations and substance abuse. The patient has insomnia.     Blood pressure 110/77, pulse 71, temperature 97.6 F (36.4 C),  temperature source Oral, resp. rate 17, height 5\' 11"  (1.803 m), weight 117.9 kg, SpO2 91 %.Body mass index is 36.25 kg/m.  General Appearance: Disheveled and has not been grooming beard  Eye Contact:  None  Speech:  Garbled  Volume:  Decreased  Mood:  Depressed and Hopeless  Affect:  Congruent  Thought Process:  Linear  Orientation:  Other:  person, place, month and year  Thought Content:  Logical and Hallucinations: denies  Suicidal Thoughts:  Yes.  with intent/plan  Homicidal Thoughts:  No  Memory:  good  Judgement:  Poor  Insight:  Present  Psychomotor Activity:  Decreased  Concentration:  Attention Span: Poor  Recall:  [employer] of Knowledge:  Fair  Language:  Fair  Akathisia:  No  Handed:  Right  AIMS (if indicated):     Assets:  Social Support  ADL's:  Impaired  Cognition:  Impaired,  Mild  Sleep:   sedated after overdose     Treatment Plan Summary: Corey [name] is a 33 y.o. male with a history of schizoaffective disorder who is at high risk for suicide following 3 reported attempts in the past 4 months.  Patient continues to be ambivalent about being alive.  Unable to ascertain at this time if he has been compliant with medications or what social support he has currently.  Daily contact with patient to assess and evaluate symptoms and progress in treatment and Medication management  Currently defer until medically stable.  Disposition: Recommend psychiatric Inpatient admission when medically cleared.  [name], MD 10/09/2018 9:40 AM

## 2018-10-10 LAB — MAGNESIUM: Magnesium: 2.5 mg/dL — ABNORMAL HIGH (ref 1.7–2.4)

## 2018-10-10 LAB — POTASSIUM: Potassium: 3.9 mmol/L (ref 3.5–5.1)

## 2018-10-10 NOTE — BH Assessment (Signed)
Patient has been accepted to [facility].  Patient assigned to [facility]. Accepting physician is Dr. Eilleen Kempf.  Call report to [phone].  Representative was Clydie Braun.   ER Staff is aware of it:  LouAnn, ER Secretary  Dr. Don Perking, ER MD  Vernona Rieger, Patient's Nurse

## 2018-10-10 NOTE — ED Notes (Signed)
Received call from [facility] they will try to transport today will be later and if they can't will be tomorrow.

## 2018-10-10 NOTE — ED Notes (Signed)
Pt denies any pain at this time.

## 2018-10-10 NOTE — ED Notes (Signed)
Called [facility] for transport to [facility]

## 2018-10-10 NOTE — ED Notes (Addendum)
Pt being monitored.  Rover on site w/33minute rounding checklist. Pt is calm and cooperative. NAD noted at this time. This RN will continue to monitor.

## 2018-10-10 NOTE — ED Notes (Signed)
Patient speaking with patient on phone. No further needs expressed at this time.

## 2018-10-10 NOTE — BH Assessment (Signed)
Referral information for Psychiatric Hospitalization faxed to;   [facility] [phone]),   . [facility] [phone])  . [place] [phone]),  . [place] [phone] or [phone])  . [facility] [phone]),   . [facility] [phone]),   . [facility] [phone] or [phone])  . [facility] [phone])

## 2018-10-10 NOTE — ED Notes (Signed)
ED TO INPATIENT HANDOFF REPORT  ED Nurse Name and Phone #: [phone] [phone]  S Name/Age/Gender Marguerita Merlesevin Dean Aaron 33 y.o. male Room/Bed: ED24A/ED24A  Code Status   Code Status: Prior  Home/SNF/Other Home Patient oriented to: self, place, time and situation Is this baseline? Yes   Triage Complete: Triage complete  Chief Complaint Overdose  Triage Note Pt took overdose after sending suicide intent text to his family. Took approx 57 fluoxetine, 57 hydroxyzine, 27 seroquel, unknown amt of rexulti. (first 3 were refilled on 10/03/18 and last refilled in October.   Allergies Allergies  Allergen Reactions  . Penicillins Hives    Has patient had a PCN reaction causing immediate rash, facial/tongue/throat swelling, SOB or lightheadedness with hypotension: Yes Has patient had a PCN reaction causing severe rash involving mucus membranes or skin necrosis: No Has patient had a PCN reaction that required hospitalization: No Has patient had a PCN reaction occurring within the last 10 years: No If all of the above answers are "NO", then may proceed with Cephalosporin use.   . Sulfa Antibiotics Hives    Level of Care/Admitting Diagnosis ED Disposition    ED Disposition Condition Comment   Transfer to Another Facility  The patient appears reasonably stabilized for transfer considering the current resources, flow, and capabilities available in the ED at this time, and I doubt any other [facility] requiring further screening and/or treatment in the ED prior to transfer is p resent.       B Medical/Surgery History Past Medical History:  Diagnosis Date  . Bipolar 1 disorder (HCC)   . GERD (gastroesophageal reflux disease)   . HTN (hypertension)   . Schizoaffective disorder (HCC)   . 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 Darrold JunkerParaschos   Past Surgical History:  Procedure Laterality Date  . ESOPHAGOGASTRODUODENOSCOPY (EGD) WITH PROPOFOL N/A 04/26/2018   Procedure:  ESOPHAGOGASTRODUODENOSCOPY (EGD) WITH PROPOFOL;  Surgeon: Wyline MoodAnna, Kiran, MD;  [facility];  Service: Gastroenterology;  Laterality: N/A;  . KNEE ARTHROSCOPY    . PACEMAKER INSERTION Left 05/15/2018   Procedure: INSERTION PACEMAKER;  Surgeon: Marcina MillardParaschos, Alexander, MD;  Location: [facility] ORS;  Service: Cardiovascular;  Laterality: Left;     A IV Location/Drains/Wounds Patient Lines/Drains/Airways Status   Active Line/Drains/Airways    Name:   Placement date:   Placement time:   Site:   Days:   Peripheral IV 10/08/18 Left Arm   10/08/18    1536    Arm   2   Peripheral IV 10/09/18 Right Antecubital   10/09/18    1515    Antecubital   1   Incision (Closed) 05/15/18 Chest Left   05/15/18    1309     148          Intake/Output Last 24 hours No intake or output data in the 24 hours ending 10/10/18 1627  Labs/Imaging Results for orders placed or performed during the hospital encounter of 10/08/18 (from the past 48 hour(s))  Acetaminophen level     Status: Abnormal   Collection Time: 10/08/18  7:26 PM  Result Value Ref Range   Acetaminophen (Tylenol), Serum <10 (L) 10 - 30 ug/mL    Comment: (NOTE) Therapeutic concentrations vary significantly. A range of 10-30 ug/mL  may be an effective concentration for many patients. However, some  are best treated at concentrations outside of this range. Acetaminophen concentrations >150 ug/mL at 4 hours after ingestion  and >50 ug/mL at 12 hours after ingestion are often associated with  toxic reactions. Performed at [facility], [address] [place], [place] [address]   Magnesium     Status: None   Collection Time: 10/09/18  3:27 AM  Result Value Ref Range   Magnesium 1.7 1.7 - 2.4 mg/dL    Comment: Performed at [facility], [address] [place], [place] [address]  Potassium     Status: None   Collection Time: 10/09/18  3:27 AM  Result Value Ref Range   Potassium 3.9 3.5 - 5.1 mmol/L    Comment: Performed  at [facility], [address] [place], [place] [address]  Comprehensive metabolic panel     Status: Abnormal   Collection Time: 10/09/18 12:17 PM  Result Value Ref Range   Sodium 142 135 - 145 mmol/L   Potassium 3.7 3.5 - 5.1 mmol/L   Chloride 105 98 - 111 mmol/L   CO2 26 22 - 32 mmol/L   Glucose, Bld 101 (H) 70 - 99 mg/dL   BUN 11 6 - 20 mg/dL   Creatinine, Ser 8.26 0.61 - 1.24 mg/dL   Calcium 8.7 (L) 8.9 - 10.3 mg/dL   Total Protein 6.7 6.5 - 8.1 g/dL   Albumin 3.5 3.5 - 5.0 g/dL   AST 18 15 - 41 U/L   ALT 16 0 - 44 U/L   Alkaline Phosphatase 52 38 - 126 U/L   Total Bilirubin 0.8 0.3 - 1.2 mg/dL   GFR calc non Af Amer >60 >60 mL/min   GFR calc Af Amer >60 >60 mL/min   Anion gap 11 5 - 15    Comment: Performed at [facility], [address], [place], [place] [address]  Magnesium     Status: Abnormal   Collection Time: 10/09/18 12:17 PM  Result Value Ref Range   Magnesium 1.5 (L) 1.7 - 2.4 mg/dL    Comment: Performed at [facility], [address], [place], [place] [address]  Magnesium     Status: Abnormal   Collection Time: 10/09/18  5:18 PM  Result Value Ref Range   Magnesium 3.1 (H) 1.7 - 2.4 mg/dL    Comment: Performed at [facility], [address], [place], [place] [address]  Magnesium     Status: Abnormal   Collection Time: 10/10/18  6:25 AM  Result Value Ref Range   Magnesium 2.5 (H) 1.7 - 2.4 mg/dL    Comment: Performed at [facility], [address] [place], [place] [address]  Potassium     Status: None   Collection Time: 10/10/18  6:25 AM  Result Value Ref Range   Potassium 3.9 3.5 - 5.1 mmol/L    Comment: Performed at [facility], [address] [place], [place] [address]   Dg Chest Portable 1 View  Result Date: 10/09/2018 CLINICAL DATA:  Cough and hypoxia EXAM: PORTABLE CHEST 1 VIEW COMPARISON:  October 08, 2018 FINDINGS: The heart size and mediastinal contours are within normal limits. Both  lungs are clear. The visualized skeletal structures are unremarkable. IMPRESSION: No active disease. Electronically Signed   By: Gerome Sam III M.D   On: 10/09/2018 18:12   Dg Chest Portable 1 View  Result Date: 10/08/2018 CLINICAL DATA:  Overdose.  Vomiting.  Possible aspiration. EXAM: PORTABLE CHEST 1 VIEW COMPARISON:  July 29, 2017 FINDINGS: The cardiomediastinal silhouette is stable. Stable pacemaker. No pneumothorax. No pulmonary nodules, masses, or focal infiltrates. IMPRESSION: No active disease. Electronically Signed   By: Gerome Sam III  M.D   On: 10/08/2018 22:43    Pending Labs Unresulted Labs (From admission, onward)   None      Vitals/Pain Today's Vitals   10/10/18 1345 10/10/18 1457 10/10/18 1500 10/10/18 1515  BP:   119/86   Pulse: 76  79 78  Resp:   16 15  Temp:      TempSrc:      SpO2: 99%  93% 96%  Weight:      Height:      PainSc:  0-No pain 0-No pain     Isolation Precautions No active isolations  Medications Medications  LORazepam (ATIVAN) injection 1 mg (1 mg Intravenous Not Given 10/08/18 2100)  pantoprazole (PROTONIX) EC tablet 40 mg (40 mg Oral Given 10/10/18 0956)  sodium chloride 0.9 % bolus 1,000 mL (0 mLs Intravenous Stopped 10/08/18 1816)  magnesium sulfate IVPB 2 g 50 mL (0 g Intravenous Stopped 10/09/18 1531)  calcium gluconate 1 g/ 50 mL sodium chloride IVPB (0 g Intravenous Stopped 10/09/18 1612)  albuterol (PROVENTIL) (2.5 MG/3ML) 0.083% nebulizer solution 2.5 mg (2.5 mg Nebulization Given 10/09/18 1732)    Mobility walks High fall risk   Focused Assessments  Pt is A/Ox4, NAD   R Recommendations: See Admitting Provider Note  Report given to:   Additional Notes:

## 2018-10-10 NOTE — ED Notes (Signed)
Patient sitting up in bed eating lunch. Oxygen removed from patient per MD request. Will monitor continuous oxygen saturation. Patient updated on plan of care.

## 2018-10-10 NOTE — Consult Note (Signed)
[facility] Face-to-Face Psychiatry Consult   Reason for Consult:  Suicide attempt by overdose on Seroquel  Referring Physician:  Dr. Mayford Knife Patient Identification: Corey [name] MRN:  [id number] Principal Diagnosis: Overdose of medication, intentional self-harm, initial encounter [facility]) Diagnosis:  Principal Problem:   Overdose of medication, intentional self-harm, initial encounter [facility]) Active Problems:   Bipolar 1 disorder, depressed, severe (HCC)  Patient is seen, chart is reviewed.  Of note, patient had inpatient psychiatric admission January 13 through August 16, 2018 with command auditory hallucinations to kill himself in the context of depression related to his ex-wife moving with their children out of state.  Patient UDS at that time was positive for marijuana, and he had not been compliant with prescribed medication by his outpatient psychiatrist at [facility]. Total Time spent with patient: 15 minutes  Subjective:  "It doesn't matter"  HPI:  Corey [name] is a 33 y.o. male patient who presented to the ED after overdose in an attempt to harm himself.  On 10/09/2018 initial evaluation, patient is still sedated.  He does arouse to voice. Patient describes that this is his third suicide attempt since Thanksgiving 2019: First a day or 2 before Thanksgiving in which he was in an argument with sister's boyfriend and wrestled for a gun, and once he had possession of the gun he tried to shoot himself in the head; January he was cutting as a suicide attempt to hope physical pain could relieve mental pain.  Patient denies that he currently has access to a weapon other than a BB gun.  Patient continues to have ambivalence about being alive.  He denies HI.  He is not describing visual or auditory hallucinations at this time.  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 Chart review: Pt stated that he saw his friend "hang himself" when pt was a teenager. Since then, he said that he has "on and off" of "seeing" his dead friend everywhere.  He said that he did not tell anyone about it so he never got any trauma treatment.     On re-evaluation 10/10/2018, patient reports that he is feeling "a little better.", however he states that he does not have much hope for the future.  He continues to endorse suicidal thoughts.  He denies HI, AVH.   Risk to Self: Suicidal Ideation: Yes-Currently Present Suicidal Intent: Yes-Currently Present Is patient at risk for suicide?: Yes Suicidal Plan?: Yes-Currently Present Specify Current Suicidal Plan: Overdose Access to Means: Yes Specify Access to Suicidal Means: Overdose Medications What has been your use of drugs/alcohol within the last 12 months?: Cocaine How many times?: 1 Other Self Harm Risks: Drug use Triggers for Past Attempts: Family contact, Other (Comment) Intentional Self Injurious Behavior: None Comment - Self Injurious Behavior: Drug useYes Risk to Others: Homicidal Ideation: No Thoughts of Harm to Others: No Current Homicidal Intent: No Current Homicidal Plan: No Access to Homicidal Means: No Identified Victim: Reports of none History of harm to others?: No Assessment of Violence: None Noted Violent Behavior Description: Reports of none Does patient have access to weapons?: No Criminal Charges Pending?: No Does patient have a court date: NoPossible Prior Inpatient Therapy: Prior Inpatient Therapy: Yes Prior Therapy Dates: 08/2018 & 06/2018 Prior Therapy Facilty/Provider(s): [facility] Reason for Treatment: PSYCHOSISYes at [facility] November 2019 and January 2020  Prior Outpatient Therapy: Prior Outpatient Therapy: Yes Prior Therapy Dates: Current Prior Therapy Facilty/Provider(s): [facility] Reason for  Treatment: Bipolar  Does patient have an ACCT team?: No Does patient have Intensive In-House Services?  : No Does patient have [facility] services? : No Does patient have P4CC services?: No [facility]  Past Medical History:  Past Medical History:  Diagnosis Date  . Bipolar 1 disorder (HCC)   . GERD (gastroesophageal reflux disease)   . HTN (hypertension)   . Schizoaffective disorder (HCC)   . 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 ESOPHAGOGASTRODUODENOSCOPY (EGD) WITH PROPOFOL N/A 04/26/2018   Procedure: ESOPHAGOGASTRODUODENOSCOPY (EGD) WITH PROPOFOL;  Surgeon: Wyline Mood, MD;  Location: [facility] ENDOSCOPY;  Service: Gastroenterology;  Laterality: N/A;  . KNEE ARTHROSCOPY    . PACEMAKER INSERTION Left 05/15/2018   Procedure: INSERTION PACEMAKER;  Surgeon: Marcina Millard, MD;  Location: [facility] ORS;  Service: Cardiovascular;  Laterality: Left;   Family History:  Family History  Problem Relation Age of Onset  . Kidney cancer Neg Hx   . Bladder Cancer Neg Hx   . Prostate cancer Neg Hx    Family Psychiatric  History: none   Social History:  Social History   Substance and Sexual Activity  Alcohol Use No     Social History   Substance and Sexual Activity  Drug Use Not Currently  . Types: Marijuana    Social History   Socioeconomic History  . Marital status: Legally Separated    Spouse name: Not on file  . Number of children: 3  . Years of education: Not on file  . Highest education level: Not on file  Occupational History  . Not on file  Social Needs  . Financial resource strain: Not hard at all  . Food insecurity:    Worry: Never true    Inability: Never true  . Transportation needs:    Medical: No    Non-medical: No  Tobacco Use  . Smoking status: Former Games developer  . Smokeless tobacco: Current User    Types: Snuff  Substance and Sexual Activity  . Alcohol use: No  .  Drug use: Not Currently    Types: Marijuana  . Sexual activity: Not Currently  Lifestyle  . Physical activity:    Days per week: 7 days    Minutes per session: 60 min  . Stress: Not at all  Relationships  . Social connections:    Talks on phone: More than three times a week    Gets together: More than three times a week    Attends religious service: Never    Active member of club or organization: No    Attends meetings of clubs or organizations: Never    Relationship status: Separated  Other Topics Concern  . Not on file  Social History Narrative  . Not on file   Additional Social History:   Living alone. Last worked in August. Not working. Applied for disability after having a pacemaker placed in October 2020.  Allergies:   Allergies  Allergen Reactions  . Penicillins Hives    Has patient had a PCN reaction causing immediate rash, facial/tongue/throat swelling, SOB or lightheadedness with hypotension: Yes Has patient had a PCN reaction causing severe rash involving mucus membranes or skin necrosis: No Has patient had a PCN reaction that required hospitalization: No Has patient had a PCN reaction occurring within the last 10 years:  No If all of the above answers are "NO", then may proceed with Cephalosporin use.   . Sulfa Antibiotics Hives    Labs:  Results for orders placed or performed during the hospital encounter of 10/08/18 (from the past 48 hour(s))  CBC with Differential     Status: Abnormal   Collection Time: 10/08/18  3:06 PM  Result Value Ref Range   WBC 5.8 4.0 - 10.5 K/uL   RBC 4.90 4.22 - 5.81 MIL/uL   Hemoglobin 14.6 13.0 - 17.0 g/dL   HCT 16.1 09.6 - 04.5 %   MCV 89.6 80.0 - 100.0 fL   MCH 29.8 26.0 - 34.0 pg   MCHC 33.3 30.0 - 36.0 g/dL   RDW 40.9 81.1 - 91.4 %   Platelets 239 150 - 400 K/uL   nRBC 0.0 0.0 - 0.2 %   Neutrophils Relative % 44 %   Neutro Abs 2.6 1.7 - 7.7 K/uL   Lymphocytes Relative 36 %   Lymphs Abs 2.1 0.7 - 4.0 K/uL    Monocytes Relative 7 %   Monocytes Absolute 0.4 0.1 - 1.0 K/uL   Eosinophils Relative 11 %   Eosinophils Absolute 0.6 (H) 0.0 - 0.5 K/uL   Basophils Relative 1 %   Basophils Absolute 0.0 0.0 - 0.1 K/uL   Immature Granulocytes 1 %   Abs Immature Granulocytes 0.03 0.00 - 0.07 K/uL    Comment: Performed at [facility], [address] [place], [place] [address]  Basic metabolic panel     Status: Abnormal   Collection Time: 10/08/18  3:06 PM  Result Value Ref Range   Sodium 143 135 - 145 mmol/L   Potassium 3.7 3.5 - 5.1 mmol/L   Chloride 111 98 - 111 mmol/L   CO2 23 22 - 32 mmol/L   Glucose, Bld 124 (H) 70 - 99 mg/dL   BUN 14 6 - 20 mg/dL   Creatinine, Ser 6.21 0.61 - 1.24 mg/dL   Calcium 8.6 (L) 8.9 - 10.3 mg/dL   GFR calc non Af Amer >60 >60 mL/min   GFR calc Af Amer >60 >60 mL/min   Anion gap 9 5 - 15    Comment: Performed at [facility], [address] [place], [place] [address]  Ethanol     Status: None   Collection Time: 10/08/18  3:06 PM  Result Value Ref Range   Alcohol, Ethyl (B) <10 <10 mg/dL    Comment: (NOTE) Lowest detectable limit for serum alcohol is 10 mg/dL. For medical purposes only. Performed at [facility], [address] [place], [place] [address]   Acetaminophen level     Status: Abnormal   Collection Time: 10/08/18  3:06 PM  Result Value Ref Range   Acetaminophen (Tylenol), Serum <10 (L) 10 - 30 ug/mL    Comment: (NOTE) Therapeutic concentrations vary significantly. A range of 10-30 ug/mL  may be an effective concentration for many patients. However, some  are best treated at concentrations outside of this range. Acetaminophen concentrations >150 ug/mL at 4 hours after ingestion  and >50 ug/mL at 12 hours after ingestion are often associated with  toxic reactions. Performed at [facility], [address] [place], [place] [address]   Salicylate level     Status: None   Collection Time: 10/08/18  3:06 PM   Result Value Ref Range   Salicylate Lvl <7.0 2.8 - 30.0 mg/dL    Comment: Performed at [facility], [address] [place], [place] [address]  Magnesium     Status: None   Collection Time: 10/08/18  3:06 PM  Result Value Ref Range   Magnesium 1.9 1.7 - 2.4 mg/dL    Comment: Performed at [facility], [address] [place], [place] [address]  Acetaminophen level     Status: Abnormal   Collection Time: 10/08/18  7:26 PM  Result Value Ref Range   Acetaminophen (Tylenol), Serum <10 (L) 10 - 30 ug/mL    Comment: (NOTE) Therapeutic concentrations vary significantly. A range of 10-30 ug/mL  may be an effective concentration for many patients. However, some  are best treated at concentrations outside of this range. Acetaminophen concentrations >150 ug/mL at 4 hours after ingestion  and >50 ug/mL at 12 hours after ingestion are often associated with  toxic reactions. Performed at [facility], [address] [place], [place] [address]   Magnesium     Status: None   Collection Time: 10/09/18  3:27 AM  Result Value Ref Range   Magnesium 1.7 1.7 - 2.4 mg/dL    Comment: Performed at [facility], [address] [place], [place] [address]  Potassium     Status: None   Collection Time: 10/09/18  3:27 AM  Result Value Ref Range   Potassium 3.9 3.5 - 5.1 mmol/L    Comment: Performed at [facility], [address] [place], [place] [address]  Comprehensive metabolic panel     Status: Abnormal   Collection Time: 10/09/18 12:17 PM  Result Value Ref Range   Sodium 142 135 - 145 mmol/L   Potassium 3.7 3.5 - 5.1 mmol/L   Chloride 105 98 - 111 mmol/L   CO2 26 22 - 32 mmol/L   Glucose, Bld 101 (H) 70 - 99 mg/dL   BUN 11 6 - 20 mg/dL   Creatinine, Ser 8.92 0.61 - 1.24 mg/dL   Calcium 8.7 (L) 8.9 - 10.3 mg/dL   Total Protein 6.7 6.5 - 8.1 g/dL   Albumin 3.5 3.5 - 5.0 g/dL   AST 18 15 - 41 U/L   ALT 16 0 - 44 U/L   Alkaline Phosphatase 52  38 - 126 U/L   Total Bilirubin 0.8 0.3 - 1.2 mg/dL   GFR calc non Af Amer >60 >60 mL/min   GFR calc Af Amer >60 >60 mL/min   Anion gap 11 5 - 15    Comment: Performed at [facility], [address] [place], [place] [address]  Magnesium     Status: Abnormal   Collection Time: 10/09/18 12:17 PM  Result Value Ref Range   Magnesium 1.5 (L) 1.7 - 2.4 mg/dL    Comment: Performed at [facility], [address] [place], [place] [address]  Magnesium     Status: Abnormal   Collection Time: 10/09/18  5:18 PM  Result Value Ref Range   Magnesium 3.1 (H) 1.7 - 2.4 mg/dL    Comment: Performed at [facility], [address], [place], [place] [address]  Magnesium     Status: Abnormal   Collection Time: 10/10/18  6:25 AM  Result Value Ref Range   Magnesium 2.5 (H) 1.7 - 2.4 mg/dL    Comment: Performed at [facility], [address] [place], [place] [address]  Potassium     Status: None   Collection Time: 10/10/18  6:25 AM  Result Value Ref Range   Potassium 3.9 3.5 - 5.1 mmol/L    Comment:  Performed at [facility], [address] [place], [place] [address]    Current Facility-Administered Medications  Medication Dose Route Frequency Provider Last Rate Last Dose  . LORazepam (ATIVAN) injection 1 mg  1 mg Intravenous Once Phineas Semen, MD      . pantoprazole (PROTONIX) EC tablet 40 mg  40 mg Oral Daily [name], MD   40 mg at 10/10/18 [id number]   Current Outpatient Medications  Medication Sig Dispense Refill  . clonazePAM (KLONOPIN) 1 MG tablet Take 1 mg by mouth 2 (two) times daily as needed for anxiety.    . risperiDONE (RISPERDAL) 3 MG tablet Take 3 mg by mouth Nightly.    . Brexpiprazole (REXULTI) 3 MG TABS Take 3 mg by mouth at bedtime.    . divalproex (DEPAKOTE ER) 500 MG 24 hr tablet Take 2 tablets (1,000 mg total) by mouth at bedtime. (Patient taking differently: Take 1,500 mg by mouth at bedtime. ) 60 tablet 0  . Doxepin  HCl 6 MG TABS Take 6 mg by mouth Nightly.    . Eszopiclone (ESZOPICLONE) 3 MG TABS Take 3 mg by mouth at bedtime. Take immediately before bedtime    . FLUoxetine (PROZAC) 20 MG capsule Take 3 capsules (60 mg total) by mouth daily. 60 capsule 3  . hydrOXYzine (VISTARIL) 25 MG capsule Take 25-50 mg by mouth 4 (four) times daily as needed.    . nitroGLYCERIN (NITROSTAT) 0.4 MG SL tablet Place 1 tablet (0.4 mg total) under the tongue every 5 (five) minutes as needed for chest pain. 10 tablet 0  . pantoprazole (PROTONIX) 40 MG tablet Take 40 mg by mouth daily.    . QUEtiapine (SEROQUEL) 100 MG tablet Take 100 mg by mouth Nightly.      Musculoskeletal: Strength & Muscle Tone: within normal limits Gait & Station: not assessed Patient leans: N/A  Psychiatric Specialty Exam: Physical Exam  Nursing note and vitals reviewed. Constitutional: He is oriented to person, place, and time. He appears well-developed and well-nourished. He appears distressed.  Eyes: EOM are normal.  Neck: Normal range of motion.  Cardiovascular: Normal rate and regular rhythm.  Respiratory: Effort normal. No respiratory distress.  Musculoskeletal: Normal range of motion.  Neurological: He is alert and oriented to person, place, and time.  Skin: Skin is warm.    Review of Systems  Constitutional: Negative.   Respiratory: Negative.   Cardiovascular: Negative.   Musculoskeletal: Negative.   Neurological: Negative.   Psychiatric/Behavioral: Positive for depression and suicidal ideas. Negative for hallucinations and substance abuse. The patient has insomnia.     Blood pressure 111/68, pulse 67, temperature 98 F (36.7 C), temperature source Oral, resp. rate 13, height  (1.803 m), weight 117.9 kg, SpO2 98 %.Body mass index is 36.25 kg/m.  General Appearance: Disheveled and has not been grooming beard  Eye Contact:  Poor  Speech:  Garbled  Volume:  Decreased  Mood:  Depressed and Hopeless  Affect:  Congruent   Thought Process:  Linear  Orientation:  Other:  person, place, month and year  Thought Content:  Logical and Hallucinations: denies  Suicidal Thoughts:  Yes.  with intent/plan  Homicidal Thoughts:  No  Memory:  good  Judgement:  Poor  Insight:  Present  Psychomotor Activity:  Decreased  Concentration:  Attention Span: Poor  Recall:  [employer] of Knowledge:  Fair  Language:  Fair  Akathisia:  No  Handed:  Right  AIMS (if indicated):  Assets:  Social Support  ADL's:  Impaired  Cognition:  Impaired,  Mild  Sleep:   sedated after overdose     Treatment Plan Summary: Corey [name] is a 33 y.o. male with a history of schizoaffective disorder who is at high risk for suicide following 3 reported attempts in the past 4 months.  Patient continues to be ambivalent about being alive.  Unable to ascertain at this time if he has been compliant with medications or what social support he has currently.  Daily contact with patient to assess and evaluate symptoms and progress in treatment and Medication management  Currently defer until medically stable.  Disposition: Recommend psychiatric Inpatient admission when medically cleared.  [name], MD 10/10/2018 1:32 PM

## 2018-10-10 NOTE — ED Notes (Signed)
ED Provider at bedside. 

## 2018-12-12 IMAGING — CR DG CHEST 2V
1 series · 2 of 2 positions shown · non-contrast
Comparison: Radiograph 05/15/2018

CLINICAL DATA: Fatigue.  Recent pacemaker placement.

EXAM:
CHEST - 2 VIEW

[Series 1: dg chest 2 view · 0.14mm/px · 2 of 2 slices shown]
[im 1/2]
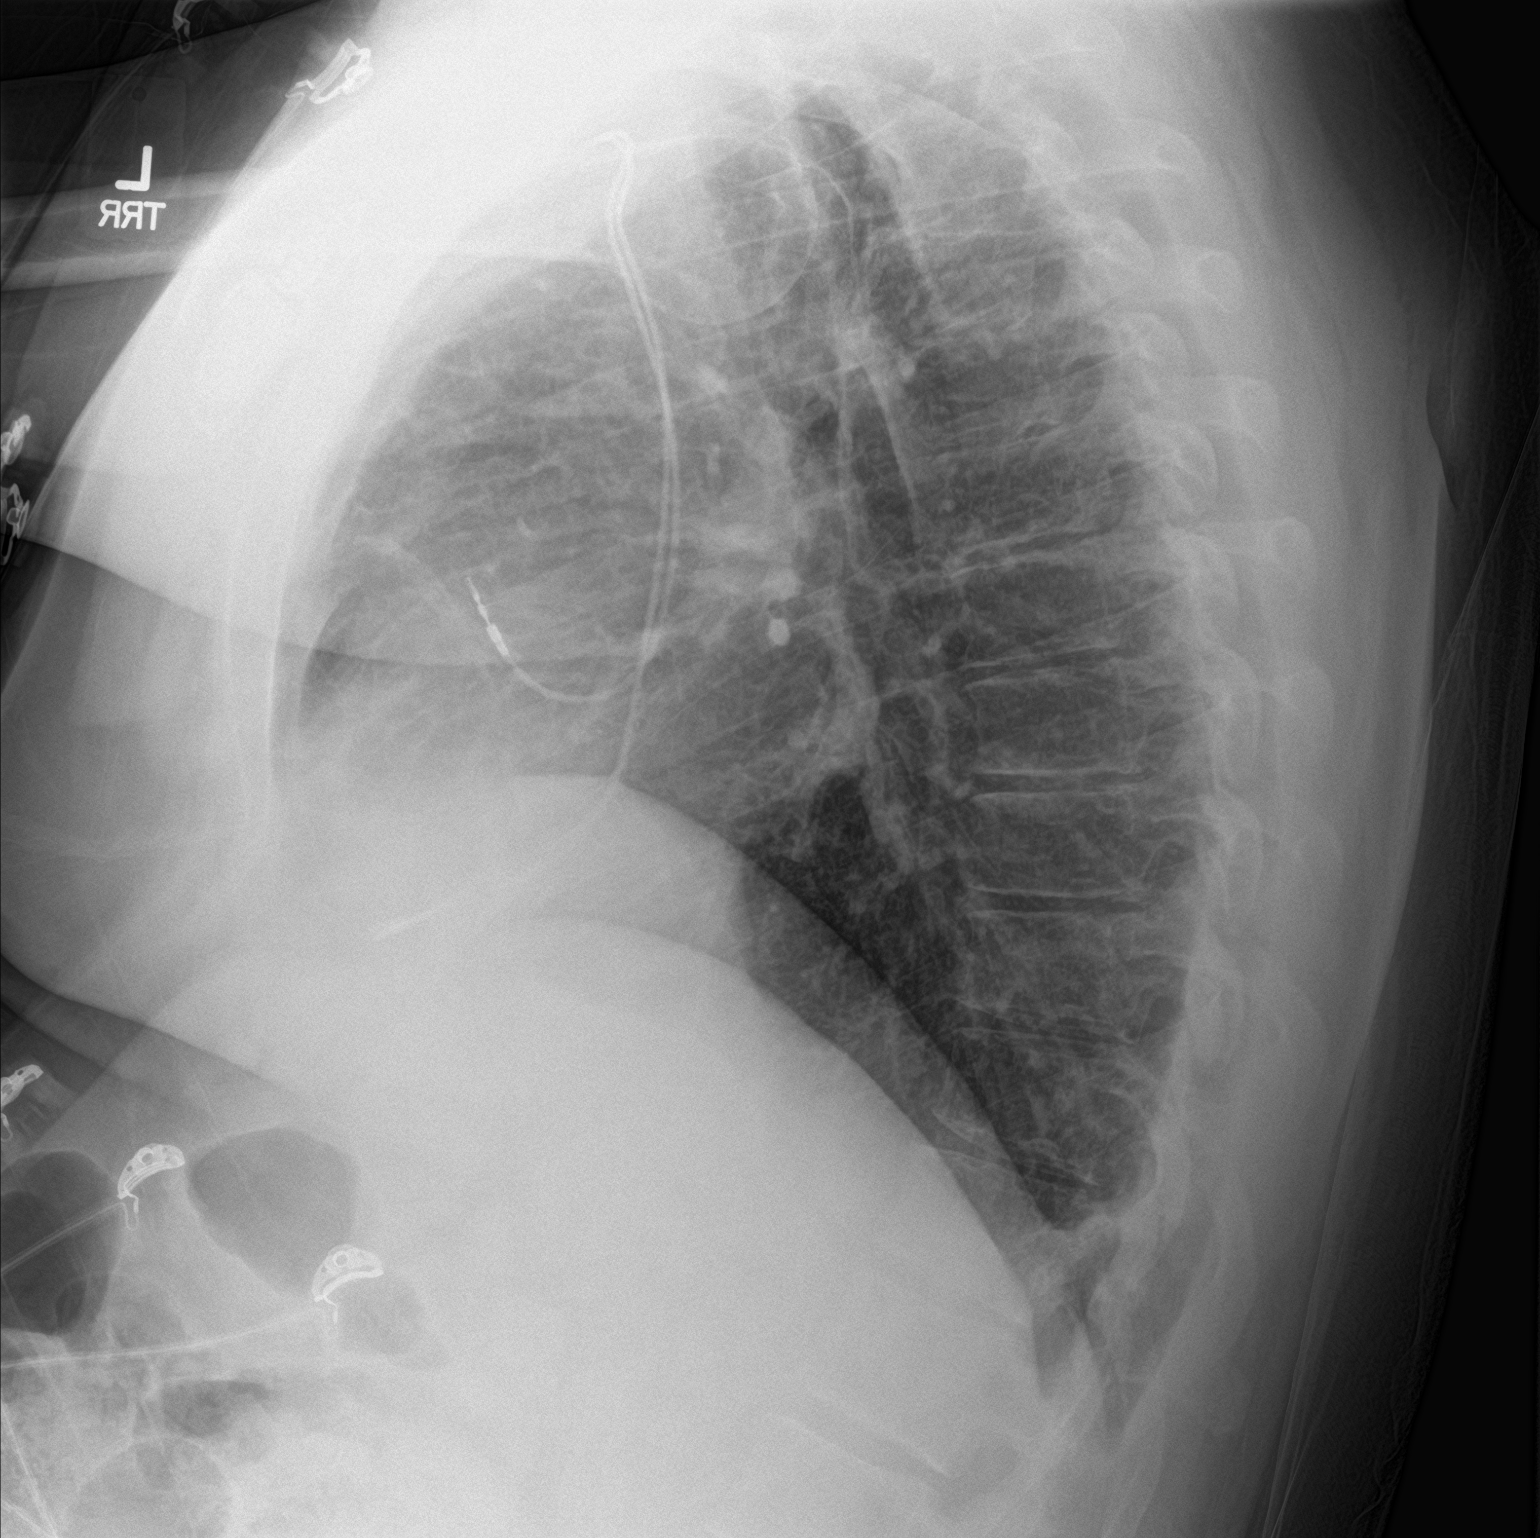
[im 2/2]
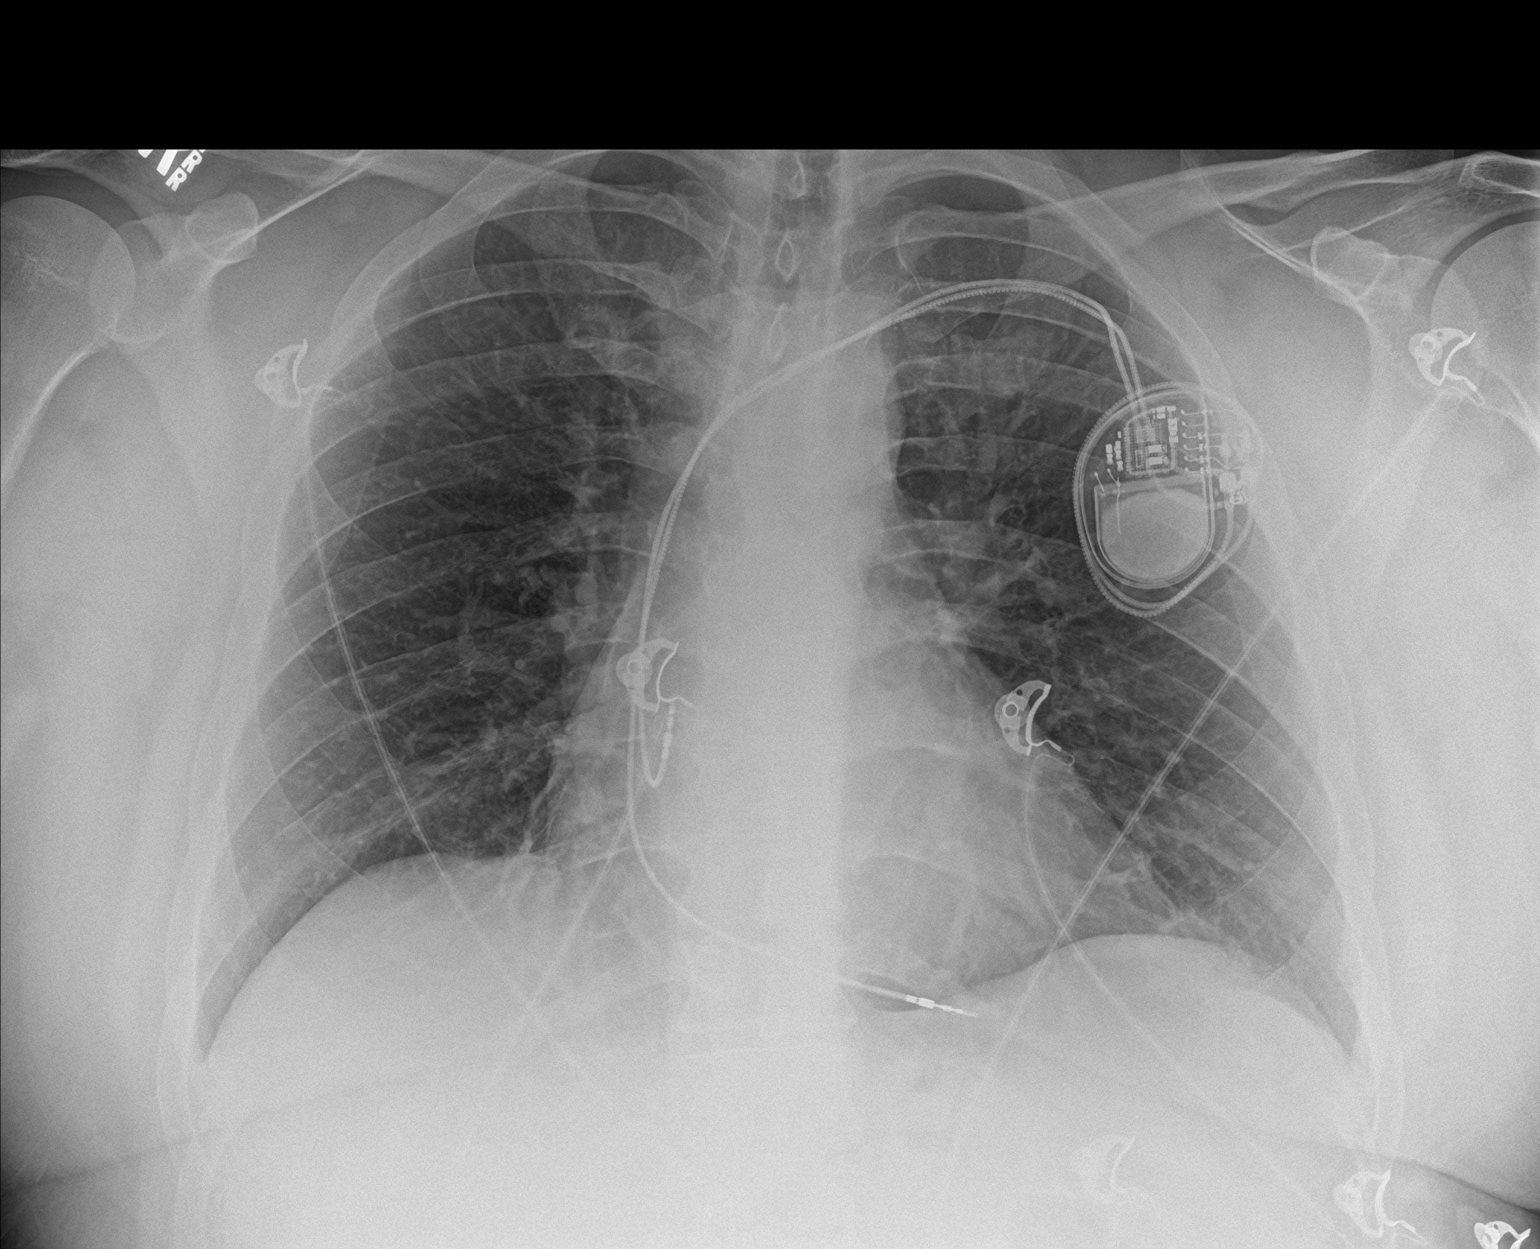

[2 of 2 positions shown; findings below may reference images not displayed]

FINDINGS: Dual lead left-sided pacemaker in place with leads projecting over
the right atrium and ventricle. The cardiomediastinal contours are
normal. The lungs are clear. Pulmonary vasculature is normal. No
consolidation, pleural effusion, or pneumothorax. No acute osseous
abnormalities are seen.
IMPRESSION: Left-sided pacemaker in place with intact leads. No acute pulmonary
process.

## 2018-12-28 IMAGING — CR DG CHEST 2V
1 series · 2 of 2 positions shown · non-contrast
Comparison: 05/28/2018

CLINICAL DATA: Pt ems from home for syncopal episode. Pt states he
has been feeling increased weakness and passing out frequently
within the last few weeks. Pt with hx of same with pacemaker placed
[DATE]. Pt also c/o neck pain, history of hypertension,
bradycardia. Former smoker.

EXAM:
CHEST - 2 VIEW

[Series 1: dg chest 2 view · 0.14mm/px · 2 of 2 slices shown]
[im 1/2]
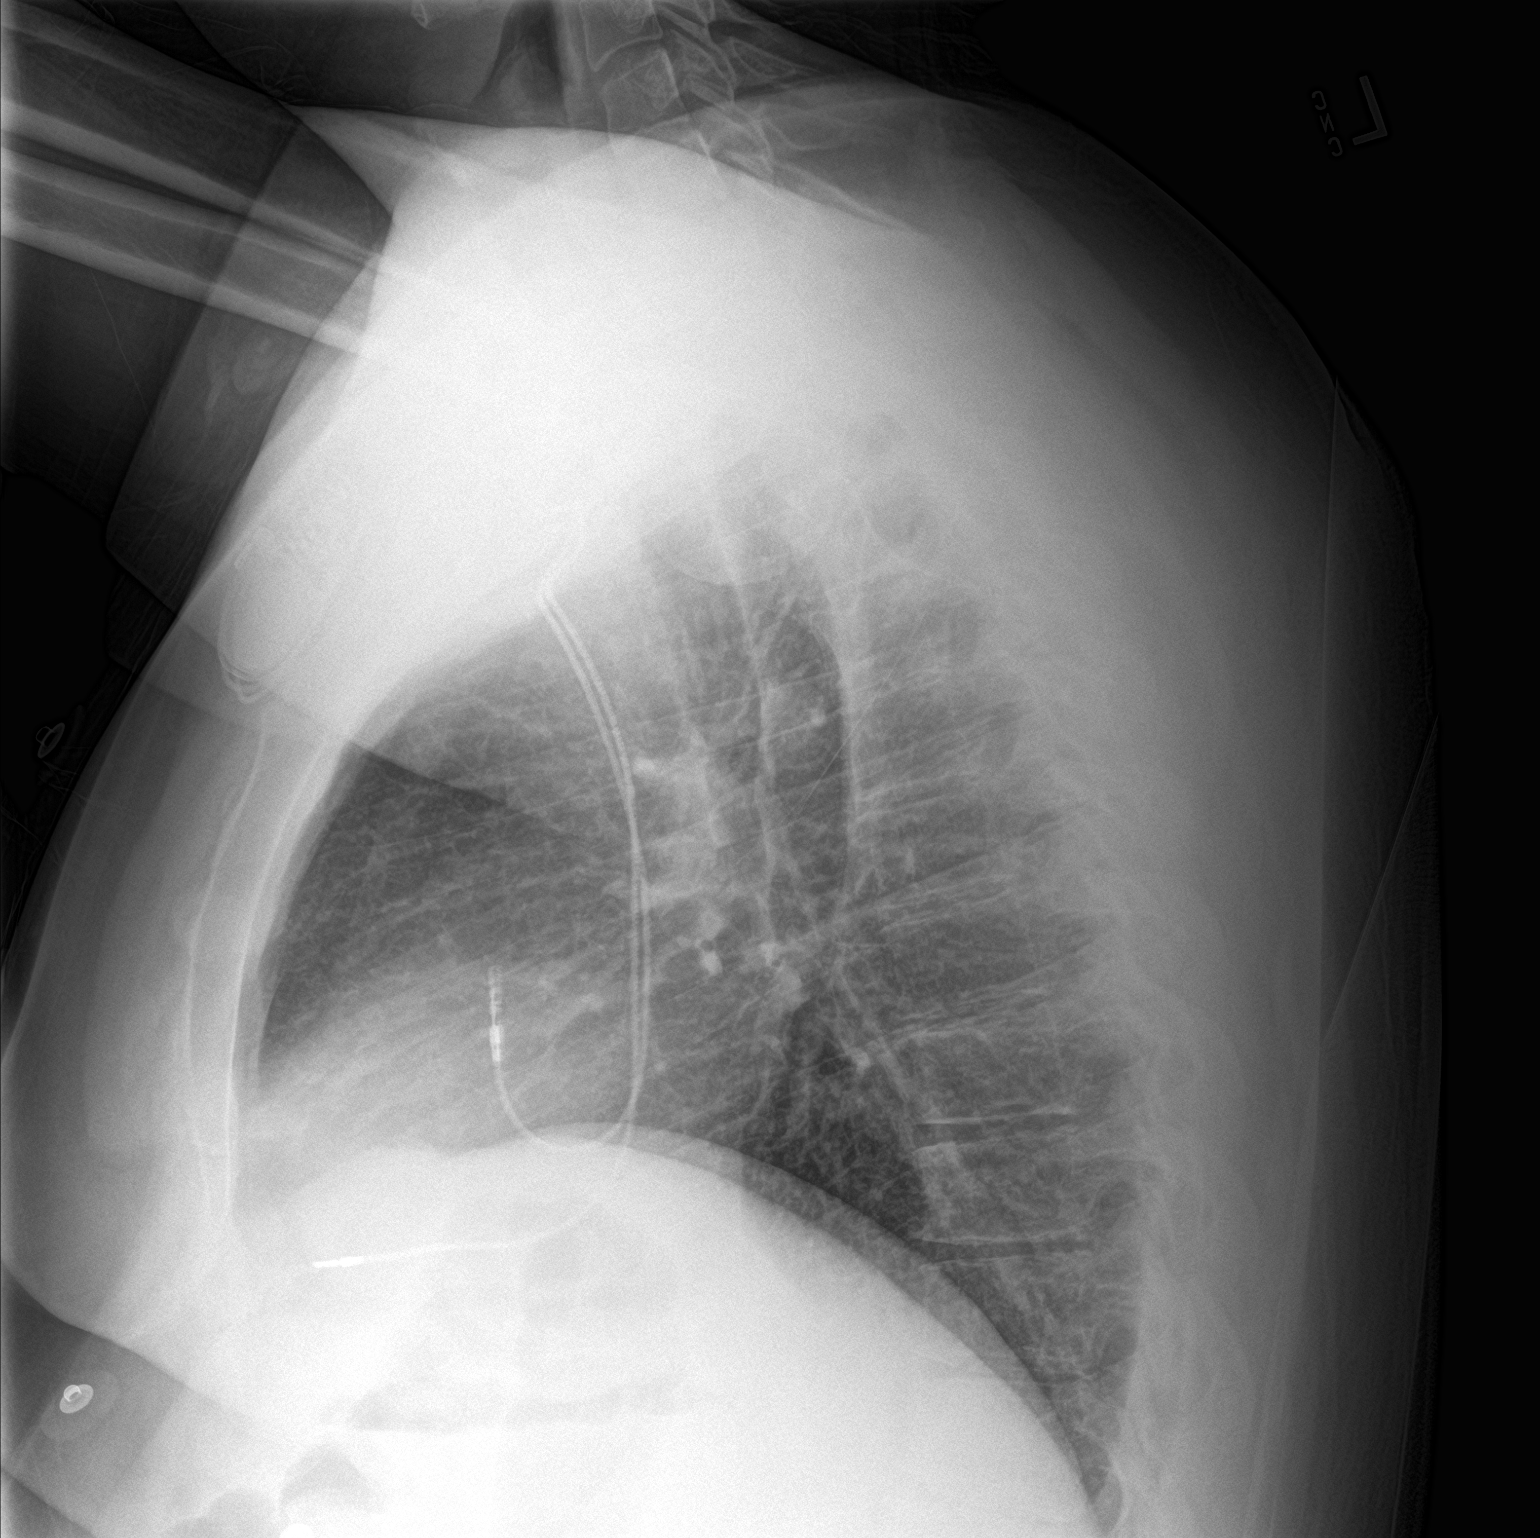
[im 2/2]
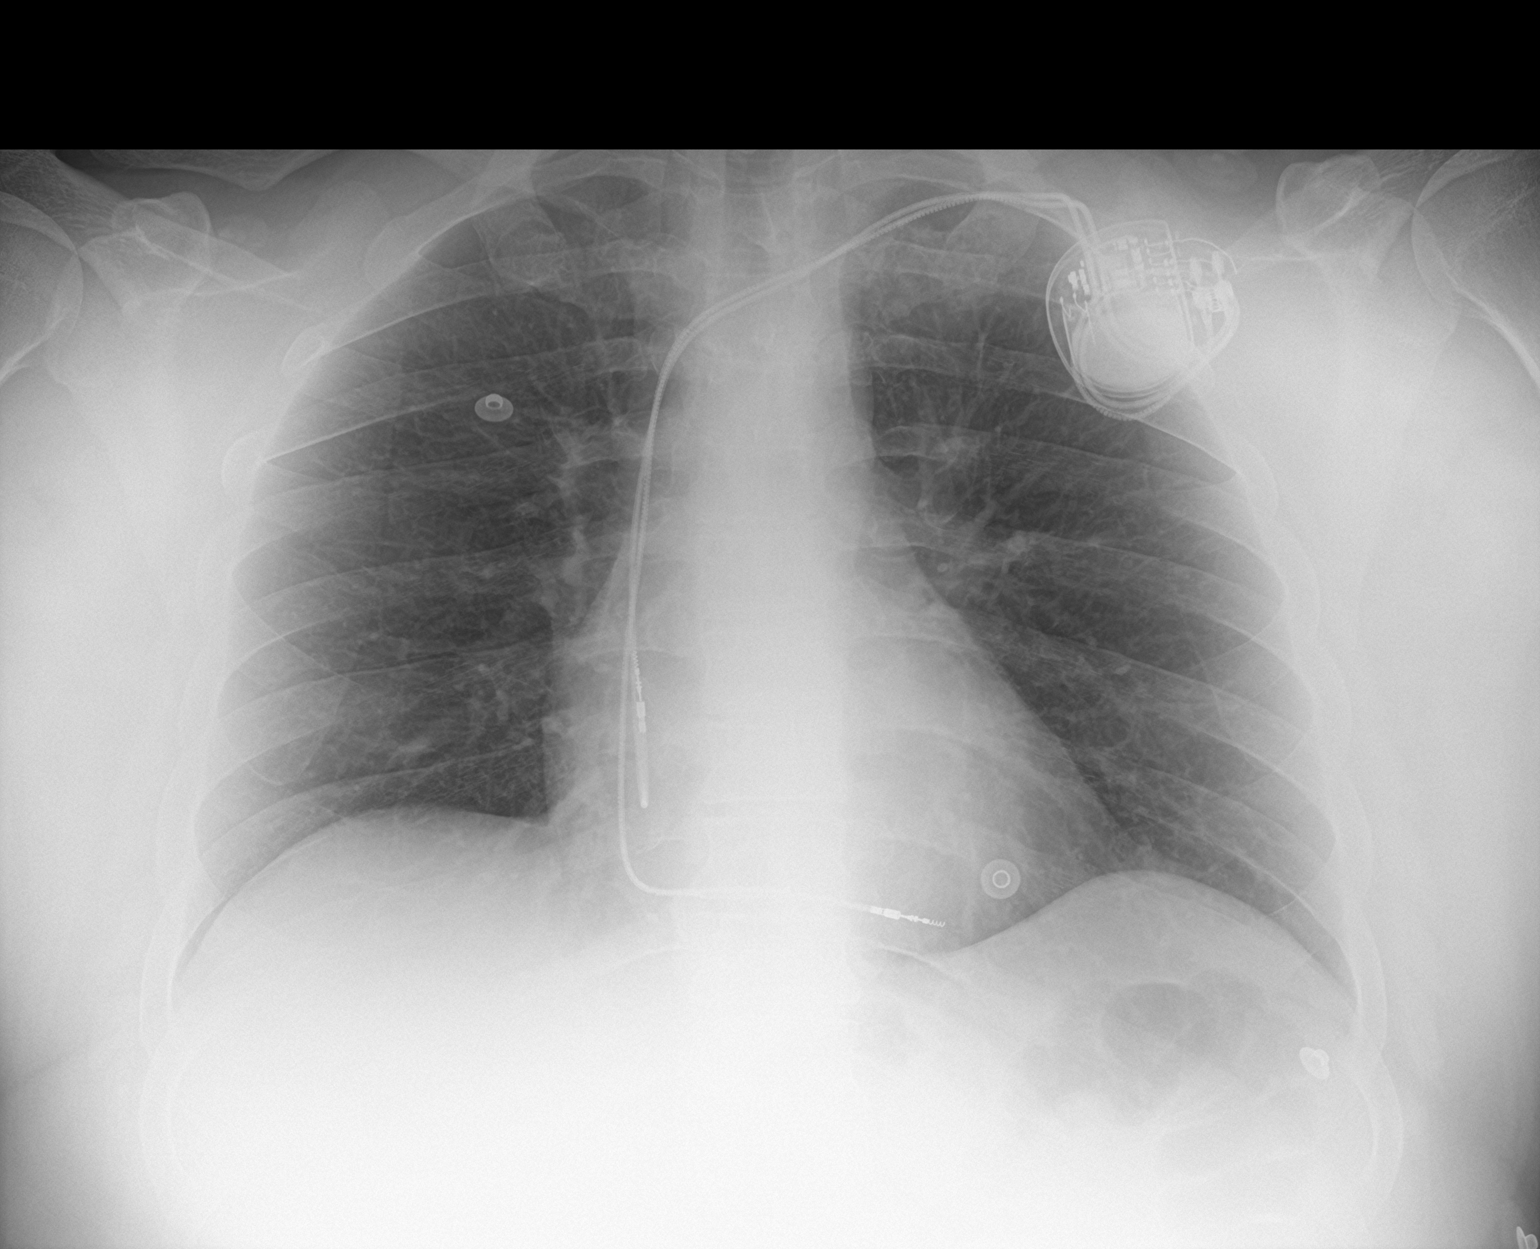

[2 of 2 positions shown; findings below may reference images not displayed]

FINDINGS: Patient has a LEFT-sided transvenous pacemaker with leads to the
RIGHT atrium and RIGHT ventricle. Heart size is normal. Lungs are
clear.
IMPRESSION: No active cardiopulmonary disease.

## 2019-02-13 ENCOUNTER — Encounter: Payer: Self-pay | Admitting: Emergency Medicine

## 2019-02-13 ENCOUNTER — Other Ambulatory Visit: Payer: Self-pay

## 2019-02-13 ENCOUNTER — Emergency Department: Payer: Self-pay

## 2019-02-13 ENCOUNTER — Emergency Department
Admission: EM | Admit: 2019-02-13 | Discharge: 2019-02-13 | Disposition: A | Discharge status: Home / Self Care | Payer: Self-pay | Attending: Emergency Medicine | Admitting: Emergency Medicine

## 2019-02-13 DIAGNOSIS — Z79899 Other long term (current) drug therapy: Secondary | ICD-10-CM | POA: Insufficient documentation

## 2019-02-13 DIAGNOSIS — I10 Essential (primary) hypertension: Secondary | ICD-10-CM | POA: Insufficient documentation

## 2019-02-13 DIAGNOSIS — Y999 Unspecified external cause status: Secondary | ICD-10-CM | POA: Insufficient documentation

## 2019-02-13 DIAGNOSIS — Y92812 Truck as the place of occurrence of the external cause: Secondary | ICD-10-CM | POA: Insufficient documentation

## 2019-02-13 DIAGNOSIS — X501XXA Overexertion from prolonged static or awkward postures, initial encounter: Secondary | ICD-10-CM | POA: Insufficient documentation

## 2019-02-13 DIAGNOSIS — S93402A Sprain of unspecified ligament of left ankle, initial encounter: Secondary | ICD-10-CM | POA: Insufficient documentation

## 2019-02-13 DIAGNOSIS — Y9389 Activity, other specified: Secondary | ICD-10-CM | POA: Insufficient documentation

## 2019-02-13 DIAGNOSIS — Z950 Presence of cardiac pacemaker: Secondary | ICD-10-CM | POA: Insufficient documentation

## 2019-02-13 DIAGNOSIS — F17290 Nicotine dependence, other tobacco product, uncomplicated: Secondary | ICD-10-CM | POA: Insufficient documentation

## 2019-02-13 NOTE — ED Provider Notes (Signed)
[facility] Emergency Department Provider Note  ____________________________________________  Time seen: Approximately 4:20 PM  I have reviewed the triage vital signs and the nursing notes.   HISTORY  Chief Complaint Ankle Injury    HPI Corey [name] is a 33 y.o. male presents emergency department for evaluation of left ankle pain after injury today.  Patient states that he stepped out of his truck and rolled his ankle.  He did not fall.  No additional injuries.   Past Medical History:  Diagnosis Date  . Bipolar 1 disorder (HCC)   . GERD (gastroesophageal reflux disease)   . HTN (hypertension)   . Schizoaffective disorder (HCC)   . 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 Darrold JunkerParaschos    Patient Active Problem List   Diagnosis Date Noted  . Overdose of medication, intentional self-harm, initial encounter (HCC) 10/09/2018  . Bipolar 1 disorder, depressed, severe (HCC) 10/09/2018  . GERD (gastroesophageal reflux disease) 08/15/2018  . 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 Cannabis use disorder, moderate, dependence (HCC) 08/14/2018  . Psychosis (HCC) 06/29/2018  . Bipolar I disorder, current or most recent episode depressed, with psychotic features (HCC) 05/04/2018  . Syncope and collapse 04/29/2018  . Hypotension 04/29/2018  . Bradycardia 04/24/2018  . 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 Kidney stone 03/26/2018    Past Surgical History:  Procedure Laterality Date  . ESOPHAGOGASTRODUODENOSCOPY (EGD) WITH PROPOFOL N/A 04/26/2018   Procedure: ESOPHAGOGASTRODUODENOSCOPY (EGD) WITH PROPOFOL;  Surgeon: Wyline MoodAnna, Kiran, MD;  [facility] ENDOSCOPY;  Service: Gastroenterology;  Laterality: N/A;  . KNEE ARTHROSCOPY    . PACEMAKER INSERTION Left 05/15/2018   Procedure: INSERTION PACEMAKER;  Surgeon: Marcina MillardParaschos, Alexander, MD;  Location: [facility] ORS;  Service: Cardiovascular;  Laterality: Left;    Prior to Admission medications    Medication Sig Start Date End Date Taking? Authorizing Provider  Brexpiprazole (REXULTI) 3 MG TABS Take 3 mg by mouth at bedtime.    [provider]  clonazePAM (KLONOPIN) 1 MG tablet Take 1 mg by mouth 2 (two) times daily as needed for anxiety. 09/05/18   [provider]  divalproex (DEPAKOTE ER) 500 MG 24 hr tablet Take 2 tablets (1,000 mg total) by mouth at bedtime. Patient taking differently: Take 1,500 mg by mouth at bedtime.  07/04/18   Hessie Knows'Neal, Sarita, MD  Doxepin HCl 6 MG TABS Take 6 mg by mouth Nightly. 10/04/18   [provider]  Eszopiclone (ESZOPICLONE) 3 MG TABS Take 3 mg by mouth at bedtime. Take immediately before bedtime    [provider]  FLUoxetine (PROZAC) 20 MG capsule Take 3 capsules (60 mg total) by mouth daily. 08/17/18   [name], Braulio ConteJolanta B, MD  hydrOXYzine (VISTARIL) 25 MG capsule Take 25-50 mg by mouth 4 (four) times daily as needed.    [provider]  nitroGLYCERIN (NITROSTAT) 0.4 MG SL tablet Place 1 tablet (0.4 mg total) under the tongue every 5 (five) minutes as needed for chest pain. 08/16/18 08/16/19  [name], Braulio ConteJolanta B, MD  pantoprazole (PROTONIX) 40 MG tablet Take 40 mg by mouth daily. 10/02/18   [provider]  QUEtiapine (SEROQUEL) 100 MG tablet Take 100 mg by mouth Nightly. 10/03/18   [provider]  risperiDONE (RISPERDAL) 3 MG tablet Take 3 mg by mouth Nightly. 07/04/18   [provider]    Allergies Penicillins and Sulfa antibiotics  Family History  Problem Relation Age of Onset  . Kidney cancer Neg Hx   . Bladder Cancer Neg  Hx   . Prostate cancer Neg Hx     Social History Social History   Tobacco Use  . Smoking status: Former Research scientist (life sciences)  . Smokeless tobacco: Current User    Types: Snuff  Substance Use Topics  . Alcohol use: No  . Drug use: Not Currently    Types: Marijuana     Review of Systems  Gastrointestinal: No nausea, no vomiting.  Musculoskeletal: Positive for  ankle pain. Skin: Negative for rash, abrasions, lacerations, ecchymosis. Neurological: Negative for headaches, numbness or tingling   ____________________________________________   PHYSICAL EXAM:  VITAL SIGNS: ED Triage Vitals  Enc Vitals Group     BP 02/13/19 1343 125/87     Pulse Rate 02/13/19 1343 90     Resp --      Temp 02/13/19 1343 97.9 F (36.6 C)     Temp Source 02/13/19 1343 Oral     SpO2 02/13/19 1343 98 %     Weight 02/13/19 1343 280 lb (127 kg)     Height 02/13/19 1343 5\' 11"  (1.803 m)     Head Circumference --      Peak Flow --      Pain Score 02/13/19 1350 2     Pain Loc --      Pain Edu? --      Excl. in [place]? --      Constitutional: Alert and oriented. Well appearing and in no acute distress. Eyes: Conjunctivae are normal. PERRL. EOMI. Head: Atraumatic. ENT:      Ears:      Nose: No congestion/rhinnorhea.      Mouth/Throat: Mucous membranes are moist.  Neck: No stridor.   Cardiovascular: Normal rate, regular rhythm.  Good peripheral circulation. Respiratory: Normal respiratory effort without tachypnea or retractions. Lungs CTAB. Good air entry to the bases with no decreased or absent breath sounds. Musculoskeletal: Full range of motion to all extremities. No gross deformities appreciated.  Swelling to left lateral ankle.  Palpable dorsalis pedis pulses. Neurologic:  Normal speech and language. No gross focal neurologic deficits are appreciated.  Skin:  Skin is warm, dry and intact. No rash noted. Psychiatric: Mood and affect are normal. Speech and behavior are normal. Patient exhibits appropriate insight and judgement.   ____________________________________________   LABS (all labs ordered are listed, but only abnormal results are displayed)  Labs Reviewed - No data to display ____________________________________________  EKG   ____________________________________________  RADIOLOGY Robinette Haines, personally viewed and evaluated these  images (plain radiographs) as part of my medical decision making, as well as reviewing the written report by the radiologist.  Dg Ankle Complete Left  Result Date: 02/13/2019 CLINICAL DATA:  Patient jumped down a dumpster with left ankle pain. EXAM: LEFT ANKLE COMPLETE - 3+ VIEW COMPARISON:  None. FINDINGS: There is no evidence of fracture, dislocation, or joint effusion. There is no evidence of arthropathy or other focal bone abnormality. Lateral ankle soft tissue swelling is noted. IMPRESSION: No acute fracture or dislocation. Lateral ankle soft tissue swelling. Electronically Signed   By: Abelardo Diesel M.D.   On: 02/13/2019 14:53    ____________________________________________    PROCEDURES  Procedure(s) performed:    Procedures    Medications - No data to display   ____________________________________________   INITIAL IMPRESSION / ASSESSMENT AND PLAN / ED COURSE  Pertinent labs & imaging results that were available during my care of the patient were reviewed by me and considered in my medical decision making (see chart for details).  Review of the [place] CSRS was performed in accordance of the [facility] prior to dispensing any controlled drugs.   Patient presents emergency department for evaluation of ankle pain after injury.  Vital signs and exam are reassuring.  X-ray negative for acute bony abnormalities.  Ankle splint was placed.  Crutches were given.  Patient declines IM medications.  Patient is to follow up with primary care Ortho as directed. Patient is given ED precautions to return to the ED for any worsening or new symptoms.   Corey [name] was evaluated in Emergency Department on 02/13/2019 for the symptoms described in the history of present illness. He was evaluated in the context of the global COVID-19 pandemic, which necessitated consideration that the patient might be at risk for infection with the SARS-CoV-2 virus that causes COVID-19. Institutional protocols and  algorithms that pertain to the evaluation of patients at risk for COVID-19 are in a state of rapid change based on information released by regulatory bodies including the CDC and federal and state organizations. These policies and algorithms were followed during the patient's care in the ED.  ____________________________________________  FINAL CLINICAL IMPRESSION(S) / ED DIAGNOSES  Final diagnoses:  Sprain of left ankle, unspecified ligament, initial encounter      NEW MEDICATIONS STARTED DURING THIS VISIT:  ED Discharge Orders    None          This chart was dictated using voice recognition software/Dragon. Despite best efforts to proofread, errors can occur which can change the meaning. Any change was purely unintentional.    Enid DerryWagner, Shakena Callari, PA-C 02/13/19 1727    Arnaldo NatalMalinda, Paul F, MD 02/13/19 1729

## 2019-02-13 NOTE — ED Triage Notes (Signed)
Patient to ER for c/o pain to left ankle. Patient states he stepped out of truck and rolled ankle.

## 2019-02-13 NOTE — ED Notes (Signed)
See triage note  States he stepped down from truck ,approx 3 ft,  rolle his ankle  Positive swelling  No deformity

## 2019-04-03 IMAGING — DX DG FOREARM 2V*R*
2 series · 2 of 2 positions shown · non-contrast
Comparison: For from 11/10/1999

CLINICAL DATA: Fall today on wet floor.

EXAM:
RIGHT FOREARM - 2 VIEW

[forearm ap]
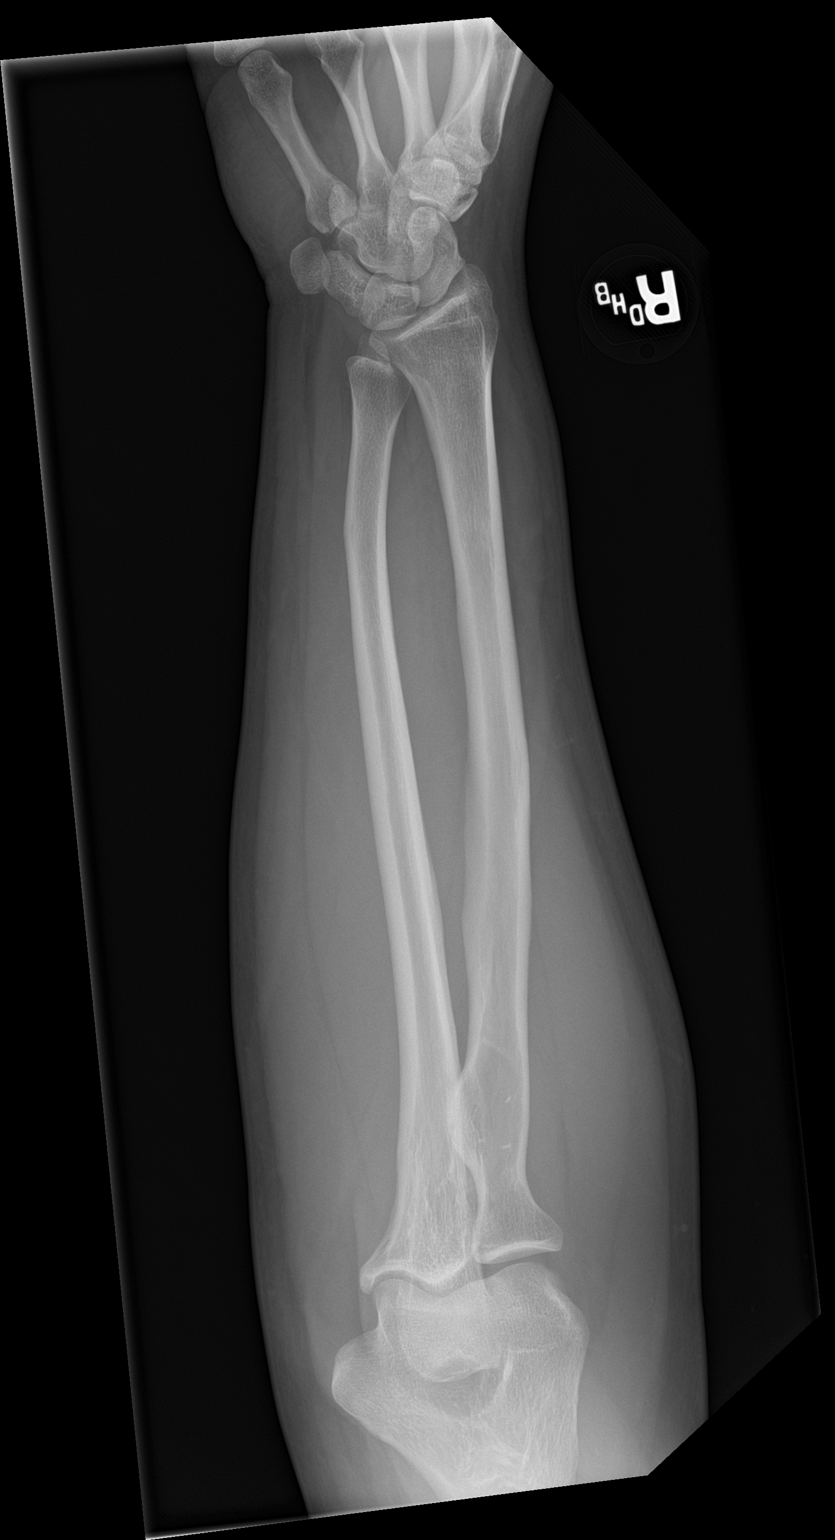

[forearm lat]
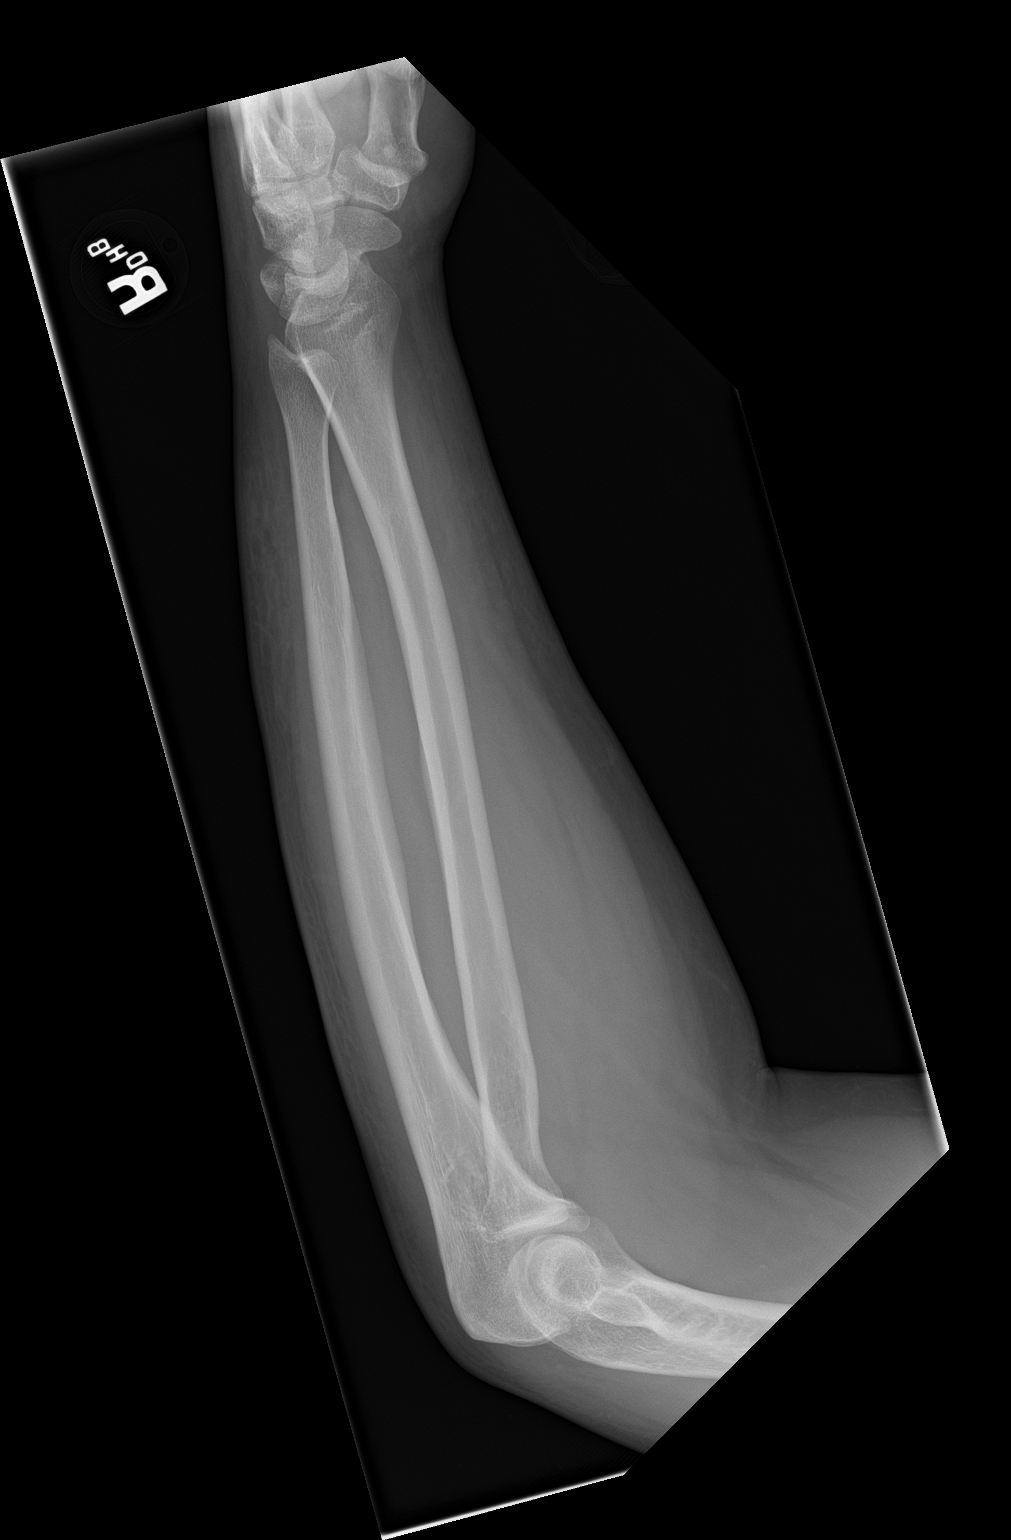

[2 of 2 positions shown; findings below may reference images not displayed]

FINDINGS: No appreciable fracture. Potential soft tissue edema dorsally along
the distal forearm. Suspected negative ulnar variance.
IMPRESSION: 1. No forearm fracture is identified.
2. Suspected negative ulnar variance.

## 2019-09-04 IMAGING — CR LEFT ANKLE COMPLETE - 3+ VIEW
1 series · 3 of 3 positions shown · non-contrast
Comparison: None.

CLINICAL DATA: Patient jumped down a dumpster with left ankle pain.

EXAM:
LEFT ANKLE COMPLETE - 3+ VIEW

[Series 1: dg ankle complete left · 0.14mm/px · 3 of 3 slices shown]
[im 1/3]
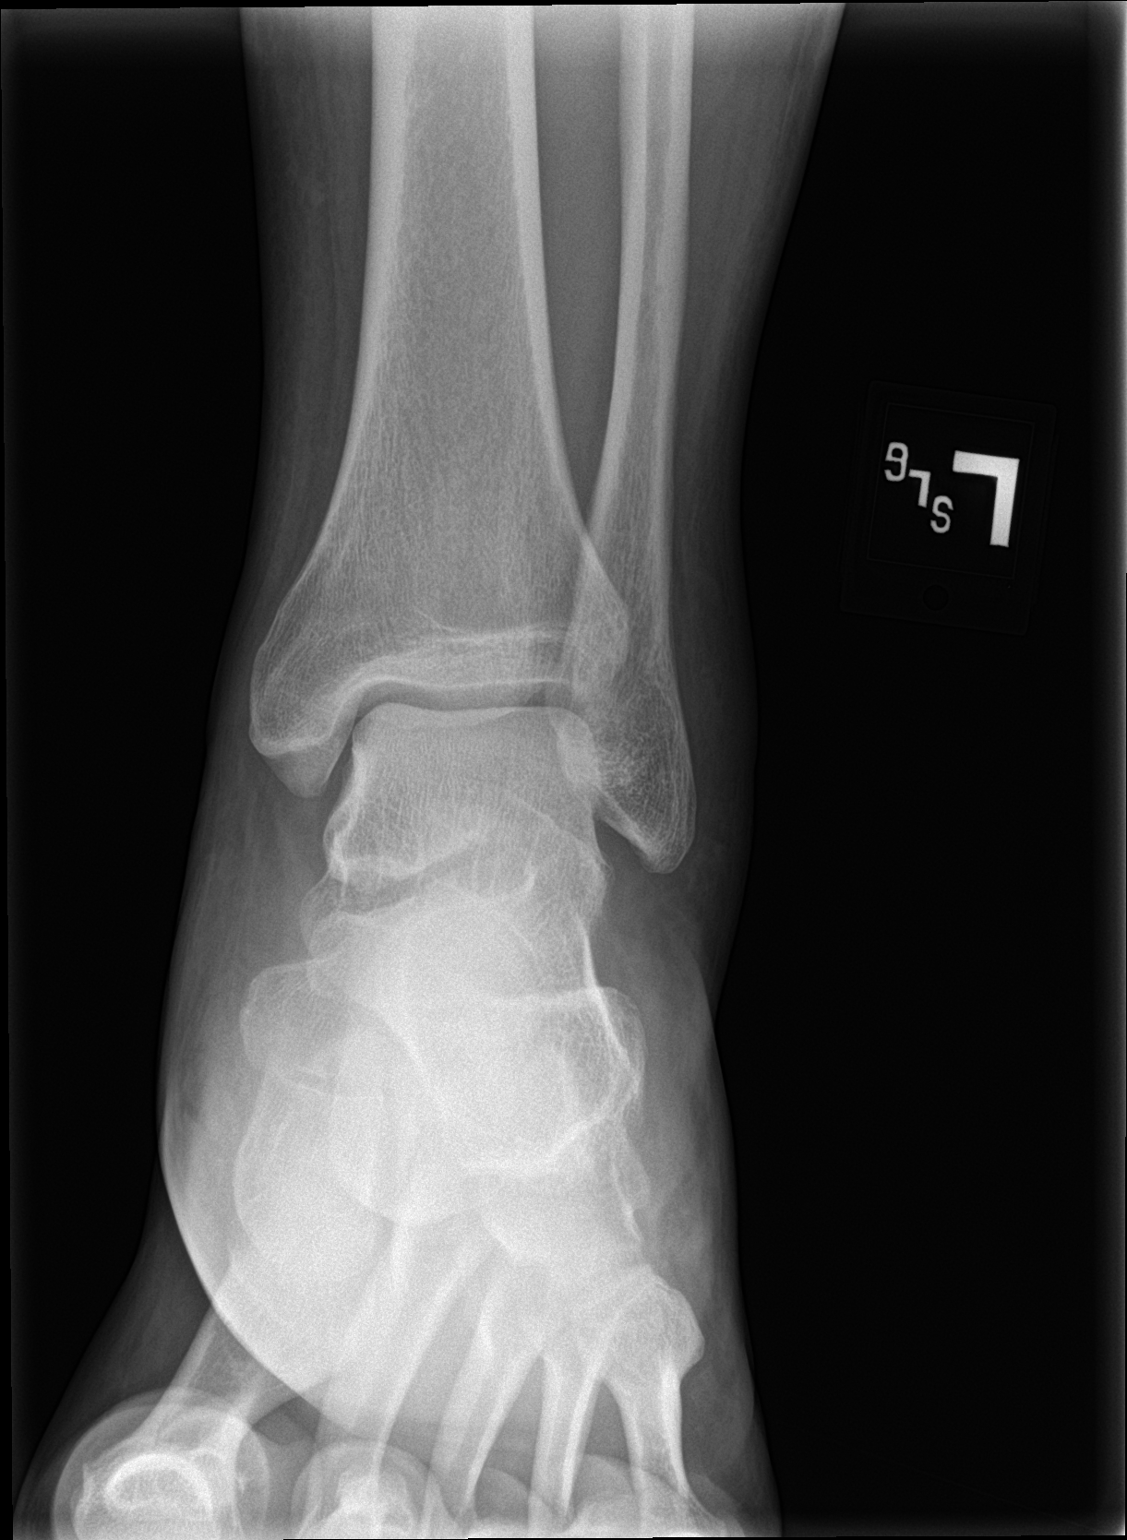
[im 2/3]
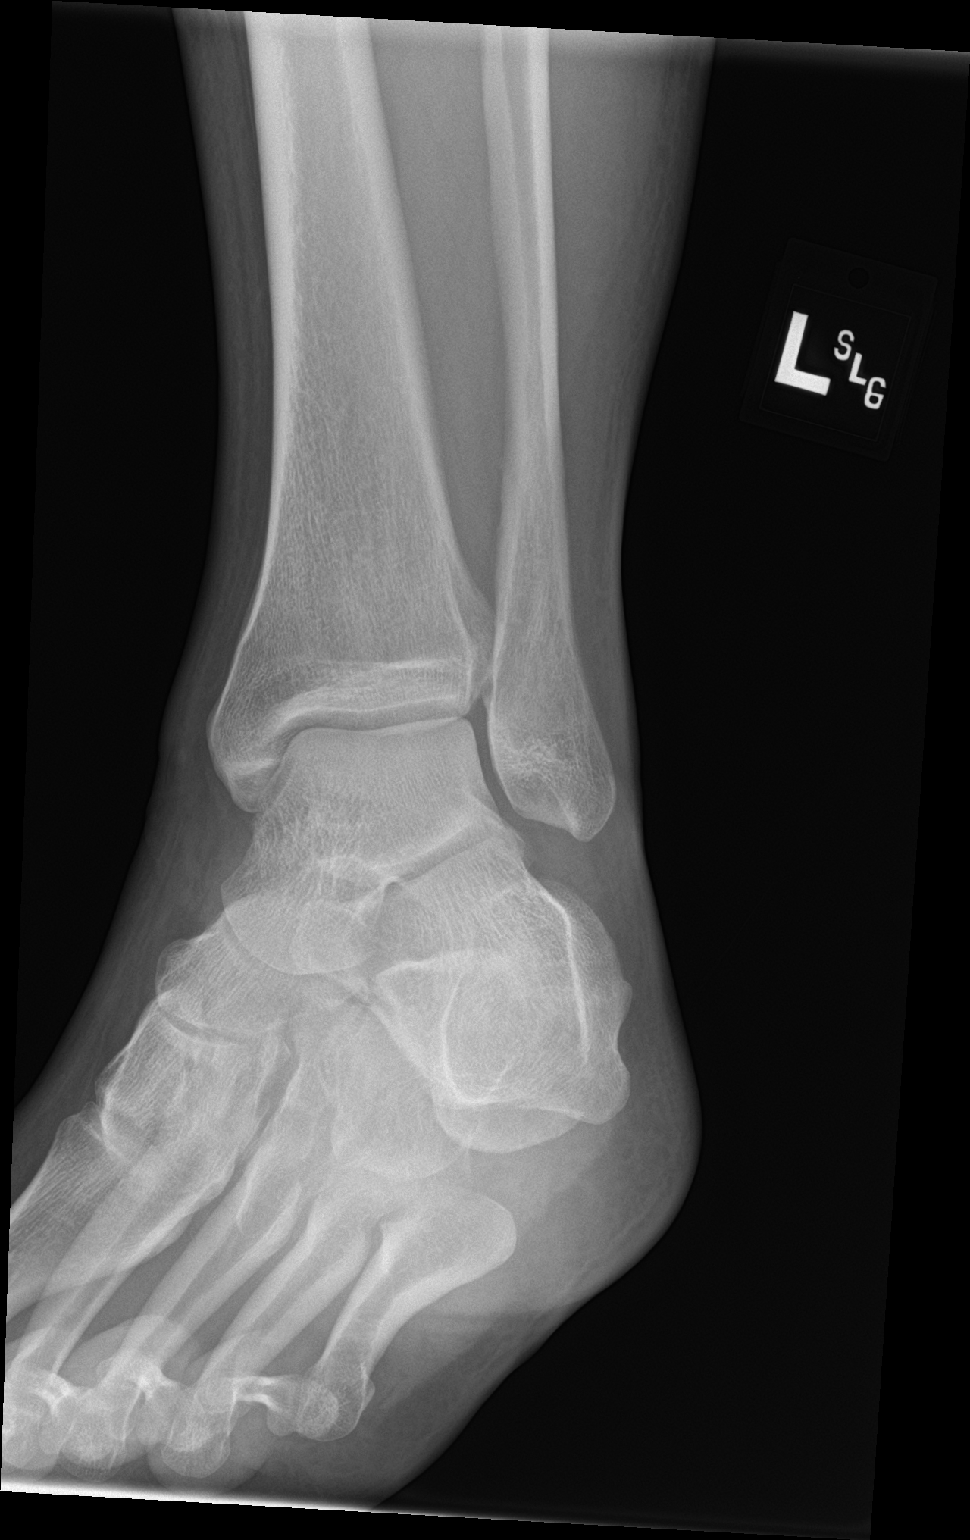
[im 3/3]
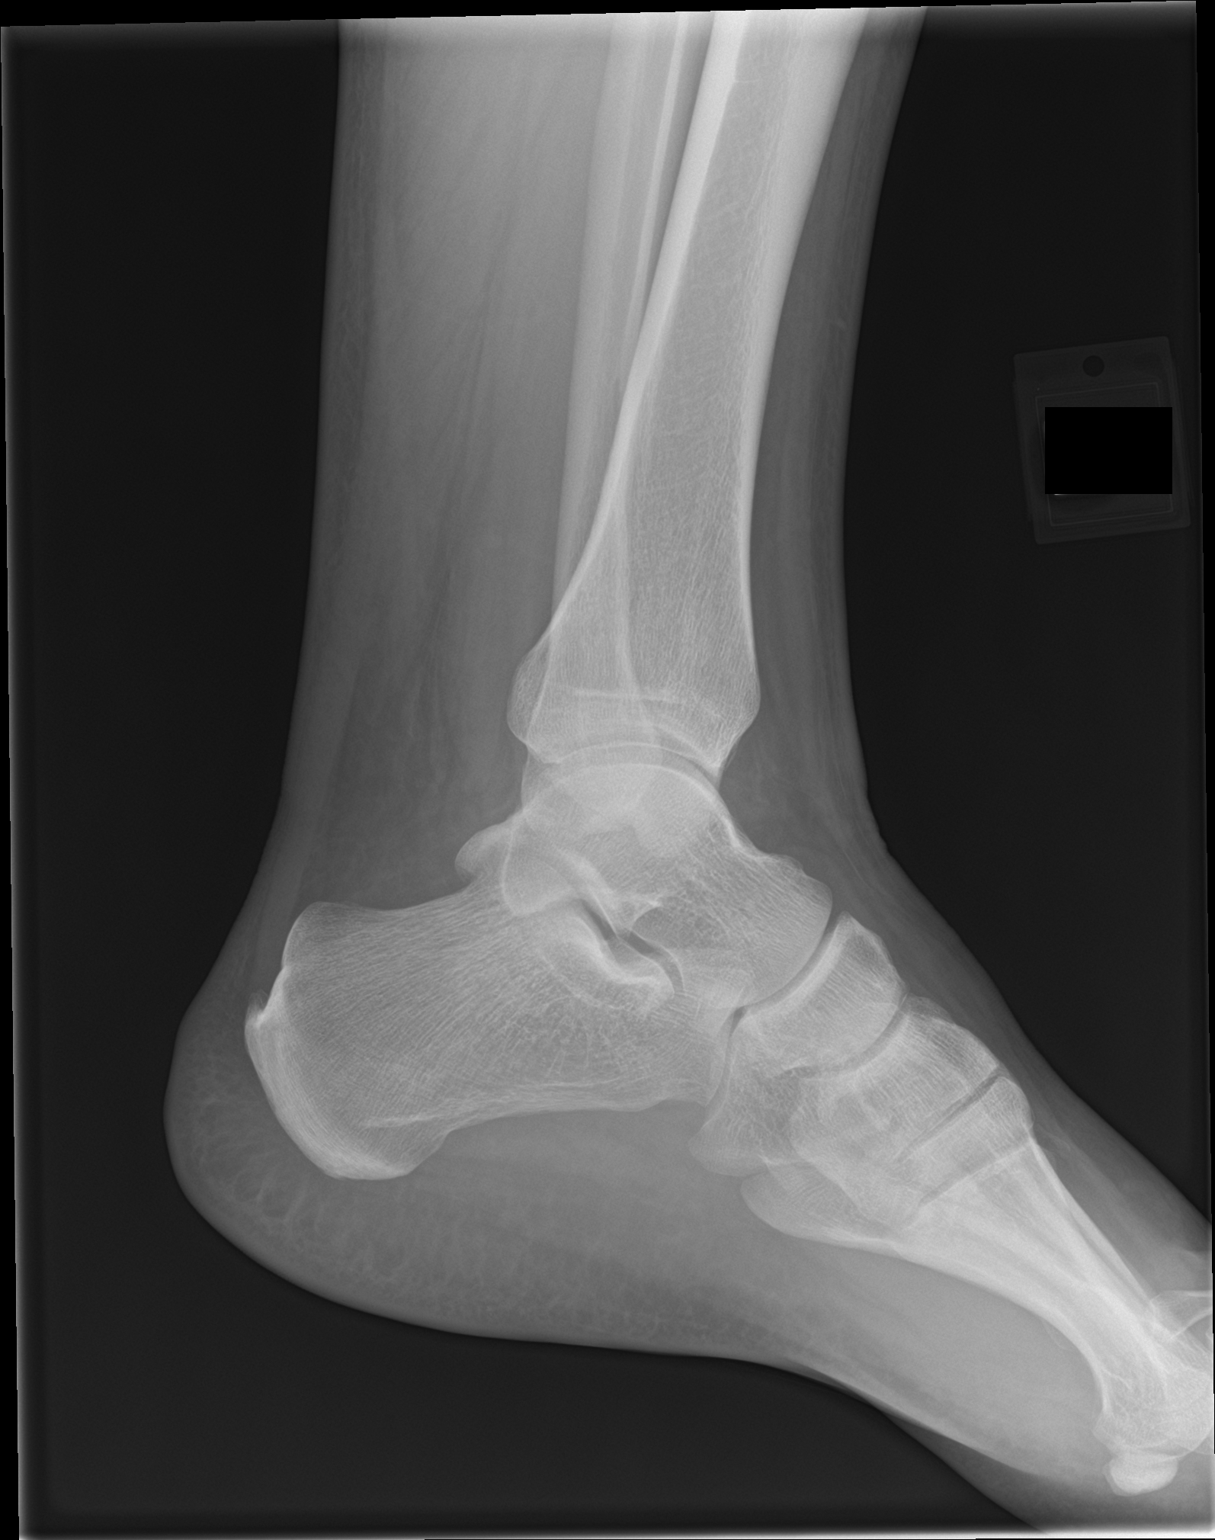

[3 of 3 positions shown; findings below may reference images not displayed]

FINDINGS: There is no evidence of fracture, dislocation, or joint effusion.
There is no evidence of arthropathy or other focal bone abnormality.
Lateral ankle soft tissue swelling is noted.
IMPRESSION: No acute fracture or dislocation. Lateral ankle soft tissue
swelling.

## 2020-01-25 ENCOUNTER — Emergency Department
Admission: EM | Admit: 2020-01-25 | Discharge: 2020-01-25 | Disposition: A | Discharge status: Home / Self Care | Payer: Medicaid Other | Attending: Emergency Medicine | Admitting: Emergency Medicine

## 2020-01-25 ENCOUNTER — Other Ambulatory Visit: Payer: Self-pay

## 2020-01-25 DIAGNOSIS — R112 Nausea with vomiting, unspecified: Secondary | ICD-10-CM | POA: Insufficient documentation

## 2020-01-25 DIAGNOSIS — I10 Essential (primary) hypertension: Secondary | ICD-10-CM | POA: Insufficient documentation

## 2020-01-25 DIAGNOSIS — Z87891 Personal history of nicotine dependence: Secondary | ICD-10-CM | POA: Insufficient documentation

## 2020-01-25 DIAGNOSIS — R197 Diarrhea, unspecified: Secondary | ICD-10-CM | POA: Insufficient documentation

## 2020-01-25 LAB — COMPREHENSIVE METABOLIC PANEL
ALT: 25 U/L (ref 0–44)
AST: 18 U/L (ref 15–41)
Albumin: 4 g/dL (ref 3.5–5.0)
Alkaline Phosphatase: 79 U/L (ref 38–126)
Anion gap: 9 (ref 5–15)
BUN: 17 mg/dL (ref 6–20)
CO2: 22 mmol/L (ref 22–32)
Calcium: 8.6 mg/dL — ABNORMAL LOW (ref 8.9–10.3)
Chloride: 109 mmol/L (ref 98–111)
Creatinine, Ser: 1.06 mg/dL (ref 0.61–1.24)
GFR calc Af Amer: >60 mL/min (ref >60)
GFR calc non Af Amer: >60 mL/min (ref >60)
Glucose, Bld: 122 mg/dL — ABNORMAL HIGH (ref 70–99)
Potassium: 3.9 mmol/L (ref 3.5–5.1)
Sodium: 140 mmol/L (ref 135–145)
Total Bilirubin: 0.6 mg/dL (ref 0.3–1.2)
Total Protein: 7.4 g/dL (ref 6.5–8.1)

## 2020-01-25 LAB — CBC
HCT: 47.5 % (ref 39.0–52.0)
Hemoglobin: 16.6 g/dL (ref 13.0–17.0)
MCH: 29.4 pg (ref 26.0–34.0)
MCHC: 34.9 g/dL (ref 30.0–36.0)
MCV: 84.2 fL (ref 80.0–100.0)
Platelets: 280 10*3/uL (ref 150–400)
RBC: 5.64 MIL/uL (ref 4.22–5.81)
RDW: 12.8 % (ref 11.5–15.5)
WBC: 10.3 10*3/uL (ref 4.0–10.5)
nRBC: 0 % (ref 0.0–0.2)

## 2020-01-25 LAB — URINALYSIS, COMPLETE (UACMP) WITH MICROSCOPIC
Bacteria, UA: NONE SEEN
Bilirubin Urine: NEGATIVE
Glucose, UA: NEGATIVE mg/dL
Hgb urine dipstick: NEGATIVE
Ketones, ur: NEGATIVE mg/dL
Leukocytes,Ua: NEGATIVE
Nitrite: NEGATIVE
Protein, ur: NEGATIVE mg/dL
Specific Gravity, Urine: 1.024 (ref 1.005–1.030)
pH: 5 (ref 5.0–8.0)

## 2020-01-25 LAB — LIPASE, BLOOD: Lipase: 29 U/L (ref 11–51)

## 2020-01-25 MED ORDER — DICYCLOMINE HCL 10 MG PO CAPS: 10.0000 mg | ORAL_CAPSULE | Freq: Three times a day (TID) | ORAL | 0 refills | Status: AC | PRN

## 2020-01-25 MED ORDER — SODIUM CHLORIDE 0.9% FLUSH: 3.0000 mL | Freq: Once | INTRAVENOUS | Status: DC

## 2020-01-25 MED ORDER — ONDANSETRON 8 MG PO TBDP: 8.0000 mg | ORAL_TABLET | Freq: Three times a day (TID) | ORAL | 0 refills | Status: DC | PRN

## 2020-01-25 NOTE — Discharge Instructions (Signed)
You may take the Zofran (ondansetron) as needed for nausea, and the Bentyl as needed for abdominal cramping or diarrhea.  We recommend that you decrease your marijuana use as this can often exacerbate GI symptoms.  Make an appointment to follow-up with a gastroenterologist within the next month.  Return to the ER for new, worsening, or persistent severe abdominal pain, vomiting, fever, weakness, blood in the stool, or any other new or worsening symptoms that concern you.

## 2020-01-25 NOTE — ED Triage Notes (Signed)
Pt arrives via POV for reports of vomiting, abdominal pain and diarrhea once a week for the last 6 months. Pt reports constipation in between vomiting/diarrhea episodes. Denies fever. Pt reports episodes can last from 4-12 hours. Pt in NAD, A&Ox59m skin warm and dry. Pt states he threw up 4 times today starting at 5 am and states "I cant even tell you how many times I have had diarrhea today".

## 2020-01-25 NOTE — ED Provider Notes (Signed)
[facility] Emergency Department Provider Note ____________________________________________   First MD Initiated Contact with Patient 01/25/20 1029     (approximate)  I have reviewed the triage vital signs and the nursing notes.   HISTORY  Chief Complaint Abdominal Pain and Emesis    HPI Corey [name] is a 34 y.o. male with PMH as noted below who presents with nausea, vomiting, diarrhea, starting around 5 AM today, now resolved.  The patient states he has some associated burning upper abdominal pain and cramps but this is resolving as well.  The patient denies any blood in the vomitus or stool.  He states that he has had similar episodes about once a week for the last 6 months, but cannot identify any specific triggers in terms of foods, time of day, or time of the week.  He has not sought work-up for this previously.  The patient denies any fever or chills, urinary symptoms, cough, or shortness of breath.  He denies any alcohol use, and states he smokes marijuana but does not use other drugs.  He is not on any medications currently.  Past Medical History:  Diagnosis Date  . Bipolar 1 disorder ([place])   . GERD (gastroesophageal reflux disease)   . HTN (hypertension)   . Schizoaffective disorder ([place])   . 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 Saralyn Pilar    Patient Active Problem List   Diagnosis Date Noted  . Overdose of medication, intentional self-harm, initial encounter ([place]) 10/09/2018  . Bipolar 1 disorder, depressed, severe ([place]) 10/09/2018  . GERD (gastroesophageal reflux disease) 08/15/2018  . 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 Cannabis use disorder, moderate, dependence ([place]) 08/14/2018  . Psychosis ([place]) 06/29/2018  . Bipolar I disorder, current or most recent episode depressed, with psychotic features ([place]) 05/04/2018  . Syncope and collapse 04/29/2018  . Hypotension 04/29/2018  . Bradycardia 04/24/2018  .  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 Kidney stone 03/26/2018    Past Surgical History:  Procedure Laterality Date  . ESOPHAGOGASTRODUODENOSCOPY (EGD) WITH PROPOFOL N/A 04/26/2018   Procedure: ESOPHAGOGASTRODUODENOSCOPY (EGD) WITH PROPOFOL;  Surgeon: Jonathon Bellows, MD;  Location: [facility] ENDOSCOPY;  Service: Gastroenterology;  Laterality: N/A;  . KNEE ARTHROSCOPY    . PACEMAKER INSERTION Left 05/15/2018   Procedure: INSERTION PACEMAKER;  Surgeon: Isaias Cowman, MD;  Location: [facility] ORS;  Service: Cardiovascular;  Laterality: Left;    Prior to Admission medications   Medication Sig Start Date End Date Taking? Authorizing Provider  Brexpiprazole (REXULTI) 3 MG TABS Take 3 mg by mouth at bedtime.    [provider]  clonazePAM (KLONOPIN) 1 MG tablet Take 1 mg by mouth 2 (two) times daily as needed for anxiety. 09/05/18   [provider]  dicyclomine (BENTYL) 10 MG capsule Take 1 capsule (10 mg total) by mouth 3 (three) times daily as needed for up to 5 days (abdominal cramping, diarrhea). 01/25/20 01/30/20  Arta Silence, MD  divalproex (DEPAKOTE ER) 500 MG 24 hr tablet Take 2 tablets (1,000 mg total) by mouth at bedtime. Patient taking differently: Take 1,500 mg by mouth at bedtime.  07/04/18   Tennis Ship, MD  Doxepin HCl 6 MG TABS Take 6 mg by mouth Nightly. 10/04/18   [provider]  Eszopiclone (ESZOPICLONE) 3 MG TABS Take 3 mg by mouth at bedtime. Take immediately before bedtime    [provider]  FLUoxetine (PROZAC) 20 MG capsule Take 3 capsules (60 mg total) by mouth daily. 08/17/18  [name], [name], MD  hydrOXYzine (VISTARIL) 25 MG capsule Take 25-50 mg by mouth 4 (four) times daily as needed.    [provider]  nitroGLYCERIN (NITROSTAT) 0.4 MG SL tablet Place 1 tablet (0.4 mg total) under the tongue every 5 (five) minutes as needed for chest pain. 08/16/18 08/16/19  [name], Ellin Goodie, MD  ondansetron (ZOFRAN ODT) 8 MG  disintegrating tablet Take 1 tablet (8 mg total) by mouth every 8 (eight) hours as needed for nausea or vomiting. 01/25/20   Dionne Bucy, MD  pantoprazole (PROTONIX) 40 MG tablet Take 40 mg by mouth daily. 10/02/18   [provider]  QUEtiapine (SEROQUEL) 100 MG tablet Take 100 mg by mouth Nightly. 10/03/18   [provider]  risperiDONE (RISPERDAL) 3 MG tablet Take 3 mg by mouth Nightly. 07/04/18   [provider]    Allergies Penicillins and Sulfa antibiotics  Family History  Problem Relation Age of Onset  . Kidney cancer Neg Hx   . Bladder Cancer Neg Hx   . Prostate cancer Neg Hx     Social History Social History   Tobacco Use  . Smoking status: Former Games developer  . Smokeless tobacco: Current User    Types: Snuff  Vaping Use  . Vaping Use: Some days  Substance Use Topics  . Alcohol use: No  . Drug use: Not Currently    Types: Marijuana    Review of Systems  Constitutional: No fever/chills Eyes: No visual changes. ENT: No sore throat. Cardiovascular: Denies chest pain. Respiratory: Denies shortness of breath. Gastrointestinal: Positive for resolved vomiting and diarrhea.  Genitourinary: Negative for dysuria.  Musculoskeletal: Negative for back pain. Skin: Negative for rash. Neurological: Negative for headaches, focal weakness or numbness.   ____________________________________________   PHYSICAL EXAM:  VITAL SIGNS: ED Triage Vitals [01/25/20 1017]  Enc Vitals Group     BP (!) 145/99     Pulse Rate 71     Resp 18     Temp 98.1 F (36.7 C)     Temp Source Oral     SpO2 97 %     Weight 260 lb (117.9 kg)     Height 5\' 11"  (1.803 m)     Head Circumference      Peak Flow      Pain Score 0     Pain Loc      Pain Edu?      Excl. in [place]?     Constitutional: Alert and oriented. Well appearing and in no acute distress. Eyes: Conjunctivae are normal.  Head: Atraumatic. Nose: No congestion/rhinnorhea. Mouth/Throat: Mucous  membranes are moist.   Neck: Normal range of motion.  Cardiovascular: Normal rate, regular rhythm.   Good peripheral circulation. Respiratory: Normal respiratory effort.  No retractions.  Gastrointestinal: Soft and nontender. No distention.  Genitourinary: No flank tenderness. Musculoskeletal: Extremities warm and well perfused.  Neurologic:  Normal speech and language. No gross focal neurologic deficits are appreciated.  Skin:  Skin is warm and dry. No rash noted. Psychiatric: Mood and affect are normal. Speech and behavior are normal.  ____________________________________________   LABS (all labs ordered are listed, but only abnormal results are displayed)  Labs Reviewed  COMPREHENSIVE METABOLIC PANEL - Abnormal; Notable for the following components:      Result Value   Glucose, Bld 122 (*)    Calcium 8.6 (*)    All other components within normal limits  URINALYSIS, COMPLETE (UACMP) WITH MICROSCOPIC - Abnormal; Notable for the following  components:   Color, Urine YELLOW (*)    APPearance CLEAR (*)    All other components within normal limits  LIPASE, BLOOD  CBC   ____________________________________________  EKG   ____________________________________________  RADIOLOGY    ____________________________________________   PROCEDURES  Procedure(s) performed: No  Procedures  Critical Care performed: No ____________________________________________   INITIAL IMPRESSION / ASSESSMENT AND PLAN / ED COURSE  Pertinent labs & imaging results that were available during my care of the patient were reviewed by me and considered in my medical decision making (see chart for details).  34 year old male with PMH as noted above presents with nausea, vomiting, diarrhea starting early this morning and now subsided.  He has no sustained abdominal pain.  He reports similar episodes that have been occurring approximately once a week for the last several months.  I reviewed the past  medical records in Epic; the patient was most recently seen in the ED in March 2020 after a medication overdose.  He denies being on any medications or any psychiatric symptoms at this time.  On exam, he is overall well-appearing.  His vital signs are normal except for mild hypertension.  The abdomen is soft and nontender.  Overall presentation is most consistent with benign functional type etiology such as cyclical vomiting, cannabinoid hyperemesis, or IBS.  Given that he has not had any work-up previously I will obtain labs.  Since his symptoms are resolving and he has no abdominal pain, there is no indication for imaging.  If the lab work-up is reassuring, I anticipate discharge home with GI follow-up.  ----------------------------------------- 11:53 AM on 01/25/2020 -----------------------------------------  Lab work-up is unremarkable.  The patient has had no further symptoms here.  He is stable for discharge home.  I counseled him on the results of the work-up.  I advised him to attempt to cut back on marijuana use as he endorses using it almost daily, and this can exacerbate his GI symptoms.  I given thorough return precautions and he expressed understanding.  He will have prescriptions for Zofran and Bentyl as well as GI referral.  ____________________________________________   FINAL CLINICAL IMPRESSION(S) / ED DIAGNOSES  Final diagnoses:  Nausea vomiting and diarrhea      NEW MEDICATIONS STARTED DURING THIS VISIT:  New Prescriptions   DICYCLOMINE (BENTYL) 10 MG CAPSULE    Take 1 capsule (10 mg total) by mouth 3 (three) times daily as needed for up to 5 days (abdominal cramping, diarrhea).   ONDANSETRON (ZOFRAN ODT) 8 MG DISINTEGRATING TABLET    Take 1 tablet (8 mg total) by mouth every 8 (eight) hours as needed for nausea or vomiting.     Note:  This document was prepared using Dragon voice recognition software and may include unintentional dictation errors.    Dionne Bucy, MD 01/25/20 1154

## 2020-01-25 NOTE — ED Notes (Signed)
ED Provider at bedside. 

## 2020-06-16 ENCOUNTER — Emergency Department
Admission: EM | Admit: 2020-06-16 | Discharge: 2020-06-16 | Disposition: A | Discharge status: Home / Self Care | Payer: Managed Care, Other (non HMO) | Attending: Emergency Medicine | Admitting: Emergency Medicine

## 2020-06-16 ENCOUNTER — Other Ambulatory Visit: Payer: Self-pay

## 2020-06-16 DIAGNOSIS — Z79899 Other long term (current) drug therapy: Secondary | ICD-10-CM | POA: Insufficient documentation

## 2020-06-16 DIAGNOSIS — I10 Essential (primary) hypertension: Secondary | ICD-10-CM | POA: Insufficient documentation

## 2020-06-16 DIAGNOSIS — R440 Auditory hallucinations: Secondary | ICD-10-CM | POA: Diagnosis present

## 2020-06-16 DIAGNOSIS — Z87891 Personal history of nicotine dependence: Secondary | ICD-10-CM | POA: Insufficient documentation

## 2020-06-16 DIAGNOSIS — F315 Bipolar disorder, current episode depressed, severe, with psychotic features: Secondary | ICD-10-CM | POA: Diagnosis not present

## 2020-06-16 DIAGNOSIS — F319 Bipolar disorder, unspecified: Secondary | ICD-10-CM

## 2020-06-16 DIAGNOSIS — Z950 Presence of cardiac pacemaker: Secondary | ICD-10-CM | POA: Insufficient documentation

## 2020-06-16 DIAGNOSIS — F1220 Cannabis dependence, uncomplicated: Secondary | ICD-10-CM | POA: Diagnosis present

## 2020-06-16 LAB — URINE DRUG SCREEN, QUALITATIVE (ARMC ONLY)
Amphetamines, Ur Screen: NOT DETECTED
Barbiturates, Ur Screen: NOT DETECTED
Benzodiazepine, Ur Scrn: NOT DETECTED
Cannabinoid 50 Ng, Ur ~~LOC~~: POSITIVE — AB
Cocaine Metabolite,Ur ~~LOC~~: NOT DETECTED
MDMA (Ecstasy)Ur Screen: NOT DETECTED
Methadone Scn, Ur: NOT DETECTED
Opiate, Ur Screen: NOT DETECTED
Phencyclidine (PCP) Ur S: NOT DETECTED
Tricyclic, Ur Screen: NOT DETECTED

## 2020-06-16 LAB — CBC WITH DIFFERENTIAL/PLATELET
Abs Immature Granulocytes: 0.03 10*3/uL (ref 0.00–0.07)
Basophils Absolute: 0.1 10*3/uL (ref 0.0–0.1)
Basophils Relative: 1 %
Eosinophils Absolute: 0.3 10*3/uL (ref 0.0–0.5)
Eosinophils Relative: 5 %
HCT: 46.5 % (ref 39.0–52.0)
Hemoglobin: 15.9 g/dL (ref 13.0–17.0)
Immature Granulocytes: 1 %
Lymphocytes Relative: 22 %
Lymphs Abs: 1.5 10*3/uL (ref 0.7–4.0)
MCH: 29.6 pg (ref 26.0–34.0)
MCHC: 34.2 g/dL (ref 30.0–36.0)
MCV: 86.4 fL (ref 80.0–100.0)
Monocytes Absolute: 0.4 10*3/uL (ref 0.1–1.0)
Monocytes Relative: 6 %
Neutro Abs: 4.3 10*3/uL (ref 1.7–7.7)
Neutrophils Relative %: 65 %
Platelets: 286 10*3/uL (ref 150–400)
RBC: 5.38 MIL/uL (ref 4.22–5.81)
RDW: 12.5 % (ref 11.5–15.5)
WBC: 6.6 10*3/uL (ref 4.0–10.5)
nRBC: 0 % (ref 0.0–0.2)

## 2020-06-16 LAB — COMPREHENSIVE METABOLIC PANEL
ALT: 19 U/L (ref 0–44)
AST: 19 U/L (ref 15–41)
Albumin: 4.1 g/dL (ref 3.5–5.0)
Alkaline Phosphatase: 80 U/L (ref 38–126)
Anion gap: 9 (ref 5–15)
BUN: 12 mg/dL (ref 6–20)
CO2: 22 mmol/L (ref 22–32)
Calcium: 9 mg/dL (ref 8.9–10.3)
Chloride: 109 mmol/L (ref 98–111)
Creatinine, Ser: 1.09 mg/dL (ref 0.61–1.24)
GFR, Estimated: >60 mL/min (ref >60)
Glucose, Bld: 126 mg/dL — ABNORMAL HIGH (ref 70–99)
Potassium: 3.7 mmol/L (ref 3.5–5.1)
Sodium: 140 mmol/L (ref 135–145)
Total Bilirubin: 0.5 mg/dL (ref 0.3–1.2)
Total Protein: 7.6 g/dL (ref 6.5–8.1)

## 2020-06-16 LAB — ETHANOL: Alcohol, Ethyl (B): <10 mg/dL (ref <10)

## 2020-06-16 MED ORDER — DIVALPROEX SODIUM ER 500 MG PO TB24: 1000.0000 mg | ORAL_TABLET | Freq: Every day | ORAL | 1 refills | Status: AC

## 2020-06-16 MED ORDER — RISPERIDONE 2 MG PO TABS: 4.0000 mg | ORAL_TABLET | Freq: Every day | ORAL | 1 refills | Status: AC

## 2020-06-16 MED ORDER — FLUOXETINE HCL 20 MG PO CAPS: 20.0000 mg | ORAL_CAPSULE | Freq: Every day | ORAL | 1 refills | Status: AC

## 2020-06-16 NOTE — ED Notes (Signed)
Patient is calm and cooperative, denies Si/hi , but states voices are soft in His head, cannot understand what they are talking about, Patient states that He struggles with anxiety so much that He cannot even go to the store, " I hate being around strangers, and I hope I can get to feeling better soon" . Nurse and Tech will continue to monitor.

## 2020-06-16 NOTE — BH Assessment (Signed)
Assessment Note  Corey [name] is an 34 y.o. male. male who presents to [facility] ED voluntarily for treatment. Per triage note, Pt here with voluntarily with hearing voices that take over his body and is overwhelming him. Pt is voluntary and wants to seek treatment. Pt has attempted to seek treatment but has not been seen by any psychiatrist. Pt calm and cooperative in triage.  During TTS assessment pt presents alert, calm and oriented x 4, cooperative, and mood-congruent with affect. The pt does not appear to be responding to internal or external stimuli. Neither is the pt presenting with any delusional thinking. Pt verified the information provided to triage RN. Pt identifies his main complaint to be overwhelming AH and depression. Pt reports struggles with hallucinations for years and noticing an increase in his symptoms in the last week. Pt reports his AH to be a mix of strange voices that grow louder when he tries to ignore them and say degrading things, telling him he is worthless. Pt also reports endorsing aggressive and emotional outburst that causes him to throw/break things or sometimes isolate himself. Pt reports to endorse dissociative symptoms stating, "sometimes I feel I am in control of my emotions but lately I feel I am just along for a ride".  Pt denied any specific triggers but reports the loss of his grandmother a few months ago. Pt reports a MH hx of bipolar disorder and some SA (Marijuana, 1-2 joints a night) to help him sleep. Pt denies struggles eating but expressed difficulty sleeping throughout the night. Pt reports an INPT hx with [facility] in 2020 for SI and denies medication compliance. Pt reports a OPT hx with the [facility] and [facility] but denies any active services in over a year. Pt denies a family hx of MH/SA or any current VH/SI/HI. Pt continues to endorse AH but was cooperative in treatment planning and agreed to his current disposition.    Per Dr. Toni Amend pt will be  discharged with the recommendation to follow up with resources provided.   Diagnosis: Per hx Bipolar 1 disorder, current or most recent episode depressed, with psychotic features  Past Medical History:  Past Medical History:  Diagnosis Date  . Bipolar 1 disorder (HCC)   . GERD (gastroesophageal reflux disease)   . HTN (hypertension)   . Schizoaffective disorder (HCC)   . 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 ESOPHAGOGASTRODUODENOSCOPY (EGD) WITH PROPOFOL N/A 04/26/2018   Procedure: ESOPHAGOGASTRODUODENOSCOPY (EGD) WITH PROPOFOL;  Surgeon: Wyline Mood, MD;  Location: [facility] ENDOSCOPY;  Service: Gastroenterology;  Laterality: N/A;  . KNEE ARTHROSCOPY    . PACEMAKER INSERTION Left 05/15/2018   Procedure: INSERTION PACEMAKER;  Surgeon: Marcina Millard, MD;  Location: [facility] ORS;  Service: Cardiovascular;  Laterality: Left;    Family History:  Family History  Problem Relation Age of Onset  . Kidney cancer Neg Hx   . Bladder Cancer Neg Hx   . Prostate cancer Neg Hx     Social History:  reports that he has quit smoking. His smokeless tobacco use includes snuff. He reports previous drug use. Drug: Marijuana. He reports that he does not drink alcohol.  Additional Social History:  Alcohol / Drug Use Pain Medications: see mar Prescriptions: see mar Over the Counter: see mar History of alcohol / drug use?: Yes Substance #1 Name of Substance 1: Marijuana; 1-2 joints daily/night  CIWA: CIWA-Ar BP: (!) 144/75 Pulse  Rate: 75 COWS:    Allergies:  Allergies  Allergen Reactions  . Penicillins Hives    Has patient had a PCN reaction causing immediate rash, facial/tongue/throat swelling, SOB or lightheadedness with hypotension: Yes Has patient had a PCN reaction causing severe rash involving mucus membranes or skin necrosis: No Has patient had a PCN reaction that required hospitalization:  No Has patient had a PCN reaction occurring within the last 10 years: No If all of the above answers are "NO", then may proceed with Cephalosporin use.   . Sulfa Antibiotics Hives    Home Medications: (Not in a hospital admission)   OB/GYN Status:  No LMP for male patient.  General Assessment Data Location of Assessment: [facility] ED TTS Assessment: In system Is this a Tele or Face-to-Face Assessment?: Face-to-Face Is this an Initial Assessment or a Re-assessment for this encounter?: Initial Assessment Patient Accompanied by:: N/A Language Other than English: No Living Arrangements: Other (Comment) What gender do you identify as?: Male Date Telepsych consult ordered in [facility]: 06/16/20 Time Telepsych consult ordered in [facility]: 0837 Marital status: Single Maiden name: n/a Pregnancy Status: No Living Arrangements: Alone Can pt return to current living arrangement?: Yes Admission Status: Voluntary Is patient capable of signing voluntary admission?: Yes Referral Source: Self/Family/Friend Insurance type: [employer]      Crisis Care Plan Living Arrangements: Alone Legal Guardian:  (self) Name of Psychiatrist: None reported  Name of Therapist: None reported   Education Status Is patient currently in school?: No Is the patient employed, unemployed or receiving disability?: Employed [employer] )  Risk to self with the past 6 months Suicidal Ideation: No Has patient been a risk to self within the past 6 months prior to admission? : No Suicidal Intent: No Has patient had any suicidal intent within the past 6 months prior to admission? : No Is patient at risk for suicide?: No Suicidal Plan?: No Has patient had any suicidal plan within the past 6 months prior to admission? : No Access to Means: No What has been your use of drugs/alcohol within the last 12 months?: Marijauna  Previous Attempts/Gestures: Yes How many times?: 1 Other Self Harm Risks: None reported  Triggers for  Past Attempts: Other (Comment) (Stress & psychosis ) Intentional Self Injurious Behavior: None Family Suicide History: No Recent stressful life event(s): Loss (Comment), Other (Comment) (Loss Grandmother; psychosis ) Persecutory voices/beliefs?: No Depression: Yes Depression Symptoms: Isolating, Feeling angry/irritable Substance abuse history and/or treatment for substance abuse?: No Suicide prevention information given to non-admitted patients: Not applicable  Risk to Others within the past 6 months Homicidal Ideation: No Does patient have any lifetime risk of violence toward others beyond the six months prior to admission? : No Thoughts of Harm to Others: No Current Homicidal Intent: No Current Homicidal Plan: No Access to Homicidal Means: No Identified Victim: n/a History of harm to others?: No Assessment of Violence: None Noted Violent Behavior Description: n/a Does patient have access to weapons?: No Criminal Charges Pending?: No Does patient have a court date: No Is patient on probation?: No  Psychosis Hallucinations: Auditory, Visual Delusions: None noted  Mental Status Report Appearance/Hygiene: Unremarkable Eye Contact: Good Motor Activity: Freedom of movement Speech: Logical/coherent Level of Consciousness: Alert Mood: Depressed, Pleasant Affect: Depressed Anxiety Level: Minimal Thought Processes: Coherent, Relevant Judgement: Unimpaired Orientation: Appropriate for developmental age Obsessive Compulsive Thoughts/Behaviors: None  Cognitive Functioning Concentration: Normal Memory: Recent Intact, Remote Intact Is patient IDD: No Insight: Good Impulse Control: Poor Appetite: Good Have  you had any weight changes? : No Change Sleep: Decreased Total Hours of Sleep:  (Pt reports to be unsure ) Vegetative Symptoms: None  ADLScreening [facility] Assessment Services) Patient's cognitive ability adequate to safely complete daily activities?: Yes Patient able to  express need for assistance with ADLs?: Yes Independently performs ADLs?: Yes (appropriate for developmental age)  Prior Inpatient Therapy Prior Inpatient Therapy: Yes Prior Therapy Dates: 2020 Prior Therapy Facilty/Provider(s): [facility] Reason for Treatment: SI  Prior Outpatient Therapy Prior Outpatient Therapy: Yes Prior Therapy Dates: 2020 Prior Therapy Facilty/Provider(s): [facility] Reason for Treatment: MH Does patient have an ACCT team?: No Does patient have Intensive In-House Services?  : No Does patient have [facility] services? : No Does patient have P4CC services?: No  ADL Screening (condition at time of admission) Patient's cognitive ability adequate to safely complete daily activities?: Yes Is the patient deaf or have difficulty hearing?: No Does the patient have difficulty seeing, even when wearing glasses/contacts?: No Does the patient have difficulty concentrating, remembering, or making decisions?: No Patient able to express need for assistance with ADLs?: Yes Does the patient have difficulty dressing or bathing?: No Independently performs ADLs?: Yes (appropriate for developmental age) Does the patient have difficulty walking or climbing stairs?: No Weakness of Legs: None Weakness of Arms/Hands: None  Home Assistive Devices/Equipment Home Assistive Devices/Equipment: None  Therapy Consults (therapy consults require a physician order) PT Evaluation Needed: No OT Evalulation Needed: No SLP Evaluation Needed: No Abuse/Neglect Assessment (Assessment to be complete while patient is alone) Abuse/Neglect Assessment Can Be Completed: Yes Physical Abuse: Denies Verbal Abuse: Denies Sexual Abuse: Denies Exploitation of patient/patient's resources: Denies Self-Neglect: Denies Values / Beliefs Cultural Requests During Hospitalization: None Spiritual Requests During Hospitalization: None Consults Spiritual Care Consult Needed: No Transition of Care Team  Consult Needed: No Advance Directives (For Healthcare) Does Patient Have a Medical Advance Directive?: No          Disposition:  Disposition Initial Assessment Completed for this Encounter: Yes Patient referred to: Other (Comment)  On Site Evaluation by:   Reviewed with Physician:    Opal Sidles 06/16/2020 11:43 AM

## 2020-06-16 NOTE — Consult Note (Signed)
[facility] Face-to-Face Psychiatry Consult   Reason for Consult: Consult for this 34 year old man presented voluntarily to the emergency room seeking mental health treatment Referring Physician: Siddiqui Patient Identification: Corey [name] MRN:  [id number] Principal Diagnosis: Bipolar I disorder, current or most recent episode depressed, with psychotic features (HCC) Diagnosis:  Principal Problem:   Bipolar I disorder, current or most recent episode depressed, with psychotic features (HCC) Active Problems:   Cannabis use disorder, moderate, dependence (HCC)   Total Time spent with patient: 1 hour  Subjective:   Corey [name] is a 34 y.o. male patient admitted with "it is the voices again".  HPI: Patient seen chart reviewed.  34 year old man came voluntarily to the emergency room seeking assistance with auditory hallucinations.  He says he has had a particularly bad weekend with the voices being much more prominent.  They have been there on and off for years but recently he has had more trouble getting them to go away.  This weekend they plagued him the whole time.  Even smoking marijuana which usually helps him to get them under control has not helped.  He is sleeping poorly at night.  Mood is anxious and somewhat depressed.  Denies any suicidal ideation or homicidal ideation.  Shows reasonably good insight.  Says that he also occasionally has visual hallucinations when he is by himself which become frightening.  He has not been on any medication or receiving mental health treatment in over a year.  Smokes marijuana nightly and what he feels is an attempt to control his symptoms.  Very cooperative to treatment planning  Past Psychiatric History: Has had a hospitalization here before in early 2020.  After that he gradually dropped out of treatment.  Past medications have included Risperdal and Rexulti in addition to Prozac and Depakote.  Has a history of panicky anxious symptoms as well.  Has had  suicidal thoughts in the past without attempts.  Risk to Self:   Risk to Others:   Prior Inpatient Therapy:   Prior Outpatient Therapy:    Past Medical History:  Past Medical History:  Diagnosis Date  . Bipolar 1 disorder (HCC)   . GERD (gastroesophageal reflux disease)   . HTN (hypertension)   . Schizoaffective disorder (HCC)   . 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 ESOPHAGOGASTRODUODENOSCOPY (EGD) WITH PROPOFOL N/A 04/26/2018   Procedure: ESOPHAGOGASTRODUODENOSCOPY (EGD) WITH PROPOFOL;  Surgeon: Wyline Mood, MD;  Location: [facility] ENDOSCOPY;  Service: Gastroenterology;  Laterality: N/A;  . KNEE ARTHROSCOPY    . PACEMAKER INSERTION Left 05/15/2018   Procedure: INSERTION PACEMAKER;  Surgeon: Marcina Millard, MD;  Location: [facility] ORS;  Service: Cardiovascular;  Laterality: Left;   Family History:  Family History  Problem Relation Age of Onset  . Kidney cancer Neg Hx   . Bladder Cancer Neg Hx   . Prostate cancer Neg Hx    Family Psychiatric  History: Does not report Social History:  Social History   Substance and Sexual Activity  Alcohol Use No     Social History   Substance and Sexual Activity  Drug Use Not Currently  . Types: Marijuana    Social History   Socioeconomic History  . Marital status: Legally Separated    Spouse name: Not on file  . Number of children: 3  . Years of education: Not on file  . Highest education level: Not on file  Occupational History  .  Not on file  Tobacco Use  . Smoking status: Former Games developer  . Smokeless tobacco: Current User    Types: Snuff  Vaping Use  . Vaping Use: Some days  Substance and Sexual Activity  . Alcohol use: No  . Drug use: Not Currently    Types: Marijuana  . Sexual activity: Not Currently  Other Topics Concern  . Not on file  Social History Narrative  . Not on file   Social Determinants of Health    Financial Resource Strain:   . Difficulty of Paying Living Expenses: Not on file  Food Insecurity:   . Worried About Programme researcher, broadcasting/film/video in the Last Year: Not on file  . Ran Out of Food in the Last Year: Not on file  Transportation Needs:   . Lack of Transportation (Medical): Not on file  . Lack of Transportation (Non-Medical): Not on file  Physical Activity:   . Days of Exercise per Week: Not on file  . Minutes of Exercise per Session: Not on file  Stress:   . Feeling of Stress : Not on file  Social Connections:   . Frequency of Communication with Friends and Family: Not on file  . Frequency of Social Gatherings with Friends and Family: Not on file  . Attends Religious Services: Not on file  . Active Member of Clubs or Organizations: Not on file  . Attends Banker Meetings: Not on file  . Marital Status: Not on file   Additional Social History:    Allergies:   Allergies  Allergen Reactions  . Penicillins Hives    Has patient had a PCN reaction causing immediate rash, facial/tongue/throat swelling, SOB or lightheadedness with hypotension: Yes Has patient had a PCN reaction causing severe rash involving mucus membranes or skin necrosis: No Has patient had a PCN reaction that required hospitalization: No Has patient had a PCN reaction occurring within the last 10 years: No If all of the above answers are "NO", then may proceed with Cephalosporin use.   . Sulfa Antibiotics Hives    Labs:  Results for orders placed or performed during the hospital encounter of 06/16/20 (from the past 48 hour(s))  Comprehensive metabolic panel     Status: Abnormal   Collection Time: 06/16/20  9:11 AM  Result Value Ref Range   Sodium 140 135 - 145 mmol/L   Potassium 3.7 3.5 - 5.1 mmol/L   Chloride 109 98 - 111 mmol/L   CO2 22 22 - 32 mmol/L   Glucose, Bld 126 (H) 70 - 99 mg/dL    Comment: Glucose reference range applies only to samples taken after fasting for at least 8  hours.   BUN 12 6 - 20 mg/dL   Creatinine, Ser 0.98 0.61 - 1.24 mg/dL   Calcium 9.0 8.9 - 11.9 mg/dL   Total Protein 7.6 6.5 - 8.1 g/dL   Albumin 4.1 3.5 - 5.0 g/dL   AST 19 15 - 41 U/L   ALT 19 0 - 44 U/L   Alkaline Phosphatase 80 38 - 126 U/L   Total Bilirubin 0.5 0.3 - 1.2 mg/dL   GFR, Estimated >14 >78 mL/min    Comment: (NOTE) Calculated using the CKD-EPI Creatinine Equation (2021)    Anion gap 9 5 - 15    Comment: Performed at [facility], [address], [place], [place] [address]  Ethanol     Status: None   Collection Time: 06/16/20  9:11 AM  Result Value Ref  Range   Alcohol, Ethyl (B) <10 <10 mg/dL    Comment: (NOTE) Lowest detectable limit for serum alcohol is 10 mg/dL.  For medical purposes only. Performed at [facility], [address] [place], [place] [address]   CBC with Diff     Status: None   Collection Time: 06/16/20  9:11 AM  Result Value Ref Range   WBC 6.6 4.0 - 10.5 K/uL   RBC 5.38 4.22 - 5.81 MIL/uL   Hemoglobin 15.9 13.0 - 17.0 g/dL   HCT 56.3 39 - 52 %   MCV 86.4 80.0 - 100.0 fL   MCH 29.6 26.0 - 34.0 pg   MCHC 34.2 30.0 - 36.0 g/dL   RDW 14.9 70.2 - 63.7 %   Platelets 286 150 - 400 K/uL   nRBC 0.0 0.0 - 0.2 %   Neutrophils Relative % 65 %   Neutro Abs 4.3 1.7 - 7.7 K/uL   Lymphocytes Relative 22 %   Lymphs Abs 1.5 0.7 - 4.0 K/uL   Monocytes Relative 6 %   Monocytes Absolute 0.4 0.1 - 1.0 K/uL   Eosinophils Relative 5 %   Eosinophils Absolute 0.3 0.0 - 0.5 K/uL   Basophils Relative 1 %   Basophils Absolute 0.1 0.0 - 0.1 K/uL   Immature Granulocytes 1 %   Abs Immature Granulocytes 0.03 0.00 - 0.07 K/uL    Comment: Performed at [facility], [address], [place], [place] [address]  Urine Drug Screen, Qualitative     Status: Abnormal   Collection Time: 06/16/20  9:20 AM  Result Value Ref Range   Tricyclic, Ur Screen NONE DETECTED NONE DETECTED   Amphetamines, Ur Screen NONE DETECTED NONE DETECTED   MDMA  (Ecstasy)Ur Screen NONE DETECTED NONE DETECTED   Cocaine Metabolite,Ur [place] NONE DETECTED NONE DETECTED   Opiate, Ur Screen NONE DETECTED NONE DETECTED   Phencyclidine (PCP) Ur S NONE DETECTED NONE DETECTED   Cannabinoid 50 Ng, Ur [place] POSITIVE (A) NONE DETECTED   Barbiturates, Ur Screen NONE DETECTED NONE DETECTED   Benzodiazepine, Ur Scrn NONE DETECTED NONE DETECTED   Methadone Scn, Ur NONE DETECTED NONE DETECTED    Comment: (NOTE) Tricyclics + metabolites, urine    Cutoff 1000 ng/mL Amphetamines + metabolites, urine  Cutoff 1000 ng/mL MDMA (Ecstasy), urine              Cutoff 500 ng/mL Cocaine Metabolite, urine          Cutoff 300 ng/mL Opiate + metabolites, urine        Cutoff 300 ng/mL Phencyclidine (PCP), urine         Cutoff 25 ng/mL Cannabinoid, urine                 Cutoff 50 ng/mL Barbiturates + metabolites, urine  Cutoff 200 ng/mL Benzodiazepine, urine              Cutoff 200 ng/mL Methadone, urine                   Cutoff 300 ng/mL  The urine drug screen provides only a preliminary, unconfirmed analytical test result and should not be used for non-medical purposes. Clinical consideration and professional judgment should be applied to any positive drug screen result due to possible interfering substances. A more specific alternate chemical method must be used in order to obtain a confirmed analytical result. Gas chromatography / mass spectrometry (GC/MS) is the preferred confirm atory method. Performed at [facility], [phone]  [address] [place], [place] [address]     No current facility-administered medications for this encounter.   Current Outpatient Medications  Medication Sig Dispense Refill  . Brexpiprazole (REXULTI) 3 MG TABS Take 3 mg by mouth at bedtime.    . clonazePAM (KLONOPIN) 1 MG tablet Take 1 mg by mouth 2 (two) times daily as needed for anxiety.    . dicyclomine (BENTYL) 10 MG capsule Take 1 capsule (10 mg total) by mouth 3 (three) times daily as  needed for up to 5 days (abdominal cramping, diarrhea). 15 capsule 0  . divalproex (DEPAKOTE ER) 500 MG 24 hr tablet Take 2 tablets (1,000 mg total) by mouth at bedtime. 60 tablet 1  . Doxepin HCl 6 MG TABS Take 6 mg by mouth Nightly.    . Eszopiclone (ESZOPICLONE) 3 MG TABS Take 3 mg by mouth at bedtime. Take immediately before bedtime    . FLUoxetine (PROZAC) 20 MG capsule Take 1 capsule (20 mg total) by mouth daily. 30 capsule 1  . hydrOXYzine (VISTARIL) 25 MG capsule Take 25-50 mg by mouth 4 (four) times daily as needed.    . nitroGLYCERIN (NITROSTAT) 0.4 MG SL tablet Place 1 tablet (0.4 mg total) under the tongue every 5 (five) minutes as needed for chest pain. 10 tablet 0  . ondansetron (ZOFRAN ODT) 8 MG disintegrating tablet Take 1 tablet (8 mg total) by mouth every 8 (eight) hours as needed for nausea or vomiting. 15 tablet 0  . pantoprazole (PROTONIX) 40 MG tablet Take 40 mg by mouth daily.    . QUEtiapine (SEROQUEL) 100 MG tablet Take 100 mg by mouth Nightly.    . risperiDONE (RISPERDAL) 2 MG tablet Take 2 tablets (4 mg total) by mouth at bedtime. 60 tablet 1    Musculoskeletal: Strength & Muscle Tone: within normal limits Gait & Station: normal Patient leans: N/A  Psychiatric Specialty Exam: Physical Exam Constitutional:      Appearance: He is well-developed.  HENT:     Head: Normocephalic and atraumatic.  Eyes:     Conjunctiva/sclera: Conjunctivae normal.     Pupils: Pupils are equal, round, and reactive to light.  Cardiovascular:     Heart sounds: Normal heart sounds.  Pulmonary:     Effort: Pulmonary effort is normal.  Abdominal:     Palpations: Abdomen is soft.  Musculoskeletal:        General: Normal range of motion.     Cervical back: Normal range of motion.  Skin:    General: Skin is warm and dry.  Neurological:     General: No focal deficit present.     Mental Status: He is alert.  Psychiatric:        Attention and Perception: Attention normal. He  perceives auditory hallucinations.        Mood and Affect: Mood is anxious.        Speech: Speech normal.        Behavior: Behavior is cooperative.        Thought Content: Thought content normal.        Cognition and Memory: Cognition normal.        Judgment: Judgment normal.     Review of Systems  Constitutional: Negative.   HENT: Negative.   Eyes: Negative.   Respiratory: Negative.   Cardiovascular: Negative.   Gastrointestinal: Negative.   Musculoskeletal: Negative.   Skin: Negative.   Neurological: Negative.   Psychiatric/Behavioral: Positive for dysphoric mood and hallucinations. Negative for suicidal ideas.  Blood pressure (!) 156/96, pulse 70, temperature 98.2 F (36.8 C), temperature source Oral, resp. rate 18, height 5\' 11"  (1.803 m), weight 117.5 kg, SpO2 98 %.Body mass index is 36.12 kg/m.  General Appearance: Casual  Eye Contact:  Fair  Speech:  Clear and Coherent  Volume:  Normal  Mood:  Euthymic  Affect:  Congruent  Thought Process:  Goal Directed  Orientation:  Full (Time, Place, and Person)  Thought Content:  Logical and Hallucinations: Auditory Visual  Suicidal Thoughts:  No  Homicidal Thoughts:  No  Memory:  Immediate;   Fair Recent;   Fair Remote;   Fair  Judgement:  Fair  Insight:  Fair  Psychomotor Activity:  Normal  Concentration:  Concentration: Fair  Recall:  [employer]is[employer]Fair  Fund of Knowledge:  Fair  Language:  Fair  Akathisia:  No  Handed:  Right  AIMS (if indicated):     Assets:  Desire for Improvement Housing Physical Health Resilience  ADL's:  Intact  Cognition:  WNL  Sleep:        Treatment Plan Summary: Daily contact with patient to assess and evaluate symptoms and progress in treatment, Medication management and Plan This is a 34 year old man who has a past history of a diagnosis of bipolar disorder but also has symptoms characteristic of anxiety.  Has had a return of hallucinations.  He has good insight and is not currently having  any dangerous ideation or plans.  He is cooperative and agreeable to appropriate treatment plan.  I suggested after reviewing options that we restart Risperdal Depakote and Prozac as he was taking them currently but with a slight increase in the Risperdal to 4 mg.  Side effects reviewed.  Prescriptions given and the patient will be given information about how to get a referral to an outpatient mental health provider.  Does not require commitment or inpatient hospitalization.  Case reviewed with emergency room physician and TTS.  Disposition: Patient does not meet criteria for psychiatric inpatient admission. Supportive therapy provided about ongoing stressors.  Mordecai RasmussenJohn Kiko Ripp, MD 06/16/2020 11:21 AM

## 2020-06-16 NOTE — ED Notes (Signed)
Patient ambulated to [facility], He is calm, and cooperative, no signs of distress, will continue to monitor.

## 2020-06-16 NOTE — ED Notes (Signed)
Patient voices understanding of discharge instructions, all belongings given back to Patient, no signs of distress, Patient left per self.

## 2020-06-16 NOTE — ED Provider Notes (Addendum)
[facility] Emergency Department Provider Note ____________________________________________   First MD Initiated Contact with Patient 06/16/20 [phone]     (approximate)  I have reviewed the triage vital signs and the nursing notes.   HISTORY  Chief Complaint Mental Health Problem    HPI Corey [name] is a 34 y.o. male with PMH as noted below including a history of schizoaffective and bipolar disorder as well as substance abuse who presents with auditory hallucinations over the last week.  The patient states that he is hearing voices that start as chatter but then started to say degrading things, telling him that he is worthless.  He states he then will become very angry and have emotional outbursts in which he feels like he is "just along for the ride" and not in control.  He states during one of these outburst yesterday he punched a wall.  He denies any thoughts of self-harm.  He states he was hospitalized early last year and was to be started on medication upon his release, but could not afford it.  He has been self-medicating with marijuana.  He denies any medical complaints.   Past Medical History:  Diagnosis Date  . Bipolar 1 disorder (HCC)   . GERD (gastroesophageal reflux disease)   . HTN (hypertension)   . Schizoaffective disorder (HCC)   . 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 Overdose of medication, intentional self-harm, initial encounter (HCC) 10/09/2018  . Bipolar 1 disorder, depressed, severe (HCC) 10/09/2018  . GERD (gastroesophageal reflux disease) 08/15/2018  . 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 Cannabis use disorder, moderate, dependence (HCC) 08/14/2018  . Psychosis (HCC) 06/29/2018  . Bipolar I disorder, current or most recent episode depressed, with psychotic features (HCC) 05/04/2018  . Syncope and collapse 04/29/2018  . Hypotension  04/29/2018  . Bradycardia 04/24/2018  . 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 Kidney stone 03/26/2018    Past Surgical History:  Procedure Laterality Date  . ESOPHAGOGASTRODUODENOSCOPY (EGD) WITH PROPOFOL N/A 04/26/2018   Procedure: ESOPHAGOGASTRODUODENOSCOPY (EGD) WITH PROPOFOL;  Surgeon: Wyline Mood, MD;  Location: [facility] ENDOSCOPY;  Service: Gastroenterology;  Laterality: N/A;  . KNEE ARTHROSCOPY    . PACEMAKER INSERTION Left 05/15/2018   Procedure: INSERTION PACEMAKER;  Surgeon: Marcina Millard, MD;  Location: [facility] ORS;  Service: Cardiovascular;  Laterality: Left;    Prior to Admission medications   Medication Sig Start Date End Date Taking? Authorizing Provider  Brexpiprazole (REXULTI) 3 MG TABS Take 3 mg by mouth at bedtime.    [provider]  clonazePAM (KLONOPIN) 1 MG tablet Take 1 mg by mouth 2 (two) times daily as needed for anxiety. 09/05/18   [provider]  dicyclomine (BENTYL) 10 MG capsule Take 1 capsule (10 mg total) by mouth 3 (three) times daily as needed for up to 5 days (abdominal cramping, diarrhea). 01/25/20 01/30/20  Dionne Bucy, MD  divalproex (DEPAKOTE ER) 500 MG 24 hr tablet Take 2 tablets (1,000 mg total) by mouth at bedtime. 06/16/20   [name], [name], MD  Doxepin HCl 6 MG TABS Take 6 mg by mouth Nightly. 10/04/18   [provider]  Eszopiclone (ESZOPICLONE) 3 MG TABS Take 3 mg by mouth at bedtime. Take immediately before bedtime    [provider]  FLUoxetine (PROZAC) 20 MG capsule Take 1 capsule (20 mg total) by mouth daily. 06/16/20   [name], [name], MD  hydrOXYzine (VISTARIL) 25 MG capsule Take 25-50 mg by mouth 4 (four) times daily as needed.    [provider]  nitroGLYCERIN (NITROSTAT) 0.4 MG SL tablet Place 1 tablet (0.4 mg total) under the tongue every 5 (five) minutes as needed for chest pain. 08/16/18 08/16/19  [name], Ellin Goodie, MD  ondansetron (ZOFRAN ODT) 8 MG disintegrating tablet Take 1 tablet  (8 mg total) by mouth every 8 (eight) hours as needed for nausea or vomiting. 01/25/20   Dionne Bucy, MD  pantoprazole (PROTONIX) 40 MG tablet Take 40 mg by mouth daily. 10/02/18   [provider]  QUEtiapine (SEROQUEL) 100 MG tablet Take 100 mg by mouth Nightly. 10/03/18   [provider]  risperiDONE (RISPERDAL) 2 MG tablet Take 2 tablets (4 mg total) by mouth at bedtime. 06/16/20   [name], [name], MD    Allergies Penicillins and Sulfa antibiotics  Family History  Problem Relation Age of Onset  . Kidney cancer Neg Hx   . Bladder Cancer Neg Hx   . Prostate cancer Neg Hx     Social History Social History   Tobacco Use  . Smoking status: Former Games developer  . Smokeless tobacco: Current User    Types: Snuff  Vaping Use  . Vaping Use: Some days  Substance Use Topics  . Alcohol use: No  . Drug use: Not Currently    Types: Marijuana    Review of Systems  Constitutional: No fever. Eyes: No redness. ENT: No sore throat. Cardiovascular: Denies chest pain. Respiratory: Denies shortness of breath. Gastrointestinal: No vomiting or diarrhea.  Genitourinary: Negative for dysuria.  Musculoskeletal: Negative for back pain. Skin: Negative for rash. Neurological: Negative for headache.   ____________________________________________   PHYSICAL EXAM:  VITAL SIGNS: ED Triage Vitals  Enc Vitals Group     BP 06/16/20 0911 (!) 156/96     Pulse Rate 06/16/20 0908 70     Resp 06/16/20 0908 18     Temp 06/16/20 0908 98.2 F (36.8 C)     Temp Source 06/16/20 0908 Oral     SpO2 06/16/20 0908 98 %     Weight 06/16/20 0909 259 lb (117.5 kg)     Height 06/16/20 0909 5\' 11"  (1.803 m)     Head Circumference --      Peak Flow --      Pain Score 06/16/20 0909 0     Pain Loc --      Pain Edu? --      Excl. in GC? --     Constitutional: Alert and oriented. Well appearing and in no acute distress. Eyes: Conjunctivae are normal.  Head: Atraumatic. Nose: No  congestion/rhinnorhea. Mouth/Throat: Mucous membranes are moist.   Neck: Normal range of motion.  Cardiovascular: Good peripheral circulation. Respiratory: Normal respiratory effort.  No retractions.  Gastrointestinal: No distention.  Musculoskeletal: Extremities warm and well perfused.  Neurologic:  Normal speech and language. No gross focal neurologic deficits are appreciated.  Skin:  Skin is warm and dry. No rash noted. Psychiatric: Mood and affect are normal. Speech and behavior are normal.  ____________________________________________   LABS (all labs ordered are listed, but only abnormal results are displayed)  Labs Reviewed  COMPREHENSIVE METABOLIC PANEL - Abnormal; Notable for the following components:      Result Value   Glucose, Bld 126 (*)    All other components within normal limits  URINE DRUG SCREEN, QUALITATIVE ([facility] ONLY) - Abnormal; Notable for the following components:   Cannabinoid  50 Ng, Ur Spencer POSITIVE (*)    All other components within normal limits  ETHANOL  CBC WITH DIFFERENTIAL/PLATELET   ____________________________________________  EKG   ____________________________________________  RADIOLOGY    ____________________________________________   PROCEDURES  Procedure(s) performed: No  Procedures  Critical Care performed: No ____________________________________________   INITIAL IMPRESSION / ASSESSMENT AND PLAN / ED COURSE  Pertinent labs & imaging results that were available during my care of the patient were reviewed by me and considered in my medical decision making (see chart for details).  34 year old male with PMH as noted above including a history of schizoaffective disorder and bipolar presents with auditory hallucinations, increased anxiety, and outbursts of anger.  He denies any thoughts of self-harm.  I reviewed the past medical records in epic.  The patient's most recent psychiatric admission was in early 2020.  At that time  he had auditory hallucinations that were commanding him to kill himself.  He was discharged on Prozac and Depakote.  On exam currently, the patient is calm and cooperative.  He is able to carry on a normal conversation and is not responding to internal stimuli.  His vital signs are normal except for mild hypertension.  Physical exam is unremarkable.  Presentation is concerning for exacerbation of his underlying psychiatric disorders, especially given medication noncompliance and substance abuse.  At this time, the patient does not demonstrate immediate danger to self or others and there is no indication for involuntary commitment.  I have ordered psychiatry consult for further evaluation.  _________________________  The patient has been placed in psychiatric observation due to the need to provide a safe environment for the patient while obtaining psychiatric consultation and evaluation, as well as ongoing medical and medication management to treat the patient's condition.  The patient has not been placed under full IVC at this time.  ----------------------------------------- 10:58 AM on 06/16/2020 -----------------------------------------  Lab work-up is unremarkable.  The patient has been evaluated by Dr. Toni Amend from psychiatry who agrees that there is no indication for inpatient admission.  He has prescribed several medications, and will give follow-up recommendations to the patient.  Return precautions given, and the patient expresses understanding.  ____________________________________________   FINAL CLINICAL IMPRESSION(S) / ED DIAGNOSES  Final diagnoses:  Bipolar affective disorder, remission status unspecified (HCC)  Auditory hallucinations      NEW MEDICATIONS STARTED DURING THIS VISIT:  Discharge Medication List as of 06/16/2020 10:58 AM       Note:  This document was prepared using Dragon voice recognition software and may include unintentional dictation errors.     Dionne Bucy, MD 06/16/20 1059    Dionne Bucy, MD 06/16/20 1301

## 2020-06-16 NOTE — ED Notes (Signed)
Dr. Toni Amend talking with Patient and Patient remains calm and cooperative.

## 2020-06-16 NOTE — Discharge Instructions (Addendum)
Take the medications as prescribed.  Follow-up as instructed by the psychiatrist.  Return to the ER for new, worsening, or persistent voices, severe anxiety, anger outbursts, any thoughts of wanting to hurt yourself or anyone else, or any other new or worsening symptoms that concern you.

## 2020-06-16 NOTE — ED Notes (Signed)
Pt belongings:  1 pair of dark sunglasses 1 gray jacket 1 orange shirt 1 gray cap 1 pair of brown boots 1 pair of jeans 1 black cell phone 1 silver nose piercing 1 grey book bag 1 pair of black boxers

## 2020-06-16 NOTE — ED Triage Notes (Signed)
Pt here with voluntarily with hearing voices that take over his body and is overwhelming him. Pt is voluntary and wants to seek treatment. Pt has attempted to seek treatment but has not been seen by any psychiatrist. Pt calm and cooperative in triage.

## 2020-07-04 ENCOUNTER — Emergency Department
Admission: EM | Admit: 2020-07-04 | Discharge: 2020-07-04 | Disposition: A | Discharge status: Home / Self Care | Payer: Managed Care, Other (non HMO) | Attending: Emergency Medicine | Admitting: Emergency Medicine

## 2020-07-04 ENCOUNTER — Other Ambulatory Visit: Payer: Self-pay

## 2020-07-04 DIAGNOSIS — Z950 Presence of cardiac pacemaker: Secondary | ICD-10-CM | POA: Insufficient documentation

## 2020-07-04 DIAGNOSIS — Z87891 Personal history of nicotine dependence: Secondary | ICD-10-CM | POA: Insufficient documentation

## 2020-07-04 DIAGNOSIS — R440 Auditory hallucinations: Secondary | ICD-10-CM | POA: Insufficient documentation

## 2020-07-04 DIAGNOSIS — I10 Essential (primary) hypertension: Secondary | ICD-10-CM | POA: Insufficient documentation

## 2020-07-04 LAB — CBC
HCT: 45.7 % (ref 39.0–52.0)
Hemoglobin: 15.5 g/dL (ref 13.0–17.0)
MCH: 29.5 pg (ref 26.0–34.0)
MCHC: 33.9 g/dL (ref 30.0–36.0)
MCV: 87 fL (ref 80.0–100.0)
Platelets: 267 10*3/uL (ref 150–400)
RBC: 5.25 MIL/uL (ref 4.22–5.81)
RDW: 12.4 % (ref 11.5–15.5)
WBC: 8.8 10*3/uL (ref 4.0–10.5)
nRBC: 0 % (ref 0.0–0.2)

## 2020-07-04 LAB — URINE DRUG SCREEN, QUALITATIVE (ARMC ONLY)
Amphetamines, Ur Screen: NOT DETECTED
Barbiturates, Ur Screen: NOT DETECTED
Benzodiazepine, Ur Scrn: NOT DETECTED
Cannabinoid 50 Ng, Ur ~~LOC~~: POSITIVE — AB
Cocaine Metabolite,Ur ~~LOC~~: NOT DETECTED
MDMA (Ecstasy)Ur Screen: NOT DETECTED
Methadone Scn, Ur: NOT DETECTED
Opiate, Ur Screen: NOT DETECTED
Phencyclidine (PCP) Ur S: NOT DETECTED
Tricyclic, Ur Screen: NOT DETECTED

## 2020-07-04 LAB — COMPREHENSIVE METABOLIC PANEL
ALT: 23 U/L (ref 0–44)
AST: 20 U/L (ref 15–41)
Albumin: 4.2 g/dL (ref 3.5–5.0)
Alkaline Phosphatase: 67 U/L (ref 38–126)
Anion gap: 12 (ref 5–15)
BUN: 10 mg/dL (ref 6–20)
CO2: 25 mmol/L (ref 22–32)
Calcium: 8.8 mg/dL — ABNORMAL LOW (ref 8.9–10.3)
Chloride: 104 mmol/L (ref 98–111)
Creatinine, Ser: 1 mg/dL (ref 0.61–1.24)
GFR, Estimated: >60 mL/min (ref >60)
Glucose, Bld: 88 mg/dL (ref 70–99)
Potassium: 3.9 mmol/L (ref 3.5–5.1)
Sodium: 141 mmol/L (ref 135–145)
Total Bilirubin: 0.9 mg/dL (ref 0.3–1.2)
Total Protein: 7.7 g/dL (ref 6.5–8.1)

## 2020-07-04 LAB — ETHANOL: Alcohol, Ethyl (B): <10 mg/dL (ref <10)

## 2020-07-04 LAB — SALICYLATE LEVEL: Salicylate Lvl: <7 mg/dL — ABNORMAL LOW (ref 7.0–30.0)

## 2020-07-04 LAB — ACETAMINOPHEN LEVEL: Acetaminophen (Tylenol), Serum: <10 ug/mL — ABNORMAL LOW (ref 10–30)

## 2020-07-04 NOTE — Discharge Instructions (Addendum)
Please seek medical attention and help for any thoughts about wanting to harm yourself, harm others, any concerning change in behavior, severe depression, inappropriate drug use or any other new or concerning symptoms. ° °

## 2020-07-04 NOTE — ED Notes (Signed)
Pt given clothes to change back for pending DC

## 2020-07-04 NOTE — ED Notes (Signed)
No peripheral IV placed this visit.   Discharge instructions reviewed with patient. Questions fielded by this RN. Patient verbalizes understanding of instructions. Patient discharged home in stable condition per Dr Derrill Kay . No acute distress noted at time of discharge.   Pt declined DC VS

## 2020-07-04 NOTE — ED Triage Notes (Signed)
Pt to ED via POV with c/o hearing voices that started this morning. Pt stating voices are commanding him to do different things. Pt is voluntary and wants to seek treatment.  Pt stating was seen here before for the same and was placed on medication that helped. Pt stating he feels the medication isn't effective anymore.

## 2020-07-04 NOTE — ED Notes (Addendum)
Pt dressed out with this RN and EDT. Following belongings:   1 lip ring 1 nose ring 1 black hat 1 Grey shirt 1 pair blue jeans 1 pair orange underwear  1 pair brown cowboy boots   1 black mask  1 pair grey socks  1 cellphone  1 wallet  2 vape pens  1 can of dip

## 2020-07-04 NOTE — ED Provider Notes (Signed)
[facility] Emergency Department Provider Note   ____________________________________________   I have reviewed the triage vital signs and the nursing notes.   HISTORY  Chief Complaint Psychiatric Evaluation   History limited by: Not Limited   HPI Corey [name] is a 34 y.o. male who presents to the emergency department today because of concerns for auditory hallucinations.  The patient was at work today when the voices started getting worse.  He states these voices are not demanding but they are condescending.  He denies that they are telling him to hurt himself.  He denies wanting to hurt himself.  He denies wanting to hurt others.  Records reviewed. Per medical record review patient has a history of bipolar, schizoaffective disorder.   Past Medical History:  Diagnosis Date  . Bipolar 1 disorder (HCC)   . GERD (gastroesophageal reflux disease)   . HTN (hypertension)   . Schizoaffective disorder (HCC)   . 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 Overdose of medication, intentional self-harm, initial encounter (HCC) 10/09/2018  . Bipolar 1 disorder, depressed, severe (HCC) 10/09/2018  . GERD (gastroesophageal reflux disease) 08/15/2018  . 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 Cannabis use disorder, moderate, dependence (HCC) 08/14/2018  . Psychosis (HCC) 06/29/2018  . Bipolar I disorder, current or most recent episode depressed, with psychotic features (HCC) 05/04/2018  . Syncope and collapse 04/29/2018  . Hypotension 04/29/2018  . Bradycardia 04/24/2018  . 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 Kidney stone 03/26/2018    Past Surgical History:  Procedure Laterality Date  . ESOPHAGOGASTRODUODENOSCOPY (EGD) WITH PROPOFOL N/A 04/26/2018   Procedure: ESOPHAGOGASTRODUODENOSCOPY (EGD) WITH PROPOFOL;  Surgeon: Wyline Mood, MD;  Location: [facility] ENDOSCOPY;   Service: Gastroenterology;  Laterality: N/A;  . KNEE ARTHROSCOPY    . PACEMAKER INSERTION Left 05/15/2018   Procedure: INSERTION PACEMAKER;  Surgeon: Marcina Millard, MD;  Location: [facility] ORS;  Service: Cardiovascular;  Laterality: Left;    Prior to Admission medications   Medication Sig Start Date End Date Taking? Authorizing Provider  Brexpiprazole (REXULTI) 3 MG TABS Take 3 mg by mouth at bedtime.    [provider]  clonazePAM (KLONOPIN) 1 MG tablet Take 1 mg by mouth 2 (two) times daily as needed for anxiety. 09/05/18   [provider]  dicyclomine (BENTYL) 10 MG capsule Take 1 capsule (10 mg total) by mouth 3 (three) times daily as needed for up to 5 days (abdominal cramping, diarrhea). 01/25/20 01/30/20  Dionne Bucy, MD  divalproex (DEPAKOTE ER) 500 MG 24 hr tablet Take 2 tablets (1,000 mg total) by mouth at bedtime. 06/16/20   [name], [name], MD  Doxepin HCl 6 MG TABS Take 6 mg by mouth Nightly. 10/04/18   [provider]  Eszopiclone (ESZOPICLONE) 3 MG TABS Take 3 mg by mouth at bedtime. Take immediately before bedtime    [provider]  FLUoxetine (PROZAC) 20 MG capsule Take 1 capsule (20 mg total) by mouth daily. 06/16/20   [name], [name], MD  hydrOXYzine (VISTARIL) 25 MG capsule Take 25-50 mg by mouth 4 (four) times daily as needed.    [provider]  nitroGLYCERIN (NITROSTAT) 0.4 MG SL tablet Place 1 tablet (0.4 mg total) under the tongue every 5 (five) minutes as needed for chest pain. 08/16/18 08/16/19  [name], Ellin Goodie, MD  ondansetron (ZOFRAN ODT) 8 MG disintegrating tablet Take 1 tablet (8 mg total)  by mouth every 8 (eight) hours as needed for nausea or vomiting. 01/25/20   Dionne Bucy, MD  pantoprazole (PROTONIX) 40 MG tablet Take 40 mg by mouth daily. 10/02/18   [provider]  QUEtiapine (SEROQUEL) 100 MG tablet Take 100 mg by mouth Nightly. 10/03/18   [provider]  risperiDONE (RISPERDAL) 2 MG  tablet Take 2 tablets (4 mg total) by mouth at bedtime. 06/16/20   [name], [name], MD    Allergies Penicillins and Sulfa antibiotics  Family History  Problem Relation Age of Onset  . Kidney cancer Neg Hx   . Bladder Cancer Neg Hx   . Prostate cancer Neg Hx     Social History Social History   Tobacco Use  . Smoking status: Former Games developer  . Smokeless tobacco: Current User    Types: Snuff  Vaping Use  . Vaping Use: Some days  Substance Use Topics  . Alcohol use: No  . Drug use: Not Currently    Types: Marijuana    Review of Systems Constitutional: No fever/chills Eyes: No visual changes. ENT: No sore throat. Cardiovascular: Denies chest pain. Respiratory: Denies shortness of breath. Gastrointestinal: No abdominal pain.  No nausea, no vomiting.  No diarrhea.   Genitourinary: Negative for dysuria. Musculoskeletal: Negative for back pain. Skin: Negative for rash. Neurological: Negative for headaches, focal weakness or numbness.  ____________________________________________   PHYSICAL EXAM:  VITAL SIGNS: ED Triage Vitals  Enc Vitals Group     BP 07/04/20 1916 (!) 131/95     Pulse Rate 07/04/20 1916 65     Resp 07/04/20 1916 18     Temp 07/04/20 1916 97.7 F (36.5 C)     Temp Source 07/04/20 1916 Oral     SpO2 07/04/20 1916 98 %     Weight 07/04/20 1911 258 lb 13.1 oz (117.4 kg)     Height 07/04/20 1911 5\' 11"  (1.803 m)     Head Circumference --      Peak Flow --      Pain Score 07/04/20 1911 0   Constitutional: Alert and oriented.  Eyes: Conjunctivae are normal.  ENT      Head: Normocephalic and atraumatic.      Nose: No congestion/rhinnorhea.      Mouth/Throat: Mucous membranes are moist.      Neck: No stridor. Hematological/Lymphatic/Immunilogical: No cervical lymphadenopathy. Cardiovascular: Normal rate, regular rhythm.  No murmurs, rubs, or gallops.  Respiratory: Normal respiratory effort without tachypnea nor retractions. Breath sounds are clear  and equal bilaterally. No wheezes/rales/rhonchi. Gastrointestinal: Soft and non tender. No rebound. No guarding.  Genitourinary: Deferred Musculoskeletal: Normal range of motion in all extremities. No lower extremity edema. Neurologic:  Normal speech and language. No gross focal neurologic deficits are appreciated.  Skin:  Skin is warm, dry and intact. No rash noted. Psychiatric: Mood and affect are normal. Speech and behavior are normal. Patient exhibits appropriate insight and judgment. Does not appear to be responding to internal stimuli.  ____________________________________________    LABS (pertinent positives/negatives)  UDS positive cannabinoid Ethanol, salicylate, acetaminophen below threshold CMP wnl except ca 8.8 CBC wbc 8.8, hgb 15.5, plt 267  ____________________________________________   EKG  None  ____________________________________________    RADIOLOGY  None  ____________________________________________   PROCEDURES  Procedures  ____________________________________________   INITIAL IMPRESSION / ASSESSMENT AND PLAN / ED COURSE  Pertinent labs & imaging results that were available during my care of the patient were reviewed by me and considered in my medical decision  making (see chart for details).   Patient presented to the emergency department today because of concerns for auditory hallucinations.  Patient denies any SI or HI.  He did not denies that these hallucinations are telling him to do any bed deeds.  I did offer to patient wait to speak to psychiatry however he felt comfortable being discharged.  He does have an appointment in 2 days.  Did discuss return precautions with patient.  ____________________________________________   FINAL CLINICAL IMPRESSION(S) / ED DIAGNOSES  Final diagnoses:  Auditory hallucination     Note: This dictation was prepared with Dragon dictation. Any transcriptional errors that result from this process are  unintentional     Phineas Semen, MD 07/04/20 2134

## 2020-07-08 ENCOUNTER — Other Ambulatory Visit: Payer: Self-pay | Admitting: Psychiatry

## 2020-08-02 ENCOUNTER — Encounter: Payer: Self-pay | Admitting: Emergency Medicine

## 2020-08-02 DIAGNOSIS — R112 Nausea with vomiting, unspecified: Secondary | ICD-10-CM | POA: Diagnosis present

## 2020-08-02 DIAGNOSIS — Z79899 Other long term (current) drug therapy: Secondary | ICD-10-CM | POA: Diagnosis not present

## 2020-08-02 DIAGNOSIS — Z87891 Personal history of nicotine dependence: Secondary | ICD-10-CM | POA: Diagnosis not present

## 2020-08-02 DIAGNOSIS — I10 Essential (primary) hypertension: Secondary | ICD-10-CM | POA: Insufficient documentation

## 2020-08-02 DIAGNOSIS — U071 COVID-19: Secondary | ICD-10-CM | POA: Diagnosis not present

## 2020-08-02 DIAGNOSIS — K625 Hemorrhage of anus and rectum: Secondary | ICD-10-CM | POA: Insufficient documentation

## 2020-08-02 DIAGNOSIS — Z950 Presence of cardiac pacemaker: Secondary | ICD-10-CM | POA: Insufficient documentation

## 2020-08-02 LAB — CBC
HCT: 45.3 % (ref 39.0–52.0)
Hemoglobin: 15.6 g/dL (ref 13.0–17.0)
MCH: 29.5 pg (ref 26.0–34.0)
MCHC: 34.4 g/dL (ref 30.0–36.0)
MCV: 85.6 fL (ref 80.0–100.0)
Platelets: 229 10*3/uL (ref 150–400)
RBC: 5.29 MIL/uL (ref 4.22–5.81)
RDW: 12.7 % (ref 11.5–15.5)
WBC: 4.4 10*3/uL (ref 4.0–10.5)
nRBC: 0 % (ref 0.0–0.2)

## 2020-08-02 LAB — COMPREHENSIVE METABOLIC PANEL
ALT: 30 U/L (ref 0–44)
AST: 38 U/L (ref 15–41)
Albumin: 4.4 g/dL (ref 3.5–5.0)
Alkaline Phosphatase: 61 U/L (ref 38–126)
Anion gap: 13 (ref 5–15)
BUN: 21 mg/dL — ABNORMAL HIGH (ref 6–20)
CO2: 23 mmol/L (ref 22–32)
Calcium: 8.7 mg/dL — ABNORMAL LOW (ref 8.9–10.3)
Chloride: 103 mmol/L (ref 98–111)
Creatinine, Ser: 1.19 mg/dL (ref 0.61–1.24)
GFR, Estimated: >60 mL/min (ref >60)
Glucose, Bld: 129 mg/dL — ABNORMAL HIGH (ref 70–99)
Potassium: 3.2 mmol/L — ABNORMAL LOW (ref 3.5–5.1)
Sodium: 139 mmol/L (ref 135–145)
Total Bilirubin: 0.9 mg/dL (ref 0.3–1.2)
Total Protein: 7.9 g/dL (ref 6.5–8.1)

## 2020-08-02 NOTE — ED Triage Notes (Signed)
Pt repots he has had GI bleeding as well as bright red blood in emesis. Pt has hx/o prior GI bleed due to ulcers. Pt reports taking ibuprofen and BC powders in 24 hour period on 12/29.

## 2020-08-03 ENCOUNTER — Emergency Department
Admission: EM | Admit: 2020-08-03 | Discharge: 2020-08-03 | Disposition: A | Discharge status: Home / Self Care | Payer: Commercial Managed Care - HMO | Attending: Emergency Medicine | Admitting: Emergency Medicine

## 2020-08-03 DIAGNOSIS — K2900 Acute gastritis without bleeding: Secondary | ICD-10-CM

## 2020-08-03 LAB — TROPONIN I (HIGH SENSITIVITY)
Troponin I (High Sensitivity): 5 ng/L (ref <18)
Troponin I (High Sensitivity): 5 ng/L (ref <18)

## 2020-08-03 LAB — TYPE AND SCREEN
ABO/RH(D): B POS
Antibody Screen: NEGATIVE

## 2020-08-03 MED ORDER — PANTOPRAZOLE SODIUM 40 MG PO TBEC: 40.0000 mg | DELAYED_RELEASE_TABLET | Freq: Every day | ORAL | 1 refills | Status: AC

## 2020-08-03 NOTE — ED Provider Notes (Signed)
[name] Emergency Department Provider Note  Time seen: 10:24 AM  I have reviewed the triage vital signs and the nursing notes.   HISTORY  Chief Complaint Hemoptysis and Rectal Bleeding   HPI Corey [name] is a 35 y.o. male with a past medical history of bipolar, gastric reflux, hypertension, schizoaffective disorder presents to the emergency department for rectal bleeding.  According to the patient for the past 4 days he has been feeling nauseated with occasional episodes of vomiting.  States he has been experiencing very dark to black stool.  States in the past he has had an ulceration due to ibuprofen use.  States he is currently using ibuprofen and BC powders on a near daily basis.  Denies any weakness or fatigue.  Patient states he had a subjective fever 2 days ago but denies any cough or shortness of breath.  Denies any dysuria or abdominal pain.   Past Medical History:  Diagnosis Date  . Bipolar 1 disorder ([place])   . GERD (gastroesophageal reflux disease)   . HTN (hypertension)   . Schizoaffective disorder ([place])   . 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 Saralyn Pilar    Patient Active Problem List   Diagnosis Date Noted  . Overdose of medication, intentional self-harm, initial encounter ([place]) 10/09/2018  . Bipolar 1 disorder, depressed, severe ([place]) 10/09/2018  . GERD (gastroesophageal reflux disease) 08/15/2018  . 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 Cannabis use disorder, moderate, dependence ([place]) 08/14/2018  . Psychosis ([place]) 06/29/2018  . Bipolar I disorder, current or most recent episode depressed, with psychotic features ([place]) 05/04/2018  . Syncope and collapse 04/29/2018  . Hypotension 04/29/2018  . Bradycardia 04/24/2018  . 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 Kidney stone 03/26/2018    Past Surgical History:  Procedure Laterality Date  . ESOPHAGOGASTRODUODENOSCOPY (EGD) WITH PROPOFOL N/A 04/26/2018    Procedure: ESOPHAGOGASTRODUODENOSCOPY (EGD) WITH PROPOFOL;  Surgeon: Jonathon Bellows, MD;  Location: [facility] ENDOSCOPY;  Service: Gastroenterology;  Laterality: N/A;  . KNEE ARTHROSCOPY    . PACEMAKER INSERTION Left 05/15/2018   Procedure: INSERTION PACEMAKER;  Surgeon: Isaias Cowman, MD;  Location: [facility] ORS;  Service: Cardiovascular;  Laterality: Left;    Prior to Admission medications   Medication Sig Start Date End Date Taking? Authorizing Provider  Brexpiprazole (REXULTI) 3 MG TABS Take 3 mg by mouth at bedtime.    [provider]  clonazePAM (KLONOPIN) 1 MG tablet Take 1 mg by mouth 2 (two) times daily as needed for anxiety. 09/05/18   [provider]  dicyclomine (BENTYL) 10 MG capsule Take 1 capsule (10 mg total) by mouth 3 (three) times daily as needed for up to 5 days (abdominal cramping, diarrhea). 01/25/20 01/30/20  Arta Silence, MD  divalproex (DEPAKOTE ER) 500 MG 24 hr tablet Take 2 tablets (1,000 mg total) by mouth at bedtime. 06/16/20   [name], Madie Reno, MD  Doxepin HCl 6 MG TABS Take 6 mg by mouth Nightly. 10/04/18   [provider]  Eszopiclone (ESZOPICLONE) 3 MG TABS Take 3 mg by mouth at bedtime. Take immediately before bedtime    [provider]  FLUoxetine (PROZAC) 20 MG capsule Take 1 capsule (20 mg total) by mouth daily. 06/16/20   [name], Madie Reno, MD  hydrOXYzine (VISTARIL) 25 MG capsule Take 25-50 mg by mouth 4 (four) times daily as needed.    [provider]  nitroGLYCERIN (NITROSTAT) 0.4 MG SL tablet Place 1 tablet (0.4 mg total) under the tongue  every 5 (five) minutes as needed for chest pain. 08/16/18 08/16/19  [name], Ellin Goodie, MD  ondansetron (ZOFRAN ODT) 8 MG disintegrating tablet Take 1 tablet (8 mg total) by mouth every 8 (eight) hours as needed for nausea or vomiting. 01/25/20   Dionne Bucy, MD  pantoprazole (PROTONIX) 40 MG tablet Take 40 mg by mouth daily. 10/02/18   [provider]  QUEtiapine  (SEROQUEL) 100 MG tablet Take 100 mg by mouth Nightly. 10/03/18   [provider]  risperiDONE (RISPERDAL) 2 MG tablet Take 2 tablets (4 mg total) by mouth at bedtime. 06/16/20   [name], [name], MD    Allergies  Allergen Reactions  . Penicillins Hives    Has patient had a PCN reaction causing immediate rash, facial/tongue/throat swelling, SOB or lightheadedness with hypotension: Yes Has patient had a PCN reaction causing severe rash involving mucus membranes or skin necrosis: No Has patient had a PCN reaction that required hospitalization: No Has patient had a PCN reaction occurring within the last 10 years: No If all of the above answers are "NO", then may proceed with Cephalosporin use.   . Sulfa Antibiotics Hives    Family History  Problem Relation Age of Onset  . Kidney cancer Neg Hx   . Bladder Cancer Neg Hx   . Prostate cancer Neg Hx     Social History Social History   Tobacco Use  . Smoking status: Former Games developer  . Smokeless tobacco: Current User    Types: Snuff  Vaping Use  . Vaping Use: Some days  Substance Use Topics  . Alcohol use: No  . Drug use: Not Currently    Types: Marijuana    Review of Systems Constitutional: Subjective fever 2 days ago ENT: Negative for recent illness/congestion Cardiovascular: Negative for chest pain. Respiratory: Negative for shortness of breath. Gastrointestinal: Negative for abdominal pain.  Positive for dark stool.  Occasional nausea vomiting Genitourinary: Negative for urinary compaints Musculoskeletal: Negative for musculoskeletal complaints Neurological: Negative for headache All other ROS negative  ____________________________________________   PHYSICAL EXAM:  VITAL SIGNS: ED Triage Vitals  Enc Vitals Group     BP 08/02/20 2308 (!) 152/100     Pulse Rate 08/02/20 2308 84     Resp 08/02/20 2308 18     Temp 08/02/20 2308 99.1 F (37.3 C)     Temp Source 08/02/20 2308 Oral     SpO2 08/02/20 2308 98 %      Weight --      Height --      Head Circumference --      Peak Flow --      Pain Score 08/03/20 0441 0     Pain Loc --      Pain Edu? --      Excl. in GC? --    Constitutional: Alert and oriented. Well appearing and in no distress. Eyes: Normal exam ENT      Head: Normocephalic and atraumatic.      Mouth/Throat: Mucous membranes are moist. Cardiovascular: Normal rate, regular rhythm.  Respiratory: Normal respiratory effort without tachypnea nor retractions. Breath sounds are clear  Gastrointestinal: Soft and nontender. No distention. Musculoskeletal: Nontender with normal range of motion in all extremities.  Neurologic:  Normal speech and language. No gross focal neurologic deficits  Skin:  Skin is warm, dry and intact.  Psychiatric: Mood and affect are normal.  ____________________________________________    EKG  EKG viewed and interpreted by myself shows normal sinus rhythm at 77  bpm with a narrow QRS, normal axis, normal intervals, no concerning ST changes.  ____________________________________________   INITIAL IMPRESSION / ASSESSMENT AND PLAN / ED COURSE  Pertinent labs & imaging results that were available during my care of the patient were reviewed by me and considered in my medical decision making (see chart for details).   Patient presents to the emergency department for dark stool nausea vomiting.  Benign abdominal exam.  Lab work is reassuring including a normal white blood cell count and normal H&H.  Rectal examination shows dark brown stool however guaiac negative.  Denies any iron or Pepto-Bismol use.  I did discuss with the patient stopping ibuprofen/BC powders and using Tylenol instead no more than 4 g/day.  Patient agreeable to plan of care.  We will also place the patient on Protonix for the next 2 months and refer to GI medicine for further evaluation.  Patient agreeable to plan of care.  Given the patient's subjective fever 2 days ago I did offer a Covid swab  patient would like to take the swab.  Corey [name] was evaluated in Emergency Department on 08/03/2020 for the symptoms described in the history of present illness. He was evaluated in the context of the global COVID-19 pandemic, which necessitated consideration that the patient might be at risk for infection with the SARS-CoV-2 virus that causes COVID-19. Institutional protocols and algorithms that pertain to the evaluation of patients at risk for COVID-19 are in a state of rapid change based on information released by regulatory bodies including the CDC and federal and state organizations. These policies and algorithms were followed during the patient's care in the ED.  ____________________________________________   FINAL CLINICAL IMPRESSION(S) / ED DIAGNOSES  Caprice Beaver   Minna Antis, MD 08/03/20 1027

## 2020-08-04 LAB — SARS CORONAVIRUS 2 (TAT 6-24 HRS): SARS Coronavirus 2: POSITIVE — AB

## 2020-08-10 ENCOUNTER — Telehealth: Payer: Self-pay | Admitting: Emergency Medicine

## 2020-08-10 ENCOUNTER — Other Ambulatory Visit: Payer: Self-pay | Admitting: Psychiatry

## 2020-08-10 NOTE — Telephone Encounter (Signed)
Patient called me back and he is aware of the covid positive as the health dept called him.

## 2020-08-10 NOTE — Telephone Encounter (Signed)
Called to assure that patient is aware of covid result.  Left message.

## 2020-08-24 ENCOUNTER — Emergency Department: Payer: Commercial Managed Care - HMO

## 2020-08-24 ENCOUNTER — Emergency Department
Admission: EM | Admit: 2020-08-24 | Discharge: 2020-08-24 | Disposition: A | Discharge status: Home / Self Care | Payer: Commercial Managed Care - HMO | Attending: Emergency Medicine | Admitting: Emergency Medicine

## 2020-08-24 ENCOUNTER — Other Ambulatory Visit: Payer: Self-pay

## 2020-08-24 DIAGNOSIS — I10 Essential (primary) hypertension: Secondary | ICD-10-CM | POA: Insufficient documentation

## 2020-08-24 DIAGNOSIS — X58XXXA Exposure to other specified factors, initial encounter: Secondary | ICD-10-CM | POA: Diagnosis not present

## 2020-08-24 DIAGNOSIS — S8991XA Unspecified injury of right lower leg, initial encounter: Secondary | ICD-10-CM | POA: Insufficient documentation

## 2020-08-24 DIAGNOSIS — Z87891 Personal history of nicotine dependence: Secondary | ICD-10-CM | POA: Insufficient documentation

## 2020-08-24 DIAGNOSIS — Z79899 Other long term (current) drug therapy: Secondary | ICD-10-CM | POA: Insufficient documentation

## 2020-08-24 NOTE — ED Triage Notes (Signed)
Pt presents via POV with c/o right knee pain. Pt reports "feeling pop" in knee last night before going to bed. Pt able to ambulate but with pain. Pt has hx of stress fractures in affected knee.

## 2020-08-24 NOTE — ED Provider Notes (Signed)
[facility] Emergency Department Provider Note  ____________________________________________  Time seen: Approximately 5:43 PM  I have reviewed the triage vital signs and the nursing notes.   HISTORY  Chief Complaint Knee Pain    HPI Corey [name] is a 35 y.o. male that presents to the emergency department for evaluation of right knee pain for 1 day.  Patient was going to bed last night when he felt a pop to the front of his knee.  He has had pain since.  He is able to move his knee and walk however without difficulty.  No swelling.  Patient had ACL surgery several years ago.   Past Medical History:  Diagnosis Date  . Bipolar 1 disorder ([facility])   . GERD (gastroesophageal reflux disease)   . HTN (hypertension)   . Schizoaffective disorder ([facility])   . 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 Overdose of medication, intentional self-harm, initial encounter ([facility]) 10/09/2018  . Bipolar 1 disorder, depressed, severe ([facility]) 10/09/2018  . GERD (gastroesophageal reflux disease) 08/15/2018  . 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 Cannabis use disorder, moderate, dependence ([facility]) 08/14/2018  . Psychosis ([facility]) 06/29/2018  . Bipolar I disorder, current or most recent episode depressed, with psychotic features ([facility]) 05/04/2018  . Syncope and collapse 04/29/2018  . Hypotension 04/29/2018  . Bradycardia 04/24/2018  . 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 Kidney stone 03/26/2018    Past Surgical History:  Procedure Laterality Date  . ESOPHAGOGASTRODUODENOSCOPY (EGD) WITH PROPOFOL N/A 04/26/2018   Procedure: ESOPHAGOGASTRODUODENOSCOPY (EGD) WITH PROPOFOL;  Surgeon: Wyline Mood, MD;  Location: [facility] ENDOSCOPY;  Service: Gastroenterology;  Laterality: N/A;  . KNEE ARTHROSCOPY    . PACEMAKER INSERTION Left 05/15/2018   Procedure: INSERTION PACEMAKER;  Surgeon: Marcina Millard, MD;  Location: [facility] ORS;  Service: Cardiovascular;  Laterality: Left;    Prior to Admission medications   Medication Sig Start Date End Date Taking? Authorizing Provider  Brexpiprazole (REXULTI) 3 MG TABS Take 3 mg by mouth at bedtime.    [provider]  clonazePAM (KLONOPIN) 1 MG tablet Take 1 mg by mouth 2 (two) times daily as needed for anxiety. 09/05/18   [provider]  dicyclomine (BENTYL) 10 MG capsule Take 1 capsule (10 mg total) by mouth 3 (three) times daily as needed for up to 5 days (abdominal cramping, diarrhea). 01/25/20 01/30/20  Dionne Bucy, MD  divalproex (DEPAKOTE ER) 500 MG 24 hr tablet Take 2 tablets (1,000 mg total) by mouth at bedtime. 06/16/20   [name], [name], MD  Doxepin HCl 6 MG TABS Take 6 mg by mouth Nightly. 10/04/18   [provider]  Eszopiclone (ESZOPICLONE) 3 MG TABS Take 3 mg by mouth at bedtime. Take immediately before bedtime    [provider]  FLUoxetine (PROZAC) 20 MG capsule Take 1 capsule (20 mg total) by mouth daily. 06/16/20   [name], [name], MD  hydrOXYzine (VISTARIL) 25 MG capsule Take 25-50 mg by mouth 4 (four) times daily as needed.    [provider]  nitroGLYCERIN (NITROSTAT) 0.4 MG SL tablet Place 1 tablet (0.4 mg total) under the tongue every 5 (five) minutes as needed for chest pain. 08/16/18 08/16/19  [name], Ellin Goodie, MD  ondansetron (ZOFRAN ODT) 8 MG disintegrating tablet Take 1 tablet (8 mg total) by mouth every 8 (eight) hours as needed for nausea or vomiting. 01/25/20   Siadecki,  Wilmon Pali, MD  pantoprazole (PROTONIX) 40 MG tablet Take 1 tablet (40 mg total) by mouth daily. 08/03/20 10/02/20  Minna Antis, MD  QUEtiapine (SEROQUEL) 100 MG tablet Take 100 mg by mouth Nightly. 10/03/18   [provider]  risperiDONE (RISPERDAL) 2 MG tablet Take 2 tablets (4 mg total) by mouth at bedtime. 06/16/20   [name], [name], MD    Allergies Penicillins and Sulfa  antibiotics  Family History  Problem Relation Age of Onset  . Kidney cancer Neg Hx   . Bladder Cancer Neg Hx   . Prostate cancer Neg Hx     Social History Social History   Tobacco Use  . Smoking status: Former Games developer  . Smokeless tobacco: Current User    Types: Snuff  Vaping Use  . Vaping Use: Some days  Substance Use Topics  . Alcohol use: No  . Drug use: Not Currently    Types: Marijuana     Review of Systems  Cardiovascular: No chest pain. Respiratory: No SOB. Gastrointestinal: No abdominal pain.  No nausea, no vomiting.  Musculoskeletal: Positive for knee pain. Skin: Negative for rash, abrasions, lacerations, ecchymosis. Neurological: Negative for headaches, numbness or tingling   ____________________________________________   PHYSICAL EXAM:  VITAL SIGNS: ED Triage Vitals  Enc Vitals Group     BP 08/24/20 1425 136/89     Pulse Rate 08/24/20 1425 82     Resp 08/24/20 1425 18     Temp 08/24/20 1425 97.8 F (36.6 C)     Temp Source 08/24/20 1425 Oral     SpO2 08/24/20 1425 99 %     Weight 08/24/20 1426 255 lb (115.7 kg)     Height 08/24/20 1426 5\' 11"  (1.803 m)     Head Circumference --      Peak Flow --      Pain Score 08/24/20 1435 3     Pain Loc --      Pain Edu? --      Excl. in GC? --      Constitutional: Alert and oriented. Well appearing and in no acute distress. Eyes: Conjunctivae are normal. PERRL. EOMI. Head: Atraumatic. ENT:      Ears:      Nose: No congestion/rhinnorhea.      Mouth/Throat: Mucous membranes are moist.  Neck: No stridor.   Cardiovascular: Normal rate, regular rhythm.  Good peripheral circulation. Respiratory: Normal respiratory effort without tachypnea or retractions. Lungs CTAB. Good air entry to the bases with no decreased or absent breath sounds. Musculoskeletal: Full range of motion to all extremities. No gross deformities appreciated. Full ROM of right knee. Able to extend right knee and flex at the hip without  difficulty or assistance. No swelling. Neurologic:  Normal speech and language. No gross focal neurologic deficits are appreciated.  Skin:  Skin is warm, dry and intact. No rash noted. Psychiatric: Mood and affect are normal. Speech and behavior are normal. Patient exhibits appropriate insight and judgement.   ____________________________________________   LABS (all labs ordered are listed, but only abnormal results are displayed)  Labs Reviewed - No data to display ____________________________________________  EKG   ____________________________________________  RADIOLOGY 08/26/20, personally viewed and evaluated these images (plain radiographs) as part of my medical decision making, as well as reviewing the written report by the radiologist.  DG Knee Complete 4 Views Right  Result Date: 08/24/2020 CLINICAL DATA:  35 year old male with trauma to the right knee. EXAM: RIGHT KNEE - COMPLETE 4+ VIEW COMPARISON:  None. FINDINGS: No evidence of fracture, dislocation, or joint effusion. No evidence of arthropathy or other focal bone abnormality. Soft tissues are unremarkable. IMPRESSION: Negative. Electronically Signed   By: Elgie Collard M.D.   On: 08/24/2020 15:04    ____________________________________________    PROCEDURES  Procedure(s) performed:    Procedures    Medications - No data to display   ____________________________________________   INITIAL IMPRESSION / ASSESSMENT AND PLAN / ED COURSE  Pertinent labs & imaging results that were available during my care of the patient were reviewed by me and considered in my medical decision making (see chart for details).  Review of the [place] CSRS was performed in accordance of the [facility] prior to dispensing any controlled drugs.   Patient presented to the emergency department for evaluation of knee pain.  Vital signs and exam are reassuring.  Knee x-ray is negative for acute abnormality.  Patient has full range  of motion of his knee without any swelling.  He is up walking in the emergency department without difficulty.  Knee brace was placed for soft tissue injury by his request.  Crutches were given.  Patient is to follow up with orthopedics as directed. Patient is given ED precautions to return to the ED for any worsening or new symptoms.   Rahm Alisha Burgo was evaluated in Emergency Department on 08/24/2020 for the symptoms described in the history of present illness. He was evaluated in the context of the global COVID-19 pandemic, which necessitated consideration that the patient might be at risk for infection with the SARS-CoV-2 virus that causes COVID-19. Institutional protocols and algorithms that pertain to the evaluation of patients at risk for COVID-19 are in a state of rapid change based on information released by regulatory bodies including the CDC and federal and state organizations. These policies and algorithms were followed during the patient's care in the ED.  ____________________________________________  FINAL CLINICAL IMPRESSION(S) / ED DIAGNOSES  Final diagnoses:  Injury of right knee, initial encounter      NEW MEDICATIONS STARTED DURING THIS VISIT:  ED Discharge Orders    None          This chart was dictated using voice recognition software/Dragon. Despite best efforts to proofread, errors can occur which can change the meaning. Any change was purely unintentional.    Enid Derry, PA-C 08/24/20 2353    Dionne Bucy, MD 09/09/20 [phone]

## 2020-09-18 ENCOUNTER — Encounter: Payer: Self-pay | Admitting: Gastroenterology

## 2020-09-18 ENCOUNTER — Ambulatory Visit: Payer: Commercial Managed Care - HMO | Admitting: Gastroenterology

## 2020-09-30 ENCOUNTER — Emergency Department
Admission: EM | Admit: 2020-09-30 | Discharge: 2020-09-30 | Disposition: A | Discharge status: Home / Self Care | Payer: Commercial Managed Care - HMO | Attending: Emergency Medicine | Admitting: Emergency Medicine

## 2020-09-30 ENCOUNTER — Emergency Department: Payer: Commercial Managed Care - HMO

## 2020-09-30 ENCOUNTER — Other Ambulatory Visit: Payer: Self-pay

## 2020-09-30 DIAGNOSIS — M79671 Pain in right foot: Secondary | ICD-10-CM | POA: Diagnosis present

## 2020-09-30 DIAGNOSIS — Z950 Presence of cardiac pacemaker: Secondary | ICD-10-CM | POA: Diagnosis not present

## 2020-09-30 DIAGNOSIS — Z87891 Personal history of nicotine dependence: Secondary | ICD-10-CM | POA: Diagnosis not present

## 2020-09-30 DIAGNOSIS — M10071 Idiopathic gout, right ankle and foot: Secondary | ICD-10-CM | POA: Diagnosis not present

## 2020-09-30 DIAGNOSIS — I10 Essential (primary) hypertension: Secondary | ICD-10-CM | POA: Diagnosis not present

## 2020-09-30 DIAGNOSIS — Z79899 Other long term (current) drug therapy: Secondary | ICD-10-CM | POA: Diagnosis not present

## 2020-09-30 LAB — CBC WITH DIFFERENTIAL/PLATELET
Abs Immature Granulocytes: 0.03 10*3/uL (ref 0.00–0.07)
Basophils Absolute: 0.1 10*3/uL (ref 0.0–0.1)
Basophils Relative: 1 %
Eosinophils Absolute: 0.4 10*3/uL (ref 0.0–0.5)
Eosinophils Relative: 5 %
HCT: 45.5 % (ref 39.0–52.0)
Hemoglobin: 15.6 g/dL (ref 13.0–17.0)
Immature Granulocytes: 0 %
Lymphocytes Relative: 23 %
Lymphs Abs: 1.8 10*3/uL (ref 0.7–4.0)
MCH: 30.4 pg (ref 26.0–34.0)
MCHC: 34.3 g/dL (ref 30.0–36.0)
MCV: 88.7 fL (ref 80.0–100.0)
Monocytes Absolute: 0.5 10*3/uL (ref 0.1–1.0)
Monocytes Relative: 6 %
Neutro Abs: 5.2 10*3/uL (ref 1.7–7.7)
Neutrophils Relative %: 65 %
Platelets: 247 10*3/uL (ref 150–400)
RBC: 5.13 MIL/uL (ref 4.22–5.81)
RDW: 12.8 % (ref 11.5–15.5)
WBC: 7.9 10*3/uL (ref 4.0–10.5)
nRBC: 0 % (ref 0.0–0.2)

## 2020-09-30 LAB — BASIC METABOLIC PANEL
Anion gap: 10 (ref 5–15)
BUN: 14 mg/dL (ref 6–20)
CO2: 24 mmol/L (ref 22–32)
Calcium: 8.9 mg/dL (ref 8.9–10.3)
Chloride: 105 mmol/L (ref 98–111)
Creatinine, Ser: 0.96 mg/dL (ref 0.61–1.24)
GFR, Estimated: >60 mL/min (ref >60)
Glucose, Bld: 98 mg/dL (ref 70–99)
Potassium: 3.7 mmol/L (ref 3.5–5.1)
Sodium: 139 mmol/L (ref 135–145)

## 2020-09-30 LAB — URIC ACID: Uric Acid, Serum: 9.1 mg/dL — ABNORMAL HIGH (ref 3.7–8.6)

## 2020-09-30 MED ORDER — NAPROXEN 500 MG PO TABS
500.0000 mg | ORAL_TABLET | Freq: Once | ORAL | Status: AC
  Administered 2020-09-30: 500 mg via ORAL
  Filled 2020-09-30: qty 1

## 2020-09-30 MED ORDER — COLCHICINE 0.6 MG PO TABS
1.2000 mg | ORAL_TABLET | Freq: Once | ORAL | Status: AC
  Administered 2020-09-30: 1.2 mg via ORAL
  Filled 2020-09-30: qty 2

## 2020-09-30 MED ORDER — INDOMETHACIN 50 MG PO CAPS: 50.0000 mg | ORAL_CAPSULE | Freq: Three times a day (TID) | ORAL | 0 refills | Status: DC

## 2020-09-30 MED ORDER — COLCHICINE 0.6 MG PO TABS: 0.6000 mg | ORAL_TABLET | Freq: Every day | ORAL | 0 refills | Status: DC

## 2020-09-30 NOTE — Discharge Instructions (Addendum)
Follow discharge care instruction take medication as directed. Establish care with PCP.

## 2020-09-30 NOTE — ED Provider Notes (Signed)
[facility] Emergency Department Provider Note   ____________________________________________   Event Date/Time   First MD Initiated Contact with Patient 09/30/20 1101     (approximate)  I have reviewed the triage vital signs and the nursing notes.   HISTORY  Chief Complaint Foot Injury    HPI Annie Alma Muegge is a 35 y.o. male patient presents with right foot pain confined mostly to the great toe secondary to injury a few weeks ago.  Patient state he dropped a large vape on his foot.  Patient state intimating pain and edema.  Patient is a complaint worsens last night but feels better today.  Denies loss of function or loss of sensation.  Described pain as "achy".  Rates pain as 7/10.  No palliative measure for complaint.         Past Medical History:  Diagnosis Date  . Bipolar 1 disorder (HCC)   . GERD (gastroesophageal reflux disease)   . HTN (hypertension)   . Schizoaffective disorder (HCC)   . 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 Overdose of medication, intentional self-harm, initial encounter (HCC) 10/09/2018  . Bipolar 1 disorder, depressed, severe (HCC) 10/09/2018  . GERD (gastroesophageal reflux disease) 08/15/2018  . 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 Cannabis use disorder, moderate, dependence (HCC) 08/14/2018  . Psychosis (HCC) 06/29/2018  . Bipolar I disorder, current or most recent episode depressed, with psychotic features (HCC) 05/04/2018  . Syncope and collapse 04/29/2018  . Hypotension 04/29/2018  . Bradycardia 04/24/2018  . 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 Kidney stone 03/26/2018    Past Surgical History:  Procedure Laterality Date  . ESOPHAGOGASTRODUODENOSCOPY (EGD) WITH PROPOFOL N/A 04/26/2018   Procedure: ESOPHAGOGASTRODUODENOSCOPY (EGD) WITH PROPOFOL;  Surgeon: Wyline Mood, MD;  Location: [facility] ENDOSCOPY;  Service:  Gastroenterology;  Laterality: N/A;  . KNEE ARTHROSCOPY    . PACEMAKER INSERTION Left 05/15/2018   Procedure: INSERTION PACEMAKER;  Surgeon: Marcina Millard, MD;  Location: [facility] ORS;  Service: Cardiovascular;  Laterality: Left;    Prior to Admission medications   Medication Sig Start Date End Date Taking? Authorizing Provider  colchicine 0.6 MG tablet Take 1 tablet (0.6 mg total) by mouth daily. 09/30/20 09/30/21 Yes Joni Reining, PA-C  indomethacin (INDOCIN) 50 MG capsule Take 1 capsule (50 mg total) by mouth 3 (three) times daily with meals. 09/30/20  Yes Joni Reining, PA-C  Brexpiprazole (REXULTI) 3 MG TABS Take 3 mg by mouth at bedtime.    [provider]  clonazePAM (KLONOPIN) 1 MG tablet Take 1 mg by mouth 2 (two) times daily as needed for anxiety. 09/05/18   [provider]  dicyclomine (BENTYL) 10 MG capsule Take 1 capsule (10 mg total) by mouth 3 (three) times daily as needed for up to 5 days (abdominal cramping, diarrhea). 01/25/20 01/30/20  Dionne Bucy, MD  divalproex (DEPAKOTE ER) 500 MG 24 hr tablet Take 2 tablets (1,000 mg total) by mouth at bedtime. 06/16/20   [name], [name], MD  Doxepin HCl 6 MG TABS Take 6 mg by mouth Nightly. 10/04/18   [provider]  Eszopiclone (ESZOPICLONE) 3 MG TABS Take 3 mg by mouth at bedtime. Take immediately before bedtime    [provider]  FLUoxetine (PROZAC) 20 MG capsule Take 1 capsule (20 mg total) by mouth daily. 06/16/20   [name], [name], MD  hydrOXYzine (VISTARIL) 25 MG capsule  Take 25-50 mg by mouth 4 (four) times daily as needed.    [provider]  nitroGLYCERIN (NITROSTAT) 0.4 MG SL tablet Place 1 tablet (0.4 mg total) under the tongue every 5 (five) minutes as needed for chest pain. 08/16/18 08/16/19  [name], Ellin Goodie, MD  ondansetron (ZOFRAN ODT) 8 MG disintegrating tablet Take 1 tablet (8 mg total) by mouth every 8 (eight) hours as needed for nausea or vomiting. 01/25/20    Dionne Bucy, MD  pantoprazole (PROTONIX) 40 MG tablet Take 1 tablet (40 mg total) by mouth daily. 08/03/20 10/02/20  Minna Antis, MD  QUEtiapine (SEROQUEL) 100 MG tablet Take 100 mg by mouth Nightly. 10/03/18   [provider]  risperiDONE (RISPERDAL) 2 MG tablet Take 2 tablets (4 mg total) by mouth at bedtime. 06/16/20   [name], [name], MD    Allergies Penicillins and Sulfa antibiotics  Family History  Problem Relation Age of Onset  . Kidney cancer Neg Hx   . Bladder Cancer Neg Hx   . Prostate cancer Neg Hx     Social History Social History   Tobacco Use  . Smoking status: Former Games developer  . Smokeless tobacco: Current User    Types: Snuff  Vaping Use  . Vaping Use: Some days  Substance Use Topics  . Alcohol use: No  . Drug use: Not Currently    Types: Marijuana    Review of Systems Constitutional: No fever/chills Eyes: No visual changes. ENT: No sore throat. Cardiovascular: Denies chest pain. Respiratory: Denies shortness of breath. Gastrointestinal: No abdominal pain.  No nausea, no vomiting.  No diarrhea.  No constipation.  History of GERD. Genitourinary: Negative for dysuria. Musculoskeletal: Negative for back pain. Skin: Negative for rash. Neurological: Negative for headaches, focal weakness or numbness. Psychiatric: Cannabis use disorder,  Bipolar; psychosis, and schizoaffective disorder Allergic/Immunilogical: Penicillin and sulfa. ____________________________________________   PHYSICAL EXAM:  VITAL SIGNS: ED Triage Vitals  Enc Vitals Group     BP 09/30/20 1036 130/87     Pulse Rate 09/30/20 1036 82     Resp 09/30/20 1036 18     Temp 09/30/20 1036 (!) 97.5 F (36.4 C)     Temp src --      SpO2 09/30/20 1036 100 %     Weight 09/30/20 1037 250 lb (113.4 kg)     Height 09/30/20 1037 5\' 11"  (1.803 m)     Head Circumference --      Peak Flow --      Pain Score 09/30/20 1036 7     Pain Loc --      Pain Edu? --      Excl. in GC? --     Constitutional: Alert and oriented. Well appearing and in no acute distress. Eyes: Conjunctivae are normal. PERRL. EOMI. Head: Atraumatic. Nose: No congestion/rhinnorhea. Mouth/Throat: Mucous membranes are moist.  Oropharynx non-erythematous. Neck: No stridor.  Hematological/Lymphatic/Immunilogical: No cervical lymphadenopathy. }Cardiovascular: Normal rate, regular rhythm. Grossly normal heart sounds.  Good peripheral circulation. Respiratory: Normal respiratory effort.  No retractions. Lungs CTAB. Gastrointestinal: Soft and nontender. No distention. No abdominal bruits. No CVA tenderness. Musculoskeletal: No lower extremity tenderness nor edema.  No joint effusions. Neurologic:  Normal speech and language. No gross focal neurologic deficits are appreciated. No gait instability. Skin:  Skin is warm, dry and intact. No rash noted. Psychiatric: Mood and affect are normal. Speech and behavior are normal.  ____________________________________________   LABS (all labs ordered are listed, but only abnormal results are displayed)  Labs Reviewed  URIC ACID - Abnormal; Notable for the following components:      Result Value   Uric Acid, Serum 9.1 (*)    All other components within normal limits  BASIC METABOLIC PANEL  CBC WITH DIFFERENTIAL/PLATELET   ____________________________________________  EKG   ____________________________________________  RADIOLOGY I, Joni Reining, personally viewed and evaluated these images (plain radiographs) as part of my medical decision making, as well as reviewing the written report by the radiologist.  ED MD interpretation: No acute findings x-ray of the right foot. Official radiology report(s): DG Foot Complete Right  Result Date: 09/30/2020 CLINICAL DATA:  Pain and edema.  Contusion 2 weeks ago. EXAM: RIGHT FOOT COMPLETE - 3+ VIEW COMPARISON:  None. FINDINGS: There is no evidence of fracture or dislocation. There is no evidence of arthropathy or  other focal bone abnormality. Soft tissues are unremarkable. IMPRESSION: Negative right foot radiographs. Electronically Signed   By: Marin Roberts M.D.   On: 09/30/2020 11:46    ____________________________________________   PROCEDURES  Procedure(s) performed (including Critical Care):  Procedures   ____________________________________________   INITIAL IMPRESSION / ASSESSMENT AND PLAN / ED COURSE  As part of my medical decision making, I reviewed the following data within the electronic MEDICAL RECORD NUMBER         Patient presents with right foot pain which he believes is secondary to to contusion several weeks ago. Discussed no acute findings on x-ray. Discussed lab results showing elevated uric acid levels. Patient complaint physical exam consistent with gout. Patient given discharge care instruction advised take medication as directed. Patient [facility].      ____________________________________________   FINAL CLINICAL IMPRESSION(S) / ED DIAGNOSES  Final diagnoses:  Acute idiopathic gout involving toe of right foot     ED Discharge Orders         Ordered    colchicine 0.6 MG tablet  Daily        09/30/20 1315    indomethacin (INDOCIN) 50 MG capsule  3 times daily with meals        09/30/20 1315          *Please note:  [name] was evaluated in Emergency Department on 09/30/2020 for the symptoms described in the history of present illness. He was evaluated in the context of the global COVID-19 pandemic, which necessitated consideration that the patient might be at risk for infection with the SARS-CoV-2 virus that causes COVID-19. Institutional protocols and algorithms that pertain to the evaluation of patients at risk for COVID-19 are in a state of rapid change based on information released by regulatory bodies including the CDC and federal and state organizations. These policies and algorithms were followed during the  patient's care in the ED.  Some ED evaluations and interventions may be delayed as a result of limited staffing during and the pandemic.*   Note:  This document was prepared using Dragon voice recognition software and may include unintentional dictation errors.    Joni Reining, PA-C 09/30/20 1318    Merwyn Katos, MD 09/30/20 1352

## 2020-09-30 NOTE — ED Notes (Addendum)
See triage note  Presents with pain to right foot  States he dropped something on his foot several weeks ago  Foot is swollen and tender  Redness is noted mainly at great toe

## 2020-09-30 NOTE — ED Triage Notes (Signed)
Pt comes with c/o right foot pain following injury few weeks back. Pt states he dropped a vape on it and it got better at first. Pt states now it is black and blue and swollen.  Pt states painful to walk on.

## 2020-10-22 ENCOUNTER — Other Ambulatory Visit: Payer: Self-pay | Admitting: Physician Assistant

## 2020-11-23 ENCOUNTER — Emergency Department
Admission: EM | Admit: 2020-11-23 | Discharge: 2020-11-23 | Disposition: A | Discharge status: Home / Self Care | Payer: Commercial Managed Care - HMO | Attending: Physician Assistant | Admitting: Physician Assistant

## 2020-11-23 ENCOUNTER — Other Ambulatory Visit: Payer: Self-pay

## 2020-11-23 ENCOUNTER — Encounter: Payer: Self-pay | Admitting: Emergency Medicine

## 2020-11-23 DIAGNOSIS — M10071 Idiopathic gout, right ankle and foot: Secondary | ICD-10-CM | POA: Insufficient documentation

## 2020-11-23 DIAGNOSIS — Z79899 Other long term (current) drug therapy: Secondary | ICD-10-CM | POA: Diagnosis not present

## 2020-11-23 DIAGNOSIS — M79671 Pain in right foot: Secondary | ICD-10-CM | POA: Diagnosis present

## 2020-11-23 DIAGNOSIS — Z87891 Personal history of nicotine dependence: Secondary | ICD-10-CM | POA: Insufficient documentation

## 2020-11-23 DIAGNOSIS — I10 Essential (primary) hypertension: Secondary | ICD-10-CM | POA: Diagnosis not present

## 2020-11-23 MED ORDER — COLCHICINE 0.6 MG PO TABS: 0.6000 mg | ORAL_TABLET | Freq: Two times a day (BID) | ORAL | 2 refills | Status: DC

## 2020-11-23 MED ORDER — NAPROXEN 500 MG PO TABS
500.0000 mg | ORAL_TABLET | Freq: Once | ORAL | Status: DC
  Filled 2020-11-23: qty 1

## 2020-11-23 MED ORDER — INDOMETHACIN 50 MG PO CAPS: 50.0000 mg | ORAL_CAPSULE | Freq: Three times a day (TID) | ORAL | 0 refills | Status: DC

## 2020-11-23 MED ORDER — COLCHICINE 0.6 MG PO TABS
1.2000 mg | ORAL_TABLET | Freq: Once | ORAL | Status: AC
  Administered 2020-11-23: 1.2 mg via ORAL
  Filled 2020-11-23: qty 2

## 2020-11-23 NOTE — ED Provider Notes (Signed)
[facility] Emergency Department Provider Note   ____________________________________________   Event Date/Time   First MD Initiated Contact with Patient 11/23/20 1008     (approximate)  I have reviewed the triage vital signs and the nursing notes.   HISTORY  Chief Complaint Foot Pain    HPI Corey [name] is a 35 y.o. male patient returns to ED after 1 month complaining of pain to the right foot and great toe.  Patient has a history of gout.  Patient uric acid level last month was 9.1.  Patient states he received a marked relief with colchicine.  Patient said after stopping the medicine his pain gradually returned.  Rates pain as a 7/10.  Described pain as "achy".      Past Medical History:  Diagnosis Date  . Bipolar 1 disorder (HCC)   . GERD (gastroesophageal reflux disease)   . HTN (hypertension)   . Schizoaffective disorder (HCC)   . 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 Overdose of medication, intentional self-harm, initial encounter (HCC) 10/09/2018  . Bipolar 1 disorder, depressed, severe (HCC) 10/09/2018  . GERD (gastroesophageal reflux disease) 08/15/2018  . 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 Cannabis use disorder, moderate, dependence (HCC) 08/14/2018  . Psychosis (HCC) 06/29/2018  . Bipolar I disorder, current or most recent episode depressed, with psychotic features (HCC) 05/04/2018  . Syncope and collapse 04/29/2018  . Hypotension 04/29/2018  . Bradycardia 04/24/2018  . 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 Kidney stone 03/26/2018    Past Surgical History:  Procedure Laterality Date  . ESOPHAGOGASTRODUODENOSCOPY (EGD) WITH PROPOFOL N/A 04/26/2018   Procedure: ESOPHAGOGASTRODUODENOSCOPY (EGD) WITH PROPOFOL;  Surgeon: Wyline Mood, MD;  Location: [facility] ENDOSCOPY;  Service: Gastroenterology;  Laterality: N/A;  . KNEE ARTHROSCOPY     . PACEMAKER INSERTION Left 05/15/2018   Procedure: INSERTION PACEMAKER;  Surgeon: Marcina Millard, MD;  Location: [facility] ORS;  Service: Cardiovascular;  Laterality: Left;    Prior to Admission medications   Medication Sig Start Date End Date Taking? Authorizing Provider  colchicine 0.6 MG tablet Take 1 tablet (0.6 mg total) by mouth 2 (two) times daily. 11/23/20 11/23/21 Yes Joni Reining, PA-C  indomethacin (INDOCIN) 50 MG capsule Take 1 capsule (50 mg total) by mouth 3 (three) times daily with meals. 11/23/20  Yes Joni Reining, PA-C  Brexpiprazole (REXULTI) 3 MG TABS Take 3 mg by mouth at bedtime.    [provider]  clonazePAM (KLONOPIN) 1 MG tablet Take 1 mg by mouth 2 (two) times daily as needed for anxiety. 09/05/18   [provider]  colchicine 0.6 MG tablet Take 1 tablet (0.6 mg total) by mouth daily. 09/30/20 09/30/21  Joni Reining, PA-C  dicyclomine (BENTYL) 10 MG capsule Take 1 capsule (10 mg total) by mouth 3 (three) times daily as needed for up to 5 days (abdominal cramping, diarrhea). 01/25/20 01/30/20  Dionne Bucy, MD  divalproex (DEPAKOTE ER) 500 MG 24 hr tablet Take 2 tablets (1,000 mg total) by mouth at bedtime. 06/16/20   [name], [name], MD  Doxepin HCl 6 MG TABS Take 6 mg by mouth Nightly. 10/04/18   [provider]  Eszopiclone (ESZOPICLONE) 3 MG TABS Take 3 mg by mouth at bedtime. Take immediately before bedtime    [provider]  FLUoxetine (PROZAC) 20 MG capsule Take 1 capsule (20 mg total) by mouth daily. 06/16/20  [name], [name], MD  hydrOXYzine (VISTARIL) 25 MG capsule Take 25-50 mg by mouth 4 (four) times daily as needed.    [provider]  indomethacin (INDOCIN) 50 MG capsule Take 1 capsule (50 mg total) by mouth 3 (three) times daily with meals. 09/30/20   Joni Reining, PA-C  nitroGLYCERIN (NITROSTAT) 0.4 MG SL tablet Place 1 tablet (0.4 mg total) under the tongue every 5 (five) minutes as needed for chest  pain. 08/16/18 08/16/19  [name], Ellin Goodie, MD  ondansetron (ZOFRAN ODT) 8 MG disintegrating tablet Take 1 tablet (8 mg total) by mouth every 8 (eight) hours as needed for nausea or vomiting. 01/25/20   Dionne Bucy, MD  pantoprazole (PROTONIX) 40 MG tablet Take 1 tablet (40 mg total) by mouth daily. 08/03/20 10/02/20  Minna Antis, MD  QUEtiapine (SEROQUEL) 100 MG tablet Take 100 mg by mouth Nightly. 10/03/18   [provider]  risperiDONE (RISPERDAL) 2 MG tablet Take 2 tablets (4 mg total) by mouth at bedtime. 06/16/20   [name], [name], MD    Allergies Penicillins and Sulfa antibiotics  Family History  Problem Relation Age of Onset  . Kidney cancer Neg Hx   . Bladder Cancer Neg Hx   . Prostate cancer Neg Hx     Social History Social History   Tobacco Use  . Smoking status: Former Games developer  . Smokeless tobacco: Current User    Types: Snuff  Vaping Use  . Vaping Use: Some days  Substance Use Topics  . Alcohol use: No  . Drug use: Not Currently    Types: Marijuana    Review of Systems  Constitutional: No fever/chills Eyes: No visual changes. ENT: No sore throat. Cardiovascular: Denies chest pain. Respiratory: Denies shortness of breath. Gastrointestinal: No abdominal pain.  No nausea, no vomiting.  No diarrhea.  No constipation. Genitourinary: Negative for dysuria. Musculoskeletal: Right foot pain.   Skin: Negative for rash. Neurological: Negative for headaches, focal weakness or numbness. Psychiatric:  Bipolar Endocrine:  Gout and hypertension. Allergic/Immunilogical: Penicillin and sulfa. ____________________________________________   PHYSICAL EXAM:  VITAL SIGNS: ED Triage Vitals  Enc Vitals Group     BP 11/23/20 0949 130/86     Pulse Rate 11/23/20 0949 73     Resp 11/23/20 0949 16     Temp 11/23/20 0949 97.7 F (36.5 C)     Temp Source 11/23/20 0949 Oral     SpO2 11/23/20 0949 97 %     Weight 11/23/20 0941 250 lb (113.4 kg)     Height  11/23/20 0941 5\' 11"  (1.803 m)     Head Circumference --      Peak Flow --      Pain Score 11/23/20 0941 7     Pain Loc --      Pain Edu? --      Excl. in GC? --    Constitutional: Alert and oriented. Well appearing and in no acute distress. Cardiovascular: Normal rate, regular rhythm. Grossly normal heart sounds.  Good peripheral circulation. Respiratory: Normal respiratory effort.  No retractions. Lungs CTAB. Musculoskeletal: No obvious deformity to right foot/toe.  Moderate edema is appreciated.  Guarding with palpation. Neurologic:  Normal speech and language. No gross focal neurologic deficits are appreciated. No gait instability. Skin:  Skin is warm, dry and intact. No rash noted. Psychiatric: Mood and affect are normal. Speech and behavior are normal.  ____________________________________________   LABS (all labs ordered are listed, but only abnormal results are displayed)  Labs Reviewed - No data to display ____________________________________________  EKG   ____________________________________________  RADIOLOGY I, Joni Reining, personally viewed and evaluated these images (plain radiographs) as part of my medical decision making, as well as reviewing the written report by the radiologist.  ED MD interpretation:    Official radiology report(s): No results found.  ____________________________________________   PROCEDURES  Procedure(s) performed (including Critical Care):  Procedures   ____________________________________________   INITIAL IMPRESSION / ASSESSMENT AND PLAN / ED COURSE  As part of my medical decision making, I reviewed the following data within the electronic MEDICAL RECORD NUMBER         Patient presents with right foot, ankle, and great toe pain.  Denies history of gout and patient states pain is similar.  Patient given colchicine and naproxen prior to departure.  Patient given discharge care instruction refill colchicine along with  indomethacin.  Patient [facility]      ____________________________________________   FINAL CLINICAL IMPRESSION(S) / ED DIAGNOSES  Final diagnoses:  Acute idiopathic gout involving toe of right foot     ED Discharge Orders         Ordered    colchicine 0.6 MG tablet  2 times daily        11/23/20 1014    indomethacin (INDOCIN) 50 MG capsule  3 times daily with meals        11/23/20 1014          *Please note:  [name] was evaluated in Emergency Department on 11/23/2020 for the symptoms described in the history of present illness. He was evaluated in the context of the global COVID-19 pandemic, which necessitated consideration that the patient might be at risk for infection with the SARS-CoV-2 virus that causes COVID-19. Institutional protocols and algorithms that pertain to the evaluation of patients at risk for COVID-19 are in a state of rapid change based on information released by regulatory bodies including the CDC and federal and state organizations. These policies and algorithms were followed during the patient's care in the ED.  Some ED evaluations and interventions may be delayed as a result of limited staffing during and the pandemic.*   Note:  This document was prepared using Dragon voice recognition software and may include unintentional dictation errors.    Joni Reining, PA-C 11/23/20 1021    Delton Prairie, MD 11/23/20 1336

## 2020-11-23 NOTE — Discharge Instructions (Addendum)
Follow discharge care instruction take medication as directed. °

## 2020-11-23 NOTE — ED Triage Notes (Signed)
Pt reports gout flare up to right foot. Pt reports was seen here for the same a while back and the medication we gave worked well and he needs that same medication

## 2020-12-22 ENCOUNTER — Emergency Department
Admission: EM | Admit: 2020-12-22 | Discharge: 2020-12-22 | Disposition: A | Discharge status: Home / Self Care | Payer: Commercial Managed Care - HMO | Attending: Emergency Medicine | Admitting: Emergency Medicine

## 2020-12-22 ENCOUNTER — Encounter: Payer: Self-pay | Admitting: Emergency Medicine

## 2020-12-22 ENCOUNTER — Other Ambulatory Visit: Payer: Self-pay

## 2020-12-22 DIAGNOSIS — I10 Essential (primary) hypertension: Secondary | ICD-10-CM | POA: Insufficient documentation

## 2020-12-22 DIAGNOSIS — I951 Orthostatic hypotension: Secondary | ICD-10-CM | POA: Diagnosis not present

## 2020-12-22 DIAGNOSIS — R42 Dizziness and giddiness: Secondary | ICD-10-CM | POA: Diagnosis present

## 2020-12-22 DIAGNOSIS — Z87891 Personal history of nicotine dependence: Secondary | ICD-10-CM | POA: Diagnosis not present

## 2020-12-22 DIAGNOSIS — Z950 Presence of cardiac pacemaker: Secondary | ICD-10-CM | POA: Diagnosis not present

## 2020-12-22 LAB — BASIC METABOLIC PANEL
Anion gap: 8 (ref 5–15)
BUN: 10 mg/dL (ref 6–20)
CO2: 24 mmol/L (ref 22–32)
Calcium: 8.2 mg/dL — ABNORMAL LOW (ref 8.9–10.3)
Chloride: 109 mmol/L (ref 98–111)
Creatinine, Ser: 0.98 mg/dL (ref 0.61–1.24)
GFR, Estimated: >60 mL/min (ref >60)
Glucose, Bld: 98 mg/dL (ref 70–99)
Potassium: 3.5 mmol/L (ref 3.5–5.1)
Sodium: 141 mmol/L (ref 135–145)

## 2020-12-22 LAB — CBC
HCT: 41.9 % (ref 39.0–52.0)
Hemoglobin: 14.5 g/dL (ref 13.0–17.0)
MCH: 29.7 pg (ref 26.0–34.0)
MCHC: 34.6 g/dL (ref 30.0–36.0)
MCV: 85.9 fL (ref 80.0–100.0)
Platelets: 230 10*3/uL (ref 150–400)
RBC: 4.88 MIL/uL (ref 4.22–5.81)
RDW: 12.8 % (ref 11.5–15.5)
WBC: 7.2 10*3/uL (ref 4.0–10.5)
nRBC: 0 % (ref 0.0–0.2)

## 2020-12-22 LAB — URINALYSIS, COMPLETE (UACMP) WITH MICROSCOPIC
Bacteria, UA: NONE SEEN
Bilirubin Urine: NEGATIVE
Glucose, UA: NEGATIVE mg/dL
Hgb urine dipstick: NEGATIVE
Ketones, ur: NEGATIVE mg/dL
Leukocytes,Ua: NEGATIVE
Nitrite: NEGATIVE
Protein, ur: NEGATIVE mg/dL
Specific Gravity, Urine: 1.015 (ref 1.005–1.030)
pH: 6 (ref 5.0–8.0)

## 2020-12-22 MED ORDER — SODIUM CHLORIDE 0.9 % IV BOLUS
1000.0000 mL | Freq: Once | INTRAVENOUS | Status: AC
  Administered 2020-12-22: 1000 mL via INTRAVENOUS

## 2020-12-22 NOTE — ED Provider Notes (Signed)
[facility] Emergency Department Provider Note ____________________________________________   Event Date/Time   First MD Initiated Contact with Patient 12/22/20 0720     (approximate)  I have reviewed the triage vital signs and the nursing notes.  HISTORY  Chief Complaint Dizziness and Tinnitus  HPI Corey [name] is a 35 y.o. male with a history of symptomatic bradycardia, schizoaffective disorder, pacemaker insertion  On patient reports last 2 or 3 days he is felt lightheaded whenever he stands up.  He has not passed out.  When he stands he feels like he will start feeling of tunnel vision, feel lightheaded and fatigued.  Goes away as soon as he lays down or sits down .  No headaches no recent illness.  Currently only taking 2 medications reports he only takes Wellbutrin and Abilify and has been on stable doses for about 4 to 6 months.  He no longer takes additional psychiatric medications he was taken off those in the distant past  Denies take any cardiac medicines.  No abdominal pain.  No nausea or vomiting.  Normal eating and drinking.  No chest pain no shortness of breath.  Feels well when resting, but if he gets up to walk will start to feel lightheaded and has also checked his blood pressure at home and noticed that when he stands up his blood pressure will register a "20 point drop" with standing.  Patient advises he has not seen cardiology in quite some time and not had his pacemaker interrogated recently  Past Medical History:  Diagnosis Date  . Bipolar 1 disorder (HCC)   . GERD (gastroesophageal reflux disease)   . HTN (hypertension)   . Schizoaffective disorder (HCC)   . 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 Overdose of medication, intentional self-harm, initial encounter (HCC) 10/09/2018  . Bipolar 1 disorder, depressed, severe (HCC) 10/09/2018   . GERD (gastroesophageal reflux disease) 08/15/2018  . 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 Cannabis use disorder, moderate, dependence (HCC) 08/14/2018  . Psychosis (HCC) 06/29/2018  . Bipolar I disorder, current or most recent episode depressed, with psychotic features (HCC) 05/04/2018  . Syncope and collapse 04/29/2018  . Hypotension 04/29/2018  . Bradycardia 04/24/2018  . 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 Kidney stone 03/26/2018    Past Surgical History:  Procedure Laterality Date  . ESOPHAGOGASTRODUODENOSCOPY (EGD) WITH PROPOFOL N/A 04/26/2018   Procedure: ESOPHAGOGASTRODUODENOSCOPY (EGD) WITH PROPOFOL;  Surgeon: Wyline Mood, MD;  Location: [facility] ENDOSCOPY;  Service: Gastroenterology;  Laterality: N/A;  . KNEE ARTHROSCOPY    . PACEMAKER INSERTION Left 05/15/2018   Procedure: INSERTION PACEMAKER;  Surgeon: Marcina Millard, MD;  Location: [facility] ORS;  Service: Cardiovascular;  Laterality: Left;    Prior to Admission medications   Medication Sig Start Date End Date Taking? Authorizing Provider  Brexpiprazole (REXULTI) 3 MG TABS Take 3 mg by mouth at bedtime.    [provider]  clonazePAM (KLONOPIN) 1 MG tablet Take 1 mg by mouth 2 (two) times daily as needed for anxiety. 09/05/18   [provider]  colchicine 0.6 MG tablet Take 1 tablet (0.6 mg total) by mouth daily. 09/30/20 09/30/21  Joni Reining, PA-C  colchicine 0.6 MG tablet Take 1 tablet (0.6 mg total) by mouth 2 (two) times daily. 11/23/20 11/23/21  Joni Reining, PA-C  dicyclomine (BENTYL) 10 MG capsule Take 1 capsule (10 mg total) by mouth  3 (three) times daily as needed for up to 5 days (abdominal cramping, diarrhea). 01/25/20 01/30/20  Dionne Bucy, MD  divalproex (DEPAKOTE ER) 500 MG 24 hr tablet Take 2 tablets (1,000 mg total) by mouth at bedtime. 06/16/20   [name], [name], MD  Doxepin HCl 6 MG TABS Take 6 mg by mouth Nightly. 10/04/18   [provider]  Eszopiclone (ESZOPICLONE) 3  MG TABS Take 3 mg by mouth at bedtime. Take immediately before bedtime    [provider]  FLUoxetine (PROZAC) 20 MG capsule Take 1 capsule (20 mg total) by mouth daily. 06/16/20   [name], [name], MD  hydrOXYzine (VISTARIL) 25 MG capsule Take 25-50 mg by mouth 4 (four) times daily as needed.    [provider]  indomethacin (INDOCIN) 50 MG capsule Take 1 capsule (50 mg total) by mouth 3 (three) times daily with meals. 09/30/20   Joni Reining, PA-C  indomethacin (INDOCIN) 50 MG capsule Take 1 capsule (50 mg total) by mouth 3 (three) times daily with meals. 11/23/20   Joni Reining, PA-C  nitroGLYCERIN (NITROSTAT) 0.4 MG SL tablet Place 1 tablet (0.4 mg total) under the tongue every 5 (five) minutes as needed for chest pain. 08/16/18 08/16/19  [name], Ellin Goodie, MD  ondansetron (ZOFRAN ODT) 8 MG disintegrating tablet Take 1 tablet (8 mg total) by mouth every 8 (eight) hours as needed for nausea or vomiting. 01/25/20   Dionne Bucy, MD  pantoprazole (PROTONIX) 40 MG tablet Take 1 tablet (40 mg total) by mouth daily. 08/03/20 10/02/20  Minna Antis, MD  QUEtiapine (SEROQUEL) 100 MG tablet Take 100 mg by mouth Nightly. 10/03/18   [provider]  risperiDONE (RISPERDAL) 2 MG tablet Take 2 tablets (4 mg total) by mouth at bedtime. 06/16/20   [name], [name], MD  Medication list above has changed, only taking Abilify and Wellbutrin  Allergies Penicillins and Sulfa antibiotics  Family History  Problem Relation Age of Onset  . Kidney cancer Neg Hx   . Bladder Cancer Neg Hx   . Prostate cancer Neg Hx     Social History Social History   Tobacco Use  . Smoking status: Former Games developer  . Smokeless tobacco: Current User    Types: Snuff  Vaping Use  . Vaping Use: Every day  Substance Use Topics  . Alcohol use: No  . Drug use: Not Currently    Types: Marijuana    Review of Systems Constitutional: No fever/chills Eyes: No visual changes except he will develop  a "tunnel vision" when standing. ENT: No sore throat. Cardiovascular: Denies chest pain. Respiratory: Denies shortness of breath. Gastrointestinal: No abdominal pain.   Genitourinary: Negative for dysuria. Musculoskeletal: No muscle aches or pains. Skin: Negative for rash. Neurological: Negative for headaches, areas of focal weakness or numbness.    ____________________________________________   PHYSICAL EXAM:  VITAL SIGNS: ED Triage Vitals  Enc Vitals Group     BP 12/22/20 0650 (!) 136/95     Pulse Rate 12/22/20 0650 64     Resp 12/22/20 0650 20     Temp 12/22/20 0650 97.9 F (36.6 C)     Temp Source 12/22/20 0650 Oral     SpO2 12/22/20 0650 94 %     Weight 12/22/20 0649 251 lb (113.9 kg)     Height 12/22/20 0649 5\' 11"  (1.803 m)     Head Circumference --      Peak Flow --      Pain Score  12/22/20 0649 0     Pain Loc --      Pain Edu? --      Excl. in GC? --     Constitutional: Alert and oriented. Well appearing and in no acute distress.  He is very pleasant, kind and in no distress. Eyes: Conjunctivae are normal. Head: Atraumatic. Nose: No congestion/rhinnorhea. Mouth/Throat: Mucous membranes are moist. Neck: No stridor.  Cardiovascular: Normal rate, regular rhythm. Grossly normal heart sounds.  Good peripheral circulation. Respiratory: Normal respiratory effort.  No retractions. Lungs CTAB. Gastrointestinal: Soft and nontender. No distention. Musculoskeletal: No lower extremity tenderness nor edema. Neurologic:  Normal speech and language. No gross focal neurologic deficits are appreciated.  Skin:  Skin is warm, dry and intact. No rash noted. Psychiatric: Mood and affect are normal. Speech and behavior are normal.  ____________________________________________   LABS (all labs ordered are listed, but only abnormal results are displayed)  Labs Reviewed  BASIC METABOLIC PANEL - Abnormal; Notable for the following components:      Result Value   Calcium 8.2  (*)    All other components within normal limits  URINALYSIS, COMPLETE (UACMP) WITH MICROSCOPIC - Abnormal; Notable for the following components:   Color, Urine YELLOW (*)    APPearance CLEAR (*)    All other components within normal limits  CBC   ____________________________________________  EKG  Reviewed inter by me at 7 AM Heart rate 69 QRS 1059 QTc 430 Slight baseline wander noted in V2, no evidence of acute ischemia.  No acute abnormalities.  No prolonged QT  ____________________________________________   PROCEDURES  Procedure(s) performed: None  Procedures  Critical Care performed: No  ____________________________________________   INITIAL IMPRESSION / ASSESSMENT AND PLAN / ED COURSE  Pertinent labs & imaging results that were available during my care of the patient were reviewed by me and considered in my medical decision making (see chart for details).   Reassuring clinical exam.  No acute neurologic symptoms.  No chest pain.  No acute pulmonary or vascular symptoms.  Normal vital signs.  EKG here demonstrates sinus rhythm.  We will interrogate his pacemaker.  Additionally check labs check for anemia, he does not appear obviously dehydrated, does not take medications that have obvious or major side effects I suspect that would cause orthostatic hypotension.  Will check orthostatics.  Overall very reassuring clinical exam, fully awake and alert, no syncope.  I am sent direct message to Dr. Cassie Freer requesting clinic call and schedule close follow-up with the patient, this was acknowledged by B Gwen Pounds (will setup close follow-up if discharged)   ----------------------------------------- 11:10 AM on 12/22/2020 -----------------------------------------  Pacemaker interrogated, no recent events but has had several episodes of SVT in the past.  Discussed and Dr. Gwen Pounds will have clinic arrange close follow-up for patient.  Discussed with the patient is resting  comfortably now, reports he feels well without distress or complaint.  He is comfortable with plan to be discharged and follow-up closely with cardiology.  In the interim I recommended that he take 2 days off from work, and be very cautious need for working with anything dangerous, driving, or being around any heavy or potentially dangerous etc.  Return precautions and treatment recommendations and follow-up discussed with the patient who is agreeable with the plan.      ____________________________________________   FINAL CLINICAL IMPRESSION(S) / ED DIAGNOSES  Final diagnoses:  Orthostatic hypotension  Pacemaker        Note:  This document was prepared using Dragon  voice recognition software and may include unintentional dictation errors       Sharyn CreamerQuale, Wake Conlee, MD 12/22/20 1111

## 2020-12-22 NOTE — ED Notes (Signed)
[employer] called with pacemaker report will fax over results of : Last check at 04/2019 101 events since then of SVT Last documented event on 09/2020.

## 2020-12-22 NOTE — ED Notes (Signed)
ED Triage Note:  Pt arrived via POV with reports of dizziness and tinnitus x 2 days, pt states he also feels lightheaded all day, pt states he has a [employer] pacemaker x 2 years states he feels like his heart skips a beat sometimes.   Denies pain at this time.  Pt sees Dr. Cassie Freer

## 2020-12-25 ENCOUNTER — Emergency Department: Payer: Commercial Managed Care - HMO

## 2020-12-25 ENCOUNTER — Emergency Department
Admission: EM | Admit: 2020-12-25 | Discharge: 2020-12-25 | Disposition: A | Discharge status: Home / Self Care | Payer: Commercial Managed Care - HMO | Attending: Emergency Medicine | Admitting: Emergency Medicine

## 2020-12-25 ENCOUNTER — Other Ambulatory Visit: Payer: Self-pay

## 2020-12-25 DIAGNOSIS — R42 Dizziness and giddiness: Secondary | ICD-10-CM | POA: Insufficient documentation

## 2020-12-25 DIAGNOSIS — R079 Chest pain, unspecified: Secondary | ICD-10-CM | POA: Diagnosis present

## 2020-12-25 DIAGNOSIS — R0789 Other chest pain: Secondary | ICD-10-CM | POA: Diagnosis not present

## 2020-12-25 DIAGNOSIS — Z950 Presence of cardiac pacemaker: Secondary | ICD-10-CM | POA: Diagnosis not present

## 2020-12-25 DIAGNOSIS — Z87891 Personal history of nicotine dependence: Secondary | ICD-10-CM | POA: Insufficient documentation

## 2020-12-25 DIAGNOSIS — I10 Essential (primary) hypertension: Secondary | ICD-10-CM | POA: Diagnosis not present

## 2020-12-25 LAB — BASIC METABOLIC PANEL
Anion gap: 9 (ref 5–15)
BUN: 13 mg/dL (ref 6–20)
CO2: 23 mmol/L (ref 22–32)
Calcium: 8.8 mg/dL — ABNORMAL LOW (ref 8.9–10.3)
Chloride: 107 mmol/L (ref 98–111)
Creatinine, Ser: 0.87 mg/dL (ref 0.61–1.24)
GFR, Estimated: >60 mL/min (ref >60)
Glucose, Bld: 94 mg/dL (ref 70–99)
Potassium: 3.9 mmol/L (ref 3.5–5.1)
Sodium: 139 mmol/L (ref 135–145)

## 2020-12-25 LAB — CBC
HCT: 47 % (ref 39.0–52.0)
Hemoglobin: 16.2 g/dL (ref 13.0–17.0)
MCH: 29.2 pg (ref 26.0–34.0)
MCHC: 34.5 g/dL (ref 30.0–36.0)
MCV: 84.8 fL (ref 80.0–100.0)
Platelets: 285 10*3/uL (ref 150–400)
RBC: 5.54 MIL/uL (ref 4.22–5.81)
RDW: 12.9 % (ref 11.5–15.5)
WBC: 9.4 10*3/uL (ref 4.0–10.5)
nRBC: 0 % (ref 0.0–0.2)

## 2020-12-25 LAB — TROPONIN I (HIGH SENSITIVITY): Troponin I (High Sensitivity): <2 ng/L (ref <18)

## 2020-12-25 NOTE — ED Provider Notes (Signed)
[facility] Emergency Department Provider Note   ____________________________________________   Event Date/Time   First MD Initiated Contact with Patient 12/25/20 1701     (approximate)  I have reviewed the triage vital signs and the nursing notes.   HISTORY  Chief Complaint Chest Pain    HPI Corey [name] is a 35 y.o. male with the below stated past medical history presents for chest pain, fluctuating blood pressure, and orthostatic lightheadedness.  Patient states that he has a pacemaker that was placed for sick sinus syndrome and was recently seen for similar symptoms approximately 3 days prior to arrival.  Patient states that the symptoms resolved somewhat however came back the last 24 hours.  Patient describes a sharp/stabbing pain to the left lateral inferior portion of the mediastinum.  This pain lasts approximately 1 minute and resolved spontaneously without any exacerbating or relieving factors.  Patient currently denies any vision changes, tinnitus, difficulty speaking, facial droop, sore throat, shortness of breath, abdominal pain, nausea/vomiting/diarrhea, dysuria, or weakness/numbness/paresthesias in any extremity         Past Medical History:  Diagnosis Date  . Bipolar 1 disorder (HCC)   . GERD (gastroesophageal reflux disease)   . HTN (hypertension)   . Schizoaffective disorder (HCC)   . 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 Overdose of medication, intentional self-harm, initial encounter (HCC) 10/09/2018  . Bipolar 1 disorder, depressed, severe (HCC) 10/09/2018  . GERD (gastroesophageal reflux disease) 08/15/2018  . 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 Cannabis use disorder, moderate, dependence (HCC) 08/14/2018  . Psychosis (HCC) 06/29/2018  . Bipolar I disorder, current or most recent episode depressed, with psychotic features (HCC)  05/04/2018  . Syncope and collapse 04/29/2018  . Hypotension 04/29/2018  . Bradycardia 04/24/2018  . 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 Kidney stone 03/26/2018    Past Surgical History:  Procedure Laterality Date  . ESOPHAGOGASTRODUODENOSCOPY (EGD) WITH PROPOFOL N/A 04/26/2018   Procedure: ESOPHAGOGASTRODUODENOSCOPY (EGD) WITH PROPOFOL;  Surgeon: Wyline Mood, MD;  Location: [facility] ENDOSCOPY;  Service: Gastroenterology;  Laterality: N/A;  . KNEE ARTHROSCOPY    . PACEMAKER INSERTION Left 05/15/2018   Procedure: INSERTION PACEMAKER;  Surgeon: Marcina Millard, MD;  Location: [facility] ORS;  Service: Cardiovascular;  Laterality: Left;    Prior to Admission medications   Medication Sig Start Date End Date Taking? Authorizing Provider  Brexpiprazole (REXULTI) 3 MG TABS Take 3 mg by mouth at bedtime.    [provider]  clonazePAM (KLONOPIN) 1 MG tablet Take 1 mg by mouth 2 (two) times daily as needed for anxiety. 09/05/18   [provider]  colchicine 0.6 MG tablet Take 1 tablet (0.6 mg total) by mouth daily. 09/30/20 09/30/21  Joni Reining, PA-C  colchicine 0.6 MG tablet Take 1 tablet (0.6 mg total) by mouth 2 (two) times daily. 11/23/20 11/23/21  Joni Reining, PA-C  dicyclomine (BENTYL) 10 MG capsule Take 1 capsule (10 mg total) by mouth 3 (three) times daily as needed for up to 5 days (abdominal cramping, diarrhea). 01/25/20 01/30/20  Dionne Bucy, MD  divalproex (DEPAKOTE ER) 500 MG 24 hr tablet Take 2 tablets (1,000 mg total) by mouth at bedtime. 06/16/20   [name], [name], MD  Doxepin HCl 6 MG TABS Take 6 mg by mouth Nightly. 10/04/18   [provider]  Eszopiclone (ESZOPICLONE) 3 MG TABS Take 3 mg by mouth  at bedtime. Take immediately before bedtime    [provider]  FLUoxetine (PROZAC) 20 MG capsule Take 1 capsule (20 mg total) by mouth daily. 06/16/20   [name], [name], MD  hydrOXYzine (VISTARIL) 25 MG capsule Take 25-50 mg by mouth 4 (four) times  daily as needed.    [provider]  indomethacin (INDOCIN) 50 MG capsule Take 1 capsule (50 mg total) by mouth 3 (three) times daily with meals. 09/30/20   Joni Reining, PA-C  indomethacin (INDOCIN) 50 MG capsule Take 1 capsule (50 mg total) by mouth 3 (three) times daily with meals. 11/23/20   Joni Reining, PA-C  nitroGLYCERIN (NITROSTAT) 0.4 MG SL tablet Place 1 tablet (0.4 mg total) under the tongue every 5 (five) minutes as needed for chest pain. 08/16/18 08/16/19  [name], Ellin Goodie, MD  ondansetron (ZOFRAN ODT) 8 MG disintegrating tablet Take 1 tablet (8 mg total) by mouth every 8 (eight) hours as needed for nausea or vomiting. 01/25/20   Dionne Bucy, MD  pantoprazole (PROTONIX) 40 MG tablet Take 1 tablet (40 mg total) by mouth daily. 08/03/20 10/02/20  Minna Antis, MD  QUEtiapine (SEROQUEL) 100 MG tablet Take 100 mg by mouth Nightly. 10/03/18   [provider]  risperiDONE (RISPERDAL) 2 MG tablet Take 2 tablets (4 mg total) by mouth at bedtime. 06/16/20   [name], [name], MD    Allergies Penicillins and Sulfa antibiotics  Family History  Problem Relation Age of Onset  . Kidney cancer Neg Hx   . Bladder Cancer Neg Hx   . Prostate cancer Neg Hx     Social History Social History   Tobacco Use  . Smoking status: Former Games developer  . Smokeless tobacco: Current User    Types: Snuff  Vaping Use  . Vaping Use: Every day  Substance Use Topics  . Alcohol use: No  . Drug use: Not Currently    Types: Marijuana    Review of Systems Constitutional: No fever/chills Eyes: No visual changes. ENT: No sore throat. Cardiovascular: Endorses chest pain and orthostatic lightheadedness Respiratory: Denies shortness of breath. Gastrointestinal: No abdominal pain.  No nausea, no vomiting.  No diarrhea. Genitourinary: Negative for dysuria. Musculoskeletal: Negative for acute arthralgias Skin: Negative for rash. Neurological: Negative for headaches,  weakness/numbness/paresthesias in any extremity Psychiatric: Negative for suicidal ideation/homicidal ideation   ____________________________________________   PHYSICAL EXAM:  VITAL SIGNS: ED Triage Vitals  Enc Vitals Group     BP 12/25/20 1456 (!) 131/93     Pulse Rate 12/25/20 1456 79     Resp 12/25/20 1456 18     Temp 12/25/20 1456 98.2 F (36.8 C)     Temp src --      SpO2 12/25/20 1456 97 %     Weight 12/25/20 1454 251 lb (113.9 kg)     Height 12/25/20 1454 5\' 11"  (1.803 m)     Head Circumference --      Peak Flow --      Pain Score 12/25/20 1454 0     Pain Loc --      Pain Edu? --      Excl. in GC? --    Constitutional: Alert and oriented. Well appearing middle-aged Caucasian male in no acute distress. Eyes: Conjunctivae are normal. PERRL. Head: Atraumatic. Nose: No congestion/rhinnorhea. Mouth/Throat: Mucous membranes are moist. Neck: No stridor Cardiovascular: Grossly normal heart sounds.  Good peripheral circulation. Respiratory: Normal respiratory effort.  No retractions. Gastrointestinal: Soft and nontender. No distention.  Musculoskeletal: No obvious deformities Neurologic:  Normal speech and language. No gross focal neurologic deficits are appreciated. Skin:  Skin is warm and dry. No rash noted. Psychiatric: Mood and affect are normal. Speech and behavior are normal.  ____________________________________________   LABS (all labs ordered are listed, but only abnormal results are displayed)  Labs Reviewed  BASIC METABOLIC PANEL - Abnormal; Notable for the following components:      Result Value   Calcium 8.8 (*)    All other components within normal limits  CBC  TROPONIN I (HIGH SENSITIVITY)  TROPONIN I (HIGH SENSITIVITY)   ____________________________________________  EKG  ED ECG REPORT I, Merwyn Katos, the attending physician, personally viewed and interpreted this ECG.  Date: 12/25/2020 EKG Time: 1458 Rate: 66 Rhythm: normal sinus  rhythm QRS Axis: normal Intervals: normal ST/T Wave abnormalities: normal Narrative Interpretation: no evidence of acute ischemia  ____________________________________________  RADIOLOGY  ED MD interpretation: 2 view chest x-ray shows no evidence of acute abnormalities including no pneumonia, pneumothorax, or widened mediastinum  Official radiology report(s): DG Chest 2 View  Result Date: 12/25/2020 CLINICAL DATA:  Chest pain EXAM: CHEST - 2 VIEW COMPARISON:  10/09/2018 FINDINGS: Left-sided pacing device with right atrial and right ventricular leads, slightly looped appearance of the ventricular lead. No focal opacity or pleural effusion. Normal cardiomediastinal silhouette. No pneumothorax IMPRESSION: No active cardiopulmonary disease. Electronically Signed   By: Jasmine Pang M.D.   On: 12/25/2020 15:34    ____________________________________________   PROCEDURES  Procedure(s) performed (including Critical Care):  .1-3 Lead EKG Interpretation Performed by: Merwyn Katos, MD Authorized by: Merwyn Katos, MD     Interpretation: normal     ECG rate:  78   ECG rate assessment: normal     Rhythm: sinus rhythm     Ectopy: none     Conduction: normal       ____________________________________________   INITIAL IMPRESSION / ASSESSMENT AND PLAN / ED COURSE  As part of my medical decision making, I reviewed the following data within the electronic MEDICAL RECORD NUMBER Nursing notes reviewed and incorporated, Labs reviewed, EKG interpreted, Old chart reviewed, Radiograph reviewed and Notes from prior ED visits reviewed and incorporated        Workup: ECG, CXR, CBC, BMP, Troponin Findings: ECG: No overt evidence of STEMI. No evidence of Brugadas sign, delta wave, epsilon wave, significantly prolonged QTc, or malignant arrhythmia HS Troponin: Negative x1 Other Labs unremarkable for emergent problems. CXR: Without PTX, PNA, or widened mediastinum Last Stress Test:  Never Last Heart Catheterization: Never HEART Score: 3  Given History, Exam, and Workup I have low suspicion for ACS, Pneumothorax, Pneumonia, Pulmonary Embolus, Tamponade, Aortic Dissection or other emergent problem as a cause for this presentation.   Reassesment: Prior to discharge patients pain was controlled and they were well appearing.  Disposition:  Discharge. Strict return precautions discussed with patient with full understanding. Advised patient to follow up promptly with primary care provider       ____________________________________________   FINAL CLINICAL IMPRESSION(S) / ED DIAGNOSES  Final diagnoses:  Atypical chest pain  Orthostatic lightheadedness     ED Discharge Orders    None       Note:  This document was prepared using Dragon voice recognition software and may include unintentional dictation errors.   Merwyn Katos, MD 12/25/20 [phone]

## 2020-12-25 NOTE — ED Triage Notes (Signed)
Pt to ED POV for chest pain, fluctuating bp, dizziness. States has pacemaker, was seen recently for same and told to come back if worsened.  Hx sick sinus syndrome

## 2020-12-25 NOTE — ED Notes (Signed)
Pt to ED see triage note. Pt has pacemaker since 2 years. Hx sick sinus syndrome. States that since 2d pt has had SOB, tunnelled vision, stabbing chest pain that comes and goes. Denies chest pressure or heaviness.  EDP has seen pt already. Pt in NAD. Does not appear SOB. SPO2 98% on RA.

## 2021-02-11 ENCOUNTER — Other Ambulatory Visit: Payer: Self-pay

## 2021-02-11 ENCOUNTER — Encounter: Payer: Self-pay | Admitting: Emergency Medicine

## 2021-02-11 ENCOUNTER — Emergency Department: Payer: Commercial Managed Care - HMO

## 2021-02-11 ENCOUNTER — Emergency Department
Admission: EM | Admit: 2021-02-11 | Discharge: 2021-02-11 | Disposition: A | Discharge status: Home / Self Care | Payer: Commercial Managed Care - HMO | Attending: Emergency Medicine | Admitting: Emergency Medicine

## 2021-02-11 DIAGNOSIS — Z79899 Other long term (current) drug therapy: Secondary | ICD-10-CM | POA: Diagnosis not present

## 2021-02-11 DIAGNOSIS — I10 Essential (primary) hypertension: Secondary | ICD-10-CM | POA: Diagnosis not present

## 2021-02-11 DIAGNOSIS — M10071 Idiopathic gout, right ankle and foot: Secondary | ICD-10-CM | POA: Diagnosis not present

## 2021-02-11 DIAGNOSIS — Z87891 Personal history of nicotine dependence: Secondary | ICD-10-CM | POA: Diagnosis not present

## 2021-02-11 DIAGNOSIS — Z7982 Long term (current) use of aspirin: Secondary | ICD-10-CM | POA: Insufficient documentation

## 2021-02-11 DIAGNOSIS — R2241 Localized swelling, mass and lump, right lower limb: Secondary | ICD-10-CM | POA: Diagnosis present

## 2021-02-11 DIAGNOSIS — Z950 Presence of cardiac pacemaker: Secondary | ICD-10-CM | POA: Diagnosis not present

## 2021-02-11 MED ORDER — COLCHICINE 0.6 MG PO TABS
1.2000 mg | ORAL_TABLET | Freq: Once | ORAL | Status: AC
  Administered 2021-02-11: 1.2 mg via ORAL
  Filled 2021-02-11: qty 2

## 2021-02-11 MED ORDER — COLCHICINE 0.6 MG PO TABS: ORAL_TABLET | ORAL | 0 refills | Status: DC

## 2021-02-11 MED ORDER — NAPROXEN 500 MG PO TABS
500.0000 mg | ORAL_TABLET | Freq: Once | ORAL | Status: AC
  Administered 2021-02-11: 500 mg via ORAL
  Filled 2021-02-11: qty 1

## 2021-02-11 NOTE — ED Provider Notes (Signed)
[facility] Emergency Department Provider Note  ____________________________________________   Event Date/Time   First MD Initiated Contact with Patient 02/11/21 1326     (approximate)  I have reviewed the triage vital signs and the nursing notes.   HISTORY  Chief Complaint Ankle Pain  HPI Corey [name] is a 35 y.o. male who presents to the emergency department for evaluation of right foot swelling and pain that began suddenly yesterday without any known injury.  He states that prior to this it was aching a little bit but swelled up last night.  He states history of gout treated a few times in the last couple of months that were also in the same foot and ankle.  He denies any open wounds, fevers, chills or other systemic symptoms.         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 Darrold Junker    Patient Active Problem List   Diagnosis Date Noted   Overdose of medication, intentional self-harm, initial encounter (HCC) 10/09/2018   Bipolar 1 disorder, depressed, severe (HCC) 10/09/2018   GERD (gastroesophageal reflux disease) 08/15/2018   Cardiac pacemaker in situ 08/15/2018   Cannabis use disorder, moderate, dependence (HCC) 08/14/2018   Psychosis (HCC) 06/29/2018   Bipolar I disorder, current or most recent episode depressed, with psychotic features (HCC) 05/04/2018   Syncope and collapse 04/29/2018   Hypotension 04/29/2018   Bradycardia 04/24/2018   Symptomatic bradycardia 04/24/2018   Kidney stone 03/26/2018    Past Surgical History:  Procedure Laterality Date   ESOPHAGOGASTRODUODENOSCOPY (EGD) WITH PROPOFOL N/A 04/26/2018   Procedure: ESOPHAGOGASTRODUODENOSCOPY (EGD) WITH PROPOFOL;  Surgeon: Wyline Mood, MD;  Location: [facility] ENDOSCOPY;  Service:  Gastroenterology;  Laterality: N/A;   KNEE ARTHROSCOPY     PACEMAKER INSERTION Left 05/15/2018   Procedure: INSERTION PACEMAKER;  Surgeon: Marcina Millard, MD;  Location: [facility] ORS;  Service: Cardiovascular;  Laterality: Left;    Prior to Admission medications   Medication Sig Start Date End Date Taking? Authorizing Provider  colchicine 0.6 MG tablet Take 1 tablet by mouth once daily until resolution of gout flare. 02/11/21  Yes Shanel Prazak, Ruben Gottron, PA  Brexpiprazole (REXULTI) 3 MG TABS Take 3 mg by mouth at bedtime.    [provider]  clonazePAM (KLONOPIN) 1 MG tablet Take 1 mg by mouth 2 (two) times daily as needed for anxiety. 09/05/18   [provider]  dicyclomine (BENTYL) 10 MG capsule Take 1 capsule (10 mg total) by mouth 3 (three) times daily as needed for up to 5 days (abdominal cramping, diarrhea). 01/25/20 01/30/20  Dionne Bucy, MD  divalproex (DEPAKOTE ER) 500 MG 24 hr tablet Take 2 tablets (1,000 mg total) by mouth at bedtime. 06/16/20   [name], [name], MD  Doxepin HCl 6 MG TABS Take 6 mg by mouth Nightly. 10/04/18   [provider]  Eszopiclone (ESZOPICLONE) 3 MG TABS Take 3 mg by mouth at bedtime. Take immediately before bedtime    [provider]  FLUoxetine (PROZAC) 20 MG capsule Take 1 capsule (20 mg total) by mouth daily. 06/16/20   [name], [name], MD  hydrOXYzine (VISTARIL) 25 MG capsule Take 25-50 mg by mouth 4 (four) times daily as needed.    [provider]  indomethacin (INDOCIN) 50 MG capsule Take  1 capsule (50 mg total) by mouth 3 (three) times daily with meals. 09/30/20   Joni Reining, PA-C  indomethacin (INDOCIN) 50 MG capsule Take 1 capsule (50 mg total) by mouth 3 (three) times daily with meals. 11/23/20   Joni Reining, PA-C  nitroGLYCERIN (NITROSTAT) 0.4 MG SL tablet Place 1 tablet (0.4 mg total) under the tongue every 5 (five) minutes as needed for chest pain. 08/16/18 08/16/19  [name], Ellin Goodie, MD   ondansetron (ZOFRAN ODT) 8 MG disintegrating tablet Take 1 tablet (8 mg total) by mouth every 8 (eight) hours as needed for nausea or vomiting. 01/25/20   Dionne Bucy, MD  pantoprazole (PROTONIX) 40 MG tablet Take 1 tablet (40 mg total) by mouth daily. 08/03/20 10/02/20  Minna Antis, MD  QUEtiapine (SEROQUEL) 100 MG tablet Take 100 mg by mouth Nightly. 10/03/18   [provider]  risperiDONE (RISPERDAL) 2 MG tablet Take 2 tablets (4 mg total) by mouth at bedtime. 06/16/20   [name], [name], MD    Allergies Penicillins and Sulfa antibiotics  Family History  Problem Relation Age of Onset   Kidney cancer Neg Hx    Bladder Cancer Neg Hx    Prostate cancer Neg Hx     Social History Social History   Tobacco Use   Smoking status: Former    Pack years: 0.00   Smokeless tobacco: Current    Types: Snuff  Vaping Use   Vaping Use: Every day  Substance Use Topics   Alcohol use: No   Drug use: Not Currently    Types: Marijuana    Review of Systems Constitutional: No fever/chills Eyes: No visual changes. ENT: No sore throat. Cardiovascular: Denies chest pain. Respiratory: Denies shortness of breath. Gastrointestinal: No abdominal pain.  No nausea, no vomiting.  No diarrhea.  No constipation. Genitourinary: Negative for dysuria. Musculoskeletal: + Right foot pain Skin: Negative for rash. Neurological: Negative for headaches, focal weakness or numbness.  ____________________________________________   PHYSICAL EXAM:  VITAL SIGNS: ED Triage Vitals  Enc Vitals Group     BP 02/11/21 1138 97/65     Pulse Rate 02/11/21 1138 76     Resp 02/11/21 1138 18     Temp 02/11/21 1138 98.7 F (37.1 C)     Temp src --      SpO2 02/11/21 1138 100 %     Weight 02/11/21 1135 251 lb 1.7 oz (113.9 kg)     Height 02/11/21 1135 5\' 11"  (1.803 m)     Head Circumference --      Peak Flow --      Pain Score 02/11/21 1135 7     Pain Loc --      Pain Edu? --      Excl. in GC? --     Constitutional: Alert and oriented. Well appearing and in no acute distress. Eyes: Conjunctivae are normal. PERRL. EOMI. Head: Atraumatic. Nose: No congestion/rhinnorhea. Mouth/Throat: Mucous membranes are moist.  Neck: No stridor.   Cardiovascular: Normal rate, regular rhythm. Grossly normal heart sounds.  Good peripheral circulation. Respiratory: Normal respiratory effort.  No retractions. Lungs CTAB. Gastrointestinal: Soft and nontender. No distention. No abdominal bruits. No CVA tenderness. Musculoskeletal: There is mild swelling noted to the right foot and ankle with diffuse tenderness.  There is some very mild erythema at the top dorsum of the foot.  Dorsal pedal pulses 2+ bilaterally, capillary refill less than 3 seconds.  Patient able to freely move the digits, but has increased  pain with range of motion of the ankle. Neurologic:  Normal speech and language. No gross focal neurologic deficits are appreciated.  Skin:  Skin is warm, dry and intact. No rash noted. Psychiatric: Mood and affect are normal. Speech and behavior are normal.  ____________________________________________  RADIOLOGY I, Lucy Chris, personally viewed and evaluated these images (plain radiographs) as part of my medical decision making, as well as reviewing the written report by the radiologist.  ED provider interpretation: No acute abnormality identified of the right ankle  Official radiology report(s): DG Ankle Complete Right  Result Date: 02/11/2021 CLINICAL DATA:  Pain and swelling RIGHT ankle EXAM: RIGHT ANKLE - COMPLETE 3+ VIEW COMPARISON:  None. FINDINGS: Ankle mortise intact. The talar dome is normal. No malleolar fracture. The calcaneus is normal. IMPRESSION: No fracture or dislocation. Electronically Signed   By: Genevive Bi M.D.   On: 02/11/2021 12:29      ____________________________________________   INITIAL IMPRESSION / ASSESSMENT AND PLAN / ED COURSE  As part of my medical  decision making, I reviewed the following data within the electronic MEDICAL RECORD NUMBER Nursing notes reviewed and incorporated, Radiograph reviewed, and Notes from prior ED visits        Patient is a 35 year old male who presents to the emergency department for evaluation of right foot and ankle pain see HPI for further details.  In triage patient has grossly normal vital signs.  On physical exam patient does have some swelling and mild erythema to the right foot that is diffusely tender without any known trauma or injury.  Differentials considered include sprain, cellulitis, gout, septic joint.  Low suspicion for septic joint given no recent open wounds, no fevers or other systemic symptoms.  There is no trauma to indicate cause for acute sprain or fracture, and x-rays are negative for acute pathology.  Patient has known history of gout in the same foot treated multiple times in the last few months, feel this is most likely diagnosis.  Will initiate treatment for gout and recommend close PCP follow-up.  Patient was referred to a local PCP for this, and return precautions were discussed at length particularly for you any fever, chills or other systemic symptoms.      ____________________________________________   FINAL CLINICAL IMPRESSION(S) / ED DIAGNOSES  Final diagnoses:  Acute idiopathic gout of right foot     ED Discharge Orders          Ordered    colchicine 0.6 MG tablet        02/11/21 1402             Note:  This document was prepared using Dragon voice recognition software and may include unintentional dictation errors.    Lucy Chris, PA 02/11/21 1542    Arnaldo Natal, MD 02/11/21 [phone]

## 2021-02-11 NOTE — Discharge Instructions (Addendum)
Please use Colchicene once daily until resolution of symptoms. You may also combine Tylenol up to 1000mg  4x daily for acute pain. Ice and elevate the effected area. Please establish with a local primary care. Return if you experience any worsening.

## 2021-02-11 NOTE — ED Triage Notes (Signed)
C/o right ankle pain and swelling.  States ankle has been hurting for several days, but today ankle is swollen and patient states unable to bear weight.

## 2021-02-18 ENCOUNTER — Other Ambulatory Visit: Payer: Self-pay

## 2022-01-04 DIAGNOSIS — G4700 Insomnia, unspecified: Secondary | ICD-10-CM | POA: Diagnosis not present

## 2022-01-04 DIAGNOSIS — F250 Schizoaffective disorder, bipolar type: Secondary | ICD-10-CM | POA: Diagnosis not present

## 2022-01-04 DIAGNOSIS — R69 Illness, unspecified: Secondary | ICD-10-CM | POA: Diagnosis not present

## 2022-02-22 DIAGNOSIS — R69 Illness, unspecified: Secondary | ICD-10-CM | POA: Diagnosis not present

## 2022-02-22 DIAGNOSIS — Z79899 Other long term (current) drug therapy: Secondary | ICD-10-CM | POA: Diagnosis not present

## 2022-03-15 DIAGNOSIS — Z79899 Other long term (current) drug therapy: Secondary | ICD-10-CM | POA: Diagnosis not present

## 2022-04-13 ENCOUNTER — Emergency Department
Admission: EM | Admit: 2022-04-13 | Discharge: 2022-04-13 | Disposition: A | Discharge status: Home / Self Care | Payer: 59 | Attending: Emergency Medicine | Admitting: Emergency Medicine

## 2022-04-13 DIAGNOSIS — I10 Essential (primary) hypertension: Secondary | ICD-10-CM | POA: Insufficient documentation

## 2022-04-13 DIAGNOSIS — G43809 Other migraine, not intractable, without status migrainosus: Secondary | ICD-10-CM | POA: Diagnosis not present

## 2022-04-13 DIAGNOSIS — Z950 Presence of cardiac pacemaker: Secondary | ICD-10-CM | POA: Diagnosis not present

## 2022-04-13 DIAGNOSIS — G43909 Migraine, unspecified, not intractable, without status migrainosus: Secondary | ICD-10-CM | POA: Diagnosis not present

## 2022-04-13 DIAGNOSIS — Z79899 Other long term (current) drug therapy: Secondary | ICD-10-CM | POA: Diagnosis not present

## 2022-04-13 DIAGNOSIS — G43901 Migraine, unspecified, not intractable, with status migrainosus: Secondary | ICD-10-CM | POA: Insufficient documentation

## 2022-04-13 DIAGNOSIS — R110 Nausea: Secondary | ICD-10-CM | POA: Diagnosis not present

## 2022-04-13 DIAGNOSIS — R519 Headache, unspecified: Secondary | ICD-10-CM | POA: Diagnosis not present

## 2022-04-13 MED ORDER — KETOROLAC TROMETHAMINE 30 MG/ML IJ SOLN
15.0000 mg | Freq: Once | INTRAMUSCULAR | Status: AC
  Administered 2022-04-13: 15 mg via INTRAVENOUS
  Filled 2022-04-13: qty 1

## 2022-04-13 MED ORDER — PROCHLORPERAZINE EDISYLATE 10 MG/2ML IJ SOLN
10.0000 mg | Freq: Once | INTRAMUSCULAR | Status: AC
  Administered 2022-04-13: 10 mg via INTRAVENOUS
  Filled 2022-04-13: qty 2

## 2022-04-13 MED ORDER — DIPHENHYDRAMINE HCL 50 MG/ML IJ SOLN
25.0000 mg | Freq: Once | INTRAMUSCULAR | Status: AC
  Administered 2022-04-13: 25 mg via INTRAVENOUS
  Filled 2022-04-13: qty 1

## 2022-04-13 MED ORDER — DEXAMETHASONE SODIUM PHOSPHATE 10 MG/ML IJ SOLN
10.0000 mg | Freq: Once | INTRAMUSCULAR | Status: AC
  Administered 2022-04-13: 10 mg via INTRAVENOUS
  Filled 2022-04-13: qty 1

## 2022-04-13 MED ORDER — LACTATED RINGERS IV BOLUS
1000.0000 mL | Freq: Once | INTRAVENOUS | Status: AC
  Administered 2022-04-13: 1000 mL via INTRAVENOUS

## 2022-04-13 MED ORDER — PROCHLORPERAZINE MALEATE 10 MG PO TABS: 10.0000 mg | ORAL_TABLET | Freq: Four times a day (QID) | ORAL | 0 refills | Status: DC | PRN

## 2022-04-13 NOTE — ED Triage Notes (Signed)
Pt presents to ED from home C/O migraine since Friday. Pt reports being seen at [facility] this AM who gave him IM Benadryl, toradol, and phenergan which gave him relief for about 45 minutes, but it has since returned.

## 2022-04-13 NOTE — ED Provider Notes (Signed)
[facility] Provider Note    Event Date/Time   First MD Initiated Contact with Patient 04/13/22 1840     (approximate)   History   Chief Complaint Migraine   HPI  Corey [name] is a 36 y.o. male with past medical history of hypertension, kidney stones, sick sinus syndrome status post pacemaker, and bipolar disorder who presents to the ED complaining of migraine.  Patient reports that he has been dealing with increasing headache for the past 4 days, which she describes as similar to previous migraines but more severe.  He complains of throbbing pain primarily in the back of his head and moving down both sides of his neck.  Pain has been constant and gradually worsening over the past few days, is exacerbated by bright lights and loud sounds.  He has been feeling nauseous but has not vomited, denies any fevers or neck stiffness.  He has not had any vision changes, speech changes, numbness, or weakness.  He was evaluated at the walk-in clinic earlier today, given IM Toradol, Phenergan, and Benadryl with improvement in symptoms for about 1 hour.  When symptoms returned, he was advised to seek further care in the ED.     Physical Exam   Triage Vital Signs: ED Triage Vitals  Enc Vitals Group     BP 04/13/22 1826 136/84     Pulse Rate 04/13/22 1826 93     Resp 04/13/22 1826 20     Temp 04/13/22 1826 98.7 F (37.1 C)     Temp Source 04/13/22 1826 Oral     SpO2 04/13/22 1826 93 %     Weight 04/13/22 1825 255 lb (115.7 kg)     Height 04/13/22 1825 5\' 11"  (1.803 m)     Head Circumference --      Peak Flow --      Pain Score 04/13/22 1825 8     Pain Loc --      Pain Edu? --      Excl. in GC? --     Most recent vital signs: Vitals:   04/13/22 1826  BP: 136/84  Pulse: 93  Resp: 20  Temp: 98.7 F (37.1 C)  SpO2: 93%    Constitutional: Alert and oriented. Eyes: Conjunctivae are normal.  Pupils equal, round, and reactive to light bilaterally. Head:  Atraumatic. Nose: No congestion/rhinnorhea. Mouth/Throat: Mucous membranes are moist.  Neck: Supple with no meningismus. Cardiovascular: Normal rate, regular rhythm. Grossly normal heart sounds.  2+ radial pulses bilaterally. Respiratory: Normal respiratory effort.  No retractions. Lungs CTAB. Gastrointestinal: Soft and nontender. No distention. Musculoskeletal: No lower extremity tenderness nor edema.  Neurologic:  Normal speech and language. No gross focal neurologic deficits are appreciated.    ED Results / Procedures / Treatments   Labs (all labs ordered are listed, but only abnormal results are displayed) Labs Reviewed - No data to display   PROCEDURES:  Critical Care performed: No  Procedures   MEDICATIONS ORDERED IN ED: Medications  prochlorperazine (COMPAZINE) injection 10 mg (10 mg Intravenous Given 04/13/22 2003)  diphenhydrAMINE (BENADRYL) injection 25 mg (25 mg Intravenous Given 04/13/22 1957)  lactated ringers bolus 1,000 mL (1,000 mLs Intravenous New Bag/Given 04/13/22 2004)  ketorolac (TORADOL) 30 MG/ML injection 15 mg (15 mg Intravenous Given 04/13/22 2000)  dexamethasone (DECADRON) injection 10 mg (10 mg Intravenous Given 04/13/22 2001)     IMPRESSION / MDM / ASSESSMENT AND PLAN / ED COURSE  I reviewed the triage vital signs  and the nursing notes.                              36 y.o. male with past medical history of hypertension, sick sinus syndrome status post pacemaker, kidney stones, bipolar disorder who presents to the ED complaining of increasing headache over the past 4 days similar to prior migraines but more severe.  Patient's presentation is most consistent with acute, uncomplicated illness.  Differential diagnosis includes, but is not limited to, migraine headache, tension headache, cluster headache, meningitis, SAH.  Patient nontoxic-appearing and in no acute distress, vital signs are unremarkable.  He has no meningismus on exam and no focal  neurologic deficits.  Given gradually worsening headache, doubt SAH and symptoms seem most consistent with migraine headache.  We will hold off on CT imaging, treat with migraine cocktail including Compazine, Benadryl, Toradol, and Decadron.  Plan to reassess following symptomatic management.  On reassessment, patient reports that headache has improved.  He is appropriate for discharge home and will be provided with referral to establish care with neurology given his recurrent migraines.  He will be prescribed Compazine for use as needed, was also counseled to continue taking Excedrin at home.  He was counseled to return to the ED for new or worsening symptoms, patient agrees with plan.      FINAL CLINICAL IMPRESSION(S) / ED DIAGNOSES   Final diagnoses:  Migraine with status migrainosus, not intractable, unspecified migraine type     Rx / DC Orders   ED Discharge Orders          Ordered    prochlorperazine (COMPAZINE) 10 MG tablet  Every 6 hours PRN        04/13/22 2048             Note:  This document was prepared using Dragon voice recognition software and may include unintentional dictation errors.   Chesley Noon, MD 04/13/22 2050

## 2022-04-15 ENCOUNTER — Emergency Department
Admission: EM | Admit: 2022-04-15 | Discharge: 2022-04-15 | Disposition: A | Discharge status: Home / Self Care | Payer: 59 | Attending: Emergency Medicine | Admitting: Emergency Medicine

## 2022-04-15 ENCOUNTER — Emergency Department: Payer: 59

## 2022-04-15 ENCOUNTER — Other Ambulatory Visit: Payer: Self-pay

## 2022-04-15 DIAGNOSIS — K820 Obstruction of gallbladder: Secondary | ICD-10-CM | POA: Diagnosis not present

## 2022-04-15 DIAGNOSIS — K429 Umbilical hernia without obstruction or gangrene: Secondary | ICD-10-CM | POA: Diagnosis not present

## 2022-04-15 DIAGNOSIS — R079 Chest pain, unspecified: Secondary | ICD-10-CM | POA: Diagnosis not present

## 2022-04-15 DIAGNOSIS — Z950 Presence of cardiac pacemaker: Secondary | ICD-10-CM | POA: Diagnosis not present

## 2022-04-15 DIAGNOSIS — Z20822 Contact with and (suspected) exposure to covid-19: Secondary | ICD-10-CM | POA: Diagnosis not present

## 2022-04-15 DIAGNOSIS — R0789 Other chest pain: Secondary | ICD-10-CM | POA: Insufficient documentation

## 2022-04-15 DIAGNOSIS — K760 Fatty (change of) liver, not elsewhere classified: Secondary | ICD-10-CM | POA: Diagnosis not present

## 2022-04-15 LAB — BASIC METABOLIC PANEL
Anion gap: 7 (ref 5–15)
BUN: 31 mg/dL — ABNORMAL HIGH (ref 6–20)
CO2: 27 mmol/L (ref 22–32)
Calcium: 8.8 mg/dL — ABNORMAL LOW (ref 8.9–10.3)
Chloride: 108 mmol/L (ref 98–111)
Creatinine, Ser: 1.28 mg/dL — ABNORMAL HIGH (ref 0.61–1.24)
GFR, Estimated: >60 mL/min (ref >60)
Glucose, Bld: 95 mg/dL (ref 70–99)
Potassium: 3.7 mmol/L (ref 3.5–5.1)
Sodium: 142 mmol/L (ref 135–145)

## 2022-04-15 LAB — CBC
HCT: 45.2 % (ref 39.0–52.0)
Hemoglobin: 15 g/dL (ref 13.0–17.0)
MCH: 29.8 pg (ref 26.0–34.0)
MCHC: 33.2 g/dL (ref 30.0–36.0)
MCV: 89.9 fL (ref 80.0–100.0)
Platelets: 268 10*3/uL (ref 150–400)
RBC: 5.03 MIL/uL (ref 4.22–5.81)
RDW: 13.3 % (ref 11.5–15.5)
WBC: 12.4 10*3/uL — ABNORMAL HIGH (ref 4.0–10.5)
nRBC: 0 % (ref 0.0–0.2)

## 2022-04-15 LAB — RESP PANEL BY RT-PCR (FLU A&B, COVID) ARPGX2
Influenza A by PCR: NEGATIVE
Influenza B by PCR: NEGATIVE
SARS Coronavirus 2 by RT PCR: NEGATIVE

## 2022-04-15 LAB — HEPATIC FUNCTION PANEL
ALT: 19 U/L (ref 0–44)
AST: 16 U/L (ref 15–41)
Albumin: 3.8 g/dL (ref 3.5–5.0)
Alkaline Phosphatase: 58 U/L (ref 38–126)
Bilirubin, Direct: <0.1 mg/dL (ref 0.0–0.2)
Total Bilirubin: 0.6 mg/dL (ref 0.3–1.2)
Total Protein: 6.9 g/dL (ref 6.5–8.1)

## 2022-04-15 LAB — TROPONIN I (HIGH SENSITIVITY)
Troponin I (High Sensitivity): 3 ng/L (ref <18)
Troponin I (High Sensitivity): 3 ng/L (ref <18)

## 2022-04-15 MED ORDER — IOHEXOL 350 MG/ML SOLN
100.0000 mL | Freq: Once | INTRAVENOUS | Status: AC | PRN
  Administered 2022-04-15: 100 mL via INTRAVENOUS

## 2022-04-15 NOTE — ED Provider Notes (Signed)
[facility] Provider Note    Event Date/Time   First MD Initiated Contact with Patient 04/15/22 1125     (approximate)   History   Chest Pain   HPI  Corey [name] is a 36 y.o. male with history of sick sinus syndrome and pacemaker presents emergency department complaining of squeezing type chest pain that started this morning.  His wife took his blood pressure [place] it was elevated but equal in both arms.  Patient does have a family history of aneurysms.  No cough or congestion.  States he has had quite a bit of heartburn.  Was seen here 2 days ago for severe headache.  States the headache has since gone away.  He denies fever, chills, radiation of pain, diaphoresis with pain.  Denies shortness of breath      Physical Exam   Triage Vital Signs: ED Triage Vitals  Enc Vitals Group     BP 04/15/22 1104 (!) 152/108     Pulse Rate 04/15/22 1104 74     Resp 04/15/22 1104 18     Temp 04/15/22 1104 97.9 F (36.6 C)     Temp Source 04/15/22 1104 Oral     SpO2 04/15/22 1104 99 %     Weight 04/15/22 1105 255 lb (115.7 kg)     Height 04/15/22 1105 5\' 11"  (1.803 m)     Head Circumference --      Peak Flow --      Pain Score 04/15/22 1104 6     Pain Loc --      Pain Edu? --      Excl. in GC? --     Most recent vital signs: Vitals:   04/15/22 1104  BP: (!) 152/108  Pulse: 74  Resp: 18  Temp: 97.9 F (36.6 C)  SpO2: 99%     General: Awake, no distress.   CV:  Good peripheral perfusion. regular rate and  rhythm Resp:  Normal effort. Lungs CTA Abd:  No distention.  Nontender, no pulsatile mass noted Other:      ED Results / Procedures / Treatments   Labs (all labs ordered are listed, but only abnormal results are displayed) Labs Reviewed  BASIC METABOLIC PANEL - Abnormal; Notable for the following components:      Result Value   BUN 31 (*)    Creatinine, Ser 1.28 (*)    Calcium 8.8 (*)    All other components within normal limits   CBC - Abnormal; Notable for the following components:   WBC 12.4 (*)    All other components within normal limits  RESP PANEL BY RT-PCR (FLU A&B, COVID) ARPGX2  HEPATIC FUNCTION PANEL  TROPONIN I (HIGH SENSITIVITY)  TROPONIN I (HIGH SENSITIVITY)     EKG  Ekg   RADIOLOGY Chest x-ray, CTA of the chest    PROCEDURES:   Procedures   MEDICATIONS ORDERED IN ED: Medications  iohexol (OMNIPAQUE) 350 MG/ML injection 100 mL (100 mLs Intravenous Contrast Given 04/15/22 1212)     IMPRESSION / MDM / ASSESSMENT AND PLAN / ED COURSE  I reviewed the triage vital signs and the nursing notes.                              Differential diagnosis includes, but is not limited to, MI, GERD, AAA, covid  Patient's presentation is most consistent with acute presentation with potential threat to life or  bodily function.   Labs and imaging ordered  Chest x-ray independently reviewed and interpreted by me as being negative  Due to the patient's family history of aneurysms along with sick sinus syndrome, pacemaker and midsternal chest pain will do CTA for dissection   CTA for dissection independently reviewed and interpreted by me as being negative.  Confirmed by radiology.  Radiologist also states no PE  The patient's labs are reassuring, both troponins are negative, COVID is negative, CBC and metabolic panels are normal  I did explain all the findings to the patient and his wife.  He is to follow-up with his cardiologist at [facility].  Return the emergency department if worsening.  He is in agreement treatment plan.  Discharged in stable condition.   FINAL CLINICAL IMPRESSION(S) / ED DIAGNOSES   Final diagnoses:  Atypical chest pain     Rx / DC Orders   ED Discharge Orders     None        Note:  This document was prepared using Dragon voice recognition software and may include unintentional dictation errors.    Faythe Ghee, PA-C 04/15/22 1410    Merwyn Katos, MD 04/15/22 [phone]

## 2022-04-15 NOTE — ED Triage Notes (Signed)
Pt states he started having chest pain about an hour ago in the center of his chest- pt denies n/v, SHOB- pt denies pain in jaw, arm or back- pt does have a pacemaker- pt was seen here recently for a migraine

## 2022-04-15 NOTE — Discharge Instructions (Signed)
Follow-up with your regular cardiologist.  Please call for an appointment.  Return emergency department if you are worsening

## 2022-04-21 DIAGNOSIS — F411 Generalized anxiety disorder: Secondary | ICD-10-CM | POA: Diagnosis not present

## 2022-04-21 DIAGNOSIS — R69 Illness, unspecified: Secondary | ICD-10-CM | POA: Diagnosis not present

## 2022-04-27 DIAGNOSIS — Z79899 Other long term (current) drug therapy: Secondary | ICD-10-CM | POA: Diagnosis not present

## 2022-05-24 DIAGNOSIS — Z79899 Other long term (current) drug therapy: Secondary | ICD-10-CM | POA: Diagnosis not present

## 2022-05-28 DIAGNOSIS — Z720 Tobacco use: Secondary | ICD-10-CM | POA: Diagnosis not present

## 2022-05-28 DIAGNOSIS — Z950 Presence of cardiac pacemaker: Secondary | ICD-10-CM | POA: Diagnosis not present

## 2022-05-28 DIAGNOSIS — Z880 Allergy status to penicillin: Secondary | ICD-10-CM | POA: Diagnosis not present

## 2022-05-28 DIAGNOSIS — I10 Essential (primary) hypertension: Secondary | ICD-10-CM | POA: Diagnosis not present

## 2022-05-28 DIAGNOSIS — F419 Anxiety disorder, unspecified: Secondary | ICD-10-CM | POA: Diagnosis not present

## 2022-05-28 DIAGNOSIS — R69 Illness, unspecified: Secondary | ICD-10-CM | POA: Diagnosis not present

## 2022-05-28 DIAGNOSIS — M1990 Unspecified osteoarthritis, unspecified site: Secondary | ICD-10-CM | POA: Diagnosis not present

## 2022-05-28 DIAGNOSIS — Z882 Allergy status to sulfonamides status: Secondary | ICD-10-CM | POA: Diagnosis not present

## 2022-05-28 DIAGNOSIS — Z6837 Body mass index (BMI) 37.0-37.9, adult: Secondary | ICD-10-CM | POA: Diagnosis not present

## 2022-06-08 DIAGNOSIS — M109 Gout, unspecified: Secondary | ICD-10-CM | POA: Diagnosis not present

## 2022-06-14 DIAGNOSIS — G4700 Insomnia, unspecified: Secondary | ICD-10-CM | POA: Diagnosis not present

## 2022-06-14 DIAGNOSIS — R69 Illness, unspecified: Secondary | ICD-10-CM | POA: Diagnosis not present

## 2022-06-14 DIAGNOSIS — F411 Generalized anxiety disorder: Secondary | ICD-10-CM | POA: Diagnosis not present

## 2022-06-22 DIAGNOSIS — Z79899 Other long term (current) drug therapy: Secondary | ICD-10-CM | POA: Diagnosis not present

## 2022-07-13 ENCOUNTER — Encounter: Payer: Self-pay | Admitting: Emergency Medicine

## 2022-07-13 ENCOUNTER — Emergency Department
Admission: EM | Admit: 2022-07-13 | Discharge: 2022-07-13 | Disposition: A | Discharge status: Home / Self Care | Payer: 59 | Attending: Emergency Medicine | Admitting: Emergency Medicine

## 2022-07-13 ENCOUNTER — Other Ambulatory Visit: Payer: Self-pay

## 2022-07-13 ENCOUNTER — Emergency Department: Payer: 59

## 2022-07-13 DIAGNOSIS — X58XXXA Exposure to other specified factors, initial encounter: Secondary | ICD-10-CM | POA: Insufficient documentation

## 2022-07-13 DIAGNOSIS — I10 Essential (primary) hypertension: Secondary | ICD-10-CM | POA: Diagnosis not present

## 2022-07-13 DIAGNOSIS — M25512 Pain in left shoulder: Secondary | ICD-10-CM | POA: Diagnosis not present

## 2022-07-13 DIAGNOSIS — S29012A Strain of muscle and tendon of back wall of thorax, initial encounter: Secondary | ICD-10-CM | POA: Diagnosis not present

## 2022-07-13 DIAGNOSIS — S299XXA Unspecified injury of thorax, initial encounter: Secondary | ICD-10-CM | POA: Diagnosis present

## 2022-07-13 LAB — CBC WITH DIFFERENTIAL/PLATELET
Abs Immature Granulocytes: 0.03 10*3/uL (ref 0.00–0.07)
Basophils Absolute: 0.1 10*3/uL (ref 0.0–0.1)
Basophils Relative: 1 %
Eosinophils Absolute: 0.6 10*3/uL — ABNORMAL HIGH (ref 0.0–0.5)
Eosinophils Relative: 7 %
HCT: 46.1 % (ref 39.0–52.0)
Hemoglobin: 15.6 g/dL (ref 13.0–17.0)
Immature Granulocytes: 0 %
Lymphocytes Relative: 25 %
Lymphs Abs: 2 10*3/uL (ref 0.7–4.0)
MCH: 29.8 pg (ref 26.0–34.0)
MCHC: 33.8 g/dL (ref 30.0–36.0)
MCV: 88.1 fL (ref 80.0–100.0)
Monocytes Absolute: 0.4 10*3/uL (ref 0.1–1.0)
Monocytes Relative: 5 %
Neutro Abs: 5 10*3/uL (ref 1.7–7.7)
Neutrophils Relative %: 62 %
Platelets: 290 10*3/uL (ref 150–400)
RBC: 5.23 MIL/uL (ref 4.22–5.81)
RDW: 12.7 % (ref 11.5–15.5)
WBC: 8 10*3/uL (ref 4.0–10.5)
nRBC: 0 % (ref 0.0–0.2)

## 2022-07-13 LAB — COMPREHENSIVE METABOLIC PANEL
ALT: 20 U/L (ref 0–44)
AST: 19 U/L (ref 15–41)
Albumin: 4.2 g/dL (ref 3.5–5.0)
Alkaline Phosphatase: 68 U/L (ref 38–126)
Anion gap: 8 (ref 5–15)
BUN: 18 mg/dL (ref 6–20)
CO2: 23 mmol/L (ref 22–32)
Calcium: 8.9 mg/dL (ref 8.9–10.3)
Chloride: 110 mmol/L (ref 98–111)
Creatinine, Ser: 1.2 mg/dL (ref 0.61–1.24)
GFR, Estimated: >60 mL/min (ref >60)
Glucose, Bld: 96 mg/dL (ref 70–99)
Potassium: 3.7 mmol/L (ref 3.5–5.1)
Sodium: 141 mmol/L (ref 135–145)
Total Bilirubin: 0.7 mg/dL (ref 0.3–1.2)
Total Protein: 7.3 g/dL (ref 6.5–8.1)

## 2022-07-13 LAB — D-DIMER, QUANTITATIVE: D-Dimer, Quant: <0.27 ug/mL-FEU (ref 0.00–0.50)

## 2022-07-13 LAB — TROPONIN I (HIGH SENSITIVITY): Troponin I (High Sensitivity): <2 ng/L (ref <18)

## 2022-07-13 MED ORDER — CYCLOBENZAPRINE HCL 10 MG PO TABS: 10.0000 mg | ORAL_TABLET | Freq: Three times a day (TID) | ORAL | 0 refills | Status: AC | PRN

## 2022-07-13 MED ORDER — ACETAMINOPHEN 500 MG PO TABS: 1000.0000 mg | ORAL_TABLET | Freq: Four times a day (QID) | ORAL | 0 refills | Status: AC | PRN

## 2022-07-13 MED ORDER — LIDOCAINE 5 % EX PTCH: 1.0000 | MEDICATED_PATCH | CUTANEOUS | 0 refills | Status: AC

## 2022-07-13 NOTE — Discharge Instructions (Addendum)
-  You may treat your symptoms with Tylenol and lidocaine patches as needed.  You may additionally take cyclobenzaprine, the use caution as it may make you dizzy/drowsy.  If you are too drowsy during the day, you can opt to just taking 1 before bed at night.  -If your symptoms fail to improve after a few weeks, you may follow-up with the orthopedist listed in these instructions.  -Return to the emergency department anytime if you begin to experience any new or worsening symptoms.

## 2022-07-13 NOTE — ED Provider Triage Note (Signed)
Emergency Medicine Provider Triage Evaluation Note  Corey [name] , a 36 y.o. male  was evaluated in triage.  Pt complains of pleuritic left upper back pain.  Patient has a history of pacemaker, hypertension and GERD.  He reports the pain is not reproduced with palpation.  He has had sporadic cough.  No associated shortness of breath.  No chest tightness or chest pain. Patient is not currently taking any blood thinners. Patient is a daily smoker.   Review of Systems  Positive: Patient has upper back pain.  Negative: No chest pain or chest tightness.   Physical Exam  There were no vitals taken for this visit. Gen:   Awake, no distress   Resp:  Normal effort  MSK:   Moves extremities without difficulty  Other:    Medical Decision Making  Medically screening exam initiated at 4:58 PM.  Appropriate orders placed.  Corey [name] was informed that the remainder of the evaluation will be completed by another provider, this initial triage assessment does not replace that evaluation, and the importance of remaining in the ED until their evaluation is complete.     Pia Mau [place], PA-C 07/13/22 1700

## 2022-07-13 NOTE — ED Triage Notes (Signed)
Pt via POV from home. Pt c/o L shoulder blade pain. Denies any other symptoms. Pt has a hx of pacemaker. Pt states pain is worse with exertion. Pt has a hx of symptomatic bradycardia. Pt is A&Ox4 and NAD

## 2022-07-13 NOTE — ED Provider Notes (Signed)
[facility] Provider Note    Event Date/Time   First MD Initiated Contact with Patient 07/13/22 1738     (approximate)   History   Chief Complaint Shoulder Pain   HPI Maitland Master Touchet is a 36 y.o. male, history of bipolar 1, nephrolithiasis, GERD, hypertension, cannabis use, presents to the emergency department for evaluation of left shoulder pain.  Patient states that he woke up with pain along the subscapular region of his left upper extremity yesterday.  Since then he has had increased pain with any range of motion of his upper extremity.  Denies any recent falls or injuries.  Denies any recent heavy lifting.  Denies chest pain, shortness of breath, abdominal pain, flank pain, nausea/vomiting, diarrhea, dizziness/lightheadedness, weakness, paresthesias, cold sensation, or rash/lesions.  History Limitations: No limitations.        Physical Exam  Triage Vital Signs: ED Triage Vitals  Enc Vitals Group     BP 07/13/22 1701 (!) 133/93     Pulse Rate 07/13/22 1701 83     Resp 07/13/22 1701 18     Temp 07/13/22 1701 98 F (36.7 C)     Temp Source 07/13/22 1701 Oral     SpO2 07/13/22 1701 97 %     Weight 07/13/22 1659 271 lb (122.9 kg)     Height 07/13/22 1659 5\' 11"  (1.803 m)     Head Circumference --      Peak Flow --      Pain Score 07/13/22 1659 2     Pain Loc --      Pain Edu? --      Excl. in [place]? --     Most recent vital signs: Vitals:   07/13/22 1701  BP: (!) 133/93  Pulse: 83  Resp: 18  Temp: 98 F (36.7 C)  SpO2: 97%    General: Awake, NAD.  Skin: Warm, dry. No rashes or lesions.  Eyes: PERRL. Conjunctivae normal.  CV: Good peripheral perfusion.  Resp: Normal effort.  Abd: Soft, non-tender. No distention.  Neuro: At baseline. No gross neurological deficits.  Musculoskeletal: Normal ROM of all extremities.  Focused Exam: No gross deformities to the left upper extremity.  No significant point tenderness along the scapular  region, though he does have some thoracic paraspinal tightness appreciated on the left side.  Physical Exam    ED Results / Procedures / Treatments  Labs (all labs ordered are listed, but only abnormal results are displayed) Labs Reviewed  CBC WITH DIFFERENTIAL/PLATELET - Abnormal; Notable for the following components:      Result Value   Eosinophils Absolute 0.6 (*)    All other components within normal limits  COMPREHENSIVE METABOLIC PANEL  D-DIMER, QUANTITATIVE  TROPONIN I (HIGH SENSITIVITY)     EKG Sinus rhythm, rate of 76, no ST segment changes, normal QRS, no QT prolongation, no axis deviations.    RADIOLOGY  ED Provider Interpretation: I personally reviewed and interpreted this x-ray, no acute cardiopulmonary abnormalities.  No fractures.  DG Chest 2 View  Result Date: 07/13/2022 CLINICAL DATA:  Chest pain EXAM: CHEST - 2 VIEW COMPARISON:  CXR 04/15/22 FINDINGS: Left-sided dual lead cardiac device in place. Compared to prior exam the right atrial lead is directed more laterally. No pleural effusion. No pneumothorax. Unchanged cardiac and mediastinal contours. No focal airspace opacity. No displaced rib fractures. Visualized upper abdomen is unremarkable. IMPRESSION: No radiographic finding to explain chest pain. Compared to prior exam the right atrial lead  directed more laterally, nonspecific Electronically Signed   By: Lorenza Cambridge M.D.   On: 07/13/2022 17:33   DG Shoulder Left  Result Date: 07/13/2022 CLINICAL DATA:  Left shoulder blade pain. History of pacemaker in 2019. EXAM: LEFT SHOULDER - 2+ VIEW COMPARISON:  None Available. FINDINGS: There is no evidence of fracture or dislocation. There is no evidence of arthropathy or other focal bone abnormality. Soft tissues are unremarkable. IMPRESSION: Negative. Electronically Signed   By: Larose Hires D.O.   On: 07/13/2022 17:31    PROCEDURES:  Critical Care performed: N/A.  Procedures    MEDICATIONS ORDERED IN  ED: Medications - No data to display   IMPRESSION / MDM / ASSESSMENT AND PLAN / ED COURSE  I reviewed the triage vital signs and the nursing notes.                              Differential diagnosis includes, but is not limited to, thoracic back strain, ACS, rotator cuff injury, adhesive capsulitis, labral tear, shoulder fracture/dislocation.  ED Course Patient appears well, vitals within normal limits.  NAD.  CBC shows no cytosis or anemia.  CMP shows no electrolyte abnormalities, transaminitis, or AKI.  D-dimer unremarkable.  Initial troponin unremarkable.  Assessment/Plan Patient presents with left shoulder/back pain.  There appears to be some thoracic paraspinal tenderness on the left side.  Lab workup is reassuring.  X-rays show no evidence of fractures or dislocations.  I suspect that he likely has a thoracic back strain.  Will provide him with a prescription for acetaminophen, lidocaine patches, and cyclobenzaprine.  Will provide him with a referral to orthopedics if his symptoms fail to improve after a few weeks.  He was amenable to this plan.  Will discharge.  Provided the patient with anticipatory guidance, return precautions, and educational material. Encouraged the patient to return to the emergency department at any time if they begin to experience any new or worsening symptoms. Patient expressed understanding and agreed with the plan.   Patient's presentation is most consistent with acute complicated illness / injury requiring diagnostic workup.       FINAL CLINICAL IMPRESSION(S) / ED DIAGNOSES   Final diagnoses:  Strain of thoracic back region     Rx / DC Orders   ED Discharge Orders          Ordered    cyclobenzaprine (FLEXERIL) 10 MG tablet  3 times daily PRN        07/13/22 1755    acetaminophen (TYLENOL) 500 MG tablet  Every 6 hours PRN        07/13/22 1755    lidocaine (LIDODERM) 5 %  Every 24 hours        07/13/22 1755             Note:   This document was prepared using Dragon voice recognition software and may include unintentional dictation errors.   Varney Daily, PA 07/13/22 1755    Merwyn Katos, MD 07/13/22 2232

## 2022-10-09 ENCOUNTER — Emergency Department
Admission: EM | Admit: 2022-10-09 | Discharge: 2022-10-09 | Disposition: A | Discharge status: Home / Self Care | Payer: Medicaid Other | Attending: Emergency Medicine | Admitting: Emergency Medicine

## 2022-10-09 ENCOUNTER — Other Ambulatory Visit: Payer: Self-pay

## 2022-10-09 DIAGNOSIS — K0889 Other specified disorders of teeth and supporting structures: Secondary | ICD-10-CM | POA: Diagnosis not present

## 2022-10-09 MED ORDER — CLINDAMYCIN HCL 300 MG PO CAPS: 300.0000 mg | ORAL_CAPSULE | Freq: Three times a day (TID) | ORAL | 0 refills | Status: AC

## 2022-10-09 NOTE — Discharge Instructions (Signed)
Keep your appointment with your dentist.  A prescription for antibiotics was sent to the pharmacy for you to begin taking.  You may also take Tylenol or ibuprofen as needed for dental pain.

## 2022-10-09 NOTE — ED Triage Notes (Signed)
Pt reports broke a tooth while eating earlier this week and thinks it may be infected now. Pt c/o pain to right upper jaw.

## 2022-10-09 NOTE — ED Provider Notes (Signed)
[facility] Provider Note    Event Date/Time   First MD Initiated Contact with Patient 10/09/22 1301     (approximate)   History   Dental Pain   HPI  Corey [name] is a 37 y.o. male   presents to the ED with complaint of tooth pain.  Patient states that earlier this week he broke a tooth while eating and was seen at urgent care at which time they decided that he did not need an antibiotic.  He states that the pain has increased and he now has some swelling around his tooth.  He is already made arrangements to be seen by dentist next week.      Physical Exam   Triage Vital Signs: ED Triage Vitals  Enc Vitals Group     BP 10/09/22 1230 123/83     Pulse Rate 10/09/22 1230 86     Resp 10/09/22 1230 17     Temp 10/09/22 1230 98.1 F (36.7 C)     Temp Source 10/09/22 1230 Oral     SpO2 10/09/22 1230 97 %     Weight 10/09/22 1219 268 lb 15.4 oz (122 kg)     Height 10/09/22 1219 5\' 11"  (1.803 m)     Head Circumference --      Peak Flow --      Pain Score 10/09/22 1219 9     Pain Loc --      Pain Edu? --      Excl. in [place]? --     Most recent vital signs: Vitals:   10/09/22 1230  BP: 123/83  Pulse: 86  Resp: 17  Temp: 98.1 F (36.7 C)  SpO2: 97%     General: Awake, no distress.  CV:  Good peripheral perfusion.  Resp:  Normal effort.  Abd:  No distention.  Other:  Right upper posterior molar and poor dental repair but no active drainage is noted from the area.  Moderately tender to palpation.   ED Results / Procedures / Treatments   Labs (all labs ordered are listed, but only abnormal results are displayed) Labs Reviewed - No data to display   PROCEDURES:  Critical Care performed:   Procedures   MEDICATIONS ORDERED IN ED: Medications - No data to display   IMPRESSION / MDM / [place] / ED COURSE  I reviewed the triage vital signs and the nursing notes.   Differential diagnosis includes, but is not limited  to, dental pain, dental carry, dental abscess.  37 year old male presents to the ED with complaint of dental pain and history of a recent broken tooth.  Patient was started on clindamycin 3 times daily for 7 days and he is encouraged to keep his appointment with his dentist next week.      Patient's presentation is most consistent with acute complicated illness / injury requiring diagnostic workup.  FINAL CLINICAL IMPRESSION(S) / ED DIAGNOSES   Final diagnoses:  Pain, dental     Rx / DC Orders   ED Discharge Orders          Ordered    clindamycin (CLEOCIN) 300 MG capsule  3 times daily        10/09/22 1335             Note:  This document was prepared using Dragon voice recognition software and may include unintentional dictation errors.   Johnn Hai, PA-C 10/09/22 1513    Harvest Dark, MD 10/09/22  1845  

## 2022-10-13 ENCOUNTER — Other Ambulatory Visit: Payer: Self-pay

## 2022-10-13 ENCOUNTER — Emergency Department
Admission: EM | Admit: 2022-10-13 | Discharge: 2022-10-13 | Disposition: A | Discharge status: Home / Self Care | Payer: Medicaid Other | Attending: Emergency Medicine | Admitting: Emergency Medicine

## 2022-10-13 DIAGNOSIS — K2901 Acute gastritis with bleeding: Secondary | ICD-10-CM | POA: Diagnosis not present

## 2022-10-13 DIAGNOSIS — K920 Hematemesis: Secondary | ICD-10-CM

## 2022-10-13 DIAGNOSIS — R112 Nausea with vomiting, unspecified: Secondary | ICD-10-CM | POA: Diagnosis present

## 2022-10-13 LAB — COMPREHENSIVE METABOLIC PANEL
ALT: 24 U/L (ref 0–44)
AST: 21 U/L (ref 15–41)
Albumin: 4.3 g/dL (ref 3.5–5.0)
Alkaline Phosphatase: 64 U/L (ref 38–126)
Anion gap: 10 (ref 5–15)
BUN: 19 mg/dL (ref 6–20)
CO2: 22 mmol/L (ref 22–32)
Calcium: 9 mg/dL (ref 8.9–10.3)
Chloride: 109 mmol/L (ref 98–111)
Creatinine, Ser: 0.96 mg/dL (ref 0.61–1.24)
GFR, Estimated: >60 mL/min (ref >60)
Glucose, Bld: 115 mg/dL — ABNORMAL HIGH (ref 70–99)
Potassium: 3.6 mmol/L (ref 3.5–5.1)
Sodium: 141 mmol/L (ref 135–145)
Total Bilirubin: 0.7 mg/dL (ref 0.3–1.2)
Total Protein: 8 g/dL (ref 6.5–8.1)

## 2022-10-13 LAB — CBC
HCT: 46.7 % (ref 39.0–52.0)
Hemoglobin: 15.8 g/dL (ref 13.0–17.0)
MCH: 29.7 pg (ref 26.0–34.0)
MCHC: 33.8 g/dL (ref 30.0–36.0)
MCV: 87.8 fL (ref 80.0–100.0)
Platelets: 325 10*3/uL (ref 150–400)
RBC: 5.32 MIL/uL (ref 4.22–5.81)
RDW: 12.8 % (ref 11.5–15.5)
WBC: 10.9 10*3/uL — ABNORMAL HIGH (ref 4.0–10.5)
nRBC: 0 % (ref 0.0–0.2)

## 2022-10-13 LAB — LIPASE, BLOOD: Lipase: 36 U/L (ref 11–51)

## 2022-10-13 MED ORDER — HYDROCODONE-ACETAMINOPHEN 5-325 MG PO TABS: 1.0000 | ORAL_TABLET | Freq: Four times a day (QID) | ORAL | 0 refills | Status: DC | PRN

## 2022-10-13 MED ORDER — ONDANSETRON 8 MG PO TBDP: 8.0000 mg | ORAL_TABLET | Freq: Three times a day (TID) | ORAL | 0 refills | Status: DC | PRN

## 2022-10-13 NOTE — ED Triage Notes (Addendum)
At work today vomited and saw some blood in it.  Started clindamycin on Saturday for tooth abscess.  Stopped clindamycin and started on Erythromycin.  Has also been taking alleve  AAOx3.  Skin warm and dry. NAD

## 2022-10-13 NOTE — ED Provider Notes (Signed)
[facility] Provider Note   Event Date/Time   First MD Initiated Contact with Patient 10/13/22 1942     (approximate) History  Vomiting  HPI Corey [name] is a 37 y.o. male with a stated recent past medical history of a right upper wisdom tooth dental abscess who presents complaining of 1 episode of bloody vomiting as well as emesis after starting his clindamycin.  Patient states that he switched from clindamycin to erythromycin with the first dose being today and patient states his nausea has improved.  Patient states that he wanted to be checked out due to the bloody vomit.  Patient has not had any subsequent episodes. ROS: Patient currently denies any vision changes, tinnitus, difficulty speaking, facial droop, sore throat, chest pain, shortness of breath, abdominal pain, diarrhea, dysuria, or weakness/numbness/paresthesias in any extremity   Physical Exam  Triage Vital Signs: ED Triage Vitals  Enc Vitals Group     BP 10/13/22 1635 131/82     Pulse Rate 10/13/22 1635 85     Resp 10/13/22 1635 16     Temp 10/13/22 1635 98.4 F (36.9 C)     Temp src --      SpO2 10/13/22 1635 97 %     Weight 10/13/22 1635 277 lb (125.6 kg)     Height 10/13/22 1638 '5\' 11"'$  (1.803 m)     Head Circumference --      Peak Flow --      Pain Score 10/13/22 1638 0     Pain Loc --      Pain Edu? --      Excl. in [place]? --    Most recent vital signs: Vitals:   10/13/22 1635 10/13/22 1947  BP: 131/82 125/84  Pulse: 85 82  Resp: 16 18  Temp: 98.4 F (36.9 C)   SpO2: 97% 97%   General: Awake, oriented x4. CV:  Good peripheral perfusion.  Resp:  Normal effort.  Abd:  No distention.  Other:  Middle-aged overweight Caucasian male laying in bed in no acute distress.  Left maxillary wisdom tooth with erythema surrounding gumline and small amount of purulent drainage ED Results / Procedures / Treatments  Labs (all labs ordered are listed, but only abnormal results are  displayed) Labs Reviewed  COMPREHENSIVE METABOLIC PANEL - Abnormal; Notable for the following components:      Result Value   Glucose, Bld 115 (*)    All other components within normal limits  CBC - Abnormal; Notable for the following components:   WBC 10.9 (*)    All other components within normal limits  LIPASE, BLOOD  URINALYSIS, ROUTINE W REFLEX MICROSCOPIC  PROCEDURES: Critical Care performed: No .1-3 Lead EKG Interpretation  Performed by: Naaman Plummer, MD Authorized by: Naaman Plummer, MD     Interpretation: normal     ECG rate:  71   ECG rate assessment: normal     Rhythm: sinus rhythm     Ectopy: none     Conduction: normal    MEDICATIONS ORDERED IN ED: Medications - No data to display IMPRESSION / MDM / [place] / ED COURSE  I reviewed the triage vital signs and the nursing notes.                             The patient is on the cardiac monitor to evaluate for evidence of arrhythmia and/or significant heart rate changes.  Patient's presentation is most consistent with acute presentation with potential threat to life or bodily function. Patient presents for acute nausea/vomiting The cause of the patients symptoms is not clear, but the patient is overall well appearing and is suspected to have a transient course of illness.  Given History and Exam there does not appear to be an emergent cause of the symptoms such as small bowel obstruction, coronary syndrome, bowel ischemia, DKA, pancreatitis, appendicitis, other acute abdomen or other emergent problem.  the patient is feeling much better after the medication change, tolerating PO fluids, and shows no signs of dehydration.  Rx: Zofran, Norco as needed Instructed patient to refrain from any NSAIDs including aspirin, ibuprofen, or naproxen Disposition: Discharge home with prompt primary care physician follow up in the next 48 hours. Strict return precautions discussed.   FINAL CLINICAL IMPRESSION(S) / ED  DIAGNOSES   Final diagnoses:  Hematemesis with nausea  Acute gastritis with hemorrhage, unspecified gastritis type   Rx / DC Orders   ED Discharge Orders          Ordered    ondansetron (ZOFRAN-ODT) 8 MG disintegrating tablet  Every 8 hours PRN        10/13/22 2027    HYDROcodone-acetaminophen (NORCO) 5-325 MG tablet  Every 6 hours PRN        10/13/22 2027           Note:  This document was prepared using Dragon voice recognition software and may include unintentional dictation errors.   Naaman Plummer, MD 10/13/22 2030

## 2022-10-14 ENCOUNTER — Telehealth: Payer: Self-pay | Admitting: Emergency Medicine

## 2022-10-14 MED ORDER — HYDROCODONE-ACETAMINOPHEN 5-325 MG PO TABS: 1.0000 | ORAL_TABLET | Freq: Four times a day (QID) | ORAL | 0 refills | Status: AC | PRN

## 2022-10-14 NOTE — Telephone Encounter (Signed)
Emergency department follow-up note  Contacted by nursing staff regarding patient's prescription medication.  Stating that the pharmacy does not have his prescription for Norco and will need it sent to a new [facility] on [address].  New prescription placed and patient contacted

## 2023-01-27 ENCOUNTER — Emergency Department
Admission: EM | Admit: 2023-01-27 | Discharge: 2023-01-27 | Disposition: A | Discharge status: Home / Self Care | Payer: Medicaid Other | Attending: Emergency Medicine | Admitting: Emergency Medicine

## 2023-01-27 ENCOUNTER — Other Ambulatory Visit: Payer: Self-pay

## 2023-01-27 DIAGNOSIS — G43909 Migraine, unspecified, not intractable, without status migrainosus: Secondary | ICD-10-CM | POA: Diagnosis present

## 2023-01-27 DIAGNOSIS — G43009 Migraine without aura, not intractable, without status migrainosus: Secondary | ICD-10-CM

## 2023-01-27 LAB — CBC
HCT: 47.8 % (ref 39.0–52.0)
Hemoglobin: 15.7 g/dL (ref 13.0–17.0)
MCH: 28.8 pg (ref 26.0–34.0)
MCHC: 32.8 g/dL (ref 30.0–36.0)
MCV: 87.5 fL (ref 80.0–100.0)
Platelets: 343 10*3/uL (ref 150–400)
RBC: 5.46 MIL/uL (ref 4.22–5.81)
RDW: 12.3 % (ref 11.5–15.5)
WBC: 11.7 10*3/uL — ABNORMAL HIGH (ref 4.0–10.5)
nRBC: 0 % (ref 0.0–0.2)

## 2023-01-27 LAB — BASIC METABOLIC PANEL
Anion gap: 12 (ref 5–15)
BUN: 15 mg/dL (ref 6–20)
CO2: 19 mmol/L — ABNORMAL LOW (ref 22–32)
Calcium: 8.6 mg/dL — ABNORMAL LOW (ref 8.9–10.3)
Chloride: 107 mmol/L (ref 98–111)
Creatinine, Ser: 1.01 mg/dL (ref 0.61–1.24)
GFR, Estimated: >60 mL/min (ref >60)
Glucose, Bld: 99 mg/dL (ref 70–99)
Potassium: 3.6 mmol/L (ref 3.5–5.1)
Sodium: 138 mmol/L (ref 135–145)

## 2023-01-27 MED ORDER — METOCLOPRAMIDE HCL 5 MG/ML IJ SOLN
10.0000 mg | Freq: Once | INTRAMUSCULAR | Status: AC
  Administered 2023-01-27: 10 mg via INTRAVENOUS
  Filled 2023-01-27: qty 2

## 2023-01-27 MED ORDER — SUMATRIPTAN SUCCINATE 6 MG/0.5ML ~~LOC~~ SOLN
6.0000 mg | Freq: Once | SUBCUTANEOUS | Status: AC
  Administered 2023-01-27: 6 mg via SUBCUTANEOUS
  Filled 2023-01-27: qty 0.5

## 2023-01-27 MED ORDER — DIPHENHYDRAMINE HCL 50 MG/ML IJ SOLN
12.5000 mg | Freq: Once | INTRAMUSCULAR | Status: AC
  Administered 2023-01-27: 12.5 mg via INTRAVENOUS
  Filled 2023-01-27: qty 1

## 2023-01-27 MED ORDER — SODIUM CHLORIDE 0.9 % IV BOLUS
1000.0000 mL | Freq: Once | INTRAVENOUS | Status: AC
  Administered 2023-01-27: 1000 mL via INTRAVENOUS

## 2023-01-27 MED ORDER — KETOROLAC TROMETHAMINE 30 MG/ML IJ SOLN
30.0000 mg | Freq: Once | INTRAMUSCULAR | Status: AC
  Administered 2023-01-27: 30 mg via INTRAVENOUS
  Filled 2023-01-27: qty 1

## 2023-01-27 MED ORDER — PROCHLORPERAZINE MALEATE 10 MG PO TABS: 10.0000 mg | ORAL_TABLET | Freq: Four times a day (QID) | ORAL | 0 refills | Status: AC | PRN

## 2023-01-27 NOTE — ED Provider Notes (Signed)
[facility] Provider Note    Event Date/Time   First MD Initiated Contact with Patient 01/27/23 1920     (approximate)   History   Migraine   HPI  Corey [name] is a 37 y.o. male with PMH of migraines who presents with a migraine.  He has been unable to manage his symptoms at home, he tried taking Tylenol and a leftover Percocet but has not been able to manage his pain.  Patient describes his migraines as occurring once every 2 to 3 months.  He does not have an aura.  Today he has severe pain in the back of his head that radiates down into his neck.      Physical Exam   Triage Vital Signs: ED Triage Vitals  Enc Vitals Group     BP 01/27/23 1823 137/80     Pulse Rate 01/27/23 1823 84     Resp 01/27/23 1823 20     Temp 01/27/23 1823 98.1 F (36.7 C)     Temp src --      SpO2 01/27/23 1823 96 %     Weight 01/27/23 1824 257 lb (116.6 kg)     Height 01/27/23 1824 5\' 11"  (1.803 m)     Head Circumference --      Peak Flow --      Pain Score 01/27/23 1823 8     Pain Loc --      Pain Edu? --      Excl. in [place]? --     Most recent vital signs: Vitals:   01/27/23 2216 01/27/23 2227  BP: 122/80   Pulse: 85   Resp: 14   Temp:  98.7 F (37.1 C)  SpO2: 96%      General: Awake, no distress.  Ice pack on the back of the neck sitting in the room with the lights off. CV:  Good peripheral perfusion.  Resp:  Normal effort.  Abd:  No distention.  Other:  No focal neurodeficits.   ED Results / Procedures / Treatments   Labs (all labs ordered are listed, but only abnormal results are displayed) Labs Reviewed  CBC - Abnormal; Notable for the following components:      Result Value   WBC 11.7 (*)    All other components within normal limits  BASIC METABOLIC PANEL - Abnormal; Notable for the following components:   CO2 19 (*)    Calcium 8.6 (*)    All other components within normal limits     PROCEDURES:  Critical Care performed:  No  Procedures   MEDICATIONS ORDERED IN ED: Medications  sodium chloride 0.9 % bolus 1,000 mL (0 mLs Intravenous Stopped 01/27/23 2218)  metoCLOPramide (REGLAN) injection 10 mg (10 mg Intravenous Given 01/27/23 2120)  ketorolac (TORADOL) 30 MG/ML injection 30 mg (30 mg Intravenous Given 01/27/23 2119)  diphenhydrAMINE (BENADRYL) injection 12.5 mg (12.5 mg Intravenous Given 01/27/23 2117)  SUMAtriptan (IMITREX) injection 6 mg (6 mg Subcutaneous Given 01/27/23 2213)     IMPRESSION / MDM / ASSESSMENT AND PLAN / ED COURSE  I reviewed the triage vital signs and the nursing notes.                             Patient presented with symptoms of a migraine. Differential diagnosis includes, but is not limited to, tension headache, cluster headache, migraine, meningitis.  Patient's presentation is most consistent with acute complicated illness /  injury requiring diagnostic workup.  Patient describes his symptoms as being similar to his previous migraines.  While in the ED he received a fluid bolus and a migraine cocktail including Toradol, Benadryl, Reglan and Imitrex.  On my initial exam patient reported pain at an 8/10.  After receiving the medications he reported an improvement in his pain.  Basic blood work was WNL aside from a mildly elevated WBC and low CO2 and calcium.  He does not have a primary care provider at this point so I will place a referral for him.  Patient mentioned that when he was seen in this ED last year for similar symptoms he was given a prescription for prochlorperazine and has found this to be effective at managing his migraines at home, so I will renew this prescription.  Patient was agreeable to plan, all questions were answered and he was stable at discharge.    FINAL CLINICAL IMPRESSION(S) / ED DIAGNOSES   Final diagnoses:  Migraine without aura and without status migrainosus, not intractable     Rx / DC Orders   ED Discharge Orders          Ordered    Ambulatory  Referral to Primary Care (Establish Care)        01/27/23 2028    prochlorperazine (COMPAZINE) 10 MG tablet  Every 6 hours PRN        01/27/23 2245             Note:  This document was prepared using Dragon voice recognition software and may include unintentional dictation errors.   Cameron Ali, PA-C 01/27/23 2253    Sharman Cheek, MD 01/27/23 [phone]

## 2023-01-27 NOTE — ED Triage Notes (Signed)
Pt to ED for migraine that started yesterday. Hx of same. Reports taking rescue meds and percocet PTA. Also reports feeling like having panic attack d/t needing oral surgery Monday.  +nausea Pt in NAD, RR Even and unlabored.

## 2023-01-27 NOTE — Discharge Instructions (Signed)
You can take prochlorperazine (Compazine) 10 mg every 6 hours as needed.  Do not exceed 40 mg in a 24-hour period.

## 2023-03-24 ENCOUNTER — Emergency Department
Admission: EM | Admit: 2023-03-24 | Discharge: 2023-03-24 | Disposition: A | Discharge status: Home / Self Care | Payer: MEDICAID | Attending: Emergency Medicine | Admitting: Emergency Medicine

## 2023-03-24 ENCOUNTER — Other Ambulatory Visit: Payer: Self-pay

## 2023-03-24 DIAGNOSIS — G43909 Migraine, unspecified, not intractable, without status migrainosus: Secondary | ICD-10-CM | POA: Diagnosis present

## 2023-03-24 DIAGNOSIS — I10 Essential (primary) hypertension: Secondary | ICD-10-CM | POA: Insufficient documentation

## 2023-03-24 DIAGNOSIS — G43009 Migraine without aura, not intractable, without status migrainosus: Secondary | ICD-10-CM | POA: Diagnosis not present

## 2023-03-24 LAB — CBC WITH DIFFERENTIAL/PLATELET
Abs Immature Granulocytes: 0.03 10*3/uL (ref 0.00–0.07)
Basophils Absolute: 0 10*3/uL (ref 0.0–0.1)
Basophils Relative: 0 %
Eosinophils Absolute: 0.5 10*3/uL (ref 0.0–0.5)
Eosinophils Relative: 5 %
HCT: 46 % (ref 39.0–52.0)
Hemoglobin: 15.5 g/dL (ref 13.0–17.0)
Immature Granulocytes: 0 %
Lymphocytes Relative: 24 %
Lymphs Abs: 2.2 10*3/uL (ref 0.7–4.0)
MCH: 29 pg (ref 26.0–34.0)
MCHC: 33.7 g/dL (ref 30.0–36.0)
MCV: 86 fL (ref 80.0–100.0)
Monocytes Absolute: 0.4 10*3/uL (ref 0.1–1.0)
Monocytes Relative: 4 %
Neutro Abs: 5.9 10*3/uL (ref 1.7–7.7)
Neutrophils Relative %: 67 %
Platelets: 284 10*3/uL (ref 150–400)
RBC: 5.35 MIL/uL (ref 4.22–5.81)
RDW: 13.1 % (ref 11.5–15.5)
WBC: 9.1 10*3/uL (ref 4.0–10.5)
nRBC: 0 % (ref 0.0–0.2)

## 2023-03-24 LAB — COMPREHENSIVE METABOLIC PANEL
ALT: 17 U/L (ref 0–44)
AST: 16 U/L (ref 15–41)
Albumin: 4.2 g/dL (ref 3.5–5.0)
Alkaline Phosphatase: 83 U/L (ref 38–126)
Anion gap: 9 (ref 5–15)
BUN: 16 mg/dL (ref 6–20)
CO2: 23 mmol/L (ref 22–32)
Calcium: 8.9 mg/dL (ref 8.9–10.3)
Chloride: 106 mmol/L (ref 98–111)
Creatinine, Ser: 1 mg/dL (ref 0.61–1.24)
GFR, Estimated: >60 mL/min (ref >60)
Glucose, Bld: 122 mg/dL — ABNORMAL HIGH (ref 70–99)
Potassium: 3.8 mmol/L (ref 3.5–5.1)
Sodium: 138 mmol/L (ref 135–145)
Total Bilirubin: 0.7 mg/dL (ref 0.3–1.2)
Total Protein: 7.5 g/dL (ref 6.5–8.1)

## 2023-03-24 MED ORDER — SODIUM CHLORIDE 0.9 % IV BOLUS
1000.0000 mL | Freq: Once | INTRAVENOUS | Status: AC
  Administered 2023-03-24: 1000 mL via INTRAVENOUS

## 2023-03-24 MED ORDER — SUMATRIPTAN SUCCINATE 6 MG/0.5ML ~~LOC~~ SOLN
6.0000 mg | Freq: Once | SUBCUTANEOUS | Status: AC
  Administered 2023-03-24: 6 mg via SUBCUTANEOUS
  Filled 2023-03-24: qty 0.5

## 2023-03-24 MED ORDER — METOCLOPRAMIDE HCL 5 MG/ML IJ SOLN
10.0000 mg | Freq: Once | INTRAMUSCULAR | Status: AC
  Administered 2023-03-24: 10 mg via INTRAVENOUS
  Filled 2023-03-24: qty 2

## 2023-03-24 MED ORDER — KETOROLAC TROMETHAMINE 30 MG/ML IJ SOLN
30.0000 mg | Freq: Once | INTRAMUSCULAR | Status: AC
  Administered 2023-03-24: 30 mg via INTRAVENOUS
  Filled 2023-03-24: qty 1

## 2023-03-24 MED ORDER — DIPHENHYDRAMINE HCL 50 MG/ML IJ SOLN
12.5000 mg | Freq: Once | INTRAMUSCULAR | Status: AC
  Administered 2023-03-24: 12.5 mg via INTRAVENOUS
  Filled 2023-03-24: qty 1

## 2023-03-24 MED ORDER — SUMATRIPTAN SUCCINATE 50 MG PO TABS: 50.0000 mg | ORAL_TABLET | Freq: Once | ORAL | 2 refills | Status: AC | PRN

## 2023-03-24 NOTE — ED Triage Notes (Signed)
Pt reports recurrent migraine since January. Reports often triggered by stress. This episode onset yesterday and no relief with tylenol. Pt reports sensitivity to light. Pt requesting referral for neurology. Pt alert and oriented on arrival. Breathing unlabored speaking in full sentences. Symmetric chest rise and fall. Denies fall or head injury.

## 2023-03-24 NOTE — ED Provider Notes (Signed)
[facility] Provider Note    Event Date/Time   First MD Initiated Contact with Patient 03/24/23 2108     (approximate)   History   Migraine   HPI  Corey [name] is a 37 y.o. male with PMH of migraines, HTN, schizoaffective disorder and bipolar disorder who presents with a migraine.  Patient states she has had recurrent migraines since January and feels they are often triggered by stress.  This migraine began yesterday and he tried taking Tylenol but this did not help.   Patient would like a referral for neurology.     Physical Exam   Triage Vital Signs: ED Triage Vitals [03/24/23 2002]  Encounter Vitals Group     BP 123/83     Systolic BP Percentile      Diastolic BP Percentile      Pulse Rate 85     Resp 18     Temp 98.3 F (36.8 C)     Temp Source Oral     SpO2 97 %     Weight 251 lb (113.9 kg)     Height 5\' 11"  (1.803 m)     Head Circumference      Peak Flow      Pain Score 8     Pain Loc      Pain Education      Exclude from Growth Chart     Most recent vital signs: Vitals:   03/24/23 2002 03/24/23 2055  BP: 123/83   Pulse: 85   Resp: 18   Temp: 98.3 F (36.8 C)   SpO2: 97% 97%     General: Awake, mild distress, laying in bed with sunglasses on and the lights off. CV:  Good peripheral perfusion. RRR. Resp:  Normal effort.  CTAB. Abd:  No distention.  Other:  No focal neurodeficits.   ED Results / Procedures / Treatments   Labs (all labs ordered are listed, but only abnormal results are displayed) Labs Reviewed  COMPREHENSIVE METABOLIC PANEL - Abnormal; Notable for the following components:      Result Value   Glucose, Bld 122 (*)    All other components within normal limits  CBC WITH DIFFERENTIAL/PLATELET     PROCEDURES:  Critical Care performed: No  Procedures   MEDICATIONS ORDERED IN ED: Medications  sodium chloride 0.9 % bolus 1,000 mL (1,000 mLs Intravenous New Bag/Given 03/24/23 2139)   metoCLOPramide (REGLAN) injection 10 mg (10 mg Intravenous Given 03/24/23 2137)  ketorolac (TORADOL) 30 MG/ML injection 30 mg (30 mg Intravenous Given 03/24/23 2136)  SUMAtriptan (IMITREX) injection 6 mg (6 mg Subcutaneous Given 03/24/23 2135)  diphenhydrAMINE (BENADRYL) injection 12.5 mg (12.5 mg Intravenous Given 03/24/23 2138)     IMPRESSION / MDM / ASSESSMENT AND PLAN / ED COURSE  I reviewed the triage vital signs and the nursing notes.                             37 year old male with history of migraines presents with a migraine headache he has been unable to control at home.  VSS in triage, patient in mild distress on exam.  Differential diagnosis includes, but is not limited to, migraine, tension headache, cluster headache, stroke.  Patient's presentation is most consistent with acute, uncomplicated illness.  CMP and CBC unremarkable.  Patient's diagnosis is most consistent with a migraine.  He was treated with a migraine cocktail including Toradol, Reglan, Benadryl, Imitrex and  fluids.  Patient reported an improvement in his symptoms after the medication administration.  I will send patient home with a prescription for Imitrex.  He was advised on how to take the medication.  I will also give him neurology follow-up with Dr. Sherryll Burger.   Patient was agreeable to plan, all questions were answered and he was stable at discharge.      FINAL CLINICAL IMPRESSION(S) / ED DIAGNOSES   Final diagnoses:  Migraine without aura and without status migrainosus, not intractable     Rx / DC Orders   ED Discharge Orders          Ordered    SUMAtriptan (IMITREX) 50 MG tablet  Once PRN        03/24/23 2248             Note:  This document was prepared using Dragon voice recognition software and may include unintentional dictation errors.   Cameron Ali, PA-C 03/24/23 2253    Dionne Bucy, MD 03/24/23 2326

## 2023-03-24 NOTE — Discharge Instructions (Addendum)
You can take 50 mg of Imitrex as needed for migraine.  You can repeat the dose in 2 hours if your headache persists.  Do not exceed more than 200 mg in a 24-hour period.  Please follow-up with Dr. Sherryll Burger, with a neurologist.  His information is attached.  Please call his office and schedule an appointment, mention that you have been treated in the ED twice in the last 3 months for a migraine.  As always, you can return to the ED with any new or worsening symptoms.  It was a pleasure to care for you today!

## 2023-04-06 ENCOUNTER — Other Ambulatory Visit: Payer: Self-pay | Admitting: Student

## 2023-04-06 DIAGNOSIS — R519 Headache, unspecified: Secondary | ICD-10-CM

## 2023-04-13 ENCOUNTER — Ambulatory Visit
Admission: RE | Admit: 2023-04-13 | Discharge: 2023-04-13 | Disposition: A | Discharge status: Home / Self Care | Payer: MEDICAID | Source: Ambulatory Visit | Attending: Student | Admitting: Student

## 2023-04-13 DIAGNOSIS — R519 Headache, unspecified: Secondary | ICD-10-CM

## 2023-05-27 ENCOUNTER — Emergency Department
Admission: EM | Admit: 2023-05-27 | Discharge: 2023-05-27 | Disposition: A | Discharge status: Home / Self Care | Payer: MEDICAID | Attending: Emergency Medicine | Admitting: Emergency Medicine

## 2023-05-27 ENCOUNTER — Other Ambulatory Visit: Payer: Self-pay

## 2023-05-27 ENCOUNTER — Emergency Department: Payer: MEDICAID

## 2023-05-27 DIAGNOSIS — M7522 Bicipital tendinitis, left shoulder: Secondary | ICD-10-CM | POA: Insufficient documentation

## 2023-05-27 DIAGNOSIS — M75102 Unspecified rotator cuff tear or rupture of left shoulder, not specified as traumatic: Secondary | ICD-10-CM | POA: Diagnosis not present

## 2023-05-27 DIAGNOSIS — M25512 Pain in left shoulder: Secondary | ICD-10-CM | POA: Diagnosis present

## 2023-05-27 MED ORDER — PREDNISONE 10 MG PO TABS: 10.0000 mg | ORAL_TABLET | Freq: Every day | ORAL | 0 refills | Status: AC

## 2023-05-27 MED ORDER — KETOROLAC TROMETHAMINE 30 MG/ML IJ SOLN
30.0000 mg | Freq: Once | INTRAMUSCULAR | Status: AC
  Administered 2023-05-27: 30 mg via INTRAMUSCULAR
  Filled 2023-05-27: qty 1

## 2023-05-27 NOTE — ED Provider Notes (Signed)
[place] EM[facility]GENCY DEPARTMENT AT [facility] Provider Note   CSN: [id number] Arrival date & time: 05/27/23  1945     History  Chief Complaint  Patient presents with   Shoulder Injury    Corey [name] is a 37 y.o. male presents to the emergency department for evaluation of left shoulder pain.  5 days ago states he was pulling a heavy mattress, did not feel any significant pain or discomfort while performing the activity but several days later developed pain.  He states over the last 2 days has had increasing pain in the left shoulder which has resulted in pain with overhead lifting pushing and pulling.  He has pain along the anterior shoulder as well as lateral shoulder.  No numbness or tingling.  He is unable to take NSAIDs.  Has taken some Tylenol with little relief.  HPI     Home Medications Prior to Admission medications   Medication Sig Start Date End Date Taking? Authorizing Provider  predniSONE (DELTASONE) 10 MG tablet Take 1 tablet (10 mg total) by mouth daily. 6,5,4,3,2,1 six day taper 05/27/23  Yes Evon Slack, PA-C  Brexpiprazole (REXULTI) 3 MG TABS Take 3 mg by mouth at bedtime.    [provider]  clonazePAM (KLONOPIN) 1 MG tablet Take 1 mg by mouth 2 (two) times daily as needed for anxiety. 09/05/18   [provider]  dicyclomine (BENTYL) 10 MG capsule Take 1 capsule (10 mg total) by mouth 3 (three) times daily as needed for up to 5 days (abdominal cramping, diarrhea). 01/25/20 01/30/20  Dionne Bucy, MD  divalproex (DEPAKOTE ER) 500 MG 24 hr tablet Take 2 tablets (1,000 mg total) by mouth at bedtime. 06/16/20   [name], [name], MD  Doxepin HCl 6 MG TABS Take 6 mg by mouth Nightly. 10/04/18   [provider]  Eszopiclone (ESZOPICLONE) 3 MG TABS Take 3 mg by mouth at bedtime. Take immediately before bedtime    [provider]  FLUoxetine (PROZAC) 20 MG capsule Take 1 capsule (20 mg total) by mouth daily. 06/16/20    [name], [name], MD  HYDROcodone-acetaminophen (NORCO) 5-325 MG tablet Take 1 tablet by mouth every 6 (six) hours as needed for moderate pain or severe pain. 10/14/22   Merwyn Katos, MD  hydrOXYzine (VISTARIL) 25 MG capsule Take 25-50 mg by mouth 4 (four) times daily as needed.    [provider]  nitroGLYCERIN (NITROSTAT) 0.4 MG SL tablet Place 1 tablet (0.4 mg total) under the tongue every 5 (five) minutes as needed for chest pain. 08/16/18 08/16/19  [name], Ellin Goodie, MD  ondansetron (ZOFRAN-ODT) 8 MG disintegrating tablet Take 1 tablet (8 mg total) by mouth every 8 (eight) hours as needed for nausea or vomiting. 10/13/22   Merwyn Katos, MD  pantoprazole (PROTONIX) 40 MG tablet Take 1 tablet (40 mg total) by mouth daily. 08/03/20 10/02/20  Minna Antis, MD  prochlorperazine (COMPAZINE) 10 MG tablet Take 1 tablet (10 mg total) by mouth every 6 (six) hours as needed for nausea or vomiting. 01/27/23   Cameron Ali, PA-C  QUEtiapine (SEROQUEL) 100 MG tablet Take 100 mg by mouth Nightly. 10/03/18   [provider]  risperiDONE (RISPERDAL) 2 MG tablet Take 2 tablets (4 mg total) by mouth at bedtime. 06/16/20   [name], [name], MD  SUMAtriptan (IMITREX) 50 MG tablet Take 1 tablet (50 mg total) by mouth once as needed for migraine. May repeat in 2 hours if headache  persists or recurs. 03/24/23 03/24/24  Cameron Ali, PA-C      Allergies    Penicillins and Sulfa antibiotics    Review of Systems   Review of Systems  Physical Exam Updated Vital Signs BP 128/89 (BP Location: Right Arm)   Pulse 80   Temp 98.2 F (36.8 C) (Oral)   Resp 16   Ht 5\' 11"  (1.803 m)   Wt 115.2 kg   SpO2 98%   BMI 35.43 kg/m  Physical Exam Constitutional:      Appearance: He is well-developed.  HENT:     Head: Normocephalic and atraumatic.  Eyes:     Conjunctiva/sclera: Conjunctivae normal.  Cardiovascular:     Rate and Rhythm: Normal rate.  Pulmonary:     Effort: Pulmonary  effort is normal. No respiratory distress.  Musculoskeletal:        General: Normal range of motion.     Cervical back: Normal range of motion.     Comments: Normal neck ROM. Normal sensation LUE. NVI LUE. Examination of the left shoulder shows no swelling warmth or redness.  No bruising.  Tender along the long head biceps tendon along the bicipital groove.  Patient has some tenderness along the deltoid.  Positive Hawkins and impingement test.  Active abduction to 90 degrees with moderate pain, passively we can get him a little higher but with increase in discomfort.  He has good internal and external rotation with no stiffness.  No laxity noted on shoulder range of motion exam.  Skin:    General: Skin is warm.     Capillary Refill: Capillary refill takes less than 2 seconds.     Findings: No rash.  Neurological:     General: No focal deficit present.     Mental Status: He is alert and oriented to person, place, and time.  Psychiatric:        Behavior: Behavior normal.        Thought Content: Thought content normal.     ED Results / Procedures / Treatments   Labs (all labs ordered are listed, but only abnormal results are displayed) Labs Reviewed - No data to display  EKG None  Radiology DG Shoulder Left  Result Date: 05/27/2023 CLINICAL DATA:  Four day history of left shoulder pain EXAM: LEFT SHOULDER - 2+ VIEW COMPARISON:  07/13/2022 FINDINGS: There is no evidence of fracture or dislocation. There is no evidence of arthropathy or other focal bone abnormality. Soft tissues are unremarkable. Left chest wall pacemaker. IMPRESSION: Negative. Electronically Signed   By: Minerva Fester M.D.   On: 05/27/2023 21:08    Procedures Procedures    Medications Ordered in ED Medications  ketorolac (TORADOL) 30 MG/ML injection 30 mg (30 mg Intramuscular Given 05/27/23 2120)    ED Course/ Medical Decision Making/ A&P                                 Medical Decision Making Amount  and/or Complexity of Data Reviewed Radiology: ordered.   37 year old male with left shoulder pain after lifting a mattress earlier this week.  Pain did not start till a few days later and was gradual in onset.  He has no neurological deficits.  Pain is focal to the shoulder and increased with impingement physical exam signs.  He also has some tenderness along the biceps tendon.  X-rays ordered and independently reviewed by me today show no evidence of  acute bony abnormality or abnormal bony lesions.  He has adequate glenohumeral joint space with adequate subacromial space with very mild subacromial spurring with no significant AC arthropathy.  Will place into a sling and have him rest and ice the shoulder.  He is given Toradol 30 mg IM today and started on a 6-day prednisone taper.  Patient unable to take NSAIDs due to gastric irritation.  Recommend follow-up with orthopedics in 1 week for recheck. Final Clinical Impression(s) / ED Diagnoses Final diagnoses:  Rotator cuff syndrome, left  Biceps tendinitis, left    Rx / DC Orders ED Discharge Orders          Ordered    predniSONE (DELTASONE) 10 MG tablet  Daily        05/27/23 2114              Ronnette Juniper 05/27/23 2122    Shaune Pollack, MD 06/01/23 1057

## 2023-05-27 NOTE — ED Triage Notes (Signed)
Pt presents with a 4 day hx of L shoulder pain that started after pulling a mattress through his yard.

## 2023-05-27 NOTE — Discharge Instructions (Signed)
Please take medications as prescribed.  You continue with Tylenol with the prednisone.  Use a sling as needed for comfort for 1 week.  Apply ice 20 minutes every hour as needed to the left shoulder.  Call orthopedic office on Monday to schedule follow-up appointment in 1 week for recheck.

## 2023-07-01 ENCOUNTER — Other Ambulatory Visit: Payer: Self-pay

## 2023-07-01 ENCOUNTER — Emergency Department
Admission: EM | Admit: 2023-07-01 | Discharge: 2023-07-01 | Disposition: A | Discharge status: Home / Self Care | Payer: MEDICAID | Attending: Emergency Medicine | Admitting: Emergency Medicine

## 2023-07-01 ENCOUNTER — Encounter: Payer: Self-pay | Admitting: Emergency Medicine

## 2023-07-01 DIAGNOSIS — R4589 Other symptoms and signs involving emotional state: Secondary | ICD-10-CM

## 2023-07-01 DIAGNOSIS — I10 Essential (primary) hypertension: Secondary | ICD-10-CM | POA: Insufficient documentation

## 2023-07-01 DIAGNOSIS — F314 Bipolar disorder, current episode depressed, severe, without psychotic features: Secondary | ICD-10-CM | POA: Diagnosis present

## 2023-07-01 DIAGNOSIS — R519 Headache, unspecified: Secondary | ICD-10-CM | POA: Insufficient documentation

## 2023-07-01 DIAGNOSIS — Z87891 Personal history of nicotine dependence: Secondary | ICD-10-CM | POA: Insufficient documentation

## 2023-07-01 LAB — CBC
HCT: 46.5 % (ref 39.0–52.0)
Hemoglobin: 15.9 g/dL (ref 13.0–17.0)
MCH: 30.1 pg (ref 26.0–34.0)
MCHC: 34.2 g/dL (ref 30.0–36.0)
MCV: 87.9 fL (ref 80.0–100.0)
Platelets: 269 10*3/uL (ref 150–400)
RBC: 5.29 MIL/uL (ref 4.22–5.81)
RDW: 12.7 % (ref 11.5–15.5)
WBC: 8.6 10*3/uL (ref 4.0–10.5)
nRBC: 0 % (ref 0.0–0.2)

## 2023-07-01 LAB — URINE DRUG SCREEN, QUALITATIVE (ARMC ONLY)
Amphetamines, Ur Screen: NOT DETECTED
Barbiturates, Ur Screen: NOT DETECTED
Benzodiazepine, Ur Scrn: POSITIVE — AB
Cannabinoid 50 Ng, Ur ~~LOC~~: POSITIVE — AB
Cocaine Metabolite,Ur ~~LOC~~: NOT DETECTED
MDMA (Ecstasy)Ur Screen: NOT DETECTED
Methadone Scn, Ur: NOT DETECTED
Opiate, Ur Screen: NOT DETECTED
Phencyclidine (PCP) Ur S: NOT DETECTED
Tricyclic, Ur Screen: POSITIVE — AB

## 2023-07-01 LAB — COMPREHENSIVE METABOLIC PANEL
ALT: 18 U/L (ref 0–44)
AST: 16 U/L (ref 15–41)
Albumin: 4.4 g/dL (ref 3.5–5.0)
Alkaline Phosphatase: 86 U/L (ref 38–126)
Anion gap: 8 (ref 5–15)
BUN: 25 mg/dL — ABNORMAL HIGH (ref 6–20)
CO2: 21 mmol/L — ABNORMAL LOW (ref 22–32)
Calcium: 8.6 mg/dL — ABNORMAL LOW (ref 8.9–10.3)
Chloride: 106 mmol/L (ref 98–111)
Creatinine, Ser: 1.08 mg/dL (ref 0.61–1.24)
GFR, Estimated: >60 mL/min (ref >60)
Glucose, Bld: 109 mg/dL — ABNORMAL HIGH (ref 70–99)
Potassium: 3.6 mmol/L (ref 3.5–5.1)
Sodium: 135 mmol/L (ref 135–145)
Total Bilirubin: 0.5 mg/dL (ref <1.2)
Total Protein: 7.6 g/dL (ref 6.5–8.1)

## 2023-07-01 LAB — SALICYLATE LEVEL: Salicylate Lvl: <7 mg/dL — ABNORMAL LOW (ref 7.0–30.0)

## 2023-07-01 LAB — ACETAMINOPHEN LEVEL: Acetaminophen (Tylenol), Serum: <10 ug/mL — ABNORMAL LOW (ref 10–30)

## 2023-07-01 LAB — ETHANOL: Alcohol, Ethyl (B): <10 mg/dL (ref <10)

## 2023-07-01 NOTE — Discharge Instructions (Signed)
You were seen in the emergency department today for evaluation of your depressed mood.  Please continue to follow-up as an outpatient for further evaluation.  Return to the ER for any new or worsening symptoms.

## 2023-07-01 NOTE — ED Triage Notes (Addendum)
Pt recently started taking nortriptyline.  States his depression has been getting worse.  Initially saw improvement in his symptoms but the past couple of days has had a worsening of his depression symptoms.  No SI or HI just feeling "down" and with less energy

## 2023-07-01 NOTE — ED Notes (Signed)
VOL  pending  consult 

## 2023-07-01 NOTE — ED Provider Notes (Signed)
[facility] Provider Note    Event Date/Time   First MD Initiated Contact with Patient 07/01/23 1649     (approximate)   History   Depression   HPI  Hyun Doni Kuechle is a 37 year old male presenting to the emergency department for evaluation of depressed mood.  History of bipolar and schizoaffective disorder, denies any psychotic symptoms.  He has had ongoing headaches that he has been using Imitrex for, but about 5 weeks ago was started on nortriptyline.  Initially had elevated mood, but over the past week and a half has had worsening depressed mood.  Is concerned that this is potentially related to his medications.  No SI or HI.     Physical Exam   Triage Vital Signs: ED Triage Vitals  Encounter Vitals Group     BP 07/01/23 1556 (!) 143/101     Systolic BP Percentile --      Diastolic BP Percentile --      Pulse Rate 07/01/23 1556 99     Resp 07/01/23 1556 16     Temp 07/01/23 1556 98.1 F (36.7 C)     Temp Source 07/01/23 1556 Oral     SpO2 07/01/23 1556 98 %     Weight --      Height --      Head Circumference --      Peak Flow --      Pain Score 07/01/23 1553 0     Pain Loc --      Pain Education --      Exclude from Growth Chart --     Most recent vital signs: Vitals:   07/01/23 1556  BP: (!) 143/101  Pulse: 99  Resp: 16  Temp: 98.1 F (36.7 C)  SpO2: 98%     General: Awake, interactive  CV:  Regular rate, good peripheral perfusion.  Resp:  Unlabored respirations.  Abd:  Nondistended.  Neuro:  Symmetric facial movement, fluid speech   ED Results / Procedures / Treatments   Labs (all labs ordered are listed, but only abnormal results are displayed) Labs Reviewed  COMPREHENSIVE METABOLIC PANEL - Abnormal; Notable for the following components:      Result Value   CO2 21 (*)    Glucose, Bld 109 (*)    BUN 25 (*)    Calcium 8.6 (*)    All other components within normal limits  SALICYLATE LEVEL - Abnormal; Notable for  the following components:   Salicylate Lvl <7.0 (*)    All other components within normal limits  ACETAMINOPHEN LEVEL - Abnormal; Notable for the following components:   Acetaminophen (Tylenol), Serum <10 (*)    All other components within normal limits  URINE DRUG SCREEN, QUALITATIVE ([facility] ONLY) - Abnormal; Notable for the following components:   Tricyclic, Ur Screen POSITIVE (*)    Cannabinoid 50 Ng, Ur [place] POSITIVE (*)    Benzodiazepine, Ur Scrn POSITIVE (*)    All other components within normal limits  ETHANOL  CBC     EKG EKG independently reviewed interpreted by myself (ER attending) demonstrates:    RADIOLOGY Imaging independently reviewed and interpreted by myself demonstrates:    PROCEDURES:  Critical Care performed: No  Procedures   MEDICATIONS ORDERED IN ED: Medications - No data to display   IMPRESSION / MDM / ASSESSMENT AND PLAN / ED COURSE  I reviewed the triage vital signs and the nursing notes.  Differential diagnosis includes, but is not limited to,  psychiatric disorder, medication associated substance-induced mood disorder, acute stress response  Patient's presentation is most consistent with acute presentation with potential threat to life or bodily function.  37 year old male presenting with worsening depressed mood.  Vital stable on presentation.  No SI, HI, evidence of AVH, no indication for IVC.  However, with worsening depressed mood, will consult psychiatry for further recommendations.  The patient has been placed in psychiatric observation due to the need to provide a safe environment for the patient while obtaining psychiatric consultation and evaluation, as well as ongoing medical and medication management to treat the patient's condition.  The patient has not been placed under full IVC at this time.  Notified by NP Lerry Liner that she had evaluated the patient and she did feel that patient could be cleared for discharge from a psychiatric  perspective.  Labs without critical derangements.  She did not recommend any medication adjustments currently.  Patient was reevaluated.  He is comfortable with discharge home and outpatient follow-up.  Strict return precautions provided.  Patient discharged in stable condition.     FINAL CLINICAL IMPRESSION(S) / ED DIAGNOSES   Final diagnoses:  Depressed mood     Rx / DC Orders   ED Discharge Orders     None        Note:  This document was prepared using Dragon voice recognition software and may include unintentional dictation errors.   Trinna Post, MD 07/01/23 2249

## 2023-07-01 NOTE — Consult Note (Signed)
Telepsych Consultation   Reason for Consult:  Psych evaluation  Referring Physician:  Dr. Rosalia [name] Location of Patient: [facility] Location of Provider: Behavioral Health TTS Department  Patient Identification: Corey [name] MRN:  [id number] Principal Diagnosis: Bipolar 1 disorder, depressed, severe (HCC) Diagnosis:  Principal Problem:   Bipolar 1 disorder, depressed, severe (HCC)   Total Time spent with patient: 30 minutes  Subjective:     HPI:  Tele psych Assessment   Corey [name], 37 y.o., male patient seen via tele health by TTS and this provider; chart reviewed and consulted with Corey [name] on 07/01/23.  On evaluation Corey [name] reports that the past week and half my depression has gotten out of control. He says the holidays has made his depression worst. [name]  He was seen a month ago. He takes an [place] injection which he says is due in 3 says.  Educated patient on possibly talking to provider about decreasing the time from Q28 DAYS to Q21 DAYS.  Patient states that in addition to the inj being due, there are also a lot of family situations going on in his life, such as his wife new disability, he not being able to see any of his sons, and losing his grandfather in January.  It has been recommended that in addition to med management,  patient request a therapist to talk to. He agreed and stated that he would follow-up.     Patient is speaking in a clear tone at moderate volume, and normal pace; with good eye contact.  His/Her thought process is coherent and relevant; There is no indication that he is currently responding to internal/external stimuli or experiencing delusional thought content.  Patient denies suicidal/self-harm/homicidal ideation, psychosis, and paranoia. Patient reports having no access to fire arms.  Patient denies AVH.   Patient has remained calm throughout assessment and has answered questions appropriately.       Recommendations:  Discharge to home with recommendation to follow-up with outside provider.     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    Past Surgical History:  Procedure Laterality Date   ESOPHAGOGASTRODUODENOSCOPY (EGD) WITH PROPOFOL N/A 04/26/2018   Procedure: ESOPHAGOGASTRODUODENOSCOPY (EGD) WITH PROPOFOL;  Surgeon: Wyline Mood, MD;  Location: [facility] ENDOSCOPY;  Service: Gastroenterology;  Laterality: N/A;   KNEE ARTHROSCOPY     PACEMAKER INSERTION Left 05/15/2018   Procedure: INSERTION PACEMAKER;  Surgeon: Marcina Millard, MD;  Location: [facility] ORS;  Service: Cardiovascular;  Laterality: Left;   Family History:  Family History  Problem Relation Age of Onset   Kidney cancer Neg Hx    Bladder Cancer Neg Hx    Prostate cancer Neg Hx    Family Psychiatric  History: unknown Social History:  Social History   Substance and Sexual Activity  Alcohol Use Yes   Comment: occ     Social History   Substance and Sexual Activity  Drug Use Yes   Types: Marijuana   Comment: occ    Social History   Socioeconomic  History   Marital status: Divorced    Spouse name: Not on file   Number of children: 3   Years of education: Not on file   Highest education level: Not on file  Occupational History   Not on file  Tobacco Use   Smoking status: Former   Smokeless tobacco: Former    Types: Snuff  Vaping Use   Vaping status: Every Day  Substance and Sexual Activity   Alcohol use: Yes    Comment: occ   Drug use: Yes    Types: Marijuana     Comment: occ   Sexual activity: Not Currently  Other Topics Concern   Not on file  Social History Narrative   Not on file   Social Determinants of Health   Financial Resource Strain: Medium Risk (05/10/2023)   Received from [facility]   Overall Financial Resource Strain (CARDIA)    Difficulty of Paying Living Expenses: Somewhat hard  Food Insecurity: Food Insecurity Present (05/10/2023)   Received from [facility]   Hunger Vital Sign    Worried About Running Out of Food in the Last Year: Often true    Ran Out of Food in the Last Year: Often true  Transportation Needs: No Transportation Needs (05/10/2023)   Received from [facility] - Transportation    In the past 12 months, has lack of transportation kept you from medical appointments or from getting medications?: No    Lack of Transportation (Non-Medical): No  Physical Activity: Sufficiently Active (03/26/2018)   Exercise Vital Sign    Days of Exercise per Week: 7 days    Minutes of Exercise per Session: 60 min  Stress: No Stress Concern Present (03/26/2018)   [employer] of Occupational Health - Occupational Stress Questionnaire    Feeling of Stress : Not at all  Social Connections: Moderately Isolated (03/26/2018)   Social Connection and Isolation Panel [NHANES]    Frequency of Communication with Friends and Family: More than three times a week    Frequency of Social Gatherings with Friends and Family: More than three times a week    Attends Religious Services: Never    Database administrator or Organizations: No    Attends Banker Meetings: Never    Marital Status: Separated   Additional Social History:    Allergies:   Allergies  Allergen Reactions   Penicillins Hives    Has patient had a PCN reaction causing immediate rash, facial/tongue/throat swelling, SOB or lightheadedness with hypotension: Yes Has patient had a PCN reaction causing  severe rash involving mucus membranes or skin necrosis: No Has patient had a PCN reaction that required hospitalization: No Has patient had a PCN reaction occurring within the last 10 years: No If all of the above answers are "NO", then may proceed with Cephalosporin use.    Sulfa Antibiotics Hives    Labs:  Results for orders placed or performed during the hospital encounter of 07/01/23 (from the past 48 hour(s))  Comprehensive metabolic panel     Status: Abnormal   Collection Time: 07/01/23  3:55 PM  Result Value Ref Range   Sodium 135 135 - 145 mmol/L   Potassium 3.6 3.5 - 5.1 mmol/L   Chloride 106 98 - 111 mmol/L   CO2 21 (L) 22 - 32 mmol/L   Glucose, Bld 109 (H) 70 - 99 mg/dL    Comment: Glucose reference range applies only to samples taken  after fasting for at least 8 hours.   BUN 25 (H) 6 - 20 mg/dL   Creatinine, Ser 1.30 0.61 - 1.24 mg/dL   Calcium 8.6 (L) 8.9 - 10.3 mg/dL   Total Protein 7.6 6.5 - 8.1 g/dL   Albumin 4.4 3.5 - 5.0 g/dL   AST 16 15 - 41 U/L   ALT 18 0 - 44 U/L   Alkaline Phosphatase 86 38 - 126 U/L   Total Bilirubin 0.5 <1.2 mg/dL   GFR, Estimated >86 >57 mL/min    Comment: (NOTE) Calculated using the CKD-EPI Creatinine Equation (2021)    Anion gap 8 5 - 15    Comment: Performed at [facility], [address], [place], [place] [address]  Ethanol     Status: None   Collection Time: 07/01/23  3:55 PM  Result Value Ref Range   Alcohol, Ethyl (B) <10 <10 mg/dL    Comment: (NOTE) Lowest detectable limit for serum alcohol is 10 mg/dL.  For medical purposes only. Performed at [facility], [address] [place], [place] [address]   Salicylate level     Status: Abnormal   Collection Time: 07/01/23  3:55 PM  Result Value Ref Range   Salicylate Lvl <7.0 (L) 7.0 - 30.0 mg/dL    Comment: Performed at [facility], [address] [place], [place] [address]  Acetaminophen level     Status: Abnormal   Collection Time:  07/01/23  3:55 PM  Result Value Ref Range   Acetaminophen (Tylenol), Serum <10 (L) 10 - 30 ug/mL    Comment: (NOTE) Therapeutic concentrations vary significantly. A range of 10-30 ug/mL  may be an effective concentration for many patients. However, some  are best treated at concentrations outside of this range. Acetaminophen concentrations >150 ug/mL at 4 hours after ingestion  and >50 ug/mL at 12 hours after ingestion are often associated with  toxic reactions.  Performed at [facility], [address] [place], [place] [address]   cbc     Status: None   Collection Time: 07/01/23  3:55 PM  Result Value Ref Range   WBC 8.6 4.0 - 10.5 K/uL   RBC 5.29 4.22 - 5.81 MIL/uL   Hemoglobin 15.9 13.0 - 17.0 g/dL   HCT 72.5 36.6 - 44.0 %   MCV 87.9 80.0 - 100.0 fL   MCH 30.1 26.0 - 34.0 pg   MCHC 34.2 30.0 - 36.0 g/dL   RDW 34.7 42.5 - 95.6 %   Platelets 269 150 - 400 K/uL   nRBC 0.0 0.0 - 0.2 %    Comment: Performed at [facility], [address] [place], [place] [address]  Urine Drug Screen, Qualitative     Status: Abnormal   Collection Time: 07/01/23  3:55 PM  Result Value Ref Range   Tricyclic, Ur Screen POSITIVE (A) NONE DETECTED   Amphetamines, Ur Screen NONE DETECTED NONE DETECTED   MDMA (Ecstasy)Ur Screen NONE DETECTED NONE DETECTED   Cocaine Metabolite,Ur [place] NONE DETECTED NONE DETECTED   Opiate, Ur Screen NONE DETECTED NONE DETECTED   Phencyclidine (PCP) Ur S NONE DETECTED NONE DETECTED   Cannabinoid 50 Ng, Ur [place] POSITIVE (A) NONE DETECTED   Barbiturates, Ur Screen NONE DETECTED NONE DETECTED   Benzodiazepine, Ur Scrn POSITIVE (A) NONE DETECTED   Methadone Scn, Ur NONE DETECTED NONE DETECTED    Comment: (NOTE) Tricyclics + metabolites, urine    Cutoff 1000 ng/mL Amphetamines + metabolites, urine  Cutoff 1000  ng/mL MDMA (Ecstasy), urine              Cutoff 500 ng/mL Cocaine Metabolite, urine          Cutoff 300 ng/mL Opiate + metabolites, urine         Cutoff 300 ng/mL Phencyclidine (PCP), urine         Cutoff 25 ng/mL Cannabinoid, urine                 Cutoff 50 ng/mL Barbiturates + metabolites, urine  Cutoff 200 ng/mL Benzodiazepine, urine              Cutoff 200 ng/mL Methadone, urine                   Cutoff 300 ng/mL  The urine drug screen provides only a preliminary, unconfirmed analytical test result and should not be used for non-medical purposes. Clinical consideration and professional judgment should be applied to any positive drug screen result due to possible interfering substances. A more specific alternate chemical method must be used in order to obtain a confirmed analytical result. Gas chromatography / mass spectrometry (GC/MS) is the preferred confirm atory method. Performed at [facility], [address] [place], [place] [address]     Medications:  No current facility-administered medications for this encounter.   Current Outpatient Medications  Medication Sig Dispense Refill   Brexpiprazole (REXULTI) 3 MG TABS Take 3 mg by mouth at bedtime.     clonazePAM (KLONOPIN) 1 MG tablet Take 1 mg by mouth 2 (two) times daily as needed for anxiety.     dicyclomine (BENTYL) 10 MG capsule Take 1 capsule (10 mg total) by mouth 3 (three) times daily as needed for up to 5 days (abdominal cramping, diarrhea). 15 capsule 0   divalproex (DEPAKOTE ER) 500 MG 24 hr tablet Take 2 tablets (1,000 mg total) by mouth at bedtime. 60 tablet 1   Doxepin HCl 6 MG TABS Take 6 mg by mouth Nightly.     Eszopiclone (ESZOPICLONE) 3 MG TABS Take 3 mg by mouth at bedtime. Take immediately before bedtime     FLUoxetine (PROZAC) 20 MG capsule Take 1 capsule (20 mg total) by mouth daily. 30 capsule 1   HYDROcodone-acetaminophen (NORCO) 5-325 MG tablet Take 1 tablet by mouth every 6 (six) hours as needed for moderate pain or severe pain. 20 tablet 0   hydrOXYzine (VISTARIL) 25 MG capsule Take 25-50 mg by mouth 4 (four) times daily as  needed.     nitroGLYCERIN (NITROSTAT) 0.4 MG SL tablet Place 1 tablet (0.4 mg total) under the tongue every 5 (five) minutes as needed for chest pain. 10 tablet 0   ondansetron (ZOFRAN-ODT) 8 MG disintegrating tablet Take 1 tablet (8 mg total) by mouth every 8 (eight) hours as needed for nausea or vomiting. 20 tablet 0   pantoprazole (PROTONIX) 40 MG tablet Take 1 tablet (40 mg total) by mouth daily. 30 tablet 1   predniSONE (DELTASONE) 10 MG tablet Take 1 tablet (10 mg total) by mouth daily. 6,5,4,3,2,1 six day taper 21 tablet 0   prochlorperazine (COMPAZINE) 10 MG tablet Take 1 tablet (10 mg total) by mouth every 6 (six) hours as needed for nausea or vomiting. 30 tablet 0   QUEtiapine (SEROQUEL) 100 MG tablet Take 100 mg by mouth Nightly.     risperiDONE (RISPERDAL) 2 MG tablet Take 2 tablets (4 mg total) by mouth at bedtime. 60 tablet 1   SUMAtriptan (IMITREX)  50 MG tablet Take 1 tablet (50 mg total) by mouth once as needed for migraine. May repeat in 2 hours if headache persists or recurs. 30 tablet 2    Musculoskeletal: Strength & Muscle Tone: within normal limits Gait & Station: normal Patient leans: N/A  Psychiatric Specialty Exam:  Presentation  General Appearance: Appropriate for Environment  Eye Contact:Fair  Speech:Clear and Coherent  Speech Volume:Normal  Handedness:Right   Mood and Affect  Mood:Depressed  Affect:Appropriate; Congruent   Thought Process  Thought Processes:Coherent  Descriptions of Associations:Intact  Orientation:Full (Time, Place and Person)  Thought Content:Logical; WDL  History of Schizophrenia/Schizoaffective disorder:No data recorded Duration of Psychotic Symptoms:No data recorded Hallucinations:Hallucinations: None  Ideas of Reference:None  Suicidal Thoughts:Suicidal Thoughts: No  Homicidal Thoughts:Homicidal Thoughts: No   Sensorium  Memory:Recent Good; Remote Good  Judgment:Intact  Insight:Good   Executive Functions   Concentration:Good  Attention Span:Good  Recall:Good  Fund of Knowledge:Good  Language:Good   Psychomotor Activity  Psychomotor Activity:Psychomotor Activity: Normal   Assets  Assets:Communication Skills; Desire for Improvement; Financial Resources/Insurance; Housing; Intimacy; Leisure Time; Resilience; Social Support; Talents/Skills   Sleep  Sleep:Sleep: Fair    Physical Exam: Physical Exam Vitals and nursing note reviewed.  Constitutional:      Appearance: Normal appearance.  HENT:     Head: Normocephalic and atraumatic.     Nose: Nose normal.     Mouth/Throat:     Mouth: Mucous membranes are dry.  Eyes:     Pupils: Pupils are equal, round, and reactive to light.  Pulmonary:     Effort: Pulmonary effort is normal.  Musculoskeletal:        General: Normal range of motion.     Cervical back: Normal range of motion.  Skin:    General: Skin is dry.  Neurological:     General: No focal deficit present.     Mental Status: He is alert and oriented to person, place, and time.  Psychiatric:        Attention and Perception: Attention and perception normal.        Mood and Affect: Affect is flat.        Speech: Speech normal.        Behavior: Behavior normal. Behavior is cooperative.        Thought Content: Thought content normal. Thought content is not paranoid or delusional. Thought content does not include homicidal or suicidal ideation. Thought content does not include homicidal or suicidal plan.        Cognition and Memory: Cognition and memory normal.        Judgment: Judgment normal.    Review of Systems  Psychiatric/Behavioral:  Positive for depression. Negative for hallucinations, memory loss, substance abuse and suicidal ideas. The patient is not nervous/anxious and does not have insomnia.   All other systems reviewed and are negative.  Blood pressure (!) 137/91, pulse 89, temperature 98.3 F (36.8 C), temperature source Oral, resp. rate 17, SpO2 99%.  There is no height or weight on file to calculate BMI.   Disposition: No evidence of imminent risk to self or others at present.   Patient does not meet criteria for psychiatric inpatient admission. Discussed crisis plan, support from social network, calling 911, coming to the Emergency Department, and calling Suicide Hotline.  This service was provided via telemedicine using a 2-way, interactive audio and video technology.   Jearld Lesch, NP 07/01/2023 11:22 PM

## 2023-08-17 ENCOUNTER — Other Ambulatory Visit: Payer: Self-pay | Admitting: Medical Genetics

## 2023-08-27 ENCOUNTER — Other Ambulatory Visit: Payer: Self-pay

## 2023-08-27 ENCOUNTER — Emergency Department
Admission: EM | Admit: 2023-08-27 | Discharge: 2023-08-27 | Disposition: A | Discharge status: Home / Self Care | Payer: MEDICAID | Attending: Emergency Medicine | Admitting: Emergency Medicine

## 2023-08-27 DIAGNOSIS — R197 Diarrhea, unspecified: Secondary | ICD-10-CM | POA: Insufficient documentation

## 2023-08-27 DIAGNOSIS — R42 Dizziness and giddiness: Secondary | ICD-10-CM | POA: Insufficient documentation

## 2023-08-27 DIAGNOSIS — R112 Nausea with vomiting, unspecified: Secondary | ICD-10-CM | POA: Insufficient documentation

## 2023-08-27 DIAGNOSIS — I10 Essential (primary) hypertension: Secondary | ICD-10-CM | POA: Insufficient documentation

## 2023-08-27 DIAGNOSIS — Z20822 Contact with and (suspected) exposure to covid-19: Secondary | ICD-10-CM | POA: Insufficient documentation

## 2023-08-27 LAB — CBC WITH DIFFERENTIAL/PLATELET
Abs Immature Granulocytes: 0.04 10*3/uL (ref 0.00–0.07)
Basophils Absolute: 0 10*3/uL (ref 0.0–0.1)
Basophils Relative: 1 %
Eosinophils Absolute: 0.4 10*3/uL (ref 0.0–0.5)
Eosinophils Relative: 5 %
HCT: 44.2 % (ref 39.0–52.0)
Hemoglobin: 14.8 g/dL (ref 13.0–17.0)
Immature Granulocytes: 1 %
Lymphocytes Relative: 18 %
Lymphs Abs: 1.5 10*3/uL (ref 0.7–4.0)
MCH: 29.8 pg (ref 26.0–34.0)
MCHC: 33.5 g/dL (ref 30.0–36.0)
MCV: 88.9 fL (ref 80.0–100.0)
Monocytes Absolute: 0.4 10*3/uL (ref 0.1–1.0)
Monocytes Relative: 5 %
Neutro Abs: 5.7 10*3/uL (ref 1.7–7.7)
Neutrophils Relative %: 70 %
Platelets: 232 10*3/uL (ref 150–400)
RBC: 4.97 MIL/uL (ref 4.22–5.81)
RDW: 12.6 % (ref 11.5–15.5)
WBC: 8.1 10*3/uL (ref 4.0–10.5)
nRBC: 0 % (ref 0.0–0.2)

## 2023-08-27 LAB — COMPREHENSIVE METABOLIC PANEL
ALT: 15 U/L (ref 0–44)
AST: 12 U/L — ABNORMAL LOW (ref 15–41)
Albumin: 4.1 g/dL (ref 3.5–5.0)
Alkaline Phosphatase: 63 U/L (ref 38–126)
Anion gap: 8 (ref 5–15)
BUN: 14 mg/dL (ref 6–20)
CO2: 25 mmol/L (ref 22–32)
Calcium: 8.6 mg/dL — ABNORMAL LOW (ref 8.9–10.3)
Chloride: 109 mmol/L (ref 98–111)
Creatinine, Ser: 1.01 mg/dL (ref 0.61–1.24)
GFR, Estimated: >60 mL/min (ref >60)
Glucose, Bld: 93 mg/dL (ref 70–99)
Potassium: 4.5 mmol/L (ref 3.5–5.1)
Sodium: 142 mmol/L (ref 135–145)
Total Bilirubin: 1 mg/dL (ref 0.0–1.2)
Total Protein: 7.1 g/dL (ref 6.5–8.1)

## 2023-08-27 LAB — TROPONIN I (HIGH SENSITIVITY)
Troponin I (High Sensitivity): <2 ng/L (ref <18)
Troponin I (High Sensitivity): <2 ng/L (ref <18)

## 2023-08-27 LAB — RESP PANEL BY RT-PCR (RSV, FLU A&B, COVID)  RVPGX2
Influenza A by PCR: NEGATIVE
Influenza B by PCR: NEGATIVE
Resp Syncytial Virus by PCR: NEGATIVE
SARS Coronavirus 2 by RT PCR: NEGATIVE

## 2023-08-27 LAB — LIPASE, BLOOD: Lipase: 31 U/L (ref 11–51)

## 2023-08-27 MED ORDER — ONDANSETRON 8 MG PO TBDP: 8.0000 mg | ORAL_TABLET | Freq: Three times a day (TID) | ORAL | 0 refills | Status: AC | PRN

## 2023-08-27 NOTE — ED Provider Notes (Signed)
[facility] Provider Note    Event Date/Time   First MD Initiated Contact with Patient 08/27/23 1722     (approximate)   History   No chief complaint on file.   HPI  Amoni Kimberley Dastrup is a 38 y.o. male sick sinus syndrome status post pacemaker, history of schizophrenia, bipolar, hypertension, presenting with some dizziness/lightheadedness.  States that has been worse when he stands up.  Has had nausea vomiting and diarrhea for the last week.  He denies any abdominal pain or fever, no chest pain or shortness of breath.  States that has been taking his medications as prescribed.  No leg swelling that is new.  On independent chart review he was last seen by his primary care doctor in December for right foot pain setting of history of gout.   Physical Exam   Triage Vital Signs: ED Triage Vitals [08/27/23 1406]  Encounter Vitals Group     BP 126/79     Systolic BP Percentile      Diastolic BP Percentile      Pulse Rate 72     Resp 20     Temp 98.9 F (37.2 C)     Temp Source Oral     SpO2 96 %     Weight 245 lb (111.1 kg)     Height 5\' 11"  (1.803 m)     Head Circumference      Peak Flow      Pain Score 0     Pain Loc      Pain Education      Exclude from Growth Chart     Most recent vital signs: Vitals:   08/27/23 1955 08/27/23 2030  BP:  106/69  Pulse:  74  Resp:  (!) 24  Temp: 97.8 F (36.6 C)   SpO2:  97%     General: Awake, no distress.  CV:  Good peripheral perfusion.  Resp:  Normal effort.  Clear to auscultation Abd:  No distention.  Soft nontender Other:  No cranial nerve deficits, no focal weakness or numbness, no dysmetria, he is able to get up and walk with steady ambulation   ED Results / Procedures / Treatments   Labs (all labs ordered are listed, but only abnormal results are displayed) Labs Reviewed  COMPREHENSIVE METABOLIC PANEL - Abnormal; Notable for the following components:      Result Value   Calcium 8.6  (*)    AST 12 (*)    All other components within normal limits  RESP PANEL BY RT-PCR (RSV, FLU A&B, COVID)  RVPGX2  LIPASE, BLOOD  CBC WITH DIFFERENTIAL/PLATELET  TROPONIN I (HIGH SENSITIVITY)  TROPONIN I (HIGH SENSITIVITY)     EKG  Sinus rhythm, rate 67, normal QRS, normal QTc, T wave flattening in 3, T wave inversion to V3, no ischemic ST elevation, not significant change compared to prior   PROCEDURES:  Critical Care performed: No  Procedures   MEDICATIONS ORDERED IN ED: Medications - No data to display   IMPRESSION / MDM / ASSESSMENT AND PLAN / ED COURSE  I reviewed the triage vital signs and the nursing notes.                              Differential diagnosis includes, but is not limited to, gastroenteritis, norovirus, viral illness, dehydration, electrolyte derangements, suspect near syncope or lightheadedness in the setting of nausea vomiting and diarrhea  for the past week.  No ataxia, dysmetria or focal neurodeficits to suggest stroke at this time.  Will get labs, respiratory viral panel, EKG, will interrogate his pacemaker.  Patient is not complaining about nausea at this time, will orally hydrate him.  Patient's presentation is most consistent with acute presentation with potential threat to life or bodily function.  Independent review of labs, troponin is negative, lipase is normal, electrolytes not severely deranged, creatinine is normal, respiratory viral panel is negative, no leukocytosis.  Patient is feeling a lot better on reassessment, ambulatory with steady gait.  [employer] pacemaker was interrogated, no obvious acute events noted.  Considered but no indication for further workup inpatient mission this time, he is safe for outpatient management.  Will discharge with short prescription of Zofran.  Instructed to follow-up with primary care doctor in 3 to 4 days for reassessment.  Strict precautions given.  Discharge.      FINAL CLINICAL IMPRESSION(S) / ED  DIAGNOSES   Final diagnoses:  Lightheadedness  Nausea vomiting and diarrhea     Rx / DC Orders   ED Discharge Orders          Ordered    ondansetron (ZOFRAN-ODT) 8 MG disintegrating tablet  Every 8 hours PRN        08/27/23 2133             Note:  This document was prepared using Dragon voice recognition software and may include unintentional dictation errors.    Claybon Jabs, MD 08/27/23 [phone]

## 2023-08-27 NOTE — ED Provider Triage Note (Signed)
Emergency Medicine Provider Triage Evaluation Note  Orva Gwaltney , a 38 y.o. male  was evaluated in triage.  Pt complains of SSS s/p pacemaker who presents with dizziness since 9am. Described as lightheadedness. Had n/v/d last week, improved today. Lightheadedness worse with standing, improved with sitting. No abd pain or fever.  Review of Systems  Positive: N/v/d, dizzy Negative: CP/fever  Physical Exam  There were no vitals taken for this visit. Gen:   Awake, no distress   Resp:  Normal effort  MSK:   Moves extremities without difficulty  Other:    Medical Decision Making  Medically screening exam initiated at 2:05 PM.  Appropriate orders placed.  Milledge Rulon Sera was informed that the remainder of the evaluation will be completed by another provider, this initial triage assessment does not replace that evaluation, and the importance of remaining in the ED until their evaluation is complete.     Jackelyn Hoehn, PA-C 08/27/23 1408

## 2023-08-27 NOTE — ED Triage Notes (Signed)
Pt reports every time he gets  up he gets dizzy. Pt reports he has a pacemaker due to sick sinus syndrome. Pt reports he has been sick last week, with N/V/diarrhea. Pt reports he was fine yesterday and this morning he developed this symptom. Pt reports dizziness as "room spinning" Pt talks in complete sentences no respiratory distress noted

## 2023-08-27 NOTE — Discharge Instructions (Addendum)
Please try to keep yourself hydrated.  You can take the Zofran every 8 hours as needed for the nausea.  Please follow-up with your primary care doctor for your symptoms.  Please return if you have persistent or worsening symptoms, you pass out, if you have chest pain or shortness of breath or if you have any additional concerns.

## 2023-08-27 NOTE — ED Notes (Signed)
Report given to [facility] RN on main side of the ED.

## 2023-08-27 NOTE — ED Notes (Signed)
Patient reports being dizzy while ambulating to and from hallway bathroom. Patient denies blurry vision at this time, but states it is intermittent. Patient denies pain. No shortness of breath noted. Skin is pale and dry. Right radial pulse is moderate.

## 2023-08-27 NOTE — ED Notes (Signed)
Patient ambulated to hallway bathroom per wife's report.

## 2023-08-27 NOTE — ED Notes (Signed)
Pt resting on stretcher, states improvement of symptoms, pending medtronic report.

## 2023-12-27 ENCOUNTER — Encounter: Payer: Self-pay | Admitting: Gastroenterology

## 2024-01-19 ENCOUNTER — Ambulatory Visit: Payer: MEDICAID | Admitting: Anesthesiology

## 2024-01-19 ENCOUNTER — Other Ambulatory Visit: Payer: Self-pay

## 2024-01-19 ENCOUNTER — Ambulatory Visit
Admission: RE | Admit: 2024-01-19 | Discharge: 2024-01-19 | Disposition: A | Discharge status: Home / Self Care | Payer: MEDICAID | Attending: Gastroenterology | Admitting: Gastroenterology

## 2024-01-19 ENCOUNTER — Encounter: Payer: Self-pay | Admitting: Gastroenterology

## 2024-01-19 ENCOUNTER — Encounter
Admission: RE | Disposition: A | Discharge status: Home / Self Care | Payer: Self-pay | Source: Home / Self Care | Attending: Gastroenterology

## 2024-01-19 DIAGNOSIS — I495 Sick sinus syndrome: Secondary | ICD-10-CM | POA: Insufficient documentation

## 2024-01-19 DIAGNOSIS — K31A11 Gastric intestinal metaplasia without dysplasia, involving the antrum: Secondary | ICD-10-CM | POA: Diagnosis not present

## 2024-01-19 DIAGNOSIS — Z87891 Personal history of nicotine dependence: Secondary | ICD-10-CM | POA: Diagnosis not present

## 2024-01-19 DIAGNOSIS — Z6834 Body mass index (BMI) 34.0-34.9, adult: Secondary | ICD-10-CM | POA: Diagnosis not present

## 2024-01-19 DIAGNOSIS — I10 Essential (primary) hypertension: Secondary | ICD-10-CM | POA: Diagnosis not present

## 2024-01-19 DIAGNOSIS — D128 Benign neoplasm of rectum: Secondary | ICD-10-CM | POA: Insufficient documentation

## 2024-01-19 DIAGNOSIS — K2980 Duodenitis without bleeding: Secondary | ICD-10-CM | POA: Diagnosis not present

## 2024-01-19 DIAGNOSIS — E669 Obesity, unspecified: Secondary | ICD-10-CM | POA: Diagnosis not present

## 2024-01-19 DIAGNOSIS — Z79899 Other long term (current) drug therapy: Secondary | ICD-10-CM | POA: Insufficient documentation

## 2024-01-19 DIAGNOSIS — D125 Benign neoplasm of sigmoid colon: Secondary | ICD-10-CM | POA: Diagnosis not present

## 2024-01-19 DIAGNOSIS — K219 Gastro-esophageal reflux disease without esophagitis: Secondary | ICD-10-CM | POA: Diagnosis not present

## 2024-01-19 DIAGNOSIS — K760 Fatty (change of) liver, not elsewhere classified: Secondary | ICD-10-CM | POA: Insufficient documentation

## 2024-01-19 DIAGNOSIS — K529 Noninfective gastroenteritis and colitis, unspecified: Secondary | ICD-10-CM | POA: Diagnosis present

## 2024-01-19 DIAGNOSIS — F319 Bipolar disorder, unspecified: Secondary | ICD-10-CM | POA: Diagnosis not present

## 2024-01-19 DIAGNOSIS — R112 Nausea with vomiting, unspecified: Secondary | ICD-10-CM | POA: Insufficient documentation

## 2024-01-19 DIAGNOSIS — Z7952 Long term (current) use of systemic steroids: Secondary | ICD-10-CM | POA: Diagnosis not present

## 2024-01-19 DIAGNOSIS — Z950 Presence of cardiac pacemaker: Secondary | ICD-10-CM | POA: Insufficient documentation

## 2024-01-19 DIAGNOSIS — K2289 Other specified disease of esophagus: Secondary | ICD-10-CM | POA: Diagnosis not present

## 2024-01-19 DIAGNOSIS — K635 Polyp of colon: Secondary | ICD-10-CM | POA: Insufficient documentation

## 2024-01-19 HISTORY — PX: ESOPHAGOGASTRODUODENOSCOPY: SHX5428

## 2024-01-19 HISTORY — PX: POLYPECTOMY: SHX149

## 2024-01-19 HISTORY — PX: COLONOSCOPY: SHX5424

## 2024-01-19 SURGERY — COLONOSCOPY
Anesthesia: General

## 2024-01-19 MED ORDER — LIDOCAINE HCL (PF) 2 % IJ SOLN
INTRAMUSCULAR | Status: AC
Start: 2024-01-19 — End: 2024-01-19
  Filled 2024-01-19: qty 5

## 2024-01-19 MED ORDER — GLYCOPYRROLATE 0.2 MG/ML IJ SOLN
INTRAMUSCULAR | Status: AC
Start: 2024-01-19 — End: 2024-01-19
  Filled 2024-01-19: qty 1

## 2024-01-19 MED ORDER — GLYCOPYRROLATE 0.2 MG/ML IJ SOLN
INTRAMUSCULAR | Status: DC | PRN
Start: 2024-01-19 — End: 2024-01-19
  Administered 2024-01-19: .2 mg via INTRAVENOUS

## 2024-01-19 MED ORDER — DEXMEDETOMIDINE HCL IN NACL 80 MCG/20ML IV SOLN
INTRAVENOUS | Status: DC | PRN
Start: 2024-01-19 — End: 2024-01-19
  Administered 2024-01-19: 20 ug via INTRAVENOUS

## 2024-01-19 MED ORDER — PROPOFOL 10 MG/ML IV BOLUS
INTRAVENOUS | Status: DC | PRN
  Administered 2024-01-19: 20 mg via INTRAVENOUS
  Administered 2024-01-19: 50 mg via INTRAVENOUS
  Administered 2024-01-19: 30 mg via INTRAVENOUS
  Administered 2024-01-19: 50 mg via INTRAVENOUS

## 2024-01-19 MED ORDER — SODIUM CHLORIDE 0.9 % IV SOLN: INTRAVENOUS | Status: DC

## 2024-01-19 MED ORDER — LIDOCAINE HCL (CARDIAC) PF 100 MG/5ML IV SOSY
PREFILLED_SYRINGE | INTRAVENOUS | Status: DC | PRN
  Administered 2024-01-19: 100 mg via INTRAVENOUS

## 2024-01-19 MED ORDER — PROPOFOL 500 MG/50ML IV EMUL
INTRAVENOUS | Status: DC | PRN
  Administered 2024-01-19: 75 ug/kg/min via INTRAVENOUS

## 2024-01-19 NOTE — Transfer of Care (Signed)
 Immediate Anesthesia Transfer of Care Note  Patient: Corey [name]  Procedure(s) Performed: COLONOSCOPY EGD (ESOPHAGOGASTRODUODENOSCOPY) POLYPECTOMY, INTESTINE  Patient Location: PACU  Anesthesia Type:General  Level of Consciousness: sedated  Airway & Oxygen Therapy: Patient Spontanous Breathing  Post-op Assessment: Report given to RN and Post -op Vital signs reviewed and stable  Post vital signs: Reviewed and stable  Last Vitals:  Vitals Value Taken Time  BP 89/60 01/19/24 09:30  Temp    Pulse 65 01/19/24 09:30  Resp 13 01/19/24 09:30  SpO2 97 % 01/19/24 09:30  Vitals shown include unfiled device data.  Last Pain:  Vitals:   01/19/24 0929  TempSrc:   PainSc: 0-No pain         Complications: No notable events documented.

## 2024-01-19 NOTE — Op Note (Signed)
 [facility] Patient Name: Corey [name] Procedure Date: 01/19/2024 8:22 AM MRN: [id number] Account #: [id number] Date of Birth: 03/07/1986 Admit Type: Outpatient Age: 38 Room: [facility] ENDO ROOM 1 Gender: Male Note Status: Finalized Instrument Name: Colonscope [id number] Procedure:             Colonoscopy Indications:           Chronic diarrhea Providers:             Bridgett Camps, DO Referring MD:          Rosalynn Come. Claudius Cumins, MD (Referring MD) Medicines:             Monitored Anesthesia Care Complications:         No immediate complications. Estimated blood loss:                         Minimal. Procedure:             Pre-Anesthesia Assessment:                        - Prior to the procedure, a History and Physical was                         performed, and patient medications and allergies were                         reviewed. The patient is competent. The risks and                         benefits of the procedure and the sedation options and                         risks were discussed with the patient. All questions                         were answered and informed consent was obtained.                         Patient identification and proposed procedure were                         verified by the physician, the nurse, the anesthetist                         and the technician in the endoscopy suite. Mental                         Status Examination: alert and oriented. Airway                         Examination: normal oropharyngeal airway and neck                         mobility. Respiratory Examination: clear to                         auscultation. CV Examination: RRR, no murmurs, no S3  or S4. Prophylactic Antibiotics: The patient does not                         require prophylactic antibiotics. Prior                         Anticoagulants: The patient has taken no anticoagulant                         or  antiplatelet agents. ASA Grade Assessment: II - A                         patient with mild systemic disease. After reviewing                         the risks and benefits, the patient was deemed in                         satisfactory condition to undergo the procedure. The                         anesthesia plan was to use monitored anesthesia care                         (MAC). Immediately prior to administration of                         medications, the patient was re-assessed for adequacy                         to receive sedatives. The heart rate, respiratory                         rate, oxygen saturations, blood pressure, adequacy of                         pulmonary ventilation, and response to care were                         monitored throughout the procedure. The physical                         status of the patient was re-assessed after the                         procedure.                        After obtaining informed consent, the colonoscope was                         passed under direct vision. Throughout the procedure,                         the patient's blood pressure, pulse, and oxygen                         saturations were monitored continuously. The  Colonoscope was introduced through the anus and                         advanced to the the terminal ileum, with                         identification of the appendiceal orifice and IC                         valve. The colonoscopy was performed without                         difficulty. The patient tolerated the procedure well.                         The quality of the bowel preparation was evaluated                         using the BBPS [facility] Bowel Preparation Scale) with                         scores of: Right Colon = 3, Transverse Colon = 3 and                         Left Colon = 3 (entire mucosa seen well with no                         residual staining, small fragments of  stool or opaque                         liquid). The total BBPS score equals 9. The terminal                         ileum, ileocecal valve, appendiceal orifice, and                         rectum were photographed. Findings:      The terminal ileum appeared normal. Biopsies were taken with a cold       forceps for histology. Estimated blood loss was minimal.      A 1 to 2 mm polyp was found in the sigmoid colon. The polyp was sessile.       The polyp was removed with a jumbo cold forceps. Resection and retrieval       were complete. Estimated blood loss was minimal.      A 3 to 4 mm polyp was found in the rectum. The polyp was sessile. The       polyp was removed with a cold snare. Resection and retrieval were       complete. Estimated blood loss was minimal.      Normal mucosa was found in the entire colon. Biopsies for histology were       taken with a cold forceps from the right colon, left colon and       transverse colon for evaluation of microscopic colitis. Estimated blood       loss was minimal.      The exam was otherwise without abnormality on direct and retroflexion       views.  Impression:            - The examined portion of the ileum was normal.                         Biopsied.                        - One 1 to 2 mm polyp in the sigmoid colon, removed                         with a jumbo cold forceps. Resected and retrieved.                        - One 3 to 4 mm polyp in the rectum, removed with a                         cold snare. Resected and retrieved.                        - Normal mucosa in the entire examined colon. Biopsied.                        - The examination was otherwise normal on direct and                         retroflexion views. Recommendation:        - Patient has a contact number available for                         emergencies. The signs and symptoms of potential                         delayed complications were discussed with the patient.                          Return to normal activities tomorrow. Written                         discharge instructions were provided to the patient.                        - Discharge patient to home.                        - Resume previous diet.                        - Continue present medications.                        - No ibuprofen , naproxen , or other non-steroidal                         anti-inflammatory drugs for 5 days after polyp removal.                        - Await pathology results.                        -  Repeat colonoscopy for surveillance based on                         pathology results.                        - Return to referring physician as previously                         scheduled.                        - The findings and recommendations were discussed with                         the patient.                        - The findings and recommendations were discussed with                         the patient. Procedure Code(s):     --- Professional ---                        [phone], Colonoscopy, flexible; with removal of                         tumor(s), polyp(s), or other lesion(s) by snare                         technique                        [id number], 59, Colonoscopy, flexible; with biopsy, single                         or multiple Diagnosis Code(s):     --- Professional ---                        D12.5, Benign neoplasm of sigmoid colon                        D12.8, Benign neoplasm of rectum                        K52.9, Noninfective gastroenteritis and colitis,                         unspecified CPT copyright 2022 American Medical Association. All rights reserved. The codes documented in this report are preliminary and upon coder review may  be revised to meet current compliance requirements. Attending Participation:      I personally performed the entire procedure. Polo Brisk, DO Quintin Buckle DO, DO 01/19/2024 9:33:25 AM This report has been  signed electronically. Number of Addenda: 0 Note Initiated On: 01/19/2024 8:22 AM Scope Withdrawal Time: 0 hours 9 minutes 47 seconds  Total Procedure Duration: 0 hours 16 minutes 55 seconds  Estimated Blood Loss:  Estimated blood loss was minimal.      [facility]

## 2024-01-19 NOTE — Anesthesia Postprocedure Evaluation (Signed)
 Anesthesia Post Note  Patient: Corey [name]  Procedure(s) Performed: COLONOSCOPY EGD (ESOPHAGOGASTRODUODENOSCOPY) POLYPECTOMY, INTESTINE  Patient location during evaluation: PACU Anesthesia Type: General Level of consciousness: awake and alert Pain management: pain level controlled Vital Signs Assessment: post-procedure vital signs reviewed and stable Respiratory status: spontaneous breathing, nonlabored ventilation and respiratory function stable Cardiovascular status: blood pressure returned to baseline and stable Postop Assessment: no apparent nausea or vomiting Anesthetic complications: no   No notable events documented.   Last Vitals:  Vitals:   01/19/24 0939 01/19/24 0949  BP: 91/62 (!) 96/58  Pulse: 72 63  Resp: 19 16  Temp:    SpO2: 98% 97%    Last Pain:  Vitals:   01/19/24 0949  TempSrc:   PainSc: 0-No pain                 Baltazar Bonier

## 2024-01-19 NOTE — Op Note (Addendum)
 [facility] Patient Name: Corey [name] Procedure Date: 01/19/2024 8:23 AM MRN: [id number] Account #: [id number] Date of Birth: March 06, 1986 Admit Type: Outpatient Age: 38 Room: [facility] ENDO ROOM 1 Gender: Male Note Status: Finalized Instrument Name: Upper Endoscope [id number] Procedure:             Upper GI endoscopy Indications:           Diarrhea, Nausea with vomiting Providers:             Bridgett Camps, DO Referring MD:          Rosalynn Come. Claudius Cumins, MD (Referring MD) Medicines:             Monitored Anesthesia Care Complications:         No immediate complications. Estimated blood loss:                         Minimal. Procedure:             Pre-Anesthesia Assessment:                        - Prior to the procedure, a History and Physical was                         performed, and patient medications and allergies were                         reviewed. The patient is competent. The risks and                         benefits of the procedure and the sedation options and                         risks were discussed with the patient. All questions                         were answered and informed consent was obtained.                         Patient identification and proposed procedure were                         verified by the physician, the nurse, the anesthetist                         and the technician in the endoscopy suite. Mental                         Status Examination: alert and oriented. Airway                         Examination: normal oropharyngeal airway and neck                         mobility. Respiratory Examination: clear to                         auscultation. CV Examination: RRR, no murmurs, no S3  or S4. Prophylactic Antibiotics: The patient does not                         require prophylactic antibiotics. Prior                         Anticoagulants: The patient has taken no anticoagulant                          or antiplatelet agents. ASA Grade Assessment: II - A                         patient with mild systemic disease. After reviewing                         the risks and benefits, the patient was deemed in                         satisfactory condition to undergo the procedure. The                         anesthesia plan was to use monitored anesthesia care                         (MAC). Immediately prior to administration of                         medications, the patient was re-assessed for adequacy                         to receive sedatives. The heart rate, respiratory                         rate, oxygen saturations, blood pressure, adequacy of                         pulmonary ventilation, and response to care were                         monitored throughout the procedure. The physical                         status of the patient was re-assessed after the                         procedure.                        After obtaining informed consent, the endoscope was                         passed under direct vision. Throughout the procedure,                         the patient's blood pressure, pulse, and oxygen                         saturations were monitored continuously. The Endoscope  was introduced through the mouth, and advanced to the                         second part of duodenum. The upper GI endoscopy was                         accomplished without difficulty. The patient tolerated                         the procedure well. Findings:      The duodenal bulb, first portion of the duodenum and second portion of       the duodenum were normal. Biopsies for histology were taken with a cold       forceps for evaluation of celiac disease. Estimated blood loss was       minimal.      The entire examined stomach was normal. Biopsies were taken with a cold       forceps for Helicobacter pylori testing. Estimated blood loss was        minimal.      The Z-line was variable. Biopsies were taken with a cold forceps for       histology. Estimated blood loss was minimal.      Esophagogastric landmarks were identified: the gastroesophageal junction       was found at 40 cm from the incisors.      Normal mucosa was found in the entire esophagus. Biopsies were obtained       from the proximal with cold forceps for histology of suspected       eosinophilic esophagitis. Estimated blood loss was minimal.      The exam of the esophagus was otherwise normal. Impression:            - Normal duodenal bulb, first portion of the duodenum                         and second portion of the duodenum. Biopsied.                        - Normal stomach. Biopsied.                        - Z-line variable. Biopsied.                        - Esophagogastric landmarks identified.                        - Normal mucosa was found in the entire esophagus.                        - Biopsies were taken with a cold forceps for                         evaluation of eosinophilic esophagitis. Recommendation:        - Patient has a contact number available for                         emergencies. The signs and symptoms of potential  delayed complications were discussed with the patient.                         Return to normal activities tomorrow. Written                         discharge instructions were provided to the patient.                        - Discharge patient to home.                        - Resume previous diet.                        - Continue present medications.                        - will discuss starting acid suppressive medication.                        - Await pathology results.                        - Return to GI clinic as previously scheduled.                        - The findings and recommendations were discussed with                         the patient's family.                        - The findings  and recommendations were discussed with                         the patient. Procedure Code(s):     --- Professional ---                        [phone], Esophagogastroduodenoscopy, flexible,                         transoral; with biopsy, single or multiple Diagnosis Code(s):     --- Professional ---                        K22.89, Other specified disease of esophagus                        R19.7, Diarrhea, unspecified                        R11.2, Nausea with vomiting, unspecified CPT copyright 2022 American Medical Association. All rights reserved. The codes documented in this report are preliminary and upon coder review may  be revised to meet current compliance requirements. Attending Participation:      I personally performed the entire procedure. Polo Brisk, DO Quintin Buckle DO, DO 01/19/2024 9:05:41 AM This report has been signed electronically. Number of Addenda: 0 Note Initiated On: 01/19/2024 8:23 AM Estimated Blood Loss:  Estimated blood loss was minimal.      [facility]

## 2024-01-19 NOTE — H&P (Signed)
 Pre-Procedure H&P   Patient ID: Corey [name] is a 38 y.o. male.  Gastroenterology Provider: Quintin Buckle, DO  Referring Provider: Dr. Claudius Cumins PCP: Yehuda Helms, MD  Date: 01/19/2024  HPI Mr. Corey [name] is a 38 y.o. male who presents today for Esophagogastroduodenoscopy and Colonoscopy for Nausea vomiting diarrhea .  The symptoms have been ongoing for several years.  He has intentionally lost weight.  He is still actively using nicotine  products.  More recently has developed nocturnal awakenings and urgency  Hemoglobin 14.8 MCV 89 platelets 232,000 creatinine 0.9  Status post PPM for sick sinus syndrome Daily THC use  EGD in 2019 was normal  Sister with Crohn's disease mother with celiac disease father with cirrhosis  Recent CT only demonstrating fatty liver disease otherwise normal from GI perspective  H. 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 Parks Bollman    Past Surgical History:  Procedure Laterality Date   ESOPHAGOGASTRODUODENOSCOPY (EGD) WITH PROPOFOL  N/A 04/26/2018   Procedure: ESOPHAGOGASTRODUODENOSCOPY (EGD) WITH PROPOFOL ;  Surgeon: Luke Salaam, MD;  Location: [facility] ENDOSCOPY;  Service: Gastroenterology;  Laterality: N/A;   INSERT / REPLACE / REMOVE PACEMAKER     KNEE ARTHROSCOPY     PACEMAKER INSERTION Left 05/15/2018   Procedure: INSERTION PACEMAKER;  Surgeon: Percival Brace, MD;  Location: [facility] ORS;  Service: Cardiovascular;  Laterality: Left;    Family History Sister with Crohn's disease mother with celiac disease father with cirrhosis No other h/o GI disease or malignancy  Review of Systems  Constitutional:   Negative for activity change, appetite change, chills, diaphoresis, fatigue, fever and unexpected weight change.  HENT:  Negative for trouble swallowing and voice change.   Respiratory:  Negative for shortness of breath and wheezing.   Cardiovascular:  Negative for chest pain, palpitations and leg swelling.  Gastrointestinal:  Positive for diarrhea, nausea and vomiting. Negative for abdominal distention, abdominal pain, anal bleeding, blood in stool and constipation.  Musculoskeletal:  Negative for arthralgias and myalgias.  Skin:  Negative for color change and pallor.  Neurological:  Negative for dizziness, syncope and weakness.  Psychiatric/Behavioral:  Negative for confusion. The patient is not nervous/anxious.   All other systems reviewed and are negative.    Medications No current facility-administered medications on file prior to encounter.   Current Outpatient Medications on File Prior to Encounter  Medication Sig Dispense Refill   ARIPiprazole Lauroxil ER (ARISTADA) 882 MG/3.2ML prefilled syringe Inject 882 mg into the muscle every 30 (thirty) days.     LORazepam  ER (LOREEV XR ) 2 MG CS24 Take 2 mg by mouth daily.     ondansetron  (ZOFRAN -ODT) 8 MG disintegrating tablet Take 1 tablet (8 mg total) by mouth every 8 (eight) hours as needed for nausea or vomiting. 20 tablet 0   SUMAtriptan  (IMITREX ) 50 MG tablet Take 1 tablet (50 mg total) by mouth once as needed for migraine. May repeat in 2 hours if headache persists or recurs. 30 tablet 2   Brexpiprazole  (REXULTI ) 3 MG TABS Take 3 mg by mouth at bedtime. (Patient not taking: Reported on 01/19/2024)     clonazePAM  (KLONOPIN ) 1 MG tablet Take 1 mg by mouth 2 (two) times daily as  needed for anxiety. (Patient not taking: Reported on 01/19/2024)     dicyclomine  (BENTYL ) 10 MG capsule Take 1 capsule (10 mg total) by mouth 3 (three) times daily as needed for up to 5 days (abdominal cramping, diarrhea). 15 capsule 0   divalproex  (DEPAKOTE  ER) 500  MG 24 hr tablet Take 2 tablets (1,000 mg total) by mouth at bedtime. (Patient not taking: Reported on 01/19/2024) 60 tablet 1   Doxepin HCl 6 MG TABS Take 6 mg by mouth Nightly. (Patient not taking: Reported on 01/19/2024)     Eszopiclone (ESZOPICLONE) 3 MG TABS Take 3 mg by mouth at bedtime. Take immediately before bedtime (Patient not taking: Reported on 01/19/2024)     FLUoxetine  (PROZAC ) 20 MG capsule Take 1 capsule (20 mg total) by mouth daily. (Patient not taking: Reported on 01/19/2024) 30 capsule 1   HYDROcodone -acetaminophen  (NORCO) 5-325 MG tablet Take 1 tablet by mouth every 6 (six) hours as needed for moderate pain or severe pain. (Patient not taking: Reported on 01/19/2024) 20 tablet 0   hydrOXYzine  (VISTARIL ) 25 MG capsule Take 25-50 mg by mouth 4 (four) times daily as needed.     nitroGLYCERIN  (NITROSTAT ) 0.4 MG SL tablet Place 1 tablet (0.4 mg total) under the tongue every 5 (five) minutes as needed for chest pain. 10 tablet 0   pantoprazole  (PROTONIX ) 40 MG tablet Take 1 tablet (40 mg total) by mouth daily. 30 tablet 1   predniSONE  (DELTASONE ) 10 MG tablet Take 1 tablet (10 mg total) by mouth daily. 6,5,4,3,2,1 six day taper (Patient not taking: Reported on 01/19/2024) 21 tablet 0   prochlorperazine  (COMPAZINE ) 10 MG tablet Take 1 tablet (10 mg total) by mouth every 6 (six) hours as needed for nausea or vomiting. (Patient not taking: Reported on 01/19/2024) 30 tablet 0   QUEtiapine (SEROQUEL) 100 MG tablet Take 100 mg by mouth Nightly. (Patient not taking: Reported on 01/19/2024)     risperiDONE  (RISPERDAL ) 2 MG tablet Take 2 tablets (4 mg total) by mouth at bedtime. 60 tablet 1    Pertinent medications related to GI and procedure were reviewed by me with the patient prior to the procedure   Current Facility-Administered Medications:    0.9 %  sodium chloride  infusion, , Intravenous, Continuous, Quintin Buckle, DO  sodium chloride          Allergies  Allergen Reactions    Penicillins Hives    Has patient had a PCN reaction causing immediate rash, facial/tongue/throat swelling, SOB or lightheadedness with hypotension: Yes Has patient had a PCN reaction causing severe rash involving mucus membranes or skin necrosis: No Has patient had a PCN reaction that required hospitalization: No Has patient had a PCN reaction occurring within the last 10 years: No If all of the above answers are NO, then may proceed with Cephalosporin use.    Sulfa Antibiotics Hives   Allergies were reviewed by me prior to the procedure  Objective   Body mass index is 34.17 kg/m. Vitals:   01/19/24 0844  BP: (!) 119/90  Pulse: 68  Resp: 18  Temp: (!) 96.4 F (35.8 C)  TempSrc: Temporal  SpO2: 97%  Weight: 111.1 kg  Height: 5' 11 (1.803 m)     Physical Exam Vitals and nursing note reviewed.  Constitutional:      General: He is not in acute distress.    Appearance: Normal appearance. He is obese. He is not ill-appearing, toxic-appearing or diaphoretic.  HENT:     Head: Normocephalic and  atraumatic.     Nose: Nose normal.     Mouth/Throat:     Mouth: Mucous membranes are moist.     Pharynx: Oropharynx is clear.   Eyes:     General: No scleral icterus.    Extraocular Movements: Extraocular movements intact.    Cardiovascular:     Rate and Rhythm: Normal rate and regular rhythm.     Heart sounds: Normal heart sounds. No murmur heard.    No friction rub. No gallop.  Pulmonary:     Effort: Pulmonary effort is normal. No respiratory distress.     Breath sounds: Normal breath sounds. No wheezing, rhonchi or rales.  Abdominal:     General: Bowel sounds are normal. There is no distension.     Palpations: Abdomen is soft.     Tenderness: There is no abdominal tenderness. There is no guarding or rebound.   Musculoskeletal:     Cervical back: Neck supple.     Right lower leg: No edema.     Left lower leg: No edema.   Skin:    General: Skin is warm and dry.      Coloration: Skin is not jaundiced or pale.   Neurological:     General: No focal deficit present.     Mental Status: He is alert and oriented to person, place, and time. Mental status is at baseline.   Psychiatric:        Mood and Affect: Mood normal.        Behavior: Behavior normal.        Thought Content: Thought content normal.        Judgment: Judgment normal.      Assessment:  Mr. Corey [name] is a 38 y.o. male  who presents today for Esophagogastroduodenoscopy and Colonoscopy for Nausea vomiting diarrhea .  Plan:  Esophagogastroduodenoscopy and Colonoscopy with possible intervention today  Esophagogastroduodenoscopy and Colonoscopy with possible biopsy, control of bleeding, polypectomy, and interventions as necessary has been discussed with the patient/patient representative. Informed consent was obtained from the patient/patient representative after explaining the indication, nature, and risks of the procedure including but not limited to death, bleeding, perforation, missed neoplasm/lesions, cardiorespiratory compromise, and reaction to medications. Opportunity for questions was given and appropriate answers were provided. Patient/patient representative has verbalized understanding is amenable to undergoing the procedure.   Quintin Buckle, DO  [facility] Gastroenterology  Portions of the record may have been created with voice recognition software. Occasional wrong-word or 'sound-a-like' substitutions may have occurred due to the inherent limitations of voice recognition software.  Read the chart carefully and recognize, using context, where substitutions may have occurred.

## 2024-01-19 NOTE — Anesthesia Preprocedure Evaluation (Addendum)
 Anesthesia Evaluation  Patient identified by MRN, date of birth, ID band Patient awake    Reviewed: Allergy & Precautions, H&P , NPO status , Patient's Chart, lab work & pertinent test results  Airway Mallampati: II  TM Distance: >3 FB Neck ROM: full    Dental no notable dental hx.    Pulmonary former smoker   Pulmonary exam normal        Cardiovascular hypertension, Normal cardiovascular exam+ dysrhythmias (SSS) + pacemaker (2019)      Neuro/Psych  PSYCHIATRIC DISORDERS   Bipolar Disorder   schizoaffective disordernegative neurological ROS     GI/Hepatic ,GERD  ,,(+)     substance abuse  marijuana use  Endo/Other  negative endocrine ROS    Renal/GU negative Renal ROS  negative genitourinary   Musculoskeletal   Abdominal  (+) + obese  Peds  Hematology negative hematology ROS (+)   Anesthesia Other Findings Past Medical History: No date: Bipolar 1 disorder (HCC) No date: GERD (gastroesophageal reflux disease) No date: HTN (hypertension) No date: Schizoaffective disorder (HCC) 05/15/2018: Symptomatic bradycardia     Comment:  pacemaker installed by Dr. Parks Bollman  Past Surgical History: 04/26/2018: ESOPHAGOGASTRODUODENOSCOPY (EGD) WITH PROPOFOL ; N/A     Comment:  Procedure: ESOPHAGOGASTRODUODENOSCOPY (EGD) WITH               PROPOFOL ;  Surgeon: Luke Salaam, MD;  Location: [facility]               ENDOSCOPY;  Service: Gastroenterology;  Laterality: N/A; No date: INSERT / REPLACE / REMOVE PACEMAKER No date: KNEE ARTHROSCOPY 05/15/2018: PACEMAKER INSERTION; Left     Comment:  Procedure: INSERTION PACEMAKER;  Surgeon: Percival Brace, MD;  Location: [facility] ORS;  Service:               Cardiovascular;  Laterality: Left;     Reproductive/Obstetrics negative OB ROS                             Anesthesia Physical Anesthesia Plan  ASA: 2  Anesthesia Plan: General    Post-op Pain Management:    Induction: Intravenous  PONV Risk Score and Plan: Propofol  infusion and TIVA  Airway Management Planned: Natural Airway  Additional Equipment:   Intra-op Plan:   Post-operative Plan:   Informed Consent: I have reviewed the patients History and Physical, chart, labs and discussed the procedure including the risks, benefits and alternatives for the proposed anesthesia with the patient or authorized representative who has indicated his/her understanding and acceptance.     Dental Advisory Given  Plan Discussed with: CRNA and Surgeon  Anesthesia Plan Comments:         Anesthesia Quick Evaluation

## 2024-01-19 NOTE — Interval H&P Note (Signed)
 History and Physical Interval Note: Preprocedure H&P from 01/19/24  was reviewed and there was no interval change after seeing and examining the patient.  Written consent was obtained from the patient after discussion of risks, benefits, and alternatives. Patient has consented to proceed with Esophagogastroduodenoscopy and Colonoscopy with possible intervention   01/19/2024 8:46 AM  Corey [name]  has presented today for surgery, with the diagnosis of Chronic diarrhea (K52.9) Nausea and vomiting, unspecified vomiting type (R11.2).  The various methods of treatment have been discussed with the patient and family. After consideration of risks, benefits and other options for treatment, the patient has consented to  Procedure(s): COLONOSCOPY (N/A) EGD (ESOPHAGOGASTRODUODENOSCOPY) (N/A) as a surgical intervention.  The patient's history has been reviewed, patient examined, no change in status, stable for surgery.  I have reviewed the patient's chart and labs.  Questions were answered to the patient's satisfaction.     Quintin Buckle

## 2024-01-20 ENCOUNTER — Encounter: Payer: Self-pay | Admitting: Gastroenterology

## 2024-01-20 LAB — SURGICAL PATHOLOGY

## 2024-03-09 ENCOUNTER — Emergency Department
Admission: EM | Admit: 2024-03-09 | Discharge: 2024-03-09 | Disposition: A | Discharge status: Home / Self Care | Payer: MEDICAID | Attending: Emergency Medicine | Admitting: Emergency Medicine

## 2024-03-09 ENCOUNTER — Other Ambulatory Visit: Payer: Self-pay

## 2024-03-09 ENCOUNTER — Emergency Department: Payer: MEDICAID

## 2024-03-09 DIAGNOSIS — R0789 Other chest pain: Secondary | ICD-10-CM | POA: Diagnosis not present

## 2024-03-09 DIAGNOSIS — R079 Chest pain, unspecified: Secondary | ICD-10-CM | POA: Diagnosis present

## 2024-03-09 DIAGNOSIS — Z950 Presence of cardiac pacemaker: Secondary | ICD-10-CM | POA: Diagnosis not present

## 2024-03-09 LAB — BASIC METABOLIC PANEL WITH GFR
Anion gap: 11 (ref 5–15)
BUN: 20 mg/dL (ref 6–20)
CO2: 22 mmol/L (ref 22–32)
Calcium: 8.9 mg/dL (ref 8.9–10.3)
Chloride: 106 mmol/L (ref 98–111)
Creatinine, Ser: 1.1 mg/dL (ref 0.61–1.24)
GFR, Estimated: >60 mL/min (ref >60)
Glucose, Bld: 137 mg/dL — ABNORMAL HIGH (ref 70–99)
Potassium: 3.4 mmol/L — ABNORMAL LOW (ref 3.5–5.1)
Sodium: 139 mmol/L (ref 135–145)

## 2024-03-09 LAB — CBC
HCT: 43.1 % (ref 39.0–52.0)
Hemoglobin: 14.3 g/dL (ref 13.0–17.0)
MCH: 29.3 pg (ref 26.0–34.0)
MCHC: 33.2 g/dL (ref 30.0–36.0)
MCV: 88.3 fL (ref 80.0–100.0)
Platelets: 282 K/uL (ref 150–400)
RBC: 4.88 MIL/uL (ref 4.22–5.81)
RDW: 12.8 % (ref 11.5–15.5)
WBC: 14.9 K/uL — ABNORMAL HIGH (ref 4.0–10.5)
nRBC: 0 % (ref 0.0–0.2)

## 2024-03-09 LAB — TROPONIN I (HIGH SENSITIVITY)
Troponin I (High Sensitivity): <2 ng/L (ref <18)
Troponin I (High Sensitivity): 2 ng/L (ref <18)

## 2024-03-09 LAB — D-DIMER, QUANTITATIVE: D-Dimer, Quant: 0.39 ug{FEU}/mL (ref 0.00–0.50)

## 2024-03-09 MED ORDER — KETOROLAC TROMETHAMINE 30 MG/ML IJ SOLN
15.0000 mg | Freq: Once | INTRAMUSCULAR | Status: AC
  Administered 2024-03-09: 15 mg via INTRAVENOUS
  Filled 2024-03-09: qty 1

## 2024-03-09 NOTE — ED Triage Notes (Signed)
 Pt comes with right sided cp that started today and woke him up. Pt states he has pacemaker. Pt states some nausea but not sure if it is from his presidone for his gout.   Pt states sob off and on. Pt denies any radiation. pt states it feels like a knife.

## 2024-03-09 NOTE — ED Provider Notes (Signed)
 [facility] Provider Note    Event Date/Time   First MD Initiated Contact with Patient 03/09/24 1710     (approximate)   History   Chest Pain   HPI  Corey [name] is a 38 y.o. male who presents to the ED for evaluation of Chest Pain   Review a [facility] cardiology clinic visit from 7/24 due to SSS patient had a dual-chamber pacemaker placed 2019  Patient presents for evaluation of right-sided chest pain over the past 1 day.  Worsen taking a deep breath or touching the area.  Reports he was recently placed on prednisone  for gout flare to his lower extremities.  This is starting to improve.   Physical Exam   Triage Vital Signs: ED Triage Vitals  Encounter Vitals Group     BP 03/09/24 1604 (!) 143/93     Girls Systolic BP Percentile --      Girls Diastolic BP Percentile --      Boys Systolic BP Percentile --      Boys Diastolic BP Percentile --      Pulse Rate 03/09/24 1604 80     Resp 03/09/24 1604 18     Temp 03/09/24 1604 98 F (36.7 C)     Temp src --      SpO2 03/09/24 1604 100 %     Weight 03/09/24 1603 245 lb (111.1 kg)     Height 03/09/24 1603 5' 11 (1.803 m)     Head Circumference --      Peak Flow --      Pain Score 03/09/24 1603 7     Pain Loc --      Pain Education --      Exclude from Growth Chart --     Most recent vital signs: Vitals:   03/09/24 1604 03/09/24 1713  BP: (!) 143/93   Pulse: 80   Resp: 18   Temp: 98 F (36.7 C)   SpO2: 100% 100%    General: Awake, no distress.  CV:  Good peripheral perfusion.  Resp:  Normal effort.  Abd:  No distention.  MSK:  No deformity noted.  Reproducible chest pain on palpation without overlying skin changes or signs of trauma Neuro:  No focal deficits appreciated. Other:     ED Results / Procedures / Treatments   Labs (all labs ordered are listed, but only abnormal results are displayed) Labs Reviewed  BASIC METABOLIC PANEL WITH GFR - Abnormal; Notable for the  following components:      Result Value   Potassium 3.4 (*)    Glucose, Bld 137 (*)    All other components within normal limits  CBC - Abnormal; Notable for the following components:   WBC 14.9 (*)    All other components within normal limits  D-DIMER, QUANTITATIVE  TROPONIN I (HIGH SENSITIVITY)  TROPONIN I (HIGH SENSITIVITY)    EKG Sinus rhythm at a rate of 88 bpm.  Normal axis and intervals without clear signs of acute ischemia.  RADIOLOGY CXR interpreted by me without evidence of acute cardiopulmonary pathology.  Official radiology report(s): DG Chest 2 View Result Date: 03/09/2024 CLINICAL DATA:  Chest pain. EXAM: CHEST - 2 VIEW COMPARISON:  July 13, 2022. FINDINGS: The heart size and mediastinal contours are within normal limits. Both lungs are clear. The visualized skeletal structures are unremarkable. IMPRESSION: No active cardiopulmonary disease. Electronically Signed   By: Lynwood Landy Raddle M.D.   On: 03/09/2024 16:28  PROCEDURES and INTERVENTIONS:  .1-3 Lead EKG Interpretation  Performed by: Claudene Rover, MD Authorized by: Claudene Rover, MD     Interpretation: normal     ECG rate:  76   ECG rate assessment: normal     Rhythm: sinus rhythm     Ectopy: none     Conduction: normal   Comments:     Occasional atrially paced.   Medications  ketorolac  (TORADOL ) 30 MG/ML injection 15 mg (15 mg Intravenous Given 03/09/24 1849)     IMPRESSION / MDM / ASSESSMENT AND PLAN / ED COURSE  I reviewed the triage vital signs and the nursing notes.  Differential diagnosis includes, but is not limited to, ACS, PTX, PNA, muscle strain/spasm, PE, dissection, anxiety, pleural effusion  {Patient presents with symptoms of an acute illness or injury that is potentially life-threatening.  Patient presents with atypical chest discomfort of likely MSK etiology, with a benign workup and suitable for outpatient management.  No dysrhythmias on the monitor, nonischemic EKG within NSR.   Leukocytosis is noted.  Essentially normal metabolic panel.  Clinical Course as of 03/09/24 1944  Fri Mar 09, 2024  1942 Reassessed.  Feeling better.  No dysrhythmias on telemetry.  Occasionally atrially paced but no bradycardic rates.  No persistent symptoms.  2 negative troponins, negative D-dimer.  Leukocytosis is likely due to recent steroid course for gout. [DS]    Clinical Course User Index [DS] Claudene Rover, MD     FINAL CLINICAL IMPRESSION(S) / ED DIAGNOSES   Final diagnoses:  Other chest pain     Rx / DC Orders   ED Discharge Orders     None        Note:  This document was prepared using Dragon voice recognition software and may include unintentional dictation errors.   Claudene Rover, MD 03/09/24 [phone]

## 2024-03-09 NOTE — Discharge Instructions (Addendum)
Use Tylenol for pain and fevers.  Up to 1000 mg per dose, up to 4 times per day.  Do not take more than 4000 mg of Tylenol/acetaminophen within 24 hours..  

## 2024-03-17 ENCOUNTER — Emergency Department
Admission: EM | Admit: 2024-03-17 | Discharge: 2024-03-18 | Disposition: A | Discharge status: Home / Self Care | Payer: MEDICAID | Attending: Emergency Medicine | Admitting: Emergency Medicine

## 2024-03-17 ENCOUNTER — Other Ambulatory Visit: Payer: Self-pay

## 2024-03-17 ENCOUNTER — Emergency Department: Payer: MEDICAID

## 2024-03-17 ENCOUNTER — Encounter: Payer: Self-pay | Admitting: Intensive Care

## 2024-03-17 DIAGNOSIS — I10 Essential (primary) hypertension: Secondary | ICD-10-CM | POA: Diagnosis not present

## 2024-03-17 DIAGNOSIS — Z950 Presence of cardiac pacemaker: Secondary | ICD-10-CM | POA: Diagnosis not present

## 2024-03-17 DIAGNOSIS — R0789 Other chest pain: Secondary | ICD-10-CM | POA: Insufficient documentation

## 2024-03-17 DIAGNOSIS — R079 Chest pain, unspecified: Secondary | ICD-10-CM

## 2024-03-17 HISTORY — DX: Presence of cardiac pacemaker: Z95.0

## 2024-03-17 LAB — CBC
HCT: 45.5 % (ref 39.0–52.0)
Hemoglobin: 15.1 g/dL (ref 13.0–17.0)
MCH: 29.2 pg (ref 26.0–34.0)
MCHC: 33.2 g/dL (ref 30.0–36.0)
MCV: 88 fL (ref 80.0–100.0)
Platelets: 258 K/uL (ref 150–400)
RBC: 5.17 MIL/uL (ref 4.22–5.81)
RDW: 12.6 % (ref 11.5–15.5)
WBC: 10.1 K/uL (ref 4.0–10.5)
nRBC: 0 % (ref 0.0–0.2)

## 2024-03-17 LAB — BASIC METABOLIC PANEL WITH GFR
Anion gap: 8 (ref 5–15)
BUN: 21 mg/dL — ABNORMAL HIGH (ref 6–20)
CO2: 24 mmol/L (ref 22–32)
Calcium: 8.8 mg/dL — ABNORMAL LOW (ref 8.9–10.3)
Chloride: 105 mmol/L (ref 98–111)
Creatinine, Ser: 1.08 mg/dL (ref 0.61–1.24)
GFR, Estimated: >60 mL/min (ref >60)
Glucose, Bld: 138 mg/dL — ABNORMAL HIGH (ref 70–99)
Potassium: 3.9 mmol/L (ref 3.5–5.1)
Sodium: 137 mmol/L (ref 135–145)

## 2024-03-17 LAB — CBG MONITORING, ED: Glucose-Capillary: 125 mg/dL — ABNORMAL HIGH (ref 70–99)

## 2024-03-17 LAB — D-DIMER, QUANTITATIVE: D-Dimer, Quant: 0.33 ug{FEU}/mL (ref 0.00–0.50)

## 2024-03-17 LAB — TROPONIN I (HIGH SENSITIVITY): Troponin I (High Sensitivity): <2 ng/L (ref <18)

## 2024-03-17 MED ORDER — LIDOCAINE VISCOUS HCL 2 % MT SOLN
15.0000 mL | Freq: Once | OROMUCOSAL | Status: AC
  Administered 2024-03-17: 15 mL via ORAL
  Filled 2024-03-17: qty 15

## 2024-03-17 MED ORDER — ALUM & MAG HYDROXIDE-SIMETH 200-200-20 MG/5ML PO SUSP
30.0000 mL | Freq: Once | ORAL | Status: AC
  Administered 2024-03-17: 30 mL via ORAL
  Filled 2024-03-17: qty 30

## 2024-03-17 NOTE — ED Triage Notes (Signed)
 C/o chest pain that started today around 4:45pm. Reports tightness and stabbing feeling.   Has had recent echo but not given results yet.   Has pacemaker

## 2024-03-17 NOTE — ED Provider Notes (Signed)
  Physical Exam  BP 119/84 (BP Location: Right Arm)   Pulse 73   Temp 98.7 F (37.1 C) (Oral)   Resp 16   Ht 5' 11 (1.803 m)   Wt 111.1 kg   SpO2 97%   BMI 34.17 kg/m   Physical Exam  Procedures  Procedures  ED Course / MDM   Clinical Course as of 03/18/24 0111  Sat Mar 17, 2024  2322 Independent review of labs, electrolytes are severely deranged, no leukocytosis, troponin and D-dimer are not elevated. [TT]  2326 On reassessment patient states that he is feeling better after the GI cocktail, discussed with him about imaging lab results and that were still pending a repeat troponin.  Otherwise he can follow-up with his primary care doctor as well as cardiologist for outpatient follow-up. [TT]  2332 S/o from Dr. Waymond 73M hx SSS s/p pacemaker p/w epigastric pain/cp - initial workup wnl - rpt trop pending  If trop stable, rec DC [MM]  Sun Mar 18, 2024  0054 Rpt trop stable [MM]  0110 Patient reevaluated, notes all symptoms resolved after GI cocktail.  Presentation most consistent with likely dyspepsia.  Rx famotidine .  Reassured by unremarkable workup, doubt cardiac etiology at this time.  Recommend PMD follow-up.  Patient agrees with plan.  ED return cautions in place. [MM]    Clinical Course User Index [MM] [name], MD [TT] [name], MD   Medical Decision Making Amount and/or Complexity of Data Reviewed Labs: ordered. Radiology: ordered.  Risk OTC drugs. Prescription drug management.          [name], MD 03/18/24 0111

## 2024-03-17 NOTE — ED Provider Notes (Signed)
 [facility] Provider Note    Event Date/Time   First MD Initiated Contact with Patient 03/17/24 2022     (approximate)   History   Chest Pain   HPI  Corey [name] is a 38 y.o. male with history of bipolar disorder, symptomatic bradycardia, hypertension, schizoaffective disorder, has a pacemaker presenting for chest pain.  Started today around 445, reports some tightness and stabbing pain.  No radiation.  States it is worse when he takes a deep breath.  Similar to episode for which she was seen in early August in the ED.  He denies any shortness of breath, no near syncopal or syncopal episodes.  States that he had a mild cough, no nausea vomiting diarrhea, no fever or urinary symptoms.  States that he had his pacemaker checked in July and that there were no issues.  Has been compliant with his medications.  No unilateral counseling or tenderness, no recent travel or surgeries, no history of DVTs or malignancies.  No hormone use.  Per independent history from significant other, he had an EKG done recently at [facility] where he is followed by cardiology.  States that he had a recent pacemaker check and that was fine.  States that he does have history of GERD and acid reflux.   On independent chart review, he was seen by cardiology at the end of July, has history of sick sinus syndrome status post pacemaker placement.  Advised him to continue his current medications after pacemaker placement and to get interrogated twice a year.  Physical Exam   Triage Vital Signs: ED Triage Vitals  Encounter Vitals Group     BP 03/17/24 1843 (!) 135/95     Girls Systolic BP Percentile --      Girls Diastolic BP Percentile --      Boys Systolic BP Percentile --      Boys Diastolic BP Percentile --      Pulse Rate 03/17/24 1843 90     Resp 03/17/24 1843 16     Temp 03/17/24 1843 99 F (37.2 C)     Temp Source 03/17/24 1843 Oral     SpO2 03/17/24 1843 99 %     Weight 03/17/24 1839  245 lb (111.1 kg)     Height 03/17/24 1839 5' 11 (1.803 m)     Head Circumference --      Peak Flow --      Pain Score 03/17/24 1839 7     Pain Loc --      Pain Education --      Exclude from Growth Chart --     Most recent vital signs: Vitals:   03/17/24 2030 03/17/24 2130  BP: 125/88 119/84  Pulse: 80 73  Resp: 16 16  Temp:  98.7 F (37.1 C)  SpO2: 100% 97%     General: Awake, no distress.  CV:  Good peripheral perfusion.  Resp:  Normal effort.  No tachypnea or respiratory distress Abd:  No distention.  Soft nontender Other:  No unilateral casting or tenderness.  Nontoxic appearing.   ED Results / Procedures / Treatments   Labs (all labs ordered are listed, but only abnormal results are displayed) Labs Reviewed  BASIC METABOLIC PANEL WITH GFR - Abnormal; Notable for the following components:      Result Value   Glucose, Bld 138 (*)    BUN 21 (*)    Calcium  8.8 (*)    All other components within  normal limits  CBG MONITORING, ED - Abnormal; Notable for the following components:   Glucose-Capillary 125 (*)    All other components within normal limits  CBC  D-DIMER, QUANTITATIVE  TROPONIN I (HIGH SENSITIVITY)  TROPONIN I (HIGH SENSITIVITY)     EKG  EKG shows, sinus rhythm, rate 82, normal QS, normal QTc, no obvious ischemic ST elevation, T wave flattening in 3, not significant change compared to prior   RADIOLOGY On my independent interpretation, chest x-ray without obvious consolidation   PROCEDURES:  Critical Care performed: No  Procedures   MEDICATIONS ORDERED IN ED: Medications  alum & mag hydroxide-simeth (MAALOX/MYLANTA) 200-200-20 MG/5ML suspension 30 mL (30 mLs Oral Given 03/17/24 2059)    And  lidocaine  (XYLOCAINE ) 2 % viscous mouth solution 15 mL (15 mLs Oral Given 03/17/24 2059)     IMPRESSION / MDM / ASSESSMENT AND PLAN / ED COURSE  I reviewed the triage vital signs and the nursing notes.                               Differential diagnosis includes, but is not limited to, angina, ACS, acid reflux, GERD, viral illness, pneumonia, musculoskeletal pain, strain, consider PE but he has no other risk factors for it, he is not hypoxic, no unilateral calf pain or tenderness.  EKG, chest x-ray, labs.  Will give GI cocktail and reassess.  Patient's presentation is most consistent with acute presentation with potential threat to life or bodily function.  Independent interpretation of labs and imaging below.  On reassessment patient states that his pain is improving with the GI cocktail.  Discussed with him about waiting for repeat troponin, if negative likely will be able to discharge home with outpatient cardiology and PMD follow-up.  Shared decision making done with patient and family and they are agreeable with this plan.  The patient is on the cardiac monitor to evaluate for evidence of arrhythmia and/or significant heart rate changes.   Clinical Course as of 03/17/24 2345  Sat Mar 17, 2024  2322 Independent review of labs, electrolytes are severely deranged, no leukocytosis, troponin and D-dimer are not elevated. [TT]  2326 On reassessment patient states that he is feeling better after the GI cocktail, discussed with him about imaging lab results and that were still pending a repeat troponin.  Otherwise he can follow-up with his primary care doctor as well as cardiologist for outpatient follow-up. [TT]  2332 S/o from Dr. Waymond 67M hx SSS s/p pacemaker p/w epigastric pain/cp - initial workup wnl - rpt trop pending  If trop stable, rec DC [MM]    Clinical Course User Index [MM] [name], MD [TT] [name], MD     FINAL CLINICAL IMPRESSION(S) / ED DIAGNOSES   Final diagnoses:  Chest pain, unspecified type     Rx / DC Orders   ED Discharge Orders     None        Note:  This document was prepared using Dragon voice recognition software and may include unintentional dictation  errors.    [name], MD 03/17/24 [phone]

## 2024-03-17 NOTE — ED Notes (Signed)
 Introduced self to pt. Pt connected to monitoring devices. Pt given call bell. No further needs at this time.

## 2024-03-18 LAB — TROPONIN I (HIGH SENSITIVITY): Troponin I (High Sensitivity): <2 ng/L (ref <18)

## 2024-03-18 MED ORDER — FAMOTIDINE 20 MG PO TABS
20.0000 mg | ORAL_TABLET | Freq: Two times a day (BID) | ORAL | 0 refills | Status: AC
Start: 2024-03-18 — End: 2024-04-01

## 2024-03-18 NOTE — Discharge Instructions (Addendum)
 Your evaluation in the emergency department was overall reassuring.  Your heart testing was normal.  I suspect you may have irritation of the lining of your stomach, and have started you on an antacid.  Please follow-up with your primary care provider for reevaluation, and return to the emergency department with any new or worsening symptoms.

## 2024-03-18 NOTE — ED Notes (Signed)
 Pt given DC instructions. Pt verbalized understanding of medication and follow up care. Pt ambulatory from ED without difficulty. NAD noted at departure.

## 2024-07-10 ENCOUNTER — Ambulatory Visit: Payer: MEDICAID | Admitting: Urology

## 2024-07-10 DIAGNOSIS — R3915 Urgency of urination: Secondary | ICD-10-CM

## 2024-08-14 ENCOUNTER — Encounter: Payer: Self-pay | Admitting: Urology

## 2024-08-14 ENCOUNTER — Ambulatory Visit: Payer: MEDICAID | Admitting: Urology

## 2024-08-14 DIAGNOSIS — R3915 Urgency of urination: Secondary | ICD-10-CM

## 2024-09-25 ENCOUNTER — Ambulatory Visit: Payer: MEDICAID | Admitting: Urology
